# Patient Record
Sex: Female | Born: 1955
Health system: Southern US, Community
[De-identification: ages and names within clinical notes are randomized; demographics above are authoritative.]

## PROBLEM LIST (undated history)

## (undated) DIAGNOSIS — E785 Hyperlipidemia, unspecified: Secondary | ICD-10-CM

## (undated) DIAGNOSIS — M810 Age-related osteoporosis without current pathological fracture: Secondary | ICD-10-CM

## (undated) DIAGNOSIS — M797 Fibromyalgia: Secondary | ICD-10-CM

## (undated) DIAGNOSIS — F319 Bipolar disorder, unspecified: Secondary | ICD-10-CM

## (undated) DIAGNOSIS — E039 Hypothyroidism, unspecified: Secondary | ICD-10-CM

## (undated) DIAGNOSIS — F1111 Opioid abuse, in remission: Secondary | ICD-10-CM

## (undated) DIAGNOSIS — M545 Low back pain, unspecified: Secondary | ICD-10-CM

## (undated) DIAGNOSIS — M069 Rheumatoid arthritis, unspecified: Secondary | ICD-10-CM

## (undated) DIAGNOSIS — F419 Anxiety disorder, unspecified: Secondary | ICD-10-CM

## (undated) DIAGNOSIS — R160 Hepatomegaly, not elsewhere classified: Secondary | ICD-10-CM

## (undated) DIAGNOSIS — G20 Parkinson's disease: Secondary | ICD-10-CM

## (undated) DIAGNOSIS — N200 Calculus of kidney: Secondary | ICD-10-CM

## (undated) DIAGNOSIS — F603 Borderline personality disorder: Secondary | ICD-10-CM

## (undated) DIAGNOSIS — E236 Other disorders of pituitary gland: Secondary | ICD-10-CM

## (undated) DIAGNOSIS — K219 Gastro-esophageal reflux disease without esophagitis: Secondary | ICD-10-CM

## (undated) DIAGNOSIS — M1990 Unspecified osteoarthritis, unspecified site: Secondary | ICD-10-CM

## (undated) DIAGNOSIS — I2510 Atherosclerotic heart disease of native coronary artery without angina pectoris: Secondary | ICD-10-CM

## (undated) DIAGNOSIS — M419 Scoliosis, unspecified: Secondary | ICD-10-CM

## (undated) DIAGNOSIS — F329 Major depressive disorder, single episode, unspecified: Secondary | ICD-10-CM

## (undated) DIAGNOSIS — I10 Essential (primary) hypertension: Secondary | ICD-10-CM

## (undated) HISTORY — PX: BLADDER SURGERY: SHX569

## (undated) HISTORY — DX: Essential (primary) hypertension: I10

## (undated) HISTORY — DX: Atherosclerotic heart disease of native coronary artery without angina pectoris: I25.10

## (undated) HISTORY — DX: Low back pain: M54.5

## (undated) HISTORY — DX: Hyperlipidemia, unspecified: E78.5

## (undated) HISTORY — PX: OTHER SURGICAL HISTORY: SHX169

## (undated) HISTORY — DX: Other disorders of pituitary gland: E23.6

## (undated) HISTORY — PX: TONSILLECTOMY: SUR1361

## (undated) HISTORY — DX: Scoliosis, unspecified: M41.9

## (undated) HISTORY — DX: Age-related osteoporosis without current pathological fracture: M81.0

## (undated) HISTORY — DX: Calculus of kidney: N20.0

## (undated) HISTORY — DX: Opioid abuse, in remission: F11.11

## (undated) HISTORY — PX: ESOPHAGEAL DILATION: SHX303

## (undated) HISTORY — DX: Hepatomegaly, not elsewhere classified: R16.0

## (undated) HISTORY — DX: Fibromyalgia: M79.7

## (undated) HISTORY — PX: CARDIAC CATHETERIZATION: SHX172

## (undated) HISTORY — DX: Hypothyroidism, unspecified: E03.9

## (undated) HISTORY — PX: VAGINAL HYSTERECTOMY: SUR661

## (undated) HISTORY — DX: Gastro-esophageal reflux disease without esophagitis: K21.9

## (undated) HISTORY — PX: COLONOSCOPY: SHX174

## (undated) HISTORY — DX: Anxiety disorder, unspecified: F41.9

## (undated) HISTORY — DX: Rheumatoid arthritis, unspecified: M06.9

## (undated) HISTORY — DX: Low back pain, unspecified: M54.50

## (undated) HISTORY — DX: Major depressive disorder, single episode, unspecified: F32.9

## (undated) HISTORY — DX: Bipolar disorder, unspecified: F31.9

## (undated) HISTORY — PX: CATARACT EXTRACTION, BILATERAL: SHX1313

## (undated) HISTORY — DX: Unspecified osteoarthritis, unspecified site: M19.90

## (undated) HISTORY — DX: Parkinson's disease: G20

## (undated) HISTORY — DX: Borderline personality disorder: F60.3

---

## 2001-05-18 ENCOUNTER — Encounter (INDEPENDENT_AMBULATORY_CARE_PROVIDER_SITE_OTHER): Payer: Self-pay | Admitting: Specialist

## 2001-05-18 ENCOUNTER — Observation Stay (HOSPITAL_COMMUNITY): Admission: RE | Admit: 2001-05-18 | Discharge: 2001-05-19 | Payer: Self-pay | Admitting: *Deleted

## 2003-02-28 ENCOUNTER — Other Ambulatory Visit: Payer: Self-pay

## 2003-04-06 ENCOUNTER — Inpatient Hospital Stay (HOSPITAL_COMMUNITY): Admission: AD | Admit: 2003-04-06 | Discharge: 2003-04-08 | Payer: Self-pay | Admitting: Cardiology

## 2003-09-12 ENCOUNTER — Inpatient Hospital Stay (HOSPITAL_COMMUNITY): Admission: RE | Admit: 2003-09-12 | Discharge: 2003-09-20 | Payer: Self-pay | Admitting: Psychiatry

## 2003-09-21 ENCOUNTER — Other Ambulatory Visit (HOSPITAL_COMMUNITY): Admission: RE | Admit: 2003-09-21 | Discharge: 2003-12-20 | Payer: Self-pay | Admitting: Psychiatry

## 2003-10-09 ENCOUNTER — Inpatient Hospital Stay (HOSPITAL_COMMUNITY): Admission: RE | Admit: 2003-10-09 | Discharge: 2003-10-15 | Payer: Self-pay | Admitting: Psychiatry

## 2004-04-12 ENCOUNTER — Ambulatory Visit (HOSPITAL_COMMUNITY): Admission: RE | Admit: 2004-04-12 | Discharge: 2004-04-12 | Payer: Self-pay

## 2004-05-06 ENCOUNTER — Ambulatory Visit (HOSPITAL_COMMUNITY): Admission: RE | Admit: 2004-05-06 | Discharge: 2004-05-06 | Payer: Self-pay | Admitting: *Deleted

## 2004-06-10 ENCOUNTER — Inpatient Hospital Stay: Payer: Self-pay | Admitting: Anesthesiology

## 2004-06-10 ENCOUNTER — Other Ambulatory Visit: Payer: Self-pay

## 2004-08-23 ENCOUNTER — Emergency Department: Payer: Self-pay | Admitting: General Practice

## 2005-08-12 ENCOUNTER — Other Ambulatory Visit: Payer: Self-pay

## 2005-08-13 ENCOUNTER — Inpatient Hospital Stay: Payer: Self-pay

## 2005-09-01 ENCOUNTER — Ambulatory Visit: Payer: Self-pay | Admitting: Gastroenterology

## 2005-09-24 ENCOUNTER — Encounter: Payer: Self-pay | Admitting: Internal Medicine

## 2006-10-17 ENCOUNTER — Emergency Department: Payer: Self-pay | Admitting: Emergency Medicine

## 2007-10-21 ENCOUNTER — Emergency Department: Payer: Self-pay | Admitting: Emergency Medicine

## 2008-05-01 ENCOUNTER — Encounter: Payer: Self-pay | Admitting: Family Medicine

## 2008-05-24 ENCOUNTER — Ambulatory Visit: Payer: Self-pay | Admitting: Cardiology

## 2008-06-09 ENCOUNTER — Encounter: Payer: Self-pay | Admitting: Family Medicine

## 2008-06-22 ENCOUNTER — Encounter: Payer: Self-pay | Admitting: Family Medicine

## 2008-06-29 ENCOUNTER — Encounter: Payer: Self-pay | Admitting: Family Medicine

## 2008-07-25 ENCOUNTER — Encounter: Payer: Self-pay | Admitting: Family Medicine

## 2008-08-31 ENCOUNTER — Encounter: Payer: Self-pay | Admitting: Family Medicine

## 2008-10-25 ENCOUNTER — Emergency Department (HOSPITAL_COMMUNITY): Admission: EM | Admit: 2008-10-25 | Discharge: 2008-10-25 | Payer: Self-pay | Admitting: Emergency Medicine

## 2008-10-28 ENCOUNTER — Inpatient Hospital Stay: Payer: Self-pay | Admitting: Unknown Physician Specialty

## 2009-02-03 ENCOUNTER — Ambulatory Visit: Payer: Self-pay | Admitting: Gastroenterology

## 2009-03-06 ENCOUNTER — Ambulatory Visit: Payer: Self-pay | Admitting: Gastroenterology

## 2009-06-09 ENCOUNTER — Encounter: Payer: Self-pay | Admitting: Family Medicine

## 2009-06-26 ENCOUNTER — Encounter: Payer: Self-pay | Admitting: Family Medicine

## 2009-08-24 ENCOUNTER — Encounter: Payer: Self-pay | Admitting: Family Medicine

## 2009-11-26 ENCOUNTER — Ambulatory Visit: Payer: Self-pay | Admitting: Family Medicine

## 2009-11-26 DIAGNOSIS — M545 Low back pain: Secondary | ICD-10-CM

## 2009-11-26 DIAGNOSIS — I2510 Atherosclerotic heart disease of native coronary artery without angina pectoris: Secondary | ICD-10-CM

## 2009-11-26 DIAGNOSIS — F319 Bipolar disorder, unspecified: Secondary | ICD-10-CM | POA: Insufficient documentation

## 2009-11-26 DIAGNOSIS — I10 Essential (primary) hypertension: Secondary | ICD-10-CM | POA: Insufficient documentation

## 2009-11-26 DIAGNOSIS — Z87442 Personal history of urinary calculi: Secondary | ICD-10-CM

## 2009-11-26 DIAGNOSIS — E039 Hypothyroidism, unspecified: Secondary | ICD-10-CM | POA: Insufficient documentation

## 2009-11-26 DIAGNOSIS — E1169 Type 2 diabetes mellitus with other specified complication: Secondary | ICD-10-CM

## 2009-11-26 DIAGNOSIS — E1140 Type 2 diabetes mellitus with diabetic neuropathy, unspecified: Secondary | ICD-10-CM

## 2009-11-26 DIAGNOSIS — F411 Generalized anxiety disorder: Secondary | ICD-10-CM

## 2009-11-26 DIAGNOSIS — E785 Hyperlipidemia, unspecified: Secondary | ICD-10-CM

## 2009-11-26 DIAGNOSIS — F410 Panic disorder [episodic paroxysmal anxiety] without agoraphobia: Secondary | ICD-10-CM

## 2009-11-26 HISTORY — DX: Hypothyroidism, unspecified: E03.9

## 2009-11-26 HISTORY — DX: Bipolar disorder, unspecified: F31.9

## 2009-11-26 HISTORY — DX: Panic disorder (episodic paroxysmal anxiety): F41.0

## 2009-11-26 HISTORY — DX: Atherosclerotic heart disease of native coronary artery without angina pectoris: I25.10

## 2009-11-26 HISTORY — DX: Essential (primary) hypertension: I10

## 2009-11-28 LAB — CONVERTED CEMR LAB
ALT: 10 units/L (ref 0–35)
AST: 11 units/L (ref 0–37)
Albumin: 3.7 g/dL (ref 3.5–5.2)
Alkaline Phosphatase: 89 units/L (ref 39–117)
BUN: 11 mg/dL (ref 6–23)
Bilirubin, Direct: 0.1 mg/dL (ref 0.0–0.3)
CO2: 32 meq/L (ref 19–32)
Calcium: 9.2 mg/dL (ref 8.4–10.5)
Chloride: 98 meq/L (ref 96–112)
Cholesterol: 203 mg/dL — ABNORMAL HIGH (ref 0–200)
Creatinine, Ser: 0.9 mg/dL (ref 0.4–1.2)
Direct LDL: 98.1 mg/dL
GFR calc non Af Amer: 69.61 mL/min (ref >60)
Glucose, Bld: 221 mg/dL — ABNORMAL HIGH (ref 70–99)
HDL: 46.6 mg/dL (ref >39.00)
Hgb A1c MFr Bld: 8.5 % — ABNORMAL HIGH (ref 4.6–6.5)
Potassium: 4.6 meq/L (ref 3.5–5.1)
Sodium: 141 meq/L (ref 135–145)
TSH: 0.03 microintl units/mL — ABNORMAL LOW (ref 0.35–5.50)
Total Bilirubin: 0.4 mg/dL (ref 0.3–1.2)
Total CHOL/HDL Ratio: 4
Total Protein: 6.7 g/dL (ref 6.0–8.3)
Triglycerides: 539 mg/dL — ABNORMAL HIGH (ref 0.0–149.0)
VLDL: 107.8 mg/dL — ABNORMAL HIGH (ref 0.0–40.0)

## 2009-12-20 ENCOUNTER — Telehealth: Payer: Self-pay | Admitting: Family Medicine

## 2010-01-01 ENCOUNTER — Encounter (INDEPENDENT_AMBULATORY_CARE_PROVIDER_SITE_OTHER): Payer: Self-pay | Admitting: *Deleted

## 2010-01-01 ENCOUNTER — Ambulatory Visit: Payer: Self-pay | Admitting: Family Medicine

## 2010-01-01 DIAGNOSIS — R300 Dysuria: Secondary | ICD-10-CM

## 2010-01-01 LAB — CONVERTED CEMR LAB
Bilirubin Urine: NEGATIVE
Blood in Urine, dipstick: NEGATIVE
Glucose, Urine, Semiquant: NEGATIVE
Nitrite: NEGATIVE
Specific Gravity, Urine: 1.01
Urobilinogen, UA: 0.2
WBC Urine, dipstick: NEGATIVE
pH: 8.5

## 2010-01-02 ENCOUNTER — Encounter: Payer: Self-pay | Admitting: Family Medicine

## 2010-01-02 LAB — CONVERTED CEMR LAB
ALT: 14 units/L (ref 0–35)
AST: 15 units/L (ref 0–37)
Albumin: 3.7 g/dL (ref 3.5–5.2)
Alkaline Phosphatase: 77 units/L (ref 39–117)
BUN: 12 mg/dL (ref 6–23)
Bilirubin, Direct: 0.1 mg/dL (ref 0.0–0.3)
CO2: 36 meq/L — ABNORMAL HIGH (ref 19–32)
Calcium: 9.3 mg/dL (ref 8.4–10.5)
Chloride: 96 meq/L (ref 96–112)
Cholesterol: 126 mg/dL (ref 0–200)
Creatinine, Ser: 0.8 mg/dL (ref 0.4–1.2)
Direct LDL: 47.6 mg/dL
GFR calc non Af Amer: 76.49 mL/min (ref >60)
Glucose, Bld: 208 mg/dL — ABNORMAL HIGH (ref 70–99)
HDL: 36.7 mg/dL — ABNORMAL LOW (ref >39.00)
Hgb A1c MFr Bld: 9 % — ABNORMAL HIGH (ref 4.6–6.5)
Potassium: 4 meq/L (ref 3.5–5.1)
Sodium: 143 meq/L (ref 135–145)
TSH: 0.26 microintl units/mL — ABNORMAL LOW (ref 0.35–5.50)
Total Bilirubin: 0.5 mg/dL (ref 0.3–1.2)
Total CHOL/HDL Ratio: 3
Total Protein: 6.6 g/dL (ref 6.0–8.3)
Triglycerides: 394 mg/dL — ABNORMAL HIGH (ref 0.0–149.0)
VLDL: 78.8 mg/dL — ABNORMAL HIGH (ref 0.0–40.0)

## 2010-01-09 ENCOUNTER — Encounter (INDEPENDENT_AMBULATORY_CARE_PROVIDER_SITE_OTHER): Payer: Self-pay | Admitting: *Deleted

## 2010-01-10 ENCOUNTER — Encounter (INDEPENDENT_AMBULATORY_CARE_PROVIDER_SITE_OTHER): Payer: Self-pay | Admitting: *Deleted

## 2010-01-10 ENCOUNTER — Telehealth: Payer: Self-pay | Admitting: Family Medicine

## 2010-01-17 ENCOUNTER — Telehealth: Payer: Self-pay | Admitting: Family Medicine

## 2010-01-28 ENCOUNTER — Telehealth: Payer: Self-pay | Admitting: Family Medicine

## 2010-02-04 ENCOUNTER — Ambulatory Visit: Payer: Self-pay | Admitting: Family Medicine

## 2010-02-18 ENCOUNTER — Encounter (INDEPENDENT_AMBULATORY_CARE_PROVIDER_SITE_OTHER): Payer: Self-pay | Admitting: *Deleted

## 2010-02-21 ENCOUNTER — Ambulatory Visit: Payer: Self-pay | Admitting: Family Medicine

## 2010-02-21 ENCOUNTER — Ambulatory Visit: Payer: Self-pay | Admitting: Internal Medicine

## 2010-02-21 ENCOUNTER — Telehealth: Payer: Self-pay | Admitting: Internal Medicine

## 2010-02-21 DIAGNOSIS — G894 Chronic pain syndrome: Secondary | ICD-10-CM | POA: Insufficient documentation

## 2010-02-21 HISTORY — DX: Chronic pain syndrome: G89.4

## 2010-02-22 ENCOUNTER — Encounter (INDEPENDENT_AMBULATORY_CARE_PROVIDER_SITE_OTHER): Payer: Self-pay | Admitting: *Deleted

## 2010-02-22 ENCOUNTER — Telehealth: Payer: Self-pay | Admitting: Family Medicine

## 2010-02-22 LAB — CONVERTED CEMR LAB
ALT: 18 units/L (ref 0–35)
AST: 19 units/L (ref 0–37)
Albumin: 4.2 g/dL (ref 3.5–5.2)
Alkaline Phosphatase: 48 units/L (ref 39–117)
Bilirubin, Direct: 0.1 mg/dL (ref 0.0–0.3)
Cholesterol: 251 mg/dL — ABNORMAL HIGH (ref 0–200)
Direct LDL: 156.2 mg/dL
HDL: 47.5 mg/dL (ref >39.00)
Hgb A1c MFr Bld: 8 % — ABNORMAL HIGH (ref 4.6–6.5)
Total Bilirubin: 0.6 mg/dL (ref 0.3–1.2)
Total CHOL/HDL Ratio: 5
Total Protein: 7.3 g/dL (ref 6.0–8.3)
Triglycerides: 361 mg/dL — ABNORMAL HIGH (ref 0.0–149.0)
VLDL: 72.2 mg/dL — ABNORMAL HIGH (ref 0.0–40.0)

## 2010-02-27 ENCOUNTER — Telehealth: Payer: Self-pay | Admitting: Family Medicine

## 2010-03-13 ENCOUNTER — Encounter: Payer: Self-pay | Admitting: Internal Medicine

## 2010-03-13 ENCOUNTER — Telehealth: Payer: Self-pay | Admitting: Internal Medicine

## 2010-03-20 ENCOUNTER — Telehealth: Payer: Self-pay | Admitting: Family Medicine

## 2010-04-04 ENCOUNTER — Ambulatory Visit: Payer: Self-pay | Admitting: Internal Medicine

## 2010-04-08 ENCOUNTER — Telehealth: Payer: Self-pay | Admitting: Family Medicine

## 2010-04-18 ENCOUNTER — Telehealth: Payer: Self-pay | Admitting: Family Medicine

## 2010-05-06 ENCOUNTER — Telehealth: Payer: Self-pay | Admitting: Family Medicine

## 2010-05-11 ENCOUNTER — Encounter: Payer: Self-pay | Admitting: Neurology

## 2010-05-21 ENCOUNTER — Telehealth: Payer: Self-pay | Admitting: Family Medicine

## 2010-05-23 NOTE — Letter (Signed)
Summary: [facility]  [facility]   Imported By: Maryln Gottron 02/08/2010 15:19:10  _____________________________________________________________________  External Attachment:    Type:   Image     Comment:   External Document

## 2010-05-23 NOTE — Miscellaneous (Signed)
Summary: Colonoscopy 09/24/2005 1+ adenoma  Clinical Lists Changes  Observations: Added new observation of COLONNXTDUE: 09/2010 (03/13/2010 16:18) Added new observation of COLONRECACT: Repeat colonoscopy in 5 years. Would use deep sedation with propofol   (09/24/2005 16:21) Added new observation of COLONOSCOPY: 4 mm transverse polyp 3 mm sigmoid polyp tortuous colon  at least one polyp was an ADENOMA  175 micrograms Fentanyl 8 mg midazolam  Dr,. Herfarth [facility]  Patient's name Lori Phillips then  (09/24/2005 16:21)      Colonoscopy  Procedure date:  09/24/2005  Findings:      4 mm transverse polyp 3 mm sigmoid polyp tortuous colon  at least one polyp was an ADENOMA  175 micrograms Fentanyl 8 mg midazolam  Dr,. Herf[facility]  Patient's name Lori Phillips then   Comments:      Repeat colonoscopy in 5 years. Would use deep sedation with propofol    Procedures Next Due Date:    Colonoscopy: 09/2010

## 2010-05-23 NOTE — Progress Notes (Signed)
Summary: Rx Hydrocodone/APAP  Phone Note Refill Request Call back at [phone] Message from:  [facility] on April 08, 2010 12:49 PM  Refills Requested: Medication #1:  VICODIN 5-500 MG TABS 1 tab by mouth every 6 hours as needed for severe pain.   Last Refilled: 03/20/2010 Received E-script request please advise.   Method Requested: Telephone to Pharmacy Initial call taken by: Linde Gillis CMA [facility]),  April 08, 2010 12:49 PM  Follow-up for Phone Call        Rx called to pharmacy Follow-up by: Linde Gillis CMA [facility]),  April 09, 2010 8:08 AM    Prescriptions: VICODIN 5-500 MG TABS (HYDROCODONE-ACETAMINOPHEN) 1 tab by mouth every 6 hours as needed for severe pain.  #60 x 0   Entered and Authorized by:   [name] MD   Signed by:   [name] MD on 04/09/2010   Method used:   Telephoned to ...         RxID:   [id number]

## 2010-05-23 NOTE — Progress Notes (Signed)
Summary: Rx Tramadol  Phone Note Refill Request Call back at [phone] Message from:  [facility] on January 28, 2010 8:17 AM  Refills Requested: Medication #1:  TRAMADOL HCL 50 MG  TABS 1-2  tab by mouth 4  times a day as needed pain   Last Refilled: 01/01/2010 Received E-script request please advise.   Method Requested: Telephone to Pharmacy Initial call taken by: Linde Gillis CMA [facility] Dull),  January 28, 2010 8:18 AM    Prescriptions: TRAMADOL HCL 50 MG  TABS (TRAMADOL HCL) 1-2  tab by mouth 4  times a day as needed pain  #240 x 0   Entered and Authorized by:   [name] MD   Signed by:   [name] MD on 01/28/2010   Method used:   Electronically to        [facility]. # [phone]* (retail)       [address]       [place], [place]  [address]       Ph: [phone]       Fax: [phone]   RxID:   [id number]

## 2010-05-23 NOTE — Miscellaneous (Signed)
Summary: Controlled Substances Contract  Controlled Substances Contract   Imported By: Maryln Gottron 02/25/2010 14:11:25  _____________________________________________________________________  External Attachment:    Type:   Image     Comment:   External Document

## 2010-05-23 NOTE — Procedures (Signed)
Summary: Colonoscopy/Hans Herfarth MD -[facility]  Colonoscopy/Hans Herfarth MD -[facility]   Imported By: Sherian Rein 03/20/2010 11:24:24  _____________________________________________________________________  External Attachment:    Type:   Image     Comment:   External Document

## 2010-05-23 NOTE — Letter (Signed)
Summary: Pre Visit Letter Revised  [facility]  [address] [place], [place] [address]   Phone: [phone]  Fax: [phone]        01/01/2010 MRN: [id number] [facility] [address] [place], [place]  [address]                Procedure Date:  01-23-10  Welcome to the Gastroenterology Division at [facility].    You are scheduled to see a nurse for your pre-procedure visit on 01-09-10 at 10:00a.m. on the 3rd floor at [facility], [address]  We ask that you try to arrive at our office 15 minutes prior to your appointment time to allow for check-in.  Please take a minute to review the attached form.  If you answer "Yes" to one or more of the questions on the first page, we ask that you call the person listed at your earliest opportunity.  If you answer "No" to all of the questions, please complete the rest of the form and bring it to your appointment.    Your nurse visit will consist of discussing your medical and surgical history, your immediate family medical history, and your medications.   If you are unable to list all of your medications on the form, please bring the medication bottles to your appointment and we will list them.  We will need to be aware of both prescribed and over the counter drugs.  We will need to know exact dosage information as well.    Please be prepared to read and sign documents such as consent forms, a financial agreement, and acknowledgement forms.  If necessary, and with your consent, a friend or relative is welcome to sit-in on the nurse visit with you.  Please bring your insurance card so that we may make a copy of it.  If your insurance requires a referral to see a specialist, please bring your referral form from your primary care physician.  No co-pay is required for this nurse visit.     If you cannot keep your appointment, please call [phone] to cancel or reschedule prior to your appointment date.  This  allows [place] the opportunity to schedule an appointment for another patient in need of care.    Thank you for choosing [facility] for your medical needs.  We appreciate the opportunity to care for you.  Please visit [place] at our website  to learn more about our practice.  Sincerely, The Gastroenterology Division

## 2010-05-23 NOTE — Letter (Signed)
Summary: [facility] Instructions  [facility]  [address] [place], [place] [address]   Phone: [phone]  Fax: [phone]       Lori Phillips    [birth year]    MRN: [id number]        Procedure Day /Date:  Thursday 04-07-10     Arrival Time:  10:30 am      Procedure Time: 11:30 am     Location of Procedure:                    _x _  [facility] (4th Floor)                        PREPARATION FOR COLONOSCOPY WITH MOVIPREP   Starting 5 days prior to your procedure 03-30-10 do not eat nuts, seeds, popcorn, corn, beans, peas,  salads, or any raw vegetables.  Do not take any fiber supplements (e.g. Metamucil, Citrucel, and Benefiber).  THE DAY BEFORE YOUR PROCEDURE         DATE: Wednesday 04-03-10  1.  Drink clear liquids the entire day-NO SOLID FOOD  2.  Do not drink anything colored red or purple.  Avoid juices with pulp.  No orange juice.  3.  Drink at least 64 oz. (8 glasses) of fluid/clear liquids during the day to prevent dehydration and help the prep work efficiently.  CLEAR LIQUIDS INCLUDE: Water Jello Ice Popsicles Tea (sugar ok, no milk/cream) Powdered fruit flavored drinks Coffee (sugar ok, no milk/cream) Gatorade Juice: apple, white grape, white cranberry  Lemonade Clear bullion, consomm, broth Carbonated beverages (any kind) Strained chicken noodle soup Hard Candy                             4.  In the morning, mix first dose of MoviPrep solution:    Empty 1 Pouch A and 1 Pouch B into the disposable container    Add lukewarm drinking water to the top line of the container. Mix to dissolve    Refrigerate (mixed solution should be used within 24 hrs)  5.  Begin drinking the prep at 5:00 p.m. The MoviPrep container is divided by 4 marks.   Every 15 minutes drink the solution down to the next mark (approximately 8 oz) until the full liter is complete.   6.  Follow completed prep with 16 oz of clear liquid of your choice  (Nothing red or purple).  Continue to drink clear liquids until bedtime.  7.  Before going to bed, mix second dose of MoviPrep solution:    Empty 1 Pouch A and 1 Pouch B into the disposable container    Add lukewarm drinking water to the top line of the container. Mix to dissolve    Refrigerate  THE DAY OF YOUR PROCEDURE      DATE: Thursday, 04-04-10  Beginning at 6:30 am (5 hours before procedure):         1. Every 15 minutes, drink the solution down to the next mark (approx 8 oz) until the full liter is complete.  2. Follow completed prep with 16 oz. of clear liquid of your choice.    3. You may drink clear liquids until 9:30 am (2 HOURS BEFORE PROCEDURE).   MEDICATION INSTRUCTIONS  Unless otherwise instructed, you should take regular prescription medications with a small sip of water   as early as possible the morning of  your procedure.  Diabetic patients - see separate instructions.           OTHER INSTRUCTIONS  You will need a responsible adult at least 57 years of age to accompany you and drive you home.   This person must remain in the waiting room during your procedure.  Wear loose fitting clothing that is easily removed.  Leave jewelry and other valuables at home.  However, you may wish to bring a book to read or  an iPod/MP3 player to listen to music as you wait for your procedure to start.  Remove all body piercing jewelry and leave at home.  Total time from sign-in until discharge is approximately 2-3 hours.  You should go home directly after your procedure and rest.  You can resume normal activities the  day after your procedure.  The day of your procedure you should not:   Drive   Make legal decisions   Operate machinery   Drink alcohol   Return to work  You will receive specific instructions about eating, activities and medications before you leave.    The above instructions have been reviewed and explained to me by  Karl Bales  RN  February 21, 2010 11:04 AM     I fully understand and can verbalize these instructions _____________________________ Date _________

## 2010-05-23 NOTE — Miscellaneous (Signed)
Summary: [facility] previsit  Clinical Lists Changes  Medications: Added new medication of MOVIPREP 100 GM  SOLR (PEG-KCL-NACL-NASULF-NA ASC-C) As per prep instructions. - Signed Rx of MOVIPREP 100 GM  SOLR (PEG-KCL-NACL-NASULF-NA ASC-C) As per prep instructions.;  #1 x 0;  Signed;  Entered by: Karl Bales RN;  Authorized by: Iva Boop MD, [facility];  Method used: Electronically to [facility]. # [phone]*, [address] [place], [place], [place]  [address], Ph: [phone], Fax: [phone] Allergies: Added new allergy or adverse reaction of * SEROQUEL    Prescriptions: MOVIPREP 100 GM  SOLR (PEG-KCL-NACL-NASULF-NA ASC-C) As per prep instructions.  #1 x 0   Entered by:   Karl Bales RN   Authorized by:   Iva Boop MD, [facility]   Signed by:   Karl Bales RN on 02/21/2010   Method used:   Electronically to        [facility]. # [phone]* (retail)       [address]       [place], [place]  [address]       Ph: [phone]       Fax: [phone]   RxID:   [id number]

## 2010-05-23 NOTE — Progress Notes (Signed)
Summary: refill request for tramadol  Phone Note Refill Request Message from:  Fax from Pharmacy  Refills Requested: Medication #1:  TRAMADOL HCL 50 MG  TABS 1-2  tab by mouth 4  times a day as needed pain   Last Refilled: 01/28/2010 [facility]axed [facility] from [facility], [phone].  Initial call taken by: Lowella Petties CMA, AAMA,  February 27, 2010 4:47 PM    Prescriptions: TRAMADOL HCL 50 MG  TABS (TRAMADOL HCL) 1-2  tab by mouth 4  times a day as needed pain  #240 x 0   Entered and Authorized by:   [name] MD   Signed by:   [name] MD on 02/28/2010   Method used:   Electronically to        [facility]. # [phone]* (retail)       [address]       [place], [place]  [address]       Ph: [phone]       Fax: [phone]   RxID:   [id number] TRAMADOL HCL 50 MG  TABS (TRAMADOL HCL) 1-2  tab by mouth 4  times a day as needed pain  #240 x 0   Entered and Authorized by:   [name] MD   Signed by:   [name] MD on 02/28/2010   Method used:   Telephoned to ...         RxID:   [id number]

## 2010-05-23 NOTE — Letter (Signed)
Summary: Diabetic Instructions  [facility]  [address] [place], [place] [address]   Phone: [phone]  Fax: [phone]    Lori Phillips Jul 09, 1955 MRN: [id number]   x     ORAL DIABETIC MEDICATION INSTRUCTIONS  The day before your procedure:   Take your diabetic pill as you do normally  The day of your procedure:   Do not take your diabetic pill    We will check your blood sugar levels during the admission process and again in Recovery before discharging you home  ________________________________________________________________________  x     INSULIN (LONG ACTING) MEDICATION INSTRUCTIONS (Lantus, NPH, 70/30, Humulin, Novolin-N)   The day before your procedure:   Take  your regular evening dose    The day of your procedure:   Do not take your morning dose

## 2010-05-23 NOTE — Letter (Signed)
Summary: Pre Visit No Show Letter  [facility]  [address] [place], [place] [address]   Phone: [phone]  Fax: [phone]        January 09, 2010 MRN: [id number]    [facility] [address] [place], [place]  [address]    Dear Lori Phillips,   We have been unable to reach you by phone concerning the pre-procedure visit that you missed on 01/09/10. For this reason,your procedure scheduled on 01/23/10 has been cancelled. Our scheduling staff will gladly assist you with rescheduling your appointments at a more convenient time. Please call our office at [phone] between the hours of 8:00am and 5:00pm, press option #2 to reach an appointment scheduler. Please consider updating your contact numbers at this time so that we can reach you by phone in the future with schedule changes or results.    Thank you,    Wyona Almas RN [facility]

## 2010-05-23 NOTE — Assessment & Plan Note (Signed)
Summary: NEW MEDICARE PT TO ESTABH/DLO   Vital Signs:  Patient profile:   55 year old female Height:      66.5 inches Weight:      191.25 pounds BMI:     30.52 Temp:     97.7 degrees F oral Pulse rate:   72 / minute Pulse rhythm:   regular BP sitting:   120 / 82  (left arm) Cuff size:   regular  Vitals Entered By: Linde Gillis CMA [facility]) (November 26, 2009 9:42 AM) CC: new medicare to establish  Vision Screening:      Vision Comments: wears glasses  Vision Entered By: Linde Gillis CMA (AAMA) (November 26, 2009 10:00 AM)  Hearing Screen  20db HL: Left  500 hz: 20db 1000 hz: 20db 2000 hz: 20db 4000 hz: 20db Right  500 hz: 20db 1000 hz: 20db 2000 hz: 20db 4000 hz: 20db   Hearing Testing Entered By: Linde Gillis CMA [facility]ull) (November 26, 2009 10:00 AM)   History of Present Illness: 55 yo here to establish care.  DM- does not check her CBGs regulary.  Currently on Glipize-Metformin 2.5/500 mg- 2 tabs two times a day.  Denies any episodes of hypoglycemic symptoms.  Unsure when last a1c was but brings in labs from 06/2009 when a1c was 9.0.  Hypothyroidism- on Levothyroxiine 250 micrograms daily.  Denies any symptoms of hypo or hypothyroidism,  TSH on March 2011 was 0.815.  HTN- has been well controlled on Lisinopril 5 mg daily and Diltiazam 180 mg at bedtime.   ?h/o CAD- has had multiple cardiac catheterizations which have been negative.  Has been placed on diltiazem to help with possible spasm, which she feels has helped.  Denies any recent episodes of CP or SOB>  Bipolar disorder- well controlled on Depadoate ER 500 mg two times a day. Has not had a manic episodes in 4-5 years and has not seen a psychiatrist since her last manic episode.  Fibromyalgia- on Cymbalta 60 mg daily.  Still in constant pain- bilateral arms, legs shoulders.    HLD- has been on Lipitor 80 mg for years.  Unsure when she last had her lipids checked.  In 06/2009, TG were 505 and therefore LDL was not  able to be calculated.  Low back pain- was being seen at pain clinic (awaiting records), says they told her they could not help her anymore.  Preventive Screening-Counseling & Management      Drug Use:  no.    Current Medications (verified): 1)  Glipizide-Metformin Hcl 2.5-500 Mg Tabs (Glipizide-Metformin Hcl) .... Take One Tablet By Mouth Two Times A Day With Food 2)  Depakote Er 500 Mg Xr24h-Tab (Divalproex Sodium) .... Take One Tablet By Mouth Two Times A Day 3)  Lisinopril 5 Mg Tabs (Lisinopril) .... Take One Tablet By Mouth Daily 4)  Cymbalta 60 Mg Cpep (Duloxetine Hcl) .... Take One Tablet By Mouth Daily 5)  Pantoprazole Sodium 40 Mg Tbec (Pantoprazole Sodium) .... Take One Tablet By Mouth Daily 6)  Levothyroxine Sodium 175 Mcg Tabs (Levothyroxine Sodium) .... Take One Tablet Daily 30 Minutes Before Breakfast 7)  Levothyroxine Sodium 75 Mcg Tabs (Levothyroxine Sodium) .... Take One Tablet By Mouth Every Morning Before A Meal 8)  Diltiazem Hcl Er Beads 180 Mg Xr24h-Cap (Diltiazem Hcl Er Beads) .... Take One Tablet By Mouth At Bedtime 9)  Crestor 20 Mg Tabs (Rosuvastatin Calcium) .Marland Kitchen.. 1 Tablet By Mouth Daily 10)  Tramadol Hcl 50 Mg  Tabs (Tramadol  Hcl) .... 1-2  Tab By Mouth Two Times A Day As Needed Pain  Allergies (verified): 1)  ! * Byetta  Past History:  Family History: Last updated: 11/26/2009 Dad- bipolar disorder, committed suicide Mom - HTN  Social History: Last updated: 11/26/2009 On disabiltiy for bipolar disorder and chronic pain. Widow/Widower Alcohol use-yes Drug use-no  Past Medical History: Coronary artery disease ? s.p multiple caths, always negative ([place]). Diabetes mellitus, type II Hyperlipidemia Hypertension Hypothyroidism Low back pain (on disability) Nephrolithiasis, hx of Fibromyalgia bipolar disorder Anxiety  Past Surgical History: Hysterectomy Tonsillectomy  Family History: Dad- bipolar disorder, committed suicide Mom -  HTN  Social History: On disabiltiy for bipolar disorder and chronic pain. Widow/Widower Alcohol use-yes Drug use-no Drug Use:  no  Review of Systems      See HPI General:  Denies fever and malaise. Eyes:  Denies blurring. ENT:  Denies difficulty swallowing. CV:  Denies chest pain or discomfort and shortness of breath with exertion. Resp:  Denies shortness of breath. GI:  Denies abdominal pain and change in bowel habits. GU:  Denies discharge and dysuria. MS:  Complains of muscle aches; denies joint pain, joint redness, joint swelling, and muscle weakness. Derm:  Denies rash. Neuro:  Denies headaches and visual disturbances. Psych:  Complains of anxiety and mental problems; denies depression, easily angered, easily tearful, sense of great danger, suicidal thoughts/plans, thoughts of violence, unusual visions or sounds, and thoughts /plans of harming others. Endo:  Denies cold intolerance and heat intolerance. Heme:  Denies abnormal bruising and bleeding.  Physical Exam  General:  alert, well-developed, and well-nourished.   Head:  normocephalic and atraumatic.   Eyes:  vision grossly intact, pupils equal, pupils round, and pupils reactive to light.   Ears:  R ear normal and L ear normal.   Nose:  no external deformity.   Mouth:  Oral mucosa and oropharynx without lesions or exudates.  Teeth in good repair. Neck:  No deformities, masses, or tenderness noted. Lungs:  Normal respiratory effort, chest expands symmetrically. Lungs are clear to auscultation, no crackles or wheezes. Heart:  Normal rate and regular rhythm. S1 and S2 normal without gallop, murmur, click, rub or other extra sounds. Abdomen:  Bowel sounds positive,abdomen soft and non-tender without masses, organomegaly or hernias noted. Msk:  No deformity or scoliosis noted of thoracic or lumbar spine.   Extremities:  no edema. Neurologic:  alert & oriented X3 and gait normal.   Skin:  Intact without suspicious lesions  or rashes Psych:  Cognition and judgment appear intact. Alert and cooperative with normal attention span and concentration. No apparent delusions, illusions, hallucinations   Impression & Recommendations:  Problem # 1:  HYPERTENSION (ICD-401.9) Assessment Unchanged Stable on current meds. Her updated medication list for this problem includes:    Lisinopril 5 Mg Tabs (Lisinopril) .Marland Kitchen... Take one tablet by mouth daily    Diltiazem Hcl Er Beads 180 Mg Xr24h-cap (Diltiazem hcl er beads) .Marland Kitchen... Take one tablet by mouth at bedtime  Orders: Venipuncture (04540) TLB-BMP (Basic Metabolic Panel-BMET) (80048-METABOL)  Problem # 2:  HYPERLIPIDEMIA (ICD-272.4) Assessment: Deteriorated Appears to be very poorly controlled on high dose Lipitor.  Lipitor 80 mg not very affective and likely worsening her muscle aches.  Will change to Crestor and follow up in 3 months.  Check FLP and hepatic panel today. Her updated medication list for this problem includes:    Crestor 20 Mg Tabs (Rosuvastatin calcium) .Marland Kitchen... 1 tablet by mouth daily  Orders: Venipuncture (98119)  TLB-Lipid Panel (80061-LIPID)  Problem # 3:  HYPOTHYROIDISM (ICD-244.9) Assessment: Unchanged Stable on current meds, check TSH today. Her updated medication list for this problem includes:    Levothyroxine Sodium 175 Mcg Tabs (Levothyroxine sodium) .Marland Kitchen... Take one tablet daily 30 minutes before breakfast    Levothyroxine Sodium 75 Mcg Tabs (Levothyroxine sodium) .Marland Kitchen... Take one tablet by mouth every morning before a meal  Orders: Venipuncture ([address]) TLB-TSH (Thyroid Stimulating Hormone) (84443-TSH)  Problem # 4:  DIABETES MELLITUS, TYPE II (ICD-250.00) Assessment: Unchanged appears to be poorly controlled.  check a1c today, will likely need to increase meds, consider insulin.  Has allergy to Byetta. Her updated medication list for this problem includes:    Glipizide-metformin Hcl 2.5-500 Mg Tabs (Glipizide-metformin hcl) .Marland Kitchen... Take one  tablet by mouth two times a day with food    Lisinopril 5 Mg Tabs (Lisinopril) .Marland Kitchen... Take one tablet by mouth daily  Orders: Venipuncture ([address]) TLB-A1C / Hgb A1C (Glycohemoglobin) (83036-A1C)  Problem # 5:  CORONARY ARTERY DISEASE (ICD-414.00) Assessment: Unchanged appears to have been cleared from cardiology standpoint, likely related to anxiety.  Will await records. Her updated medication list for this problem includes:    Lisinopril 5 Mg Tabs (Lisinopril) .Marland Kitchen... Take one tablet by mouth daily    Diltiazem Hcl Er Beads 180 Mg Xr24h-cap (Diltiazem hcl er beads) .Marland Kitchen... Take one tablet by mouth at bedtime  Orders: Venipuncture (09811)  Complete Medication List: 1)  Glipizide-metformin Hcl 2.5-500 Mg Tabs (Glipizide-metformin hcl) .... Take one tablet by mouth two times a day with food 2)  Depakote Er 500 Mg Xr24h-tab (Divalproex sodium) .... Take one tablet by mouth two times a day 3)  Lisinopril 5 Mg Tabs (Lisinopril) .... Take one tablet by mouth daily 4)  Cymbalta 60 Mg Cpep (Duloxetine hcl) .... Take one tablet by mouth daily 5)  Pantoprazole Sodium 40 Mg Tbec (Pantoprazole sodium) .... Take one tablet by mouth daily 6)  Levothyroxine Sodium 175 Mcg Tabs (Levothyroxine sodium) .... Take one tablet daily 30 minutes before breakfast 7)  Levothyroxine Sodium 75 Mcg Tabs (Levothyroxine sodium) .... Take one tablet by mouth every morning before a meal 8)  Diltiazem Hcl Er Beads 180 Mg Xr24h-cap (Diltiazem hcl er beads) .... Take one tablet by mouth at bedtime 9)  Crestor 20 Mg Tabs (Rosuvastatin calcium) .Marland Kitchen.. 1 tablet by mouth daily 10)  Tramadol Hcl 50 Mg Tabs (Tramadol hcl) .Marland Kitchen.. 1-2  tab by mouth two times a day as needed pain  Other Orders: TLB-Hepatic/Liver Function Pnl (80076-HEPATIC)  Patient Instructions: 1)  Nice to meet you, Lori Phillips. 2)  Please stop taking lipitor and start taking Crestor. 3)  Make an appointment to come see me in 3 months for your medicare annual  wellness visit (45 minutes). Prescriptions: LISINOPRIL 5 MG TABS (LISINOPRIL) take one tablet by mouth daily  #60 x 6   Entered and Authorized by:   [name] MD   Signed by:   [name] MD on 11/26/2009   Method used:   Electronically to        [facility]. # [phone]* (retail)       [address]       [place], [place]  [address]       Ph: [phone]       Fax: [phone]   RxID:   [id number] TRAMADOL HCL 50 MG  TABS (TRAMADOL HCL) 1-2  tab by mouth two times  a day as needed pain  #120 x 0   Entered and Authorized by:   [name] MD   Signed by:   [name] MD on 11/26/2009   Method used:   Electronically to        [facility]. # [phone]* (retail)       [address]       [place], [place]  [address]       Ph: [phone]       Fax: [phone]   RxID:   [id number] CYMBALTA 60 MG CPEP (DULOXETINE HCL) take one tablet by mouth daily  #30 x 6   Entered and Authorized by:   [name] MD   Signed by:   [name] MD on 11/26/2009   Method used:   Electronically to        [facility]. # [phone]* (retail)       [address]       [place], [place]  [address]       Ph: [phone]       Fax: [phone]   RxID:   [id number] DEPAKOTE ER 500 MG XR24H-TAB (DIVALPROEX SODIUM) take one tablet by mouth two times a day  #60 x 6   Entered and Authorized by:   [name] MD   Signed by:   [name] MD on 11/26/2009   Method used:   Electronically to        [facility]. # [phone]* (retail)       [address]       [place], [place]  [address]       Ph: [phone]       Fax: [phone]   RxID:   [id number] CRESTOR 20 MG TABS (ROSUVASTATIN CALCIUM) 1 tablet by mouth daily  #90 x 3   Entered and Authorized by:   [name] MD   Signed by:   [name] MD on 11/26/2009   Method used:   Electronically to        [facility]. # [phone]* (retail)       [address]       [place], [place]  [address]       Ph: [phone]       Fax: [phone]   RxID:   [id number]   Current Allergies (reviewed today): ! * BYETTA

## 2010-05-23 NOTE — Letter (Signed)
Summary: Request for [facility]  [address] [place], [place] [address]   Phone: [phone]  Fax: [phone]    02/22/2010  TO:   [facility]   [phone] (F)  FROM:   Francee Piccolo, CMA (AAMA)   [phone] (F)  RE:   Lori Phillips   AKA: Lori Phillips   [address]   [place], [place]  [address]   DOB: 11/06/1955  TO WHOM IT MAY CONCERN:  Please fax any and all records regarding Colonoscopy, EGD, or Pathology reports for this patient.  [name] believes she had a colonoscopy in 2004 or 2005 in your facility.  Please call me if you need any further information.  Please fax information to [phone].  This is a Biomedical scientist station fax number.  Thank you for your help,     Francee Piccolo CMA [facility])   Appended Document: Generic Letter letter printed and faxed.  Appended Document: Generic Letter no records found on the patient at this facility.

## 2010-05-23 NOTE — Progress Notes (Signed)
Summary: increase tramadol dose?  Phone Note Refill Request Call back at [phone] Message from:  Patient  Refills Requested: Medication #1:  TRAMADOL HCL 50 MG  TABS 1-2  tab by mouth two times a day as needed pain. Pt has been taking 2 of these daily, per Dr. Elmer Sow instructions, because one doesnt help her pain.  She is asking if she can take 3 a day.  She will need a refill called to [facility] in [place].  Initial call taken by: Lowella Petties CMA,  December 20, 2009 2:45 PM  Follow-up for Phone Call        ok to take 4 times daily as needed  Follow-up by: Hannah Beat MD,  December 20, 2009 3:04 PM  Additional Follow-up for Phone Call Additional follow up Details #1::        Rx called to pharmacy and patient notified Additional Follow-up by: Benny Lennert CMA [facility]),  December 20, 2009 3:45 PM    New/Updated Medications: TRAMADOL HCL 50 MG  TABS (TRAMADOL HCL) 1-2  tab by mouth 4  times a day as needed pain Prescriptions: TRAMADOL HCL 50 MG  TABS (TRAMADOL HCL) 1-2  tab by mouth 4  times a day as needed pain  #90 x 0   Entered by:   Benny Lennert CMA (AAMA)   Authorized by:   Hannah Beat MD   Signed by:   Benny Lennert CMA (AAMA) on 12/20/2009   Method used:   Electronically to        [facility]. # [phone]* (retail)       [address]       [place], [place]  [address]       Ph: [phone]       Fax: [phone]   RxID:   [id number]

## 2010-05-23 NOTE — Progress Notes (Signed)
Summary: Vicodin refill  Phone Note Refill Request Message from:  [employer] on May 06, 2010 8:39 AM  Refills Requested: Medication #1:  VICODIN 5-500 MG TABS 1 tab by mouth every 6 hours as needed for severe pain. Refill request from pharmacy. Ok to refill?   Method Requested: Telephone to Pharmacy Initial call taken by: Janee Morn CMA [facility]),  May 06, 2010 8:40 AM  Follow-up for Phone Call        Called into [facility] [address] as directed.  Follow-up by: Janee Morn CMA [facility]),  May 06, 2010 10:43 AM    Prescriptions: VICODIN 5-500 MG TABS (HYDROCODONE-ACETAMINOPHEN) 1 tab by mouth every 6 hours as needed for severe pain.  #60 x 0   Entered and Authorized by:   [name] MD   Signed by:   [name] MD on 05/06/2010   Method used:   Telephoned to ...         RxID:   [id number]

## 2010-05-23 NOTE — Progress Notes (Signed)
Summary: Rx Tramadol  Phone Note Refill Request Call back at [phone] Message from:  [facility] on April 18, 2010 1:21 PM  Refills Requested: Medication #1:  TRAMADOL HCL 50 MG  TABS 1-2  tab by mouth 4  times a day as needed pain   Last Refilled: 02/28/2010 Received E-script request please advise.   Method Requested: Electronic Initial call taken by: Linde Gillis CMA [facility]),  April 18, 2010 1:21 PM    Prescriptions: TRAMADOL HCL 50 MG  TABS (TRAMADOL HCL) 1-2  tab by mouth 4  times a day as needed pain  #240 x 0   Entered and Authorized by:   [name] MD   Signed by:   [name] MD on 04/18/2010   Method used:   Electronically to        [facility]. # [phone]* (retail)       [address]       [place], [place]  [address]       Ph: [phone]       Fax: [phone]   RxID:   [id number]

## 2010-05-23 NOTE — Letter (Signed)
Summary: [facility]  [facility]   Imported By: Maryln Gottron 02/08/2010 15:20:23  _____________________________________________________________________  External Attachment:    Type:   Image     Comment:   External Document

## 2010-05-23 NOTE — Letter (Signed)
Summary: [facility]  [facility]   Imported By: Maryln Gottron 02/08/2010 15:07:50  _____________________________________________________________________  External Attachment:    Type:   Image     Comment:   External Document

## 2010-05-23 NOTE — Letter (Signed)
Summary: Records from [facility] 2956 - 2011  Records from [facility] 2130 - 2011   Imported By: Maryln Gottron 12/04/2009 13:15:34  _____________________________________________________________________  External Attachment:    Type:   Image     Comment:   External Document

## 2010-05-23 NOTE — Progress Notes (Signed)
Summary: blood sugar readings   Phone Note Call from Patient Call back at [phone]   Caller: Patient Call For: [name] MD Summary of Call: Blood sugar readings for the 19 through the 21st- 15th- 216 in am and 188 in  pm the 16th 189 in am and 187 in pm 17th 169 in am and 174 in pm, 18th 190 in am and 165 in pm, 19th 156 in am and 187pm 20th  128am and  129 in pm, 21st  156 in am and 135 in pm and this morning it was 159. Initial call taken by: Melody Comas,  January 10, 2010 1:11 PM  Follow-up for Phone Call        Still a little higher than I would like to see.  Please ask her to increase her Lantus by 3 units, should be 13 units at bedtime.  I will change in med list.  Let me know if she needs refills. [name] MD  January 10, 2010 1:42 PM  Left message on cell phone voicemail for patient to return call.  Linde Gillis CMA [facility])  January 10, 2010 3:17 PM   Patient advised as instructed via telephone.  Advised her to call us and give BS reading as before. Follow-up by: Linde Gillis CMA [facility] Dull),  January 10, 2010 4:59 PM    New/Updated Medications: LANTUS SOLOSTAR 100 UNIT/ML  SOLN (INSULIN GLARGINE) 13 units at bedtime.

## 2010-05-23 NOTE — Assessment & Plan Note (Signed)
Summary: F/U PER DR ARON/NT   Vital Signs:  Patient profile:   55 year old female Height:      66.5 inches Weight:      182 pounds BMI:     29.04 Temp:     97.7 degrees F oral Pulse rate:   72 / minute Pulse rhythm:   regular BP sitting:   120 / 70  (left arm) Cuff size:   regular  Vitals Entered By: Linde Gillis CMA Duncan Dull) (January 01, 2010 9:03 AM) CC: follow-up visit, 30 minute appt   History of Present Illness: 55 yo here to follow up several issues.    DM- does not check her CBGs regulary.  Currently on Glipize-Metformin 2.5/500 mg- 2 tabs two times a day.  Denies any episodes of hypoglycemic symptoms. a1c last month was 8.5%, trying to follow eat right diet.    Hypothyroidism- was on  Levothyroxiine 250 micrograms daily.  Last month, TSH was 0.03, we cut her back to 175 mc daily.  Feels less shaky, less diarrhea.  Denies palpitations or hot/cold intolerance.  HTN- has been well controlled on Lisinopril 5 mg daily and Diltiazam 180 mg at bedtime.   HLD- had  been on Lipitor 80 mg for years.  Last month, we switched to Crestor 20 mg daily, TG was 539!!!  LDL 98.  Dysuria- last few days, increased dysuria and suprapubic pressure.  No hematuria, fevers, nausea or vomiting.  Does have a h/o kidney stones.    Current Medications (verified): 1)  Glipizide-Metformin Hcl 2.5-500 Mg Tabs (Glipizide-Metformin Hcl) .... Take Two  Tablet By Mouth Two Times A Day With Food 2)  Depakote Er 500 Mg Xr24h-Tab (Divalproex Sodium) .... Take One Tablet By Mouth Two Times A Day 3)  Lisinopril 5 Mg Tabs (Lisinopril) .... Take One Tablet By Mouth Daily 4)  Cymbalta 60 Mg Cpep (Duloxetine Hcl) .... Take One Tablet By Mouth Daily 5)  Pantoprazole Sodium 40 Mg Tbec (Pantoprazole Sodium) .... Take One Tablet By Mouth Daily 6)  Levothyroxine Sodium 175 Mcg Tabs (Levothyroxine Sodium) .... Take One Tablet Daily 30 Minutes Before Breakfast 7)  Diltiazem Hcl Er Beads 180 Mg Xr24h-Cap (Diltiazem Hcl Er  Beads) .... Take One Tablet By Mouth At Bedtime 8)  Crestor 20 Mg Tabs (Rosuvastatin Calcium) .Marland Kitchen.. 1 Tablet By Mouth Daily 9)  Tramadol Hcl 50 Mg  Tabs (Tramadol Hcl) .Marland Kitchen.. 1-2  Tab By Mouth 4  Times A Day As Needed Pain 10)  Womens Multivitamin Plus  Tabs (Multiple Vitamins-Minerals) .... Take One Tablet By Mouth Daily  Allergies: 1)  ! * Byetta  Past History:  Past Medical History: Last updated: 11/26/2009 Coronary artery disease ? s.p multiple caths, always negative ([facility]). Diabetes mellitus, type II Hyperlipidemia Hypertension Hypothyroidism Low back pain (on disability) Nephrolithiasis, hx of Fibromyalgia bipolar disorder Anxiety  Past Surgical History: Last updated: 11/26/2009 Hysterectomy Tonsillectomy  Family History: Last updated: 11/26/2009 Dad- bipolar disorder, committed suicide Mom - HTN  Social History: Last updated: 11/26/2009 On disabiltiy for bipolar disorder and chronic pain. Widow/Widower Alcohol use-yes Drug use-no  Review of Systems      See HPI General:  Denies chills and fever. GI:  Denies nausea and vomiting. GU:  Complains of dysuria and urinary frequency. Neuro:  Denies headaches.  Physical Exam  General:  alert, well-developed, and well-nourished.   Head:  normocephalic and atraumatic.   Eyes:  vision grossly intact, pupils equal, pupils round, and pupils reactive to light.   Ears:  R ear normal and L ear normal.   Nose:  no external deformity.   Mouth:  Oral mucosa and oropharynx without lesions or exudates.  Teeth in good repair. Lungs:  Normal respiratory effort, chest expands symmetrically. Lungs are clear to auscultation, no crackles or wheezes. Heart:  Normal rate and regular rhythm. S1 and S2 normal without gallop, murmur, click, rub or other extra sounds. Abdomen:  Bowel sounds positive,abdomen soft and non-tender without masses, organomegaly or hernias noted. Msk:  No deformity or scoliosis noted of thoracic or lumbar  spine.   Extremities:  no edema. Psych:  Cognition and judgment appear intact. Alert and cooperative with normal attention span and concentration. No apparent delusions, illusions, hallucinations   Impression & Recommendations:  Problem # 1:  HYPERLIPIDEMIA (ICD-272.4) Assessment Unchanged Recheck FLP, hepatic panel today.  Discussed possibility of having to change medications if TG have still not improved. Her updated medication list for this problem includes:    Crestor 20 Mg Tabs (Rosuvastatin calcium) .Marland Kitchen... 1 tablet by mouth daily  Orders: Venipuncture (04540) TLB-Lipid Panel (80061-LIPID) TLB-Hepatic/Liver Function Pnl (80076-HEPATIC)  Problem # 2:  DIABETES MELLITUS, TYPE II (ICD-250.00) Assessment: Unchanged Time spent with patient 45 minutes, more than 50% of this time was spent counseling patient on importance of checking her CBGs and controlling her diabetes.  Discussed eat right diet again.  Will recheck a1c and BMET today, will likely start Lantus once we have those results but it is very important that she checks her CBGs if she is going to take Insulin!  Her updated medication list for this problem includes:    Glipizide-metformin Hcl 2.5-500 Mg Tabs (Glipizide-metformin hcl) .Marland Kitchen... Take two  tablet by mouth two times a day with food    Lisinopril 5 Mg Tabs (Lisinopril) .Marland Kitchen... Take one tablet by mouth daily  Orders: Venipuncture (98119) TLB-A1C / Hgb A1C (Glycohemoglobin) (83036-A1C)  Problem # 3:  HYPOTHYROIDISM (ICD-244.9) Assessment: Unchanged Now on 175 mg daily, recheck TSH today. Her updated medication list for this problem includes:    Levothyroxine Sodium 175 Mcg Tabs (Levothyroxine sodium) .Marland Kitchen... Take one tablet daily 30 minutes before breakfast  Orders: Venipuncture (14782) TLB-TSH (Thyroid Stimulating Hormone) (84443-TSH)  Problem # 4:  DYSURIA (ICD-788.1) Assessment: New UA neg for infection, will send for culture to verify.  Increase frequency likely  due to poorly controlled DM. Orders: UA Dipstick w/o Micro (manual) (95621) T-Culture, Urine ([id number])  Complete Medication List: 1)  Glipizide-metformin Hcl 2.5-500 Mg Tabs (Glipizide-metformin hcl) .... Take two  tablet by mouth two times a day with food 2)  Depakote Er 500 Mg Xr24h-tab (Divalproex sodium) .... Take one tablet by mouth two times a day 3)  Lisinopril 5 Mg Tabs (Lisinopril) .... Take one tablet by mouth daily 4)  Cymbalta 60 Mg Cpep (Duloxetine hcl) .... Take one tablet by mouth daily 5)  Pantoprazole Sodium 40 Mg Tbec (Pantoprazole sodium) .... Take one tablet by mouth daily 6)  Levothyroxine Sodium 175 Mcg Tabs (Levothyroxine sodium) .... Take one tablet daily 30 minutes before breakfast 7)  Diltiazem Hcl Er Beads 180 Mg Xr24h-cap (Diltiazem hcl er beads) .... Take one tablet by mouth at bedtime 8)  Crestor 20 Mg Tabs (Rosuvastatin calcium) .Marland Kitchen.. 1 tablet by mouth daily 9)  Tramadol Hcl 50 Mg Tabs (Tramadol hcl) .Marland Kitchen.. 1-2  tab by mouth 4  times a day as needed pain 10)  Womens Multivitamin Plus Tabs (Multiple vitamins-minerals) .... Take one tablet by mouth daily  Other Orders: TLB-BMP (  Basic Metabolic Panel-BMET) (80048-METABOL) Gastroenterology Referral (GI)  Patient Instructions: 1)  Please stop by to see Aram Beecham on your way out to set up your colonscopy. Prescriptions: TRAMADOL HCL 50 MG  TABS (TRAMADOL HCL) 1-2  tab by mouth 4  times a day as needed pain  #240 x 0   Entered and Authorized by:   [name] MD   Signed by:   [name] MD on 01/01/2010   Method used:   Electronically to        [facility]. # [phone]* (retail)       [address]       [place], [place]  [address]       Ph: [phone]       Fax: [phone]   RxID:   [id number]   Current Allergies (reviewed today): ! * BYETTA  PAP Next Due:  Not Indicated Mammogram Result Date:  06/27/2009 Mammogram Result:  historical normal  Laboratory Results   Urine  Tests  Date/Time Received: January 01, 2010 9:29 AM   Routine Urinalysis   Color: yellow Appearance: Cloudy Glucose: negative   (Normal Range: Negative) Bilirubin: negative   (Normal Range: Negative) Ketone: small (15)   (Normal Range: Negative) Spec. Gravity: 1.010   (Normal Range: 1.003-1.035) Blood: negative   (Normal Range: Negative) pH: 8.5   (Normal Range: 5.0-8.0) Protein: trace   (Normal Range: Negative) Urobilinogen: 0.2   (Normal Range: 0-1) Nitrite: negative   (Normal Range: Negative) Leukocyte Esterace: negative   (Normal Range: Negative)         Prevention & Chronic Care Immunizations   Influenza vaccine: Not documented    Tetanus booster: Not documented    Pneumococcal vaccine: Not documented  Colorectal Screening   Hemoccult: Not documented    Colonoscopy: Not documented   Colonoscopy action/deferral: GI referral  (01/01/2010)  Other Screening   Pap smear: Not documented   Pap smear due: Not Indicated    Mammogram: historical normal  (06/27/2009)   Mammogram due: 06/28/2010   Smoking status: Not documented  Diabetes Mellitus   HgbA1C: 8.5  (11/26/2009)   Hemoglobin A1C due: 02/26/2010    Eye exam: Not documented    Foot exam: Not documented   High risk foot: Not documented   Foot care education: Not documented    Urine microalbumin/creatinine ratio: Not documented  Lipids   Total Cholesterol: 203  (11/26/2009)   LDL: Not documented   LDL Direct: 98.1  (11/26/2009)   HDL: 46.60  (11/26/2009)   Triglycerides: 539.0  (11/26/2009)    SGOT (AST): 11  (11/26/2009)   SGPT (ALT): 10  (11/26/2009)   Alkaline phosphatase: 89  (11/26/2009)   Total bilirubin: 0.4  (11/26/2009)  Hypertension   Last Blood Pressure: 120 / 70  (01/01/2010)   Serum creatinine: 0.9  (11/26/2009)   Serum potassium 4.6  (11/26/2009)  Self-Management Support :    Diabetes self-management support: Not documented    Hypertension self-management support: Not  documented    Lipid self-management support: Not documented    Nursing Instructions: Give Flu vaccine today GI referral for screening colonoscopy (see order)   Appended Document: F/U PER DR ARON/NT

## 2010-05-23 NOTE — Letter (Signed)
Summary: Generic Letter  [facility]  [address] [place], [place] [address]   Phone: [phone]  Fax: [phone]    02/22/2010  TO:   [facility]   [phone] (P)  FROM:   Francee Piccolo, CMA (AAMA)   [phone] (F)  RE:   Lori Phillips   AKA: Lori Phillips   [address]   [place], [place]  [address]   DOB: May 24, 1955  TO WHOM IT MAY CONCERN:  Please fax any and all Gastroenterology records for this patient.  Mrs. Holst believes these services were rendered in 2004 or 2005 at your facility.  Please call me if you need any further information.  Please fax information to [phone] as soon as possible as the patient has an upcoming appointment.  This is a Biomedical scientist station fax number.  Thank you for your help,    Francee Piccolo CMA [facility])  Appended Document: Generic Letter letter printed and faxed

## 2010-05-23 NOTE — Progress Notes (Signed)
Summary: Rx Hydrocodone  Phone Note Refill Request Call back at [phone] Message from:  [facility] on March 20, 2010 7:51 AM  Refills Requested: Medication #1:  VICODIN 5-500 MG TABS 1 tab by mouth every 6 hours as needed for severe pain.   Last Refilled: 02/21/2010 Received E-script request please advise.   Method Requested: Telephone to Pharmacy Initial call taken by: Linde Gillis CMA Duncan Dull),  March 20, 2010 7:51 AM  Follow-up for Phone Call        Rx called to pharmacy Follow-up by: Linde Gillis CMA Duncan Dull),  March 20, 2010 8:33 AM    Prescriptions: VICODIN 5-500 MG TABS (HYDROCODONE-ACETAMINOPHEN) 1 tab by mouth every 6 hours as needed for severe pain.  #60 x 0   Entered and Authorized by:   Kerby Nora MD   Signed by:   Kerby Nora MD on 03/20/2010   Method used:   Telephoned to ...       [facility] # [phone]* (retail)       [address]       [place], [place]  [address]       Ph: [phone]       Fax: [phone]   RxID:   [id number]

## 2010-05-23 NOTE — Letter (Signed)
Summary: Controlled Substances Contract  [facility]  [address] [place], [place] [address]   Phone: [phone]  Fax: [phone]    [facility] Controlled Substances Contract         Patient Name: Lori Phillips Patient DOB: 09-Dec-1955        Patient MRN:  [id number]        Physician's Name: _________________________________________   Patients must complete this contract before doctors at the [facility] office will be willing to prescribe controlled substances. I understand that: ___1)  I am responsible for my controlled substance medications.  If my prescription is lost, misplaced or stolen, or if I take more than prescribed, my doctor will not write me a new prescription. ___2)  I will not request or accept controlled substances or controlled substance prescriptions from any other doctor or clinic while I am receiving controlled substance treatment at [facility].  The ONLY exception is if controlled substances are prescribed for the treatment of an acute condition that is NOT the diagnosis for which I am receiving treatment at [facility].   I will call my physician at [facility] if I receive controlled substance or controlled substance prescriptions from anywhere else. ___3)  Controlled substance refills will be made ONLY during regular office hours. ___4)  Refills will not be made if I run out early.  Refills will not be made during work-in or urgent care visits.  Refills will NOT be made for "emergencies", such as on a Friday afternoon or by on call service at night or weekends.  I understand that I am required to call at least 2 business days prior to expiration date for controlled substance and/or needing controlled substance refills.   ___5)  I will not use illicit (illegal) drugs.  ___6)  I agree to take urine or blood drug tests when requested for routine screening. ___7)  I agree to use only ONE pharmacy for  filling ALL my controlled substance prescriptions.             Name and Location of Pharmacy:                                                                                                                  ___8)  I understand that my doctor may review my use of controlled substances using the [facility] Controlled Substance Reporting System. ___9)  If I behave in an abusive way towards [facility] staff, my controlled substance prescriptions may be stopped, and I may be dismissed from this practice. ___10)  I understand that if I break any of the above terms of this contract, my pain prescription and/or treatment may be stopped immediately.  If I get controlled substances from someone else or use illegal drugs, I may be reported to all my doctors, medical facilities and appropriate authorities.  I have been fully informed by [facility] physicians and the staff regarding psychological dependence (addiction)  to controlled substances.  I understand that I should stop my medication ONLY under medical supervision or I may have withdrawal symptoms.  ***I have read this contract and it has been explained to me by [facility] physicians and/or their staff, and I fully understand the consequences of violating any of the terms of this contract.    Patient Signature _________________________________________ Date February 21, 2010   [facility] Staff Signature ____________________________________ Date February 21, 2010

## 2010-05-23 NOTE — Progress Notes (Signed)
Summary: pt called with blood sugar readings  Phone Note Call from Patient Call back at [phone]   Caller: Patient Call For: [name] MD Summary of Call: Pt called with blood sugar readings.  9/22- 159 am, 155 pm.  9/23- 161 am, 155 pm.  9/24-133 am, 188 pm.  9/25- 166 am, 112 pm.  9/26- 108 am, 123 pm.  9/27- 169 am, 155 pm.  9/28- 164 am, 148 pm.  9/29- 151 am.  She is taking 13 units of lantus daily.  Initial call taken by: Lowella Petties CMA,  January 17, 2010 2:16 PM  Follow-up for Phone Call        Please increase to 16 units of Lantus daily. [name] MD  January 17, 2010 2:23 PM  Patient advised as instructed via telephone.   Advised patient to call back with BS reading like she have been doing in the past.   Updated medication on med list.  Follow-up by: Linde Gillis CMA Duncan Dull),  January 17, 2010 2:37 PM    New/Updated Medications: LANTUS SOLOSTAR 100 UNIT/ML  SOLN (INSULIN GLARGINE) 16 units daily  Current Allergies (reviewed today): ! * BYETTA

## 2010-05-23 NOTE — Progress Notes (Signed)
Summary: reschedule colonoscopy, not due yet  Phone Note Outgoing Call   Summary of Call: See colonoscopy update she is not sue til 6/12 and should have Propofol if possible unless there was another colonoscopy since then, cancel current colonoscopy and reschedule recall. notify (route to PCP also) Iva Boop MD, [facility]  March 13, 2010 4:23 PM   Follow-up for Phone Call        LM to [facility] at home number, work number is out of service.  Francee Piccolo CMA (AAMA)  March 18, 2010 1:20 PM  RC from pt.  I advised her of Dr. Marvell Fuller recommendation and she is agreeable.  She states that her last colon was in [facility].  I advised her that I called 2 different offices and the hospital there and they did not have any record of her having procedures there, and that I called [facility] at her daughter's recommendation.  Pt says 2007 colon was probably her last.  Appt is cancelled for 04/04/10 and recall entered for 09/2010. Follow-up by: Francee Piccolo CMA Duncan Dull),  March 18, 2010 1:30 PM

## 2010-05-23 NOTE — Progress Notes (Signed)
Summary: pt's history  Phone Note Outgoing Call   Call placed by: Karl Bales RN,  February 21, 2010 12:55 PM Summary of Call: This pt was seen in previsit this a.m. for her colonoscopy, which in scheduled for 04-04-10.  She had her last colonoscopy done at [facility], either in 2004 or 2005.  She has a history of polyps and a family history of colon CA (grandmother and aunt).  Pt has a history of panic attacks, bipolar, anxiety, depression, among other medical problems, such as DM and sleep apnea.  Pt is on Depakote, Cymbalta, Tramadol, and Vicodin.  Writer asked if she was anxious about her colonoscopy and pt stated, "No, not at all."  Judeth Cornfield is aware of pt and is trying to obtain her previous colonoscopy records from [facility] Initial call taken by: Karl Bales RN,  February 21, 2010 12:59 PM  Follow-up for Phone Call        await review of records could need propofol but apparently ok on last one  Follow-up by: Iva Boop MD, Clementeen Graham,  February 25, 2010 8:12 AM

## 2010-05-23 NOTE — Assessment & Plan Note (Signed)
Summary: medicare cpx/dlo   Vital Signs:  Patient profile:   55 year old female Height:      66.5 inches Weight:      188 pounds BMI:     30.00 Temp:     97.8 degrees F oral Pulse rate:   60 / minute Pulse rhythm:   regular BP sitting:   140 / 90  (left arm) Cuff size:   regular  Vitals Entered By: Linde Gillis CMA Duncan Dull) (February 21, 2010 8:18 AM) CC: medicare physicial  Vision Screening:Both eyes w/o correction:  20/ 25  Color vision testing: normal     Vision Comments: patient could not see well with her glasses.  She says that she needs an eye exam.  Could only see good with both eyes  Vision Entered By: Linde Gillis CMA Duncan Dull) (February 21, 2010 8:25 AM)  Hearing Screen  20db HL: Left  500 hz: 20db 1000 hz: 20db 2000 hz: 20db 4000 hz: 20db Right  500 hz: 20db 1000 hz: 20db 2000 hz: 20db 4000 hz: 20db   Hearing Testing Entered By: Linde Gillis CMA Duncan Dull) (February 21, 2010 8:26 AM)   History of Present Illness: Here for Medicare AWV:  1.   Risk factors based on Past M, S, F history:  DM- a1c in September was 9.0, now on 18 units of Lantus  and Glipize-Metformin 2.5/500 mg- 2 tabs two times a day, CBGs still rangin in mid 150s- 200s.   .  Denies any episodes of hypoglycemic symptoms.   HTN- has been well controlled on Lisinopril 5 mg daily and Diltiazam 180 mg at bedtime.   HLD- had  been on Lipitor 80 mg for years, then switched to Crestor when TG were 539.  TG remained elevated at 394 in September, stopped the Crestor and started Tricor as HDL and LDL were very good.    Chronic pain- h/o RA and fibromyalgia.  Used to go pain clinic but felt they overmedicated her.  Now only on Cymbalta 60 mg daily and Tramadol as needed.  At times, pain is unbearable. 2.   Physical Activities: no very physically active 3.   Depression/mood: mood stable on Depakote 4.   Hearing: see nursing assessment. 5.   ADL's: see geriatrics assessment. 6.   Fall Risk: not a fall  risk 7.   Home Safety: safe, lives with daughter. 8.   Height, weight, &visual acuity:see nursing assessment. 9.   Counseling:  Discussed importance of seeing eye doctor, vision screen was poor today. Has appt tomorrow for colonscopy prep. 10.   Labs ordered based on risk factors:  Lipid panel, a1c, hepatic panel. 11.           Referral Coordination- none indicated today. 12.           Care Plan- discussed advance directives, updated in EMR. 13.            Cognitive Assessment- see geriatrics assessment.  Allergies: 1)  ! * Byetta  Review of Systems      See HPI  Physical Exam  General:  alert, well-developed, and well-nourished.   Msk:  No deformity or scoliosis noted of thoracic or lumbar spine.   joint tenderness and joint swelling in DIPs, PIPS.   Multiple pos tender points. Extremities:  no edema. Neurologic:  alert & oriented X3 and gait normal.   Psych:  Cognition and judgment appear intact. Alert and cooperative with normal attention span and concentration. No apparent  delusions, illusions, hallucinations   Impression & Recommendations:  Problem # 1:  Preventive Health Care (ICD-V70.0)  Reviewed preventive care protocols, scheduled due services, and updated immunizations Discussed nutrition, exercise, diet, and healthy lifestyle.  Orders: Medicare -1st Annual Wellness Visit [id number])  Problem # 2:  DIABETES MELLITUS, TYPE II (ICD-250.00) Assessment: Unchanged increase lantus to 20 units, recheck a1c. Her updated medication list for this problem includes:    Glipizide-metformin Hcl 2.5-500 Mg Tabs (Glipizide-metformin hcl) .Marland Kitchen... Take two  tablet by mouth two times a day with food    Lisinopril 5 Mg Tabs (Lisinopril) .Marland Kitchen... Take one tablet by mouth daily    Lantus Solostar 100 Unit/ml Soln (Insulin glargine) .Marland Kitchen... 20 units daily  Orders: TLB-A1C / Hgb A1C (Glycohemoglobin) (83036-A1C)  Problem # 3:  HYPERLIPIDEMIA (ICD-272.4) Assessment: Unchanged recheck FLP  today. Her updated medication list for this problem includes:    Tricor 145 Mg Tabs (Fenofibrate) .Marland Kitchen... 1 tab by mouth daily.  Orders: Venipuncture (62130) TLB-Lipid Panel (80061-LIPID) TLB-Hepatic/Liver Function Pnl (80076-HEPATIC)  Problem # 4:  OTHER CHRONIC PAIN (ICD-338.29) Assessment: Deteriorated compounded by multiple issues- fibromyalgia, RA, OA. Will give short course of Vicodin to use only for EXTREME pain, controlled substance contract signed today.  Complete Medication List: 1)  Glipizide-metformin Hcl 2.5-500 Mg Tabs (Glipizide-metformin hcl) .... Take two  tablet by mouth two times a day with food 2)  Depakote Er 500 Mg Xr24h-tab (Divalproex sodium) .... Take one tablet by mouth two times a day 3)  Lisinopril 5 Mg Tabs (Lisinopril) .... Take one tablet by mouth daily 4)  Cymbalta 60 Mg Cpep (Duloxetine hcl) .... Take one tablet by mouth daily 5)  Pantoprazole Sodium 40 Mg Tbec (Pantoprazole sodium) .... Take one tablet by mouth daily 6)  Diltiazem Hcl Er Beads 180 Mg Xr24h-cap (Diltiazem hcl er beads) .... Take one tablet by mouth at bedtime 7)  Tramadol Hcl 50 Mg Tabs (Tramadol hcl) .Marland Kitchen.. 1-2  tab by mouth 4  times a day as needed pain 8)  Womens Multivitamin Plus Tabs (Multiple vitamins-minerals) .... Take one tablet by mouth daily 9)  Tricor 145 Mg Tabs (Fenofibrate) .Marland Kitchen.. 1 tab by mouth daily. 10)  Levothyroxine Sodium 125 Mcg Tabs (Levothyroxine sodium) .Marland Kitchen.. 1 tab by mouth daily. 11)  Lantus Solostar 100 Unit/ml Soln (Insulin glargine) .... 20 units daily 12)  Vicodin 5-500 Mg Tabs (Hydrocodone-acetaminophen) .Marland Kitchen.. 1 tab by mouth every 6 hours as needed for severe pain. Prescriptions: VICODIN 5-500 MG TABS (HYDROCODONE-ACETAMINOPHEN) 1 tab by mouth every 6 hours as needed for severe pain.  #60 x 0   Entered and Authorized by:   [name] MD   Signed by:   [name] MD on 02/21/2010   Method used:   Print then Give to Patient   RxID:   [id number]    Orders  Added: 1)  Venipuncture [32440] 2)  TLB-Lipid Panel [80061-LIPID] 3)  TLB-Hepatic/Liver Function Pnl [80076-HEPATIC] 4)  TLB-A1C / Hgb A1C (Glycohemoglobin) [83036-A1C] 5)  Medicare -1st Annual Wellness Visit [G0438] 6)  Est. Patient Level III [10272]    Current Allergies (reviewed today): ! * BYETTA     Geriatric Assessment:  Activities of Daily Living:    Bathing-independent    Dressing-independent    Eating-independent    Toileting-independent    Transferring-independent    Continence-independent  Instrumental Activities of Daily Living:    Transportation-assisted    Meal/Food Preparation-assisted    Shopping Errands-assisted    Housekeeping/Chores-assisted    Money Management/Finances-independent  Medication Management-independent    Ability to Use Telephone-independent    Laundry-assisted  Mental Status Exam: (value/max value)    Orientation to Time: 5/5    Orientation to Place: 5/5    Registration: 3/3    Attention/Calculation: 5/5    Recall: 3/3    Language-name 2 objects: 2/2    Language-repeat: 1/1    Language-follow 3-step command: 3/3    Language-read and follow direction: 1/1    Write a sentence: 1/1    Copy design: 1/1 MSE Total score: 30/30

## 2010-05-23 NOTE — Progress Notes (Signed)
Summary: Rx for Bayer Glucose meter, lancets, and strips  Phone Note Call from Patient Call back at Home Phone [phone]   Caller: Patient Call For: [name] MD Summary of Call: One touch glucometer and supplies were sent to pharmacy yesterday for pt but she doesnt want that because she has problem with calibrating it. She is asking for a bayer breeze machine, strips and lancets to be sent to [facility] [place]. Initial call taken by: Lowella Petties CMA, AAMA,  February 22, 2010 2:42 PM  Follow-up for Phone Call        ok to send this in. [name] MD  February 22, 2010 3:09 PM  Rx's sent to pharmacy, patient notified as instructed via telephone. Follow-up by: Linde Gillis CMA Duncan Dull),  February 22, 2010 3:21 PM    New/Updated Medications: BAYER BREEZE 2 SYSTEM W/DEVICE KIT (BLOOD GLUCOSE MONITORING SUPPL) use as directed to test blood sugar BAYER BREEZE 2 TEST  DISK (GLUCOSE BLOOD) use to test blood sugar as directed BAYER MICROLET LANCETS  MISC (LANCETS) use to test blood sugar as directed Prescriptions: BAYER MICROLET LANCETS  MISC (LANCETS) use to test blood sugar as directed  #100 x 11   Entered by:   Linde Gillis CMA (AAMA)   Authorized by:   [name] MD   Signed by:   Linde Gillis CMA (AAMA) on 02/22/2010   Method used:   Electronically to        [facility] # [phone]* (retail)       [address]       [place], [place]  [address]       Ph: [phone]       Fax: [phone]   RxID:   [id number] BAYER BREEZE 2 TEST  DISK (GLUCOSE BLOOD) use to test blood sugar as directed  #100 x 11   Entered by:   Linde Gillis CMA (AAMA)   Authorized by:   [name] MD   Signed by:   Linde Gillis CMA (AAMA) on 02/22/2010   Method used:   Electronically to        [facility]. # [phone]* (retail)       [address]       [place], [place]  [address]       Ph: [phone]       Fax: [phone]   RxID:   [id number] BAYER  BREEZE 2 SYSTEM W/DEVICE KIT (BLOOD GLUCOSE MONITORING SUPPL) use as directed to test blood sugar  #1 kit x 0   Entered by:   Linde Gillis CMA (AAMA)   Authorized by:   [name] MD   Signed by:   Linde Gillis CMA (AAMA) on 02/22/2010   Method used:   Electronically to        [facility] # [phone]* (retail)       [address]       [place], [place]  [address]       Ph: [phone]       Fax: [phone]   RxID:   [id number]   Current Allergies (reviewed today): ! * BYETTA ! * SEROQUEL

## 2010-05-23 NOTE — Letter (Signed)
Summary: Pre Visit Letter Revised  [facility]  [address] [place], [place] [address]   Phone: [phone]  Fax: [phone]        01/10/2010 MRN: [id number] [facility] [address] [place], [place]  [address]             Procedure Date:  03/07/2010   Welcome to the Gastroenterology Division at [facility].    You are scheduled to see a nurse for your pre-procedure visit on 02/04/2010 at 1:00PM on the 3rd floor at [facility], [address]  We ask that you try to arrive at our office 15 minutes prior to your appointment time to allow for check-in.  Please take a minute to review the attached form.  If you answer "Yes" to one or more of the questions on the first page, we ask that you call the person listed at your earliest opportunity.  If you answer "No" to all of the questions, please complete the rest of the form and bring it to your appointment.    Your nurse visit will consist of discussing your medical and surgical history, your immediate family medical history, and your medications.   If you are unable to list all of your medications on the form, please bring the medication bottles to your appointment and we will list them.  We will need to be aware of both prescribed and over the counter drugs.  We will need to know exact dosage information as well.    Please be prepared to read and sign documents such as consent forms, a financial agreement, and acknowledgement forms.  If necessary, and with your consent, a friend or relative is welcome to sit-in on the nurse visit with you.  Please bring your insurance card so that we may make a copy of it.  If your insurance requires a referral to see a specialist, please bring your referral form from your primary care physician.  No co-pay is required for this nurse visit.     If you cannot keep your appointment, please call [phone] to cancel or reschedule prior to your appointment date.  This  allows [place] the opportunity to schedule an appointment for another patient in need of care.    Thank you for choosing [facility] for your medical needs.  We appreciate the opportunity to care for you.  Please visit [place] at our website  to learn more about our practice.  Sincerely, The Gastroenterology Division

## 2010-05-23 NOTE — Letter (Signed)
Summary: [facility]  [facility]   Imported By: Maryln Gottron 02/08/2010 15:13:03  _____________________________________________________________________  External Attachment:    Type:   Image     Comment:   External Document

## 2010-05-27 ENCOUNTER — Telehealth: Payer: Self-pay | Admitting: Family Medicine

## 2010-05-29 NOTE — Progress Notes (Signed)
Summary: Rx Hydrocodone  Phone Note Refill Request Call back at [phone] Message from:  [facility] on May 21, 2010 8:33 AM  Refills Requested: Medication #1:  VICODIN 5-500 MG TABS 1 tab by mouth every 6 hours as needed for severe pain.   Last Refilled: 05/06/2010 Received E-script request please advise.   Method Requested: Telephone to Pharmacy Initial call taken by: Linde Gillis CMA Duncan Dull),  May 21, 2010 8:34 AM  Follow-up for Phone Call        Rx called to pharmacy Follow-up by: Linde Gillis CMA Duncan Dull),  May 21, 2010 8:53 AM    Prescriptions: VICODIN 5-500 MG TABS (HYDROCODONE-ACETAMINOPHEN) 1 tab by mouth every 6 hours as needed for severe pain.  #60 x 0   Entered and Authorized by:   [name] MD   Signed by:   [name] MD on 05/21/2010   Method used:   Telephoned to ...       [facility] # [phone]* (retail)       [address]       [place], [place]  [address]       Ph: [phone]       Fax: [phone]   RxID:   [id number]

## 2010-06-06 ENCOUNTER — Telehealth: Payer: Self-pay | Admitting: Family Medicine

## 2010-06-06 NOTE — Progress Notes (Signed)
Summary: Rx Tramadol  Phone Note Refill Request Call back at [phone] Message from:  [facility] on May 27, 2010 8:46 AM  Refills Requested: Medication #1:  TRAMADOL HCL 50 MG  TABS 1-2  tab by mouth 4  times a day as needed pain   Last Refilled: 04/18/2010 Received E-script request please advise.   Method Requested: Electronic Initial call taken by: Linde Gillis CMA Duncan Dull),  May 27, 2010 8:46 AM    Prescriptions: TRAMADOL HCL 50 MG  TABS (TRAMADOL HCL) 1-2  tab by mouth 4  times a day as needed pain  #240 x 0   Entered and Authorized by:   [name] MD   Signed by:   [name] MD on 05/27/2010   Method used:   Electronically to        [facility] # [phone]* (retail)       [address]       [place], [place]  [address]       Ph: [phone]       Fax: [phone]   RxID:   [id number]

## 2010-06-12 NOTE — Progress Notes (Signed)
Summary: Rx Hydrocodone-APAP  Phone Note Refill Request Call back at [phone] Message from:  [facility] on June 06, 2010 8:03 AM  Refills Requested: Medication #1:  VICODIN 5-500 MG TABS 1 tab by mouth every 6 hours as needed for severe pain.   Last Refilled: 05/21/2010 Received E-script request please advise.   Method Requested: Telephone to Pharmacy Initial call taken by: Linde Gillis CMA Duncan Dull),  June 06, 2010 8:03 AM  Follow-up for Phone Call        Rx called to pharmacy Follow-up by: Linde Gillis CMA Duncan Dull),  June 06, 2010 8:25 AM    Prescriptions: VICODIN 5-500 MG TABS (HYDROCODONE-ACETAMINOPHEN) 1 tab by mouth every 6 hours as needed for severe pain.  #60 x 0   Entered and Authorized by:   [name] MD   Signed by:   [name] MD on 06/06/2010   Method used:   Telephoned to ...       [facility] # [phone]* (retail)       [address]       [place], [place]  [address]       Ph: [phone]       Fax: [phone]   RxID:   [id number]

## 2010-06-21 ENCOUNTER — Telehealth: Payer: Self-pay | Admitting: Family Medicine

## 2010-06-24 ENCOUNTER — Encounter (INDEPENDENT_AMBULATORY_CARE_PROVIDER_SITE_OTHER): Payer: Self-pay | Admitting: *Deleted

## 2010-06-27 NOTE — Progress Notes (Signed)
Summary: Rx Tramadol & Vicodin  Phone Note Refill Request Call back at [phone] Message from:  [facility] on June 21, 2010 2:39 PM  Refills Requested: Medication #1:  TRAMADOL HCL 50 MG  TABS 1-2  tab by mouth 4  times a day as needed pain   Last Refilled: 05/27/2010  Medication #2:  VICODIN 5-500 MG TABS 1 tab by mouth every 6 hours as needed for severe pain.   Last Refilled: 06/06/2010 Received E-script request please advise.   Method Requested: Telephone to Pharmacy Initial call taken by: Linde Gillis CMA Duncan Dull),  June 21, 2010 2:40 PM  Follow-up for Phone Call        Rx's called to pharmacy. Follow-up by: Linde Gillis CMA Duncan Dull),  June 21, 2010 2:52 PM    Prescriptions: VICODIN 5-500 MG TABS (HYDROCODONE-ACETAMINOPHEN) 1 tab by mouth every 6 hours as needed for severe pain.  #60 x 0   Entered and Authorized by:   [name] MD   Signed by:   [name] MD on 06/21/2010   Method used:   Telephoned to ...       [facility] # [phone]* (retail)       [address]       [place], [place]  [address]       Ph: [phone]       Fax: [phone]   RxID:   [id number] TRAMADOL HCL 50 MG  TABS (TRAMADOL HCL) 1-2  tab by mouth 4  times a day as needed pain  #240 x 0   Entered and Authorized by:   [name] MD   Signed by:   [name] MD on 06/21/2010   Method used:   Telephoned to ...       [facility] # [phone]* (retail)       [address]       [place], [place]  [address]       Ph: [phone]       Fax: [phone]   RxID:   [id number]

## 2010-07-02 NOTE — Miscellaneous (Signed)
Summary: med list update- lantus  Clinical Lists Changes  Medications: Changed medication from LANTUS SOLOSTAR 100 UNIT/ML  SOLN (INSULIN GLARGINE) 20 units daily to LANTUS SOLOSTAR 100 UNIT/ML  SOLN (INSULIN GLARGINE) use 34-36 units daily     Prior Medications: GLIPIZIDE-METFORMIN HCL 2.5-500 MG TABS (GLIPIZIDE-METFORMIN HCL) take two  tablet by mouth two times a day with food DEPAKOTE ER 500 MG XR24H-TAB (DIVALPROEX SODIUM) take one tablet by mouth two times a day LISINOPRIL 5 MG TABS (LISINOPRIL) take one tablet by mouth daily CYMBALTA 60 MG CPEP (DULOXETINE HCL) take one tablet by mouth daily PANTOPRAZOLE SODIUM 40 MG TBEC (PANTOPRAZOLE SODIUM) take one tablet by mouth daily DILTIAZEM HCL ER BEADS 180 MG XR24H-CAP (DILTIAZEM HCL ER BEADS) take one tablet by mouth at bedtime TRAMADOL HCL 50 MG  TABS (TRAMADOL HCL) 1-2  tab by mouth 4  times a day as needed pain WOMENS MULTIVITAMIN PLUS  TABS (MULTIPLE VITAMINS-MINERALS) take one tablet by mouth daily LEVOTHYROXINE SODIUM 125 MCG TABS (LEVOTHYROXINE SODIUM) 1 tab by mouth daily. LANTUS SOLOSTAR 100 UNIT/ML  SOLN (INSULIN GLARGINE) use 34-36 units daily VICODIN 5-500 MG TABS (HYDROCODONE-ACETAMINOPHEN) 1 tab by mouth every 6 hours as needed for severe pain. BAYER BREEZE 2 SYSTEM W/DEVICE KIT (BLOOD GLUCOSE MONITORING SUPPL) use as directed to test blood sugar BAYER BREEZE 2 TEST  DISK (GLUCOSE BLOOD) use to test blood sugar as directed CRESTOR 20 MG TABS (ROSUVASTATIN CALCIUM) 1 tablet by mouth daily BAYER MICROLET LANCETS  MISC (LANCETS) use to test blood sugar as directed Current Allergies: ! * BYETTA ! * SEROQUEL

## 2010-07-16 ENCOUNTER — Encounter: Payer: Self-pay | Admitting: Family Medicine

## 2010-07-16 LAB — HM MAMMOGRAPHY

## 2010-07-16 LAB — HM COLONOSCOPY

## 2010-07-23 ENCOUNTER — Ambulatory Visit (INDEPENDENT_AMBULATORY_CARE_PROVIDER_SITE_OTHER): Payer: Medicare Other | Admitting: Family Medicine

## 2010-07-23 ENCOUNTER — Encounter: Payer: Self-pay | Admitting: Family Medicine

## 2010-07-23 DIAGNOSIS — E119 Type 2 diabetes mellitus without complications: Secondary | ICD-10-CM

## 2010-07-23 DIAGNOSIS — I10 Essential (primary) hypertension: Secondary | ICD-10-CM

## 2010-07-23 DIAGNOSIS — E039 Hypothyroidism, unspecified: Secondary | ICD-10-CM

## 2010-07-23 DIAGNOSIS — E785 Hyperlipidemia, unspecified: Secondary | ICD-10-CM

## 2010-07-23 DIAGNOSIS — G8929 Other chronic pain: Secondary | ICD-10-CM

## 2010-07-23 DIAGNOSIS — M7540 Impingement syndrome of unspecified shoulder: Secondary | ICD-10-CM

## 2010-07-23 LAB — HEMOGLOBIN A1C: Hgb A1c MFr Bld: 7.9 % — ABNORMAL HIGH (ref 4.6–6.5)

## 2010-07-23 MED ORDER — LEVOTHYROXINE SODIUM 125 MCG PO TABS: 125.0000 ug | ORAL_TABLET | Freq: Every day | ORAL | Status: DC

## 2010-07-23 MED ORDER — TRAMADOL HCL 50 MG PO TABS: 50.0000 mg | ORAL_TABLET | Freq: Four times a day (QID) | ORAL | Status: DC | PRN

## 2010-07-23 MED ORDER — PANTOPRAZOLE SODIUM 40 MG PO TBEC: 40.0000 mg | DELAYED_RELEASE_TABLET | Freq: Every day | ORAL | Status: DC

## 2010-07-23 MED ORDER — DIVALPROEX SODIUM ER 500 MG PO TB24: 500.0000 mg | ORAL_TABLET | Freq: Two times a day (BID) | ORAL | Status: DC

## 2010-07-23 MED ORDER — IODOQUINOL-HC 1 % EX CREA: TOPICAL_CREAM | CUTANEOUS | Status: DC

## 2010-07-23 MED ORDER — HYDROCODONE-ACETAMINOPHEN 5-500 MG PO TABS: 1.0000 | ORAL_TABLET | Freq: Four times a day (QID) | ORAL | Status: DC | PRN

## 2010-07-23 MED ORDER — LISINOPRIL 5 MG PO TABS: 5.0000 mg | ORAL_TABLET | Freq: Every day | ORAL | Status: DC

## 2010-07-23 MED ORDER — DULOXETINE HCL 60 MG PO CPEP: 60.0000 mg | ORAL_CAPSULE | Freq: Every day | ORAL | Status: DC

## 2010-07-23 MED ORDER — ROSUVASTATIN CALCIUM 20 MG PO TABS: 20.0000 mg | ORAL_TABLET | Freq: Every day | ORAL | Status: DC

## 2010-07-23 NOTE — Assessment & Plan Note (Signed)
Deteriorated.  Would like referral to ortho for impingement syndrome.

## 2010-07-23 NOTE — Patient Instructions (Signed)
Please stop by to see Marion on your way out. 

## 2010-07-23 NOTE — Assessment & Plan Note (Signed)
Deteriorated.  Has not taken her medication today. Advised to check BP at home next few days and call me with an update. Check BMET today.

## 2010-07-23 NOTE — Assessment & Plan Note (Signed)
Continue current meds. Recheck a1c today.

## 2010-07-23 NOTE — Progress Notes (Signed)
55 yo here to follow up several issues.    DM- does not check her CBGs regulary.  Currently on Glipize-Metformin 2.5/500 mg- 2 tabs two times a day, Lantus 36 units daily.  Denies any episodes of hypoglycemic symptoms. a1c in November was was 8.0%, trying to follow eat right diet.   Lab Results  Component Value Date   HGBA1C 8.0* 02/21/2010   HGBA1C 9.0* 01/01/2010   HGBA1C 8.5* 11/26/2009   Lab Results  Component Value Date   CREATININE 0.8 01/01/2010    Hypothyroidism- was on  Levothyroxiine 125 micrograms daily. Denies palpitations or hot/cold intolerance.  HTN- has been well controlled on Lisinopril 5 mg daily and Diltiazam 180 mg at bedtime. BP elevated today but has not taken her medication today.  HLD- Crestor 20 mg daily. Lab Results  Component Value Date   CHOL 251* 02/21/2010   CHOL 126 01/01/2010   CHOL 203* 11/26/2009   Lab Results  Component Value Date   HDL 47.50 02/21/2010   HDL 16.10* 01/01/2010   HDL 46.60 11/26/2009   No results found for this basename: LDLCALC   Lab Results  Component Value Date   TRIG 361.0* 02/21/2010   TRIG 394.0* 01/01/2010   TRIG 539.0* 11/26/2009   Lab Results  Component Value Date   CHOLHDL 5 02/21/2010   CHOLHDL 3 01/01/2010   CHOLHDL 4 11/26/2009    Lab Results  Component Value Date   ALT 18 02/21/2010   AST 19 02/21/2010   ALKPHOS 48 02/21/2010   BILITOT 0.6 02/21/2010   Chronic pain- would like referral to ortho for worsening UE impingement symptoms.  No UE weakness.    Review of Systems       See HPI General:  Denies chills and fever. GI:  Denies nausea and vomiting. GU:  Complains of dysuria and urinary frequency. Neuro:  Denies headaches.  The PMH, PSH, Social History, Family History, Medications, and allergies have been reviewed in [facility], and have been updated if relevant.  Physical Exam BP 150/80  Pulse 68  Temp(Src) 97.8 F (36.6 C) (Oral)  Ht 5\' 6"  (1.676 m)  Wt 194 lb (87.998 kg)  BMI 31.31 kg/m2  General:  alert,  well-developed, and well-nourished.   Head:  normocephalic and atraumatic.   Eyes:  vision grossly intact, pupils equal, pupils round, and pupils reactive to light.   Ears:  R ear normal and L ear normal.   Nose:  no external deformity.   Mouth:  Oral mucosa and oropharynx without lesions or exudates.  Teeth in good repair. Lungs:  Normal respiratory effort, chest expands symmetrically. Lungs are clear to auscultation, no crackles or wheezes. Heart:  Normal rate and regular rhythm. S1 and S2 normal without gallop, murmur, click, rub or other extra sounds. Abdomen:  Bowel sounds positive,abdomen soft and non-tender.  Some tightness of paraspinous muscles. Extremities:  no edema. Diabetic foot exam: Normal inspection No skin breakdown No calluses  Normal DP pulses Normal sensation to light tough and monofilament Nails normal  Psych:  Cognition and judgment appear intact. Alert and cooperative with normal attention span and concentration. No apparent delusions, illusions, hallucinations

## 2010-07-24 ENCOUNTER — Telehealth: Payer: Self-pay | Admitting: *Deleted

## 2010-07-24 ENCOUNTER — Other Ambulatory Visit: Payer: Self-pay | Admitting: Family Medicine

## 2010-07-24 LAB — HEPATIC FUNCTION PANEL
ALT: 10 U/L (ref 0–35)
AST: 15 U/L (ref 0–37)
Albumin: 4 g/dL (ref 3.5–5.2)
Alkaline Phosphatase: 59 U/L (ref 39–117)
Bilirubin, Direct: 0.1 mg/dL (ref 0.0–0.3)
Total Bilirubin: 0.2 mg/dL — ABNORMAL LOW (ref 0.3–1.2)
Total Protein: 6.8 g/dL (ref 6.0–8.3)

## 2010-07-24 LAB — BASIC METABOLIC PANEL
BUN: 12 mg/dL (ref 6–23)
CO2: 29 mEq/L (ref 19–32)
Calcium: 9.3 mg/dL (ref 8.4–10.5)
Chloride: 103 mEq/L (ref 96–112)
Creatinine, Ser: 0.9 mg/dL (ref 0.4–1.2)
GFR: 68.56 mL/min (ref >60.00)
Glucose, Bld: 123 mg/dL — ABNORMAL HIGH (ref 70–99)
Potassium: 4.1 mEq/L (ref 3.5–5.1)
Sodium: 143 mEq/L (ref 135–145)

## 2010-07-24 LAB — LIPID PANEL
Cholesterol: 124 mg/dL (ref 0–200)
HDL: 39.7 mg/dL (ref >39.00)
Total CHOL/HDL Ratio: 3
Triglycerides: 332 mg/dL — ABNORMAL HIGH (ref 0.0–149.0)
VLDL: 66.4 mg/dL — ABNORMAL HIGH (ref 0.0–40.0)

## 2010-07-24 LAB — TSH: TSH: 12.1 u[IU]/mL — ABNORMAL HIGH (ref 0.35–5.50)

## 2010-07-24 MED ORDER — TRAMADOL HCL 50 MG PO TABS: 50.0000 mg | ORAL_TABLET | Freq: Four times a day (QID) | ORAL | Status: DC | PRN

## 2010-07-24 MED ORDER — DILTIAZEM HCL ER BEADS 180 MG PO CP24: 180.0000 mg | ORAL_CAPSULE | Freq: Every day | ORAL | Status: DC

## 2010-07-24 MED ORDER — LEVOTHYROXINE SODIUM 100 MCG PO TABS: 100.0000 ug | ORAL_TABLET | Freq: Every day | ORAL | Status: DC

## 2010-07-24 NOTE — Telephone Encounter (Signed)
Ok to refill for 90 day supply 

## 2010-07-24 NOTE — Telephone Encounter (Signed)
Patient stated that her medications should be for a 90 day supply.  Please advise.

## 2010-07-24 NOTE — Telephone Encounter (Signed)
Patient called regarding her tramadol rx. It was for only 60 and she normally gets 240. She is asking if she can get new rx called in to [facility] [address]

## 2010-07-25 NOTE — Telephone Encounter (Signed)
Rx sent to pharmacy   

## 2010-07-28 LAB — URINE MICROSCOPIC-ADD ON

## 2010-07-28 LAB — URINE CULTURE

## 2010-07-28 LAB — CBC
HCT: 34.5 % — ABNORMAL LOW (ref 36.0–46.0)
Hemoglobin: 11.9 g/dL — ABNORMAL LOW (ref 12.0–15.0)
MCV: 92.4 fL (ref 78.0–100.0)
Platelets: 267 10*3/uL (ref 150–400)
WBC: 6.3 10*3/uL (ref 4.0–10.5)

## 2010-07-28 LAB — URINALYSIS, ROUTINE W REFLEX MICROSCOPIC
Bilirubin Urine: NEGATIVE
Glucose, UA: NEGATIVE mg/dL
Hgb urine dipstick: NEGATIVE
Specific Gravity, Urine: 1.016 (ref 1.005–1.030)
Urobilinogen, UA: 1 mg/dL (ref 0.0–1.0)
pH: 7.5 (ref 5.0–8.0)

## 2010-07-28 LAB — COMPREHENSIVE METABOLIC PANEL
Alkaline Phosphatase: 45 U/L (ref 39–117)
BUN: 23 mg/dL (ref 6–23)
CO2: 23 mEq/L (ref 19–32)
Chloride: 110 mEq/L (ref 96–112)
Creatinine, Ser: 1.06 mg/dL (ref 0.4–1.2)
GFR calc non Af Amer: 54 mL/min — ABNORMAL LOW (ref >60)
Glucose, Bld: 118 mg/dL — ABNORMAL HIGH (ref 70–99)
Total Bilirubin: 0.6 mg/dL (ref 0.3–1.2)

## 2010-07-28 LAB — DIFFERENTIAL
Eosinophils Absolute: 0.1 10*3/uL (ref 0.0–0.7)
Eosinophils Relative: 1 % (ref 0–5)
Lymphs Abs: 2.4 10*3/uL (ref 0.7–4.0)
Monocytes Absolute: 0.4 10*3/uL (ref 0.1–1.0)

## 2010-07-28 LAB — VALPROIC ACID LEVEL: Valproic Acid Lvl: 30.2 ug/mL — ABNORMAL LOW (ref 50.0–100.0)

## 2010-08-12 ENCOUNTER — Other Ambulatory Visit: Payer: Self-pay | Admitting: *Deleted

## 2010-08-12 MED ORDER — HYDROCODONE-ACETAMINOPHEN 5-500 MG PO TABS: 1.0000 | ORAL_TABLET | Freq: Four times a day (QID) | ORAL | Status: DC | PRN

## 2010-08-12 NOTE — Telephone Encounter (Signed)
Rx called to [facility]. 

## 2010-08-22 ENCOUNTER — Other Ambulatory Visit: Payer: Self-pay | Admitting: *Deleted

## 2010-08-22 ENCOUNTER — Other Ambulatory Visit: Payer: Self-pay | Admitting: Family Medicine

## 2010-08-23 MED ORDER — TRAMADOL HCL 50 MG PO TABS: 50.0000 mg | ORAL_TABLET | Freq: Four times a day (QID) | ORAL | Status: DC | PRN

## 2010-09-02 ENCOUNTER — Other Ambulatory Visit: Payer: Self-pay | Admitting: Family Medicine

## 2010-09-03 NOTE — Telephone Encounter (Signed)
Rx called to [facility]. 

## 2010-09-06 NOTE — Discharge Summary (Signed)
[name], [name]                           ACCOUNT NO.:  [id number]   MEDICAL RECORD NO.:  [id number]                   PATIENT TYPE:  INP   LOCATION:  2013                                 FACILITY:  [facility]   PHYSICIAN:  Olga Millers, M.D.                DATE OF BIRTH:  [birth year]   DATE OF ADMISSION:  04/06/2003  DATE OF DISCHARGE:  04/08/2003                                 DISCHARGE SUMMARY   DISCHARGE DIAGNOSES:  1. Non-obstructive coronary artery disease.  2. Hypertension, treated.  3. Hyperlipidemia, treated.  4. Gastroesophageal reflux disease.  5. Sleep apnea.  6. Nephrolithiasis.   HOSPITAL COURSE:  Ms. Lori Phillips is a 55 year old female with a past medical  history of hypertension, hyperlipidemia, hypothyroidism, anxiety, and reflux  who presented for evaluation of chest pain with an abnormal nuclear study.  The stress test was performed at [facility] interpreted as mild  ischemia in the proximal anterior wall with an EF of 55%  by coronary  catheterization.  She was found to have minimal irregularities in the  __________.  Other lab studies included normal cardiac isoenzymes.  A BUN of  11, creatinine 0.8.  Platelets 276, hemoglobin 14.9, hematocrit 43.  EKG  reveals normal sinus rhythm, rate 86, with no acute ST-T wave changes.   DISCHARGE MEDICATIONS:  1. Stopping her Toprol.  2. Cardizem CD 120 mg a day.  3. Aspirin 81 mg a day.  4. To resume all previous home medications.  These include:  Levoxyl 200 mcg     a day, calcium , __________, Lipitor 40 mg __________,   ACTIVITY:  No heavy lifting or driving for two days.  To gradually increase  activity.   DIET:  Low fat, low cholesterol diet.   Call with any questions or concerns.   FOLLOWUP:  Follow up with Dr. Olga Millers on April 28, 2003, at 2 p.m.      Guy Franco, P.A. [facility]                      Olga Millers, M.D.    LB/MEDQ  D:  04/08/2003  T:  04/09/2003  Job:  [id number]   cc:   Dr.  Verner Mould

## 2010-09-06 NOTE — H&P (Signed)
[facility]  Patient:    Lori Phillips, Lori Phillips Visit Number: [id number] MRN: [id number]          Service Type: Attending:  Marina Gravel, M.D. Dictated by:   Marina Gravel, M.D. Adm. Date:  05/18/01                           History and Physical  DATE OF BIRTH:                1956-04-18  SOCIAL SECURITY NUMBER:       [id number]  CHIEF COMPLAINT:              Menorrhagia not responding to medical management.  HISTORY OF PRESENT ILLNESS:   A 55 year old white female gravida 3 para 3 who has a history of menorrhagia and dysmenorrhea.  This has been increasing over the last three months with heavy vaginal bleeding with associated clots and severe pain.  Endometrial biopsy in the office showed proliferative endometrium.  Attempts were made at medical management with cyclic and then continuous progestins, neither of which improved the patients bleeding. Subsequent pelvic ultrasound showed simply a thickened endometrium, no evidence of uterine fibroids or intracavitary masses.  Given the progressive and severe nature of the bleeding and the fact that it is not responding to medical management, the patient presents for medical management.  PAST MEDICAL HISTORY:         Hypothyroidism, kidney stones, sleep apnea.  PAST SURGICAL HISTORY:        Tubal ligation, multiple urological procedures for kidney stones - one of which was an open procedure at [facility], and tonsils.  MEDICATIONS:                  Synthroid 200 mcg, Wellbutrin, and Midrin.  ALLERGIES:                    None.  SOCIAL HISTORY:               No tobacco, occasional alcohol, no other drugs.  FAMILY HISTORY:               Hypertension.  REVIEW OF SYMPTOMS:           Otherwise noncontributory.  PHYSICAL EXAMINATION:  VITAL SIGNS:                  Blood pressure 152/90, weight 183, height 5 feet 6 inches.  NECK:                         Supple, no thyromegaly.  LUNGS:                         Clear.  HEART:                        Regular rate and rhythm.  ABDOMEN:                      Liver and spleen normal, no hernia. Periumbilical incision noted.  LYMPH NODE:                   Survey negative at the neck, axilla, and groin.  SKIN:  Normal, no lesions.  BREAST:                       No dominant masses, nipple discharge, or adenopathy.  PELVIC:                       Normal external genitalia, vagina and cervix normal.  Uterus slightly enlarged, 10-12 weeks size.  Good descent and adequate room for a vaginal hysterectomy noted.  Adnexal masses are not palpable.  Urethral meatus, urethra, bladder base, anus, perineum - all normal.  ASSESSMENT:                   Menorrhagia and dysmenorrhea not responding to medical management, desires definitive surgical treatment.  Planned for a vaginal hysterectomy on May 18, 2001.  Operative risks discussed including infection; bleeding; damage to bowel, bladder, or surrounding organs.  All questions answered, patient wishes to proceed. Dictated by:   Marina Gravel, M.D. Attending:  Marina Gravel, M.D. DD:  05/17/01 TD:  05/17/01 Job: [id number]

## 2010-09-06 NOTE — H&P (Signed)
NAMEMarland Phillips  ASTRYD, PEARCY NO.:  [id number]   MEDICAL RECORD NO.:  [id number]                   PATIENT TYPE:  IPS   LOCATION:  0301                                 [facility]   PHYSICIAN:  Jeanice Lim, M.D.              DATE OF BIRTH:  [birth year]   DATE OF ADMISSION:  10/09/2003  DATE OF DISCHARGE:                         PSYCHIATRIC ADMISSION ASSESSMENT   DATE OF ASSESSMENT:  October 10, 2003   PATIENT IDENTIFICATION:  This is a 55 year old separated white female who is  a voluntary admission.   HISTORY OF PRESENT ILLNESS:  This is the second [facility] admission for this 55 year old white female with a history of bipolar  disorder who was discharged from here at the end of May.  She reports that  she had been doing quite well at home after being discharged until her  husband told her that he was going to leave the marriage in favor of  returning to his previous ex-wife.  The patient feels that he has abandoned  her and deceived her because he had told her that he loved her and did not  want to go back to the ex-wife.  She also feels that her mother-in-law has  been plotting to reunite the husband and his ex-wife.  The patient feels  betrayed and learned that he would leave to return to the ex-wife this past  Friday.  Over this past weekend, the patient has had intense feelings of  anger and jealousy with thoughts of wanting to shoot the ex-wife and the  mother-in-law and has apparent access to guns around the home and through  friends.  She reports that she has been compliant with medications, denies  any suicidal thought but does endorse feeling depressed, worthless, and sad  with frequent episodes of tearfulness and disrupted sleep because of  agitated thoughts because of her anger.  She is also quite frustrated  because she feels that her daughters should be more supportive of her with  her bipolar disorder and the  only one who seems to have been understanding  or supportive is the oldest daughter named Lori Phillips.  The patient denies any  hallucinations or feelings paranoia.   PAST PSYCHIATRIC HISTORY:  The patient is followed by [name],  M.D., in [place], [place], and she has an appointment to establish  with a psychotherapist named Dionne Milo which was originally set for  June 23 for her appointment to establish.  This will be rescheduled.  This  is the patient's second admission to [facility]  with the last admission being approximately one month ago for the same  problems of feeling homicidal jealousy toward the husband's ex-wife.  The  patient has a history of prior unsuccessful attempts at suicide with prior  thoughts of shooting herself and allowing someone to restrain her or talk  her  out of it.  No prior attempts at homicide.   SUBSTANCE ABUSE HISTORY:  The patient reports rare use of alcohol and no  history of substance abuse.   PAST MEDICAL HISTORY:  The patient is followed by [name], M.D., in  [place], [place], which is where the ex-husband resided.  She has  been followed for rheumatoid arthritis by Dr. [name], rheumatologist in  [place], [place].  She plans to establish with physicians  locally.  Medical problems include rheumatoid arthritis and migraine  headaches.   MEDICATIONS:  1. Wellbutrin XL 300 mg p.o. q.a.m.  2. Depakote 250 mg q.a.m. and 500 mg p.o. q.h.s.  3. Valium 5 mg t.i.d. and one half tablet p.r.n. for anxiety which she is     actually taking the half tablet p.r.n. one or two times a day.  4. Seroquel 25 mg b.i.d., 25 mg p.r.n. for agitation, and 125 mg q.h.s.  5. Lipitor 40 mg daily.  6. Cardia XT 180 mg daily.  7. Levoxyl 200 mcg daily.  8. Plaquenil 400 mg daily.  9. Prednisone 2.5 mg daily.  This has been increased two weeks ago from 1 mg     daily by her rheumatologist.  10.       Premarin 0.3 mg once daily.  11.      Folic acid 1 mg q.a.m.  12.      Calcium 600 mg daily.  13.      Multivitamin one tablet daily.  14.      ASA 345 mg daily.  15.      Imitrex 100 mg p.o. at onset of headache, may be repeated x 1 in     two hours, maximum of 200 mg in 24 hours.   It should be noted that the patient was started on Depakote ER originally  over a year ago to control her migraine headaches; however, her dosage was  increased to the current dosage on her last admission to stabilize her mood.   DRUG ALLERGIES:  None.   PHYSICAL EXAMINATION:  GENERAL:  This is a well nourished, well developed,  obese female with a rather rounded moon face and a slight buffalo hump,  stooped posture.  She is 5 feet 5 inches tall, weighs 209 pounds.  VITAL SIGNS:  On admission to the unit, temperature 97.8, pulse 72,  respirations 18, blood pressure 157/89.  HEENT:  Sclerae are nonicteric.  Slight exophthalmic appearance to her eyes.  EOM are within normal limits.  No rhinorrhea.  Oropharynx: Within normal  limits.  NECK:  Supple, no thyromegaly.  CHEST:  Lungs clear to auscultation.  BREAST:  Exam deferred.  CARDIOVASCULAR:  S1 and S2 heard, no clicks, murmurs, or gallops.  ABDOMEN:  Rounded, soft, nontender, no masses appreciated.  GENITALIA:  Deferred.  EXTREMITIES:  Without edema or rash.  SKIN:  Intact.  No tattoos or signs of self-mutilation.  NEUROLOGIC:  Cranial nerves II-XII are intact.  EOM are intact, no  nystagmus.  Motor is smooth.  Sensory is grossly intact.  Grip strength  equal bilaterally.  Gait: Normal.  Normal arm swing.  Deep tendon reflexes:  Within normal limits.  Neurologic is nonfocal.   LABORATORY DATA:  Current TSH and valproic acid levels are pending.  CBC was  within normal limits.  Electrolytes, liver enzymes, and renal function were  all normal.  SOCIAL HISTORY:  This patient was previously widowed from a previous married  and had been  married to the  current husband for a little bit less than a  year.  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  She has three daughters  in the area and Amber, her youngest, will move in with her with a  grandchild.  This is the daughter that the patient feels is not supportive  of her.  The patient has no legal charges.   FAMILY HISTORY:  Family history is remarkable for a father with undiagnosed  mood problems.   MENTAL STATUS EXAM:  This is a fully alert female but her speech is slightly  slurred today.  She is cooperative.  She attributes her slurred speech and  slightly slowed responses to coming on the unit in the middle of the night  and receiving of Ambien in the early morning hours, feels she has a little  bit of a hangover today.  Speech is slurred but otherwise normal in pace and  amount.  She is fairly articulate.  Mood is depressed and irritable, feels  guilty and worthless but angry toward her husband, feeling that she had been  unjustifiably wronged, feels that her mother-in-law has been plotting  against her, feels that her daughter should be much more sympathetic toward  her and understand her and wishes that someone would just listen to her  concerns about herself and her situation.  Thought process is logical and  coherent, vague positive suicidal ideation, clear, homicidal ideation with a  plan to shoot the mother-in-law or the ex-wife if she were to see them  again.  She is not plotting to pursue them at this point.  She feels  embarrassed by her situation and angry.  Cognitive: Intact and oriented x 3.  No flight of ideas, no evidence of hallucinations or psychosis.  Intelligence is average.  Insight: Satisfactory.  Impulse control and  judgment: Within normal limits.   ADMISSION DIAGNOSES:   AXIS I:  Bipolar disorder, not  otherwise specified, depressed.   AXIS II:  Deferred.   AXIS III:  1. Rheumatoid arthritis.  2. Migraine headache.   AXIS IV:  Severe marital and family conflict with lack of adequate support.   AXIS V:  Current 25, past year 17.   INITIAL PLAN OF CARE:  Plan is to voluntarily admit the patient with q.71m.  checks in place to alleviate her homicidal ideation toward her husband and  her mother-in-law and to strengthen her coping mechanisms.  She has been  interacting appropriately in intensive group and individual psychotherapy.  We are going to ask the case manager to make sure that weapons are secured  and talk to her daughters about the possibility of maybe repeating a family  session if the patient feels it would be beneficial.  She is going to think  about it.  We have asked her to talk about her situation and gain some more support in group therapy.  Meanwhile, we are going to check a valproic acid  level and will decrease her Seroquel to 100 mg p.o. q.h.s. and leave her on  25 mg q.6.h. p.r.n.   ESTIMATED LENGTH OF STAY:  Five days.     Margaret A. Lorin Picket, N.P.                   Jeanice Lim, M.D.    MAS/MEDQ  D:  10/10/2003  T:  10/11/2003  Job:  [id number]

## 2010-09-06 NOTE — Cardiovascular Report (Signed)
Lori Phillips, Lori Phillips                           ACCOUNT NO.:  [id number]   MEDICAL RECORD NO.:  [id number]                   PATIENT TYPE:  INP   LOCATION:  2013                                 FACILITY:  [facility]   PHYSICIAN:  Arturo Morton. Riley Kill, M.D.             DATE OF BIRTH:  21-Dec-1955   DATE OF PROCEDURE:  04/07/2003  DATE OF DISCHARGE:                              CARDIAC CATHETERIZATION   INDICATIONS:  The patient is a 55 year old who has had previous cardiac  catheterization study at [facility] in the year 2000.  According to  the patient's history, this was unremarkable.  The current study was done  after a Cardiolite scan revealed an abnormal study with anterior ischemia.  The current study was done to assess coronary anatomy after evaluation by  Olga Millers, M.D.   PROCEDURE:  1. Left heart catheterization.  2. Selective coronary arteriography.  3. Selective left ventriculography.  4. Aortic root aortography.  5. Intracoronary nitroglycerin administration.   DESCRIPTION OF PROCEDURE:  The patient was brought to the catheterization  laboratory and prepped and draped in the usual fashion.  Through an anterior  puncture the right femoral artery was easily entered.  A 6-French sheath was  placed.  Left ventriculography and proximal root aortography were then  performed without complication.  With engagement of the right coronary  artery there was initially coronary ostial spasm and therefore intraarterial  nitroglycerin was administered x2.  Views of the left coronary artery were  then obtained in multiple angiographic projections.  We then came back and  using a _________ catheter entered the right coronary artery without  difficulty.  Angiographic views were obtained.  There were no complications.  She was taken to the holding area in satisfactory clinical condition.   HEMODYNAMIC DATA:  1. Central aortic pressure 154/94, mean 119.  2. Left ventricle 149/13.  3.  No gradient on pullback across the aortic valve.   ANGIOGRAPHIC DATA:  1. Ventriculography was performed in the RAO projection.  There was     ventricular ectopy so a reliable ejection fraction could not be     calculated.  However, the EF appeared to be in excess of 55% and no     definite wall motion abnormalities were identified.  2. Aortic root aortography was done to exclude aortic dissection as a     diagnosis.  There was no significant aortic regurgitation and no evidence     of significant aortic dissection.  3. The left main coronary artery was fairly short and had an upward takeoff     requiring a JL3.5 guide to enter.  The left main appeared to be free of     critical disease.  4. The LAD coursed to the apex.  It was a fairly large caliber vessel with     several smaller diagonal branches.  The LAD and its branches were free of  critical disease.  5. The circumflex provided a smaller first marginal branch that perhaps had     minimal luminal irregularity near its ostium but this was not definite.     There was a larger second marginal branch that bifurcated distally.  It     supplied the anterolateral wall and was free of critical disease.  6. The right coronary artery is a dominant vessel.  There is an upward     takeoff initially followed by some tortuosity.  There is a small PDA     branch followed by a major posterior descending branch and then a smaller     posterolateral system with a fair amount of tortuosity.  None of these     vessels had significant focal narrowing.   CONCLUSIONS:  1. Well preserved left ventricular function.  2. No evidence of aortic regurgitation or significant dissection.  3. No critical coronary artery lesions.  4. Ostial spasm of the right coronary artery initially relieved with     nitroglycerin administration.   DISPOSITION:  The patient had recurrent chest pain last night.  Some of this  may be related to anxiety and probably is not  coronary mediated.  Coronary  spasm could not be excluded, although she has not had any obvious definite  EKG changes.  For her hypertension, switching her from beta blockade to  calcium channel blockers may be helpful given the patient's response to  sublingual nitroglycerin.  D-dimer will be checked.  Further evaluation as  needed.                                               Arturo Morton. Riley Kill, M.D.    TDS/MEDQ  D:  04/07/2003  T:  04/07/2003  Job:  [id number]   cc:   [name], M.D.  [place], [place]  [facility]  [address] [place] [place] [address]   Olga Millers, M.D.   [facility]   Lucky Cowboy, M.D., [facility]  [facility]
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
## 2010-09-06 NOTE — Discharge Summary (Signed)
[facility]  Patient:    Lori Phillips, Lori Phillips Visit Number: [id number] MRN: [id number]          Service Type: OBV Location: 9300 [id number] Attending Physician:  Ermalene Searing Dictated by:   Marina Gravel, M.D. Admit Date:  05/18/2001 Discharge Date: 05/19/2001                             Discharge Summary  ADMISSION DIAGNOSIS:  Menorrhagia not responding to medical management.  DISCHARGE DIAGNOSIS:  Menorrhagia not responding to medical management.  PROCEDURE:  Total vaginal hysterectomy.  HISTORY OF PRESENT ILLNESS:  For complete details, please see the history and physical in chart.  Briefly, the patient is a 55 year old white female, gravida 3, para 3, with a history of severe menorrhagia, not responsive to medical management. Preoperative evaluation, including endometrial biopsy and pelvic ultrasound, both of which were unremarkable.  The patient presents for definitive surgical therapy.  HOSPITAL COURSE:  On the day of admission, the patient underwent total vaginal hysterectomy without complications.  She tolerated the procedure well. Postoperatively, the patient rapidly regained her ability to ambulate and void, and tolerated a regular diet.  She was discharged home on the first postoperative day in satisfactory condition.  Was noted to have mild anemia, however, was asymptomatic, and will be discharged home on iron supplementation.  DISCHARGE MEDICATIONS: 1. Vicodin one or two p.o. q.4-6h. p.r.n. pain. 2. Ibuprofen 600 mg q.6h. p.r.n. pain. 3. Ferrous sulfate 325 mg p.o. b.i.d. for anemia.  ACTIVITY:  No heavy lifting, and nothing in the vagina for six weeks.  No driving for two weeks.  Notify with fever, pain, nausea, vomiting, or other concerns.  FOLLOWUP:  [facility] in four weeks.  CONDITION ON DISCHARGE:  Satisfactory. Dictated by:   Marina Gravel, M.D. Attending Physician:  Marina Gravel B DD:  05/19/01 TD:   05/19/01 Job: [id number]

## 2010-09-06 NOTE — Op Note (Signed)
[facility]  Patient:    Lori Phillips, Lori Phillips Visit Number: [id number] MRN: [id number]          Service Type: OBV Location: 9300 [id number] Attending Physician:  Ermalene Searing Dictated by:   Marina Gravel, M.D. Proc. Date: 05/18/01 Admit Date:  05/18/2001                             Operative Report  PREOPERATIVE DIAGNOSIS:       Menorrhagia unresponsive to medical management.  POSTOPERATIVE DIAGNOSIS:      Menorrhagia unresponsive to medical management.  OPERATION:                    Total vaginal hysterectomy and McCalls culdoplasty.  SURGEON:                      Marina Gravel, M.D.  ASSISTANT:                    Lenoard Aden, M.D.  ANESTHESIA:                   General.  FINDINGS:                     A 10 week sized uterus, normal appearing tubes and ovaries, large posterior cul-de-sac.  ESTIMATED BLOOD LOSS:         50 cc.  URINE OUTPUT:                 50 cc after the case was over and 150 cc preoperative.  FLUIDS:                       1500.  SPECIMEN:                     Uterus and cervix.  DRAINS:                       Foley.  COMPLICATIONS:                None.  INDICATIONS:                  Patient with a history of menorrhagia. Endometrial biopsy showed proliferative endometrium.  Patient was managed initially with cyclic and then continuous progestins neither of which stopped her bleeding.  In addition, pelvic ultrasound showed no suggestion for endometrial polyps.  She therefore presents for definitive surgical therapy. Patient had also mentioned history of left lower quadrant pain and had previously been noted to have a simple left ovarian cyst on ultrasound.  PROCEDURE:                    Patient was taken to the operating room and general anesthesia obtained.  She was prepped and draped in the standard fashion and the bladder emptied with a Red rubber catheter.  A weighted speculum was placed in the vagina and the  cervix grasped anteriorly and posteriorly on the lip with Christella Hartigan tenaculum.  Dilute Pitressin solution 10 cc was infiltrated into the vaginal tissue.  The posterior cul-de-sac was entered sharply with curved Mayo scissors.  A long weighted speculum was then inserted and the uterosacral ligaments clamped on each side with curved Heaney clamps, divided sharply, suture ligated with 0 Vicryl with hemostasis and tagged for  later identification.  The remainder of the vaginal mucosa was then circumscribed with Bovie cautery.  The cervicovesical space was then developed sharply and the anterior cul-de-sac entered sharply.  Curved Deaver retractor was then inserted into the anterior cul-de-sac.  The cardinal ligaments were then serially clamped on each side with the LigaSure and divided sharply with hemostasis obtained.  This was continued superiorly to the level of each uterine cornu.  The uterus was then flipped so that the fundus was directed posteriorly into the introitus. Intrauterine cornu was then clamped with curved Heaney clamp, divided sharply, and suture ligated x2 with 0 Vicryl with hemostasis obtained.  Uterus was submitted with cervix attached to this intact specimen for pathologic examination.  Each ovary and tube were inspected.  Both appeared normal. Patients previously noted left simple ovarian cyst has resolved.  Therefore, each ovary was left in situ.  The vaginal incision was then closed vertically with interrupted figure-of-eight sutures of 0 Vicryl.  Prior to closure of the posterior aspect a McCalls suture was placed.  This was initiated from the outside aspect to the posterior portion of the vagina.  Subsequently, the uterosacral ligament was incorporated and a running suture placed along the posterior peritoneum and subsequently attached to the contralateral uterosacral ligament and back out through the posterior vagina and tagged for later tying.  The remainder of the  vaginal cuff was then closed with interrupted figure-of-eight sutures of 0 Vicryl.   Care was taken to avoid the underlying internal organs.  The culdoplasty suture was then tied down with good obliteration of the posterior cul-de-sac.  The vagina was then inspected and was hemostatic.  No further repairs were necessary as the patient did not have any significant cystocele or rectocele.  A Foley catheter was inserted in the bladder and clear urine returned.  Rectal examination showed no abnormalities.  Prior to the rectal examination the vagina was packed with an Estrace coated vaginal pack.  Patient tolerated procedure well and there were no complications.  She was taken to the recovery room awake, alert, in stable condition. Dictated by:   Marina Gravel, M.D. Attending Physician:  Marina Gravel B DD:  05/18/01 TD:  05/18/01 Job: [id number]

## 2010-09-06 NOTE — Discharge Summary (Signed)
NAME:  Lori Phillips, Lori Phillips NO.:  [id number]   MEDICAL RECORD NO.:  [id number]                   PATIENT TYPE:  IPS   LOCATION:  0301                                 FACILITY:  [facility]   PHYSICIAN:  Jeanice Lim, M.D.              DATE OF BIRTH:  1955-04-24   DATE OF ADMISSION:  10/09/2003  DATE OF DISCHARGE:  10/15/2003                                 DISCHARGE SUMMARY   IDENTIFYING DATA:  This is a 55 year old separated Caucasian female  voluntarily admitted, presenting to [facility] with a history  of bipolar disorder, had been discharged here at the end of May, reporting  doing quite well until husband told her that he was going to leave the  marriage in favor of returning to his previous ex-wife.  The patient felt  abandoned because he had told her that he loved her and did not want to go  back to the ex-wife, felt the mother-in-law had been plotting to reunite  husband and ex-wife.  The patient felt betrayed and homicidal thoughts  towards ex-mother-in-law and ex-wife, wanting to shoot the ex-wife and  mother-in-law and had apparent access to guns.  The patient had fleeting  suicidal thoughts and more strong homicidal ideation than suicidal ideation.  Denied any psychotic symptoms, followed by Milford Cage in [place] and  Dionne Milo for therapy.  Positive history of unsuccessful suicide  attempts.  The patient reported rare use of alcohol and no substance abuse.   ADMISSION MEDICATIONS:  Wellbutrin XL 300 q.a.m., Depakote, Valium,  Seroquel, and Lipitor, Cardia XT, Levoxyl, Plaquenil, prednisone, Premarin,  folic acid, calcium, multivitamin, aspirin and Imitrex.  The patient had  been started on Depakote ER originally to control migraine headaches and  doses adjusted to stabilize mood.   ALLERGIES:  No known drug allergies.   PHYSICAL EXAMINATION:  Within normal limits, neurologically nonfocal.   ROUTINE ADMISSION LABS:   Essentially within normal limits.   MENTAL STATUS EXAM:  Fully alert female, speech slightly slurred,  cooperative, attributing slurred speech and slightly slowed responses to  coming on unit at night and receiving Ambien in the early morning hours,  feeling a little bit hung over.  Otherwise speech was within normal limits,  mood depressed, irritable, feeling guilty, worthless, angry towards husband,  feeling victimized and somewhat suspicious regarding the mother-in-law,  feeling also that daughter should be more sympathetic.  Vague positive  suicidal ideation and clear homicidal ideation, although no acute intent in  pursuing them at this time, feeling embarrassed by situation and angry,  cognitively intact.  No flight of ideas, no psychosis.  Judgment and insight  somewhat impaired with questionable impulse control.   ADMISSION DIAGNOSES:   AXIS I:  Bipolar disorder not otherwise specified, depressed phase.   AXIS II:  Deferred.   AXIS III:  Rheumatoid arthritis and migraine headaches.   AXIS IV:  Severe,  marital and family conflict with limited support system.   AXIS V:  25/65.   HOSPITAL COURSE:  The patient was admitted and ordered routine p.r.n.  medications, underwent further monitoring, and was encouraged to participate  in individual, group and milieu therapy.  Securing weapons was assured and  family was also contacted regarding family session.  Depakote level was  monitored and the patient was optimized on medications, encouraged to  participate in individual, group and milieu therapy to learn healthier  coping skills to deal with anger in addition to pharmacologic targeting of  mood instability and agitation.  The patient was resumed on all medications  including medical medications and then optimized on Seroquel and Valium as  well as Depakote after level obtained.  Family meeting with younger  daughters to provide healthier support, education and for aftercare  planning  was completed and went well.  The patient reported positive response to  medication changes and clinical intervention and have increased insight,  more appropriate coping skills and was appropriate on the unit, showing no  dangerous ideation.  She was discharged with euthymic mood, stable affect,  no agitation, no anger or hostility, no suicidal or homicidal ideation,  having regretted making these statements, feeling that there were better  ways for her to move on then to act on previous anger.  The patient was  given medication education and discharged again with no dangerous ideation  and no risk issues on:  1. Wellbutrin XL 300 mg q.a.m.  2. Lipitor 40 mg daily.  3. Diltiazem 180 mg q.a.m.  4. Synthroid 200 mcg q.a.m.  5. Plaquenil 200 mcg 2 pills in the morning.  6. Prednisone 2.5 daily.  7. Depakote 250 mg q.a.m. and 750 q.h.s.  8. Premarin 0.3 daily.  9. Folic acid 1 mg in the morning.  10.      Seroquel 100 mg 1/2 pill twice a day and 2 pills at bedtime.  11.      Valium 5 mg 4 times a day and 1-2 q.day p.r.n. anxiety.   DISPOSITION:  The patient was to follow up with Dr. Katrinka Blazing, Thursday June 30,  3 p.m.  The patient also has a therapy appointment with Dionne Milo.   DISCHARGE DIAGNOSES:   AXIS I:  Bipolar disorder not otherwise specified, depressed phase.   AXIS II:  Deferred.   AXIS III:  Rheumatoid arthritis and migraine headaches.   AXIS IV:  Severe, marital and family conflict with limited support system.   AXIS V:  Global assessment of function on discharge was 55.                                               Jeanice Lim, M.D.    JEM/MEDQ  D:  11/09/2003  T:  11/09/2003  Job:  [id number]

## 2010-09-17 ENCOUNTER — Other Ambulatory Visit: Payer: Self-pay | Admitting: Family Medicine

## 2010-09-17 ENCOUNTER — Encounter: Payer: Self-pay | Admitting: Internal Medicine

## 2010-09-20 ENCOUNTER — Other Ambulatory Visit: Payer: Self-pay | Admitting: *Deleted

## 2010-09-20 ENCOUNTER — Other Ambulatory Visit: Payer: Self-pay | Admitting: Family Medicine

## 2010-09-20 MED ORDER — GLIPIZIDE-METFORMIN HCL 2.5-500 MG PO TABS: 2.0000 | ORAL_TABLET | Freq: Two times a day (BID) | ORAL | Status: DC

## 2010-10-02 ENCOUNTER — Other Ambulatory Visit: Payer: Self-pay | Admitting: Family Medicine

## 2010-10-03 ENCOUNTER — Other Ambulatory Visit: Payer: Self-pay | Admitting: *Deleted

## 2010-10-03 NOTE — Telephone Encounter (Signed)
Opened in error

## 2010-10-15 ENCOUNTER — Other Ambulatory Visit: Payer: Self-pay | Admitting: Family Medicine

## 2010-10-15 NOTE — Telephone Encounter (Signed)
Rx called to [facility]. 

## 2010-10-22 ENCOUNTER — Other Ambulatory Visit: Payer: Self-pay | Admitting: Family Medicine

## 2010-10-28 ENCOUNTER — Other Ambulatory Visit: Payer: Self-pay | Admitting: Family Medicine

## 2010-10-29 NOTE — Telephone Encounter (Signed)
Rx called to [facility]. 

## 2010-11-12 ENCOUNTER — Other Ambulatory Visit: Payer: Self-pay | Admitting: Family Medicine

## 2010-11-22 ENCOUNTER — Encounter: Payer: Self-pay | Admitting: Family Medicine

## 2010-11-22 ENCOUNTER — Ambulatory Visit (INDEPENDENT_AMBULATORY_CARE_PROVIDER_SITE_OTHER): Payer: Medicare Other | Admitting: Family Medicine

## 2010-11-22 DIAGNOSIS — R300 Dysuria: Secondary | ICD-10-CM

## 2010-11-22 DIAGNOSIS — E785 Hyperlipidemia, unspecified: Secondary | ICD-10-CM

## 2010-11-22 DIAGNOSIS — I10 Essential (primary) hypertension: Secondary | ICD-10-CM

## 2010-11-22 DIAGNOSIS — E039 Hypothyroidism, unspecified: Secondary | ICD-10-CM

## 2010-11-22 DIAGNOSIS — M129 Arthropathy, unspecified: Secondary | ICD-10-CM

## 2010-11-22 DIAGNOSIS — M545 Low back pain: Secondary | ICD-10-CM

## 2010-11-22 DIAGNOSIS — E119 Type 2 diabetes mellitus without complications: Secondary | ICD-10-CM

## 2010-11-22 MED ORDER — DILTIAZEM HCL ER BEADS 180 MG PO CP24: 180.0000 mg | ORAL_CAPSULE | Freq: Every day | ORAL | Status: DC

## 2010-11-22 MED ORDER — LISINOPRIL 5 MG PO TABS: 5.0000 mg | ORAL_TABLET | Freq: Every day | ORAL | Status: DC

## 2010-11-22 MED ORDER — OXYCODONE HCL 5 MG PO TABS: 5.0000 mg | ORAL_TABLET | ORAL | Status: AC | PRN

## 2010-11-22 MED ORDER — ROSUVASTATIN CALCIUM 20 MG PO TABS: 20.0000 mg | ORAL_TABLET | Freq: Every day | ORAL | Status: DC

## 2010-11-22 MED ORDER — TRAMADOL HCL 50 MG PO TABS: 50.0000 mg | ORAL_TABLET | Freq: Three times a day (TID) | ORAL | Status: DC | PRN

## 2010-11-22 NOTE — Progress Notes (Signed)
55 yo here to follow up several issues.    DM- does not check her CBGs regulary.  Currently on Glipize-Metformin 2.5/500 mg- 2 tabs two times a day, Lantus 36 units daily.   Lab Results  Component Value Date   HGBA1C 7.9* 07/23/2010    Lab Results  Component Value Date   CREATININE 0.9 07/23/2010    Hypothyroidism- was on  Levothyroxiine 100 micrograms daily. Denies palpitations or hot/cold intolerance.   HTN- has been well controlled on Lisinopril 5 mg daily and Diltiazam 180 mg at bedtime. BP elevated today but has not taken her medication today. BP Readings from Last 3 Encounters:  11/22/10 152/80  07/23/10 150/80  02/21/10 140/90    Chronic pain- feels like it's worse.  Taking tylenol in between her doses of Hydrocodone.     Review of Systems       See HPI  The PMH, PSH, Social History, Family History, Medications, and allergies have been reviewed in [facility], and have been updated if relevant.  Physical Exam BP 152/80  Pulse 80  Temp(Src) 98 F (36.7 C) (Oral)  Wt 197 lb (89.359 kg)  General:  alert, well-developed, and well-nourished.   Head:  normocephalic and atraumatic.   Eyes:  vision grossly intact, pupils equal, pupils round, and pupils reactive to light.   Ears:  R ear normal and L ear normal.   Nose:  no external deformity.   Mouth:  Oral mucosa and oropharynx without lesions or exudates.  Teeth in good repair. Lungs:  Normal respiratory effort, chest expands symmetrically. Lungs are clear to auscultation, no crackles or wheezes. Heart:  Normal rate and regular rhythm. S1 and S2 normal without gallop, murmur, click, rub or other extra sounds. Abdomen:  Bowel sounds positive,abdomen soft and non-tender.  Some tightness of paraspinous muscles. Extremities:  no edema. Diabetic foot exam: Normal inspection No skin breakdown No calluses  Normal DP pulses Normal sensation to light tough and monofilament Nails normal Psych:  Cognition and judgment appear  intact. Alert and cooperative with normal attention span and concentration. No apparent delusions, illusions, hallucinations  Assessment and Plan:  1. HYPOTHYROIDISM  Unchanged.  Recheck TSH today.  2. DIABETES MELLITUS, TYPE II  Recheck a1c today.  3. LOW BACK PAIN  Will change to oxycodone as I am concerned she is using too much Acetaminophen. The patient indicates understanding of these issues and agrees with the plan.   4 HYPERTENSION  Deteriorated.  Advised to take her medication as soon as she gets home today.

## 2010-11-23 LAB — TSH: TSH: 20.644 u[IU]/mL — ABNORMAL HIGH (ref 0.350–4.500)

## 2010-11-23 LAB — HEMOGLOBIN A1C
Hgb A1c MFr Bld: 8 % — ABNORMAL HIGH (ref <5.7)
Mean Plasma Glucose: 183 mg/dL — ABNORMAL HIGH (ref <117)

## 2010-11-25 ENCOUNTER — Encounter: Payer: Self-pay | Admitting: Family Medicine

## 2010-11-26 ENCOUNTER — Other Ambulatory Visit: Payer: Self-pay | Admitting: *Deleted

## 2010-11-26 MED ORDER — LEVOTHYROXINE SODIUM 100 MCG PO TABS: 100.0000 ug | ORAL_TABLET | Freq: Every day | ORAL | Status: DC

## 2010-11-26 MED ORDER — COLCHICINE 0.6 MG PO TABS: 0.6000 mg | ORAL_TABLET | Freq: Every day | ORAL | Status: DC

## 2010-11-26 NOTE — Telephone Encounter (Signed)
Rx sent 

## 2010-11-26 NOTE — Telephone Encounter (Signed)
Patient called asking about the uric acid. I told her that it was 7.4, she is asking what she needs to do. Please advise.

## 2010-11-26 NOTE — Telephone Encounter (Signed)
Sorry I meant to mention that in result note. This is a elevated. If she is still in pain, we can start colcrys daily. If her symptoms are better, I would advise just watch and wait approach.

## 2010-11-26 NOTE — Telephone Encounter (Signed)
Addended by: Dianne Dun on: 11/26/2010 05:52 PM   Modules accepted: Orders

## 2010-11-26 NOTE — Telephone Encounter (Signed)
Patient advised as instructed via telephone.  She stated that she has flare ups at night, not bothered much during the day.  She would like Rx for Colcrys sent to [facility].  She has never been on Colcrys before but she would like it on hand in case she needs it.

## 2010-11-28 ENCOUNTER — Other Ambulatory Visit: Payer: Self-pay | Admitting: *Deleted

## 2010-11-28 MED ORDER — LEVOTHYROXINE SODIUM 112 MCG PO TABS: 112.0000 ug | ORAL_TABLET | Freq: Every day | ORAL | Status: DC

## 2010-11-28 NOTE — Telephone Encounter (Signed)
Pt states her synthroid dose was to have been increased but what was sent to pharmacy was 100 mcg's.  She says she has been on this dose for years.  Correct strength of 112 mcg's, per last lab result, sent to pharmacy

## 2010-12-19 ENCOUNTER — Other Ambulatory Visit: Payer: Self-pay | Admitting: Family Medicine

## 2010-12-19 DIAGNOSIS — E039 Hypothyroidism, unspecified: Secondary | ICD-10-CM

## 2010-12-24 ENCOUNTER — Other Ambulatory Visit (INDEPENDENT_AMBULATORY_CARE_PROVIDER_SITE_OTHER): Payer: Medicare Other

## 2010-12-24 ENCOUNTER — Other Ambulatory Visit: Payer: Self-pay | Admitting: Family Medicine

## 2010-12-24 DIAGNOSIS — R7989 Other specified abnormal findings of blood chemistry: Secondary | ICD-10-CM

## 2010-12-24 DIAGNOSIS — E039 Hypothyroidism, unspecified: Secondary | ICD-10-CM

## 2010-12-25 ENCOUNTER — Other Ambulatory Visit: Payer: Self-pay | Admitting: *Deleted

## 2010-12-25 ENCOUNTER — Encounter: Payer: Self-pay | Admitting: Family Medicine

## 2010-12-25 MED ORDER — OXYCODONE HCL 5 MG PO TABS: 5.0000 mg | ORAL_TABLET | ORAL | Status: AC | PRN

## 2010-12-25 MED ORDER — LEVOTHYROXINE SODIUM 125 MCG PO TABS: 125.0000 ug | ORAL_TABLET | Freq: Every day | ORAL | Status: DC

## 2010-12-25 NOTE — Telephone Encounter (Signed)
Pt is asking for refill on oxycodone 5 mg's.  This is no longer on med list, this was prescribed on 11/22/10.  Please call pt when ready.

## 2010-12-25 NOTE — Telephone Encounter (Signed)
Rx left at front desk for patient to pick up, patient advised via telephone.

## 2010-12-25 NOTE — Telephone Encounter (Signed)
Will refill... Seen in 11/22/2010.. placed on oxycodone to avoid tylenol overdosing.

## 2011-01-09 ENCOUNTER — Ambulatory Visit (INDEPENDENT_AMBULATORY_CARE_PROVIDER_SITE_OTHER): Payer: Medicare Other | Admitting: Family Medicine

## 2011-01-09 ENCOUNTER — Encounter: Payer: Self-pay | Admitting: Family Medicine

## 2011-01-09 ENCOUNTER — Telehealth: Payer: Self-pay

## 2011-01-09 VITALS — BP 110/72 | HR 74 | Temp 98.0°F | Wt 199.8 lb

## 2011-01-09 DIAGNOSIS — Z23 Encounter for immunization: Secondary | ICD-10-CM

## 2011-01-09 DIAGNOSIS — R233 Spontaneous ecchymoses: Secondary | ICD-10-CM | POA: Insufficient documentation

## 2011-01-09 LAB — CBC WITH DIFFERENTIAL/PLATELET
Basophils Absolute: 0 10*3/uL (ref 0.0–0.1)
HCT: 38.4 % (ref 36.0–46.0)
Hemoglobin: 12.6 g/dL (ref 12.0–15.0)
Lymphs Abs: 3.7 10*3/uL (ref 0.7–4.0)
MCV: 94.6 fl (ref 78.0–100.0)
Monocytes Absolute: 0.7 10*3/uL (ref 0.1–1.0)
Neutro Abs: 4.4 10*3/uL (ref 1.4–7.7)
Platelets: 248 10*3/uL (ref 150.0–400.0)
RDW: 13.3 % (ref 11.5–14.6)

## 2011-01-09 MED ORDER — LEVOTHYROXINE SODIUM 125 MCG PO TABS: 125.0000 ug | ORAL_TABLET | Freq: Every day | ORAL | Status: DC

## 2011-01-09 MED ORDER — OXYCODONE HCL 5 MG PO CAPS: 5.0000 mg | ORAL_CAPSULE | ORAL | Status: DC | PRN

## 2011-01-09 MED ORDER — BAYER MICROLET LANCETS MISC: Status: DC

## 2011-01-09 MED ORDER — COLCHICINE 0.6 MG PO TABS: 0.6000 mg | ORAL_TABLET | Freq: Every day | ORAL | Status: DC

## 2011-01-09 MED ORDER — PANTOPRAZOLE SODIUM 40 MG PO TBEC: 40.0000 mg | DELAYED_RELEASE_TABLET | Freq: Every day | ORAL | Status: DC

## 2011-01-09 MED ORDER — DIVALPROEX SODIUM ER 500 MG PO TB24: 500.0000 mg | ORAL_TABLET | Freq: Two times a day (BID) | ORAL | Status: DC

## 2011-01-09 MED ORDER — DULOXETINE HCL 60 MG PO CPEP: 60.0000 mg | ORAL_CAPSULE | Freq: Every day | ORAL | Status: DC

## 2011-01-09 MED ORDER — IODOQUINOL-HC 1 % EX CREA: TOPICAL_CREAM | CUTANEOUS | Status: DC

## 2011-01-09 MED ORDER — ROSUVASTATIN CALCIUM 20 MG PO TABS: 20.0000 mg | ORAL_TABLET | Freq: Every day | ORAL | Status: DC

## 2011-01-09 MED ORDER — INSULIN GLARGINE 100 UNIT/ML ~~LOC~~ SOLN: SUBCUTANEOUS | Status: DC

## 2011-01-09 MED ORDER — INSULIN PEN NEEDLE 31G X 5 MM MISC: Status: DC

## 2011-01-09 MED ORDER — GLIPIZIDE-METFORMIN HCL 2.5-500 MG PO TABS: 2.0000 | ORAL_TABLET | Freq: Two times a day (BID) | ORAL | Status: DC

## 2011-01-09 MED ORDER — TRAMADOL HCL 50 MG PO TABS: 50.0000 mg | ORAL_TABLET | Freq: Three times a day (TID) | ORAL | Status: DC | PRN

## 2011-01-09 NOTE — Telephone Encounter (Signed)
It is an antifungal-steroid cream used for redness and itching due to excema.

## 2011-01-09 NOTE — Telephone Encounter (Signed)
Spoke with Morrie Sheldon at [facility] and she said it was taken care of.

## 2011-01-09 NOTE — Progress Notes (Signed)
55 yo here for bruise on right midfoot.  Does not remember hitting her foot or anything other trauma. She was walking more the usual the day before this started (3 days ago).  She is on multiple medications and recently starting taking ASA 81 mg.   Never had anything like this before.  Pt is a diabetic but denies any neuropathy currently.    BP much better controlled today.  No CP or SOB.  Review of Systems       See HPI  The PMH, PSH, Social History, Family History, Medications, and allergies have been reviewed in [facility], and have been updated if relevant.  Physical Exam BP 110/72  Pulse 74  Temp(Src) 98 F (36.7 C) (Oral)  Wt 199 lb 12 oz (90.606 kg)  General:  alert, well-developed, and well-nourished.   Head:  normocephalic and atraumatic.   Eyes:  vision grossly intact, pupils equal, pupils round, and pupils reactive to light.   Ears:  R ear normal and L ear normal.   Nose:  no external deformity.   Mouth:  Oral mucosa and oropharynx without lesions or exudates.  Teeth in good repair. Lungs:  Normal respiratory effort, chest expands symmetrically. Lungs are clear to auscultation, no crackles or wheezes. Heart:  Normal rate and regular rhythm. S1 and S2 normal without gallop, murmur, click, rub or other extra sounds. Abdomen:  Bowel sounds positive,abdomen soft and non-tender.  Some tightness of paraspinous muscles. Extremities:   No edema, small petechia on medial portion of right midfoot, non tender, no erythema, no masses, FROM Psych:  Cognition and judgment appear intact. Alert and cooperative with normal attention span and concentration. No apparent delusions, illusions, hallucinations  Assessment and Plan:  1. Petechiae  CBC w/Diff   New.  Likely secondary to trauma or increased walking that she had done the day before. Will check CBC today. Does not seem consistent with DVT. Red flags given requiring immediate follow up. Hold ASA.

## 2011-01-09 NOTE — Telephone Encounter (Signed)
[name] from [facility] phone [phone] or fax [phone] left v/m in triage that pt was seen earlier today and the med, Vytone 1% cream is not [facility] and wonders if can give more detail of what Vytone cream is to see if they have a possible substitution. Please advise.

## 2011-01-09 NOTE — Progress Notes (Signed)
Addended by: Gilmer Mor on: 01/09/2011 11:03 AM   Modules accepted: Orders

## 2011-02-20 ENCOUNTER — Telehealth: Payer: Self-pay | Admitting: *Deleted

## 2011-02-20 ENCOUNTER — Telehealth: Payer: Self-pay

## 2011-02-20 MED ORDER — OXYCODONE HCL 5 MG PO CAPS: 5.0000 mg | ORAL_CAPSULE | ORAL | Status: DC | PRN

## 2011-02-20 MED ORDER — TRAMADOL HCL 50 MG PO TABS: 50.0000 mg | ORAL_TABLET | Freq: Three times a day (TID) | ORAL | Status: DC | PRN

## 2011-02-20 NOTE — Telephone Encounter (Signed)
Advised patient via telephone, Rx for Oxycodone ready for pick up will be left at front desk.

## 2011-02-20 NOTE — Telephone Encounter (Signed)
Pt needs written rx for Oxycodone 5 mg (pt said almost out of med). Also when Tramadol 50 mg was sent in on 01/09/11 it was for #120. Pt said she takes 2 Tramadol q4h and usually gets #240 for rx. Pt wants rx sent to [facility] for Tramadol #240. Pt also wants detailed info on diet for gout. Told pt to avoid red meat but pt said would like more info. Pt can be reached at [phone] when rx ready for pick up.

## 2011-02-20 NOTE — Telephone Encounter (Signed)
Rx for Tramadol sent electronically to [facility].  Rx for Oxycodone printed and put in Dr. Elmer Sow IN box for signature.

## 2011-02-20 NOTE — Telephone Encounter (Signed)
Ok to print rx for Oxycodone and I will sign. Ok to refill tramadol for 240. Please send info to her house about gout friendly diet.

## 2011-02-20 NOTE — Telephone Encounter (Signed)
Addended by: Gilmer Mor on: 02/20/2011 11:57 AM   Modules accepted: Orders

## 2011-02-21 ENCOUNTER — Other Ambulatory Visit: Payer: Self-pay | Admitting: Family Medicine

## 2011-02-21 DIAGNOSIS — E119 Type 2 diabetes mellitus without complications: Secondary | ICD-10-CM

## 2011-02-25 ENCOUNTER — Other Ambulatory Visit (INDEPENDENT_AMBULATORY_CARE_PROVIDER_SITE_OTHER): Payer: Medicare Other

## 2011-02-25 DIAGNOSIS — E119 Type 2 diabetes mellitus without complications: Secondary | ICD-10-CM

## 2011-02-25 LAB — HEMOGLOBIN A1C: Hgb A1c MFr Bld: 9 % — ABNORMAL HIGH (ref 4.6–6.5)

## 2011-02-26 ENCOUNTER — Telehealth: Payer: Self-pay | Admitting: *Deleted

## 2011-02-26 ENCOUNTER — Encounter: Payer: Self-pay | Admitting: Family Medicine

## 2011-02-26 ENCOUNTER — Ambulatory Visit (INDEPENDENT_AMBULATORY_CARE_PROVIDER_SITE_OTHER): Payer: Medicare Other | Admitting: Family Medicine

## 2011-02-26 VITALS — BP 122/70 | HR 86 | Temp 97.8°F | Ht 66.0 in | Wt 201.8 lb

## 2011-02-26 DIAGNOSIS — E119 Type 2 diabetes mellitus without complications: Secondary | ICD-10-CM

## 2011-02-26 MED ORDER — INSULIN GLARGINE 100 UNIT/ML ~~LOC~~ SOLN: 26.0000 [IU] | Freq: Every day | SUBCUTANEOUS | Status: DC

## 2011-02-26 MED ORDER — LIRAGLUTIDE 18 MG/3ML ~~LOC~~ SOLN: SUBCUTANEOUS | Status: DC

## 2011-02-26 NOTE — Telephone Encounter (Signed)
Pt is asking if she can change from colcrys to placquinil.  She says she was on that several years ago, at 300 mg's.  She is also asking for a print out of foods to avoid with gout.  [facility] [place].

## 2011-02-26 NOTE — Patient Instructions (Signed)
Let's cut back your lantus to 26 units nightly, Add Victoza. Please come back to see me in two weeks.   Please check your blood sugars three times daily for next several weeks and keep a log.

## 2011-02-26 NOTE — Telephone Encounter (Signed)
She would need to see a rheumatologist to take plaquenil. Would she like for me to refer?

## 2011-02-26 NOTE — Telephone Encounter (Signed)
Dr. Dayton Martes will discuss with patient at her appt today.

## 2011-02-26 NOTE — Progress Notes (Signed)
55 yo here to follow up several issues.    DM- does not check her CBGs regulary.  Currently on Glipize-Metformin 2.5/500 mg- 2 tabs two times a day, Lantus 48 units daily.  Denies any episodes of hypoglycemic symptoms. Says she is compliant with her medications but admits to being non compliant with diet. Lab Results  Component Value Date   HGBA1C 9.0* 02/25/2011   HGBA1C 8.0* 11/22/2010   HGBA1C 7.9* 07/23/2010   Could not tolerate Byetta due to GI side effects. Previously on Victoza and had good control of a1c.   Review of Systems       See HPI  The PMH, PSH, Social History, Family History, Medications, and allergies have been reviewed in [facility], and have been updated if relevant.  Physical Exam BP 122/70  Pulse 86  Temp(Src) 97.8 F (36.6 C) (Oral)  Ht 5\' 6"  (1.676 m)  Wt 201 lb 12 oz (91.513 kg)  BMI 32.56 kg/m2  General:  alert, well-developed, and well-nourished.   Head:  normocephalic and atraumatic.   Eyes:  vision grossly intact, pupils equal, pupils round, and pupils reactive to light.   Ears:  R ear normal and L ear normal.   Nose:  no external deformity.   Mouth:  Oral mucosa and oropharynx without lesions or exudates.  Teeth in good repair. Lungs:  Normal respiratory effort, chest expands symmetrically. Lungs are clear to auscultation, no crackles or wheezes. Heart:  Normal rate and regular rhythm. S1 and S2 normal without gallop, murmur, click, rub or other extra sounds. Abdomen:  Bowel sounds positive,abdomen soft and non-tender.  Some tightness of paraspinous muscles. Extremities:  no edema. Psych:  Cognition and judgment appear intact. Alert and cooperative with normal attention span and concentration. No apparent delusions, illusions, hallucinations  Assessment and Plan: 1. DIABETES MELLITUS, TYPE II   Deteriorated. >25 min spent with face to face with patient, >50% counseling and/or coordinating care. Will add Victoza and decrease current lantus dose to avoid  hypoglycemia. Advised that pt must check her CBGS regularly if we going to add another agent. The patient indicates understanding of these issues and agrees with the plan. Close follow up- if no improvement, needs referral to endocrinology.

## 2011-03-24 ENCOUNTER — Other Ambulatory Visit: Payer: Self-pay | Admitting: Internal Medicine

## 2011-03-24 MED ORDER — OXYCODONE HCL 5 MG PO CAPS: 5.0000 mg | ORAL_CAPSULE | ORAL | Status: DC | PRN

## 2011-03-25 NOTE — Telephone Encounter (Signed)
Patient advised via telephone Rx is ready for pick up will be left at front desk.

## 2011-03-27 ENCOUNTER — Other Ambulatory Visit: Payer: Self-pay | Admitting: *Deleted

## 2011-03-27 MED ORDER — LIRAGLUTIDE 18 MG/3ML ~~LOC~~ SOLN: SUBCUTANEOUS | Status: DC

## 2011-03-31 ENCOUNTER — Ambulatory Visit (INDEPENDENT_AMBULATORY_CARE_PROVIDER_SITE_OTHER): Payer: Medicare Other | Admitting: Family Medicine

## 2011-03-31 ENCOUNTER — Encounter: Payer: Self-pay | Admitting: Family Medicine

## 2011-03-31 VITALS — BP 130/84 | HR 85 | Temp 98.3°F | Ht 66.0 in | Wt 186.5 lb

## 2011-03-31 DIAGNOSIS — J069 Acute upper respiratory infection, unspecified: Secondary | ICD-10-CM | POA: Insufficient documentation

## 2011-03-31 DIAGNOSIS — E119 Type 2 diabetes mellitus without complications: Secondary | ICD-10-CM

## 2011-03-31 MED ORDER — AZITHROMYCIN 250 MG PO TABS: ORAL_TABLET | ORAL | Status: AC

## 2011-03-31 NOTE — Patient Instructions (Addendum)
Please change your lantus to 36 units nightly, continue your Victoza. Take antibiotic as directed.  Drink lots of fluids. Call if not improving as expected in 5-7 days.

## 2011-03-31 NOTE — Progress Notes (Signed)
55 yo here to follow up several issues.    DM-  Currently on Glipize-Metformin 2.5/500 mg- 2 tabs two times a day, Lantus 26, units daily and we added Victoza last month.  Denies any episodes of hypoglycemic symptoms. Says she has improved her diet. Lab Results  Component Value Date   HGBA1C 9.0* 02/25/2011   HGBA1C 8.0* 11/22/2010   HGBA1C 7.9* 07/23/2010   Bring CBGs in with her- improved but still not at goal 105-200 fasting.  Also has had cough, congestion, myalgias and sore throat for 2 weeks.  Not improving with conservative management- mucinex and other decongestants. No CP or SOB. No fevers.  Review of Systems       See HPI  The PMH, PSH, Social History, Family History, Medications, and allergies have been reviewed in [facility], and have been updated if relevant.  Physical Exam Ht 5\' 6"  (1.676 m) BP 130/84  Pulse 85  Temp(Src) 98.3 F (36.8 C) (Oral)  Ht 5\' 6"  (1.676 m)  Wt 186 lb 8 oz (84.596 kg)  BMI 30.10 kg/m2  SpO2 97%  General:  alert, well-developed, and well-nourished.   Head:  normocephalic and atraumatic.   Eyes:  vision grossly intact, pupils equal, pupils round, and pupils reactive to light.   Ears:  R ear normal and L ear normal.   Nose:  no external deformity.   Mouth:  Oral mucosa and oropharynx without lesions or exudates.  Teeth in good repair. Lungs:  Normal respiratory effort, chest expands symmetrically. Scattered bilateral exp wheezes Heart:  Normal rate and regular rhythm. S1 and S2 normal without gallop, murmur, click, rub or other extra sounds. Abdomen:  Bowel sounds positive,abdomen soft and non-tender.  Some tightness of paraspinous muscles. Extremities:  no edema. Psych:  Cognition and judgment appear intact. Alert and cooperative with normal attention span and concentration. No apparent delusions, illusions, hallucinations  Assessment and Plan: 1. DIABETES MELLITUS, TYPE II  Improved but still not at goal. Continue current meds but will increase  Lantus to 36 units qhs.  2. URI (upper respiratory infection)  Given duration and progression of symptoms, will treat for bacterial bronchitis with Zpack. The patient indicates understanding of these issues and agrees with the plan.

## 2011-04-21 ENCOUNTER — Other Ambulatory Visit: Payer: Self-pay | Admitting: Family Medicine

## 2011-04-24 ENCOUNTER — Other Ambulatory Visit: Payer: Self-pay | Admitting: Internal Medicine

## 2011-04-24 MED ORDER — OXYCODONE HCL 5 MG PO CAPS: 5.0000 mg | ORAL_CAPSULE | ORAL | Status: DC | PRN

## 2011-04-24 NOTE — Telephone Encounter (Signed)
Request for refill on Oxycodone 

## 2011-04-24 NOTE — Telephone Encounter (Signed)
Patient advised via telephone Rx ready for pick up will be left at front desk. 

## 2011-04-28 ENCOUNTER — Ambulatory Visit (INDEPENDENT_AMBULATORY_CARE_PROVIDER_SITE_OTHER): Payer: Medicare Other | Admitting: Family Medicine

## 2011-04-28 ENCOUNTER — Ambulatory Visit (INDEPENDENT_AMBULATORY_CARE_PROVIDER_SITE_OTHER)
Admission: RE | Admit: 2011-04-28 | Discharge: 2011-04-28 | Disposition: A | Discharge status: Home / Self Care | Payer: Medicare Other | Source: Ambulatory Visit | Attending: Family Medicine | Admitting: Family Medicine

## 2011-04-28 ENCOUNTER — Encounter: Payer: Self-pay | Admitting: Family Medicine

## 2011-04-28 VITALS — BP 152/80 | HR 71 | Temp 97.9°F | Wt 186.0 lb

## 2011-04-28 DIAGNOSIS — M25569 Pain in unspecified knee: Secondary | ICD-10-CM

## 2011-04-28 DIAGNOSIS — M25561 Pain in right knee: Secondary | ICD-10-CM

## 2011-04-28 MED ORDER — TRAMADOL HCL 50 MG PO TABS: ORAL_TABLET | ORAL | Status: DC

## 2011-04-28 MED ORDER — OXYCODONE HCL 5 MG PO TABS: 5.0000 mg | ORAL_TABLET | ORAL | Status: AC | PRN

## 2011-04-28 NOTE — Progress Notes (Signed)
SUBJECTIVE: Lori Phillips is a 56 y.o. female who sustained a right knee injury 3 day(s) ago. Mechanism of injury: unknown- [place]. Immediate symptoms: delayed swelling. Symptoms have been recurrent since that time. Prior history of related problems: no prior problems with this area in the past.  Patient Active Problem List  Diagnoses  . HYPOTHYROIDISM  . DIABETES MELLITUS, TYPE II  . HYPERLIPIDEMIA  . BIPOLAR DISORDER UNSPECIFIED  . ANXIETY  . Other chronic pain  . HYPERTENSION  . CORONARY ARTERY DISEASE  . LOW BACK PAIN  . DYSURIA  . NEPHROLITHIASIS, HX OF  . Petechiae  . URI (upper respiratory infection)  . Right knee pain   Past Medical History  Diagnosis Date  . CAD (coronary artery disease)     multiple caths  . Diabetes mellitus   . Hyperlipidemia   . Hypertension   . Thyroid disease     hypothyroid  . Low back pain   . Nephrolithiasis   . Fibromyalgia   . Bipolar disorder   . Anxiety    Past Surgical History  Procedure Date  . Vaginal hysterectomy   . Tonsillectomy    History  Substance Use Topics  . Smoking status: Never Smoker   . Smokeless tobacco: Not on file  . Alcohol Use: No   Family History  Problem Relation Age of Onset  . Hypertension Mother   . Bipolar disorder Father    Allergies  Allergen Reactions  . Quetiapine    Current Outpatient Prescriptions on File Prior to Visit  Medication Sig Dispense Refill  . BAYER MICROLET LANCETS lancets Use to check blood sugar as directed.  100 each  3  . BAYER MICROLET LANCETS lancets Use to check blood sugar as directed.  100 each  12  . Blood Glucose Monitoring Suppl (BAYER BREEZE 2 SYSTEM) W/DEVICE KIT Use as directed to check blood sugar       . colchicine (COLCRYS) 0.6 MG tablet Take 1 tablet (0.6 mg total) by mouth daily.  90 tablet  3  . diltiazem (TAZTIA XT) 180 MG 24 hr capsule Take 1 capsule (180 mg total) by mouth daily.  90 capsule  3  . DILTIAZEM HCL ER BEADS  PO Take 1 tablet by mouth at bedtime.        . divalproex (DEPAKOTE) 500 MG 24 hr tablet Take 1 tablet (500 mg total) by mouth 2 (two) times daily.  90 tablet  3  . DULoxetine (CYMBALTA) 60 MG capsule Take 1 capsule (60 mg total) by mouth daily.  90 capsule  3  . glipiZIDE-metformin (METAGLIP) 2.5-500 MG per tablet Take 2 tablets by mouth 2 (two) times daily before a meal.  120 tablet  3  . Glucose Blood (BAYER BREEZE 2 TEST) DISK Use to check blood sugar as directed       . insulin glargine (LANTUS SOLOSTAR) 100 UNIT/ML injection Inject 26 Units into the skin at bedtime. 46 units daily  4140 mL  12  . Insulin Pen Needle 31G X 5 MM MISC Use to check blood sugar as directed  100 each  12  . Iodoquinol-HC (VYTONE) 1 % CREA Apply thin layer to area bid   1 Tube  3  . levothyroxine (SYNTHROID, LEVOTHROID) 175 MCG tablet Take 175 mcg by mouth daily.        . Liraglutide 18 MG/3ML SOLN 0.6 mg once daily for 1 week; then increase to 1.2 mg once daily  6 mL  12  . lisinopril (PRINIVIL,ZESTRIL) 5 MG tablet Take 1 tablet (5 mg total) by mouth daily.  90 tablet  3  . Melatonin 5 MG TABS Take 1 tablet by mouth daily.        . Multiple Vitamin (MULTIVITAMIN) capsule Take 1 capsule by mouth daily.        . pantoprazole (PROTONIX) 40 MG tablet Take 1 tablet (40 mg total) by mouth daily.  90 tablet  3  . rosuvastatin (CRESTOR) 20 MG tablet Take 1 tablet (20 mg total) by mouth daily.  90 tablet  3   The PMH, PSH, Social History, Family History, Medications, and allergies have been reviewed in [facility], and have been updated if relevant.  OBJECTIVE: BP 152/80  Pulse 71  Temp(Src) 97.9 F (36.6 C) (Oral)  Wt 186 lb (84.369 kg)  Vital signs as noted above. Appearance: alert, well appearing, and in no distress. Knee exam: effusion, negative drawer sign, negative McMurray sign. X-ray: ordered, but results not yet available.  ASSESSMENT: Knee underlying chronic DJD likely  PLAN: rest the injured area as much  as practical See orders for this visit as documented in the electronic medical record.

## 2011-04-28 NOTE — Patient Instructions (Signed)
Good to see you. We will call you with the result of your xray.

## 2011-05-14 ENCOUNTER — Ambulatory Visit: Payer: Medicare Other | Admitting: Family Medicine

## 2011-05-22 ENCOUNTER — Other Ambulatory Visit: Payer: Self-pay | Admitting: Family Medicine

## 2011-05-22 NOTE — Telephone Encounter (Signed)
Medication refill  °Oxycodone  °

## 2011-05-23 ENCOUNTER — Telehealth: Payer: Self-pay | Admitting: Family Medicine

## 2011-05-23 MED ORDER — OXYCODONE HCL 5 MG PO TABS: 5.0000 mg | ORAL_TABLET | ORAL | Status: DC | PRN

## 2011-05-23 NOTE — Telephone Encounter (Signed)
Please enter official refill request.

## 2011-05-23 NOTE — Telephone Encounter (Signed)
Patient is calling to get a written rx for her oxycodone.  She said she's in a lot of pain and would like to be able to pick the rx up today.

## 2011-05-23 NOTE — Telephone Encounter (Signed)
I think we already approved this request right?

## 2011-05-23 NOTE — Telephone Encounter (Signed)
Addended by: Dianne Dun on: 05/23/2011 08:47 AM   Modules accepted: Orders

## 2011-05-23 NOTE — Telephone Encounter (Signed)
Rx refill request was entered on 05/22/2011 and it says print as the refill method but there is no Rx at my desk or in your IN box.  Looked through the stred bin and Rx is not there.  Please advise.

## 2011-05-23 NOTE — Telephone Encounter (Signed)
Addended by: Gilmer Mor on: 05/23/2011 08:45 AM   Modules accepted: Orders

## 2011-05-24 NOTE — Telephone Encounter (Signed)
Ok to print out another rx and place on my desk to sign.

## 2011-05-26 MED ORDER — OXYCODONE HCL 5 MG PO TABS: 5.0000 mg | ORAL_TABLET | ORAL | Status: DC | PRN

## 2011-05-26 NOTE — Telephone Encounter (Signed)
Rx printed and put in your IN box. 

## 2011-05-26 NOTE — Telephone Encounter (Signed)
Rx is ready for pick up and left at front desk.  Called patient on cell number and its disconnected, called home number it recording says that this mailbox is not recognized.  Will wait for patient to call back.

## 2011-06-19 ENCOUNTER — Other Ambulatory Visit: Payer: Self-pay | Admitting: Family Medicine

## 2011-06-19 MED ORDER — OXYCODONE HCL 5 MG PO TABS: 5.0000 mg | ORAL_TABLET | ORAL | Status: DC | PRN

## 2011-06-19 NOTE — Telephone Encounter (Signed)
Rx printed

## 2011-06-19 NOTE — Telephone Encounter (Signed)
Script placed upfront for pick up, pt advised

## 2011-06-19 NOTE — Telephone Encounter (Signed)
Pt needs refill on her Oxycodon and was wondering if she could get the RX tomorrow, she will be out in [place]

## 2011-06-29 ENCOUNTER — Other Ambulatory Visit: Payer: Self-pay | Admitting: Family Medicine

## 2011-07-23 ENCOUNTER — Other Ambulatory Visit: Payer: Self-pay

## 2011-07-23 MED ORDER — OXYCODONE HCL 5 MG PO TABS: 5.0000 mg | ORAL_TABLET | ORAL | Status: DC | PRN

## 2011-07-23 NOTE — Telephone Encounter (Signed)
Pt request refill Oxycodone 5 mg. Pt last seen 04/28/11. Please call pt at [phone] when rx ready for pick up.

## 2011-07-24 MED ORDER — OXYCODONE HCL 5 MG PO TABS: 5.0000 mg | ORAL_TABLET | ORAL | Status: DC | PRN

## 2011-07-24 NOTE — Telephone Encounter (Signed)
Left message on voice mail advising pt that script is ready for pick up.

## 2011-08-04 ENCOUNTER — Other Ambulatory Visit: Payer: Self-pay

## 2011-08-04 NOTE — Telephone Encounter (Signed)
Pt request written rx Oxycodone 5 mg. Pt last seen 04/28/11 and pt can be reached at [phone] when rx ready for pick up.

## 2011-08-05 MED ORDER — OXYCODONE HCL 5 MG PO TABS: 5.0000 mg | ORAL_TABLET | ORAL | Status: DC | PRN

## 2011-08-05 NOTE — Telephone Encounter (Signed)
Script placed up front for pick up, advised patient. 

## 2011-08-19 ENCOUNTER — Other Ambulatory Visit: Payer: Self-pay

## 2011-08-19 MED ORDER — OXYCODONE HCL 5 MG PO TABS: 5.0000 mg | ORAL_TABLET | ORAL | Status: DC | PRN

## 2011-08-19 NOTE — Telephone Encounter (Signed)
Pt request written rx for oxycodone 5 mg. Pt last seen 04/28/11. Last filled 08/04/11. When rx ready for pick up please call [phone].

## 2011-08-19 NOTE — Telephone Encounter (Signed)
Denied.  Too soon to refill.

## 2011-08-19 NOTE — Telephone Encounter (Signed)
Noted.  Ok to refill 90 tablets, no refills. In future, we will refill higher quantity.

## 2011-08-19 NOTE — Telephone Encounter (Signed)
Pt says she takes 5 to 6 tablets a day, so 90 tablets last her 15 days- she says she checked on her insurance EOB statement and that said it's a 15 day supply.

## 2011-08-19 NOTE — Telephone Encounter (Signed)
Script printed, on Dr. Elmer Sow desk for signature.

## 2011-08-20 NOTE — Telephone Encounter (Signed)
Script placed up front for pick up, advised patient. 

## 2011-08-21 ENCOUNTER — Other Ambulatory Visit: Payer: Self-pay | Admitting: Family Medicine

## 2011-08-22 ENCOUNTER — Telehealth: Payer: Self-pay | Admitting: Family Medicine

## 2011-08-22 MED ORDER — TRAMADOL HCL 50 MG PO TABS: ORAL_TABLET | ORAL | Status: DC

## 2011-08-22 NOTE — Telephone Encounter (Signed)
Medicine called to pharmacy.  They will cancel script sent in earlier today.

## 2011-08-22 NOTE — Telephone Encounter (Signed)
Caller: Princessa/Patient is calling with a question about Tramadol 50 MGS- SHE NORMALLY TAKES 2 TABS QID BUT WHEN STARTED TAKING THIS MED WAS ONLY TAKING 1 TAB. INCREASED TO 2 TABS 2 YEARS AGO. LAST RX CALLED IN WAS FOR ONE TAB AND SHE HAS RUN OUT AND CANNOT REFILL UNLESS NEW SCRIPT CALLED TO [facility] IN [place] [phone] .The medication was written by [name] Nestor Ramp). PLEASE CALL IN NEW SCRIPT ASAP. SHE IS NEEDING MED.

## 2011-08-22 NOTE — Telephone Encounter (Signed)
Ok to call in new rx as requested.

## 2011-08-25 ENCOUNTER — Encounter: Payer: Self-pay | Admitting: Internal Medicine

## 2011-08-25 ENCOUNTER — Encounter: Payer: Self-pay | Admitting: Family Medicine

## 2011-08-25 ENCOUNTER — Ambulatory Visit (INDEPENDENT_AMBULATORY_CARE_PROVIDER_SITE_OTHER): Payer: Medicare Other | Admitting: Family Medicine

## 2011-08-25 VITALS — BP 160/90 | HR 80 | Temp 97.9°F | Wt 188.0 lb

## 2011-08-25 DIAGNOSIS — R1110 Vomiting, unspecified: Secondary | ICD-10-CM

## 2011-08-25 DIAGNOSIS — N281 Cyst of kidney, acquired: Secondary | ICD-10-CM

## 2011-08-25 DIAGNOSIS — Q619 Cystic kidney disease, unspecified: Secondary | ICD-10-CM

## 2011-08-25 MED ORDER — PROMETHAZINE HCL 12.5 MG PO TABS: 12.5000 mg | ORAL_TABLET | Freq: Three times a day (TID) | ORAL | Status: DC | PRN

## 2011-08-25 NOTE — Progress Notes (Signed)
Subjective:    Patient ID: Lori Phillips, female    DOB: 1956-04-20, 56 y.o.   MRN: [id number]  HPI  Vomiting spells- Past four to five months- having episodes that last a few days of nausea and vomiting. No diarrhea.  No blood in stool but stools have been darker, "not black."  Now stools back to normal.  No fever. No abdominal pain.  Does not seem to be affected by food. Vomitus is biliary. Does not seem to be worse in morning or evening.  On protonix for reflux.  Has never had vomiting from reflux before.  Last colonoscopy was over 5 years ago- Dr. Leone Payor, 09/2005. HTN is up- feels it is related to pain.  Fibro and OA have been worse.  Patient Active Problem List  Diagnoses  . HYPOTHYROIDISM  . DIABETES MELLITUS, TYPE II  . HYPERLIPIDEMIA  . BIPOLAR DISORDER UNSPECIFIED  . ANXIETY  . Other chronic pain  . HYPERTENSION  . CORONARY ARTERY DISEASE  . LOW BACK PAIN  . DYSURIA  . NEPHROLITHIASIS, HX OF  . Petechiae  . URI (upper respiratory infection)  . Right knee pain  . Vomiting   Past Medical History  Diagnosis Date  . CAD (coronary artery disease)     multiple caths  . Diabetes mellitus   . Hyperlipidemia   . Hypertension   . Thyroid disease     hypothyroid  . Low back pain   . Nephrolithiasis   . Fibromyalgia   . Bipolar disorder   . Anxiety    Past Surgical History  Procedure Date  . Vaginal hysterectomy   . Tonsillectomy    History  Substance Use Topics  . Smoking status: Never Smoker   . Smokeless tobacco: Not on file  . Alcohol Use: No   Family History  Problem Relation Age of Onset  . Hypertension Mother   . Bipolar disorder Father    Allergies  Allergen Reactions  . Quetiapine    Current Outpatient Prescriptions on File Prior to Visit  Medication Sig Dispense Refill  . BAYER MICROLET LANCETS lancets Use to check blood sugar as directed.  100 each  3  . BAYER MICROLET LANCETS lancets Use to check blood sugar as directed.  100 each  12    . Blood Glucose Monitoring Suppl (BAYER BREEZE 2 SYSTEM) W/DEVICE KIT Use as directed to check blood sugar       . colchicine (COLCRYS) 0.6 MG tablet Take 1 tablet (0.6 mg total) by mouth daily.  90 tablet  3  . diltiazem (TAZTIA XT) 180 MG 24 hr capsule Take 1 capsule (180 mg total) by mouth daily.  90 capsule  3  . DILTIAZEM HCL ER BEADS PO Take 1 tablet by mouth at bedtime.        . divalproex (DEPAKOTE) 500 MG 24 hr tablet Take 1 tablet (500 mg total) by mouth 2 (two) times daily.  90 tablet  3  . DULoxetine (CYMBALTA) 60 MG capsule Take 1 capsule (60 mg total) by mouth daily.  90 capsule  3  . glipiZIDE-metformin (METAGLIP) 2.5-500 MG per tablet TAKE 2 TABLETS BY MOUTH TWICE DAILY BEFORE A MEAL  120 tablet  3  . Glucose Blood (BAYER BREEZE 2 TEST) DISK Use to check blood sugar as directed       . insulin glargine (LANTUS SOLOSTAR) 100 UNIT/ML injection Inject 26 Units into the skin at bedtime. 46 units daily  4140 mL  12  . Insulin Pen  Needle 31G X 5 MM MISC Use to check blood sugar as directed  100 each  12  . Iodoquinol-HC (VYTONE) 1 % CREA Apply thin layer to area bid   1 Tube  3  . levothyroxine (SYNTHROID, LEVOTHROID) 175 MCG tablet Take 175 mcg by mouth daily.        . Liraglutide 18 MG/3ML SOLN 0.6 mg once daily for 1 week; then increase to 1.2 mg once daily  6 mL  12  . lisinopril (PRINIVIL,ZESTRIL) 5 MG tablet Take 1 tablet (5 mg total) by mouth daily.  90 tablet  3  . Melatonin 5 MG TABS Take 1 tablet by mouth daily.        . Multiple Vitamin (MULTIVITAMIN) capsule Take 1 capsule by mouth daily.        Marland Kitchen oxyCODONE (OXY IR/ROXICODONE) 5 MG immediate release tablet Take 1 tablet (5 mg total) by mouth every 4 (four) hours as needed.  90 tablet  0  . pantoprazole (PROTONIX) 40 MG tablet Take 1 tablet (40 mg total) by mouth daily.  90 tablet  3  . rosuvastatin (CRESTOR) 20 MG tablet Take 1 tablet (20 mg total) by mouth daily.  90 tablet  3  . traMADol (ULTRAM) 50 MG tablet Take 2  tablets four times a day for pain.  240 tablet  0  . promethazine (PHENERGAN) 12.5 MG tablet Take 1 tablet (12.5 mg total) by mouth every 8 (eight) hours as needed for nausea.  20 tablet  0   The PMH, PSH, Social History, Family History, Medications, and allergies have been reviewed in [facility], and have been updated if relevant.    Review of Systems See HPI    Objective:   Physical Exam BP 160/90  Pulse 80  Temp(Src) 97.9 F (36.6 C) (Oral)  Wt 188 lb (85.276 kg)  General:  Well-developed,well-nourished,in no acute distress; alert,appropriate and cooperative throughout examination Head:  normocephalic and atraumatic.   Lungs:  Normal respiratory effort, chest expands symmetrically. Lungs are clear to auscultation, no crackles or wheezes. Heart:  Normal rate and regular rhythm. S1 and S2 normal without gallop, murmur, click, rub or other extra sounds. Abdomen:  Bowel sounds positive,mildly TTP over RUQ and epigastrium Msk:  No deformity or scoliosis noted of thoracic or lumbar spine.   Extremities:  No clubbing, cyanosis, edema, or deformity noted with normal full range of motion of all joints.   Neurologic:  alert & oriented X3 and gait normal.   Psych:  Cognition and judgment appear intact. Alert and cooperative with normal attention span and concentration. No apparent delusions, illusions, hallucinations        Assessment & Plan:   1. Vomiting  US Abdomen Complete, Ambulatory referral to Gastroenterology   New with wide differential dx including biliary colic, cyclical vomiting syndrome, hiatal hernia. Given that she is tender on exam, will order ultrasound of abdomen to evaluate biliary system. Will refer back to GI for further work up. Phenergan as needed. The patient indicates understanding of these issues and agrees with the plan.

## 2011-08-25 NOTE — Patient Instructions (Signed)
Please stop by to see Lori Phillips on your way out. 

## 2011-08-26 ENCOUNTER — Other Ambulatory Visit: Payer: Self-pay | Admitting: Family Medicine

## 2011-08-26 ENCOUNTER — Ambulatory Visit
Admission: RE | Admit: 2011-08-26 | Discharge: 2011-08-26 | Disposition: A | Discharge status: Home / Self Care | Payer: Medicare Other | Source: Ambulatory Visit | Attending: Family Medicine | Admitting: Family Medicine

## 2011-08-26 DIAGNOSIS — N281 Cyst of kidney, acquired: Secondary | ICD-10-CM

## 2011-08-26 DIAGNOSIS — R1110 Vomiting, unspecified: Secondary | ICD-10-CM

## 2011-08-27 ENCOUNTER — Other Ambulatory Visit (INDEPENDENT_AMBULATORY_CARE_PROVIDER_SITE_OTHER): Payer: Medicare Other

## 2011-08-27 ENCOUNTER — Other Ambulatory Visit: Payer: Self-pay | Admitting: Family Medicine

## 2011-08-27 DIAGNOSIS — R1110 Vomiting, unspecified: Secondary | ICD-10-CM

## 2011-08-27 NOTE — Progress Notes (Signed)
Addended by: Dianne Dun on: 08/27/2011 11:43 AM   Modules accepted: Orders

## 2011-08-29 ENCOUNTER — Ambulatory Visit (INDEPENDENT_AMBULATORY_CARE_PROVIDER_SITE_OTHER)
Admission: RE | Admit: 2011-08-29 | Discharge: 2011-08-29 | Disposition: A | Discharge status: Home / Self Care | Payer: Medicare Other | Source: Ambulatory Visit | Attending: Family Medicine | Admitting: Family Medicine

## 2011-08-29 DIAGNOSIS — N281 Cyst of kidney, acquired: Secondary | ICD-10-CM

## 2011-08-29 DIAGNOSIS — Q619 Cystic kidney disease, unspecified: Secondary | ICD-10-CM

## 2011-08-29 MED ORDER — IOHEXOL 350 MG/ML SOLN
100.0000 mL | Freq: Once | INTRAVENOUS | Status: AC | PRN
  Administered 2011-08-29: 100 mL via INTRAVENOUS

## 2011-09-01 ENCOUNTER — Other Ambulatory Visit: Payer: Self-pay | Admitting: Family Medicine

## 2011-09-01 DIAGNOSIS — N200 Calculus of kidney: Secondary | ICD-10-CM

## 2011-09-02 ENCOUNTER — Other Ambulatory Visit: Payer: Self-pay

## 2011-09-02 MED ORDER — OXYCODONE HCL 5 MG PO TABS: 5.0000 mg | ORAL_TABLET | ORAL | Status: DC | PRN

## 2011-09-02 NOTE — Telephone Encounter (Signed)
Pt request written rx Oxycodone 5 mg. Pt last seen 08/25/11. Pt will be by our office 09/03/11 to pick up CT disc and would like to pick up rx at that time. Pt can be reached [phone].

## 2011-09-02 NOTE — Telephone Encounter (Signed)
Advised pt script is ready for pick up, script placed up front. 

## 2011-09-10 ENCOUNTER — Ambulatory Visit (AMBULATORY_SURGERY_CENTER): Payer: Medicare Other | Admitting: *Deleted

## 2011-09-10 ENCOUNTER — Encounter: Payer: Self-pay | Admitting: Internal Medicine

## 2011-09-10 VITALS — Ht 66.0 in | Wt 187.1 lb

## 2011-09-10 DIAGNOSIS — Z1211 Encounter for screening for malignant neoplasm of colon: Secondary | ICD-10-CM

## 2011-09-10 MED ORDER — MOVIPREP 100 G PO SOLR: ORAL | Status: DC

## 2011-09-22 ENCOUNTER — Other Ambulatory Visit: Payer: Self-pay | Admitting: Family Medicine

## 2011-09-22 ENCOUNTER — Other Ambulatory Visit: Payer: Self-pay | Admitting: *Deleted

## 2011-09-22 MED ORDER — TRAMADOL HCL 50 MG PO TABS: ORAL_TABLET | ORAL | Status: DC

## 2011-09-22 NOTE — Telephone Encounter (Signed)
Medicine called to pharmacy. 

## 2011-09-22 NOTE — Telephone Encounter (Signed)
Faxed refill request from [facility] [place], last filled 240 on 08/22/11.

## 2011-09-24 ENCOUNTER — Ambulatory Visit: Payer: Self-pay | Admitting: Urology

## 2011-09-25 ENCOUNTER — Telehealth: Payer: Self-pay | Admitting: *Deleted

## 2011-09-25 NOTE — Telephone Encounter (Signed)
Per Dr. Dayton Martes, advised pharmacy ok to fill percocet prescribed by urologist.  Advised patient not to take oxycodone.  Pt states she was prescribed the percocet for the pain that she's having from her kidney stone.

## 2011-09-25 NOTE — Telephone Encounter (Signed)
Message copied by Eliezer Bottom on Thu Sep 25, 2011 10:10 AM ------      Message from: Alvina Chou      Created: Wed Sep 24, 2011  3:38 PM                   ----- Message -----         From: Thomasena Edis         Sent: 09/24/2011   3:23 PM           To: [facility] Can Pool            Caller: Naylee/Patient; Phone Number: [phone]; Message from caller: Patient states that [facility] in [place] needs callback to say you know that the Urologist Dr Orson Slick prescribed percocet 10/325 and to stop the 5 mg oxycodone.  Pharmacy is waiting for your office to acknowledge it.

## 2011-09-30 ENCOUNTER — Telehealth: Payer: Self-pay | Admitting: Family Medicine

## 2011-09-30 DIAGNOSIS — K3184 Gastroparesis: Secondary | ICD-10-CM

## 2011-09-30 NOTE — Telephone Encounter (Signed)
Caller: Nakeshia/Patient; PCP: [name] (Nestor Ramp); CB#: [phone]; Call regarding Vomiting; Onset "for a while now".  States she is out of Phenergan and would like Rx for Zofran;"My neighbor had Zofran and gave  me one which helped.  Flank pain rated 8-10 after med wears off.  See in 72 hours per Diabetes:  Gastrointestinal Problems protocol.  Caller declined appointment; states preference to get medication only.  Information noted and sent to provider for follow up .  [facility] in [place] is requested pharmacy; indicated in contact info.

## 2011-10-01 DIAGNOSIS — E1142 Type 2 diabetes mellitus with diabetic polyneuropathy: Secondary | ICD-10-CM

## 2011-10-01 DIAGNOSIS — E1143 Type 2 diabetes mellitus with diabetic autonomic (poly)neuropathy: Secondary | ICD-10-CM | POA: Insufficient documentation

## 2011-10-01 HISTORY — DX: Type 2 diabetes mellitus with diabetic polyneuropathy: E11.42

## 2011-10-01 MED ORDER — ONDANSETRON HCL 4 MG PO TABS: 4.0000 mg | ORAL_TABLET | Freq: Three times a day (TID) | ORAL | Status: DC | PRN

## 2011-10-01 NOTE — Telephone Encounter (Signed)
Advised patient

## 2011-10-01 NOTE — Telephone Encounter (Signed)
zofran sent to her pharmacy. Has known gastroparesis. Please keep [place] posted with symptoms.

## 2011-10-03 ENCOUNTER — Ambulatory Visit (AMBULATORY_SURGERY_CENTER): Payer: Medicare Other | Admitting: Internal Medicine

## 2011-10-03 ENCOUNTER — Encounter: Payer: Self-pay | Admitting: Internal Medicine

## 2011-10-03 VITALS — BP 124/68 | HR 65 | Temp 97.1°F | Resp 15 | Ht 66.0 in | Wt 187.0 lb

## 2011-10-03 DIAGNOSIS — Z860100 Personal history of colon polyps, unspecified: Secondary | ICD-10-CM

## 2011-10-03 DIAGNOSIS — D126 Benign neoplasm of colon, unspecified: Secondary | ICD-10-CM

## 2011-10-03 DIAGNOSIS — Z1211 Encounter for screening for malignant neoplasm of colon: Secondary | ICD-10-CM

## 2011-10-03 HISTORY — DX: Personal history of colon polyps, unspecified: Z86.0100

## 2011-10-03 HISTORY — DX: Personal history of colonic polyps: Z86.010

## 2011-10-03 LAB — GLUCOSE, CAPILLARY
Glucose-Capillary: 118 mg/dL — ABNORMAL HIGH (ref 70–99)
Glucose-Capillary: 102 mg/dL — ABNORMAL HIGH (ref 70–99)

## 2011-10-03 MED ORDER — SODIUM CHLORIDE 0.9 % IV SOLN: 500.0000 mL | INTRAVENOUS | Status: DC

## 2011-10-03 NOTE — Progress Notes (Signed)
Patient did not have preoperative order for IV antibiotic SSI prophylaxis. (G8918)Patient did not have preoperative order for IV antibiotic SSI prophylaxis. (G8918)Patient did not experience any of the following events: a burn prior to discharge; a fall within the facility; wrong site/side/patient/procedure/implant event; or a hospital transfer or hospital admission upon discharge from the facility. (G8907) 

## 2011-10-03 NOTE — Progress Notes (Signed)
10:10 Andrey Farmer, CRNA  [place] 2nd bag of normal saline 0.9% 500 ml. Maw

## 2011-10-03 NOTE — Patient Instructions (Addendum)

## 2011-10-03 NOTE — Op Note (Signed)
[facility] [address] [place], [place]  [address]  COLONOSCOPY PROCEDURE REPORT  PATIENT:  Lori Phillips, Lori Phillips  MR#:  [id number] BIRTHDATE:  15-Aug-1955, 56 yrs. old  GENDER:  female ENDOSCOPIST:  Iva Boop, MD, [facility]  PROCEDURE DATE:  10/03/2011 PROCEDURE:  Colonoscopy with snare polypectomy ASA CLASS:  Class III INDICATIONS:  surveillance and high-risk screening, history of polyps 2005 3 polyps 2007 no polyps MEDICATIONS:   These medications were titrated to patient response per physician's verbal order, MAC sedation, administered by CRNA, propofol (Diprivan) 400 mg IV  DESCRIPTION OF PROCEDURE:   After the risks benefits and alternatives of the procedure were thoroughly explained, informed consent was obtained.  Digital rectal exam was performed and revealed no abnormalities.   The LB [id number] endoscope was introduced through the anus and advanced to the cecum, which was identified by both the appendix and ileocecal valve, without limitations.  The quality of the prep was adequate, using MoviPrep.  The instrument was then slowly withdrawn as the colon was fully examined. <<PROCEDUREIMAGES>>  FINDINGS:  Five polyps were found. All diminutive, max 5 mm. transverse (1) and descending (4). all removed cold snare. 4/5 recovered for pathology.  This was otherwise a normal examination of the colon. The hepatic flexure was angulated and difficult to pass, however.   Retroflexed views in the rectum revealed no abnormalities.    The time to cecum = 16:21 minutes. The scope was then withdrawn in 18:21 minutes from the cecum and the procedure completed. COMPLICATIONS:  None ENDOSCOPIC IMPRESSION: 1) Five diminutive polyps removed 2) Otherwise normal examination 3) Personal history of polyps 2005  REPEAT EXAM:  In for Colonoscopy, pending biopsy results.  Iva Boop, MD, [facility]  CC:  [name] MD and The Patient  n. eSIGNED:   Iva Boop at  10/03/2011 10:25 AM  Chaya Jan, [phone]

## 2011-10-03 NOTE — Progress Notes (Signed)
The pt tolerated the colonoscopy very well. Maw   

## 2011-10-06 ENCOUNTER — Telehealth: Payer: Self-pay

## 2011-10-06 ENCOUNTER — Other Ambulatory Visit: Payer: Self-pay | Admitting: *Deleted

## 2011-10-06 ENCOUNTER — Other Ambulatory Visit (INDEPENDENT_AMBULATORY_CARE_PROVIDER_SITE_OTHER): Payer: Medicare Other

## 2011-10-06 ENCOUNTER — Ambulatory Visit (INDEPENDENT_AMBULATORY_CARE_PROVIDER_SITE_OTHER): Payer: Medicare Other | Admitting: Physician Assistant

## 2011-10-06 ENCOUNTER — Ambulatory Visit (INDEPENDENT_AMBULATORY_CARE_PROVIDER_SITE_OTHER)
Admission: RE | Admit: 2011-10-06 | Discharge: 2011-10-06 | Disposition: A | Discharge status: Home / Self Care | Payer: Medicare Other | Source: Ambulatory Visit | Attending: Physician Assistant | Admitting: Physician Assistant

## 2011-10-06 ENCOUNTER — Encounter: Payer: Self-pay | Admitting: Physician Assistant

## 2011-10-06 VITALS — BP 152/76 | HR 76 | Ht 66.0 in | Wt 185.6 lb

## 2011-10-06 DIAGNOSIS — Z86010 Personal history of colonic polyps: Secondary | ICD-10-CM

## 2011-10-06 DIAGNOSIS — R109 Unspecified abdominal pain: Secondary | ICD-10-CM

## 2011-10-06 LAB — CBC WITH DIFFERENTIAL/PLATELET
Basophils Absolute: 0 10*3/uL (ref 0.0–0.1)
Eosinophils Absolute: 0.2 10*3/uL (ref 0.0–0.7)
HCT: 39 % (ref 36.0–46.0)
Lymphs Abs: 4.1 10*3/uL — ABNORMAL HIGH (ref 0.7–4.0)
MCHC: 33.7 g/dL (ref 30.0–36.0)
MCV: 90.8 fl (ref 78.0–100.0)
Monocytes Absolute: 0.6 10*3/uL (ref 0.1–1.0)
Platelets: 275 10*3/uL (ref 150.0–400.0)
RDW: 13 % (ref 11.5–14.6)

## 2011-10-06 LAB — BASIC METABOLIC PANEL
Calcium: 9.4 mg/dL (ref 8.4–10.5)
GFR: 54.73 mL/min — ABNORMAL LOW (ref >60.00)
Sodium: 140 mEq/L (ref 135–145)

## 2011-10-06 MED ORDER — IOHEXOL 300 MG/ML  SOLN
100.0000 mL | Freq: Once | INTRAMUSCULAR | Status: AC | PRN
Start: 2011-10-06 — End: 2011-10-06
  Administered 2011-10-06: 100 mL via INTRAVENOUS

## 2011-10-06 NOTE — Telephone Encounter (Signed)
Patient will come in this am at 11:00 and see Mike Gip PA

## 2011-10-06 NOTE — Telephone Encounter (Signed)
Dr. Leone Payor advice noted.

## 2011-10-06 NOTE — Progress Notes (Signed)
Reviewed and agree with management plans. I discussed the plans with Mike Gip, PA Judie Petit T. Russella Dar MD [facility]

## 2011-10-06 NOTE — Patient Instructions (Addendum)
Please go to the basement level to have your labs drawn.   You have been scheduled for a CT scan of the abdomen and pelvis at [facility] ([address]---this is in the same building as Architectural technologist).   You are scheduled today at 1;30 PM. You should arrive at 1:15 PM prior to your appointment time for registration. Please follow the written instructions below on the day of your exam:  WARNING: IF YOU ARE ALLERGIC TO IODINE/X-RAY DYE, PLEASE NOTIFY RADIOLOGY IMMEDIATELY AT [phone]! YOU WILL BE GIVEN A 13 HOUR PREMEDICATION PREP.  1) Do not eat or drink anything after 11;30 AM  (4 hours prior to your test) 2) You have been given 2 bottles of oral contrast to drink. The solution may taste  better if refrigerated, but do NOT add ice or any other liquid to this solution. Shake  well before drinking.    Drink 1 bottle of contrast @11 :30 Am  (2 hours prior to your exam)  Drink 1 bottle of contrast @12 :30 PM  (1 hour prior to your exam)  You may take any medications as prescribed with a small amount of water except for the following: Metformin, Glucophage, Glucovance, Avandamet, Riomet, Fortamet, Actoplus Met, Janumet, Glumetza or Metaglip. The above medications must be held the day of the exam AND 48 hours after the exam.  The purpose of you drinking the oral contrast is to aid in the visualization of your intestinal tract. The contrast solution may cause some diarrhea. Before your exam is started, you will be given a small amount of fluid to drink. Depending on your individual set of symptoms, you may also receive an intravenous injection of x-ray contrast/dye. Plan on being at [facility] for 30 minutes or long, depending on the type of exam you are having performed.  If you have any questions regarding your exam or if you need to reschedule, you may call the CT department at [phone] between the hours of 8:00 am and 5:00 pm,  Monday-Friday.  ________________________________________________________________________

## 2011-10-06 NOTE — Telephone Encounter (Signed)
I am out of the office until weds. Please print out and place in my box for signature and she can pick it up on Weds.

## 2011-10-06 NOTE — Telephone Encounter (Signed)
  Follow up Call-  Call back number 10/03/2011  Post procedure Call Back phone  # [phone]  Permission to leave phone message Yes     Patient questions:  Do you have a fever, pain , or abdominal swelling? yes Pain Score  10 *  Have you tolerated food without any problems? yes  Have you been able to return to your normal activities? yes  Do you have any questions about your discharge instructions: Diet   no Medications  no Follow up visit  no  Do you have questions or concerns about your Care? no  Actions: * If pain score is 4 or above: No action needed, pain <4.  Patient states having pain off and on sense Friday. Pain scale of 10 when occurs. Pain hit all of a sudden like a knife sticking in her lower abdomin. Too many times to count when it happens. No fewer,able to eat okay. Pain happens also when not eating. Pain lasts 15-20 minutes. Patient takes  Percocet any way and does not ease the pain. Not hurting now. Last episode early this morning. No blood noted.

## 2011-10-06 NOTE — Telephone Encounter (Signed)
I called - LLQ intermittent pain for 10-20 mins at a time. Not like her previous cramping. Has defecated multiple times.  We will call her back about getting evaluated in the office.

## 2011-10-06 NOTE — Telephone Encounter (Signed)
OK to refill? Patient requests today if possible. She doesn't have much gas ad is in town today and would like to come by the office while she is in town to pick it up. Please call when ready.

## 2011-10-06 NOTE — Progress Notes (Signed)
Subjective:    Patient ID: Lori Phillips, female    DOB: [birth year], 56 y.o.   MRN: [id number]  HPI Lori Phillips is a 57 year old female known to Dr. Leone Payor him and 2 has positive family history of colon cancer and has undergone several screening colonoscopies. She underwent colonoscopy on 10/03/2011 with Dr. Leone Payor and was found to have 5 diminutive polyps all of which were removed by snare without cautery. For these polyps were in the descending colon. She will is not known to have diverticulosis. Patient says she felt fine when she went home after the procedure on Friday however when she woke up the following morning 10/04/2011 she felt bloated distended uncomfortable and then started having what she describes as stabbing left lower quadrant pains which have been intermittent ever since. She says she'll have sharp pains for about 15 minutes release off and then come back again. She has been having some loose bowel movements which is normal for her- she has not noted any melena or hematochezia. She has not had any fever or chills. She has had some vague nausea. She is known to have a kidney stone on the right but recent CT scan done about a month ago did not show any evidence of ureterolithiasis or nephrolithiasis on the left .She has no current dysuria urgency frequency etc. He is status post bilateral tubal ligation ,and has had a vaginal hysterectomy.    Review of Systems  Constitutional: Negative.   HENT: Negative.   Eyes: Negative.   Respiratory: Negative.   Cardiovascular: Negative.   Gastrointestinal: Positive for abdominal pain.  Genitourinary: Negative.   Musculoskeletal: Positive for back pain.  Skin: Negative.   Neurological: Negative.   Hematological: Negative.   Psychiatric/Behavioral: Negative.    Outpatient Prescriptions Prior to Visit  Medication Sig Dispense Refill  . BAYER MICROLET LANCETS lancets Use to check blood sugar as directed.  100 each  12  . Blood Glucose Monitoring  Suppl (BAYER BREEZE 2 SYSTEM) W/DEVICE KIT Use as directed to check blood sugar       . diltiazem (TAZTIA XT) 180 MG 24 hr capsule Take 1 capsule (180 mg total) by mouth daily.  90 capsule  3  . divalproex (DEPAKOTE) 500 MG 24 hr tablet Take 1 tablet (500 mg total) by mouth 2 (two) times daily.  90 tablet  3  . DULoxetine (CYMBALTA) 60 MG capsule Take 1 capsule (60 mg total) by mouth daily.  90 capsule  3  . glipiZIDE-metformin (METAGLIP) 2.5-500 MG per tablet TAKE 2 TABLETS BY MOUTH TWICE DAILY BEFORE A MEAL  120 tablet  6  . Glucose Blood (BAYER BREEZE 2 TEST) DISK Use to check blood sugar as directed       . insulin glargine (LANTUS SOLOSTAR) 100 UNIT/ML injection Inject 26 Units into the skin at bedtime. 46 units daily  4140 mL  12  . Insulin Pen Needle 31G X 5 MM MISC Use to check blood sugar as directed  100 each  12  . Iodoquinol-HC (VYTONE) 1 % CREA Apply thin layer to area bid   1 Tube  3  . levothyroxine (SYNTHROID, LEVOTHROID) 175 MCG tablet Take 175 mcg by mouth daily.        . Liraglutide 18 MG/3ML SOLN 0.6 mg once daily for 1 week; then increase to 1.2 mg once daily  6 mL  12  . lisinopril (PRINIVIL,ZESTRIL) 5 MG tablet Take 1 tablet (5 mg total) by mouth daily.  90 tablet  3  .  Melatonin 5 MG TABS Take 1 tablet by mouth daily.        . Multiple Vitamin (MULTIVITAMIN) capsule Take 1 capsule by mouth daily.        . Omega-3 Fatty Acids (FISH OIL) 1000 MG CAPS Take one by mouth daily      . ondansetron (ZOFRAN) 4 MG tablet Take 1 tablet (4 mg total) by mouth every 8 (eight) hours as needed for nausea.  20 tablet  0  . oxyCODONE (OXY IR/ROXICODONE) 5 MG immediate release tablet Take 1 tablet (5 mg total) by mouth every 4 (four) hours as needed.  180 tablet  0  . pantoprazole (PROTONIX) 40 MG tablet Take 1 tablet (40 mg total) by mouth daily.  90 tablet  3  . rosuvastatin (CRESTOR) 20 MG tablet Take 1 tablet (20 mg total) by mouth daily.  90 tablet  3  . traMADol (ULTRAM) 50 MG tablet  Take 2 tablets four times a day for pain.  240 tablet  0  . aspirin 81 MG tablet Take 81 mg by mouth daily.      . colchicine (COLCRYS) 0.6 MG tablet Take 1 tablet (0.6 mg total) by mouth daily.  90 tablet  3  . promethazine (PHENERGAN) 12.5 MG tablet Take 1 tablet (12.5 mg total) by mouth every 8 (eight) hours as needed for nausea.  20 tablet  0  . BAYER MICROLET LANCETS lancets Use to check blood sugar as directed.  100 each  3  . DILTIAZEM HCL ER BEADS PO Take 1 tablet by mouth at bedtime.         Facility-Administered Medications Prior to Visit  Medication Dose Route Frequency Provider Last Rate Last Dose  . 0.9 %  sodium chloride infusion  500 mL Intravenous Continuous Iva Boop, MD       Allergies  Allergen Reactions  . Byetta 10 Mcg Pen (Exenatide) Nausea And Vomiting  . Quetiapine        Patient Active Problem List  Diagnosis  . HYPOTHYROIDISM  . DIABETES MELLITUS, TYPE II  . HYPERLIPIDEMIA  . BIPOLAR DISORDER UNSPECIFIED  . ANXIETY  . Other chronic pain  . HYPERTENSION  . CORONARY ARTERY DISEASE  . LOW BACK PAIN  . NEPHROLITHIASIS, HX OF  . Petechiae  . Vomiting  . Gastroparesis due to DM  . Personal history of colonic polyps   History   Social History  . Marital Status: Divorced    Spouse Name: N/A    Number of Children: 3  . Years of Education: N/A   Occupational History  . disability    .     Social History Main Topics  . Smoking status: Never Smoker   . Smokeless tobacco: Never Used  . Alcohol Use: No  . Drug Use: No  . Sexually Active: Not on file   Other Topics Concern  . Not on file   Social History Narrative  . No narrative on file    Objective:   Physical Exam well-developed white female in no acute distress, blood pressure 152/76, pulse 76 height 5 foot 6 weight 185. HEENT; nontraumatic normocephalic EOMI PERRLA sclera anicteric, Supple no JVD, Cardiovascular; regular rate and rhythm with S1-S2 no murmur or gallop, Pulmonary; clear  bilaterally, Abdomen; soft bowel sounds are present she is rather diffusely tender but more markedly tender in the left mid quadrant and left lower quadrant with some guarding no true rebound, Rectal; exam not done, Extremities; no clubbing cyanosis or  edema skin warm dry, Psych; mood and affect normal and appropriate        Assessment & Plan:  #37 56 year old female status post colonoscopy and polypectomy on 10/03/2011 now presenting with 3 day history of persistent stabbing left-sided abdominal pain which was preceded by bloating and distention which has since resolved. Patient did have polypectomy x4 in the descending colon but no cautery was used. Will need to rule out perforation, microperforation or other inflammatory process. Plan; scheduled for CT scan of the abdomen and pelvis with contrast today, this is to be a call report. CBC with differential and Bmet.  Further plans pending results of CT scan, patient advised to stay on clear liquids in the interim.

## 2011-10-07 MED ORDER — OXYCODONE HCL 5 MG PO TABS: 5.0000 mg | ORAL_TABLET | ORAL | Status: DC | PRN

## 2011-10-07 NOTE — Telephone Encounter (Signed)
On your desk for signature

## 2011-10-08 ENCOUNTER — Encounter: Payer: Self-pay | Admitting: Internal Medicine

## 2011-10-08 ENCOUNTER — Telehealth: Payer: Self-pay

## 2011-10-08 NOTE — Telephone Encounter (Signed)
Advised pharmacist chronic pain and back pain.

## 2011-10-08 NOTE — Telephone Encounter (Signed)
Signed in my box

## 2011-10-08 NOTE — Progress Notes (Signed)
Quick Note:  3-4 diminutive adenomas Routine repeat colonoscopy about 09/2014 ______

## 2011-10-08 NOTE — Telephone Encounter (Signed)
Advised pt script is ready for pick up, script placed at front desk.

## 2011-10-08 NOTE — Telephone Encounter (Signed)
That's correct. Thank you.

## 2011-10-08 NOTE — Telephone Encounter (Signed)
Lori Phillips with [facility] needs diagnosis code to go with Oxycodone rx. Please advise.

## 2011-10-22 ENCOUNTER — Other Ambulatory Visit: Payer: Self-pay | Admitting: Family Medicine

## 2011-11-03 ENCOUNTER — Other Ambulatory Visit: Payer: Self-pay

## 2011-11-03 MED ORDER — OXYCODONE HCL 5 MG PO TABS: 5.0000 mg | ORAL_TABLET | ORAL | Status: DC | PRN

## 2011-11-03 NOTE — Telephone Encounter (Signed)
Pt request rx oxycodone.call when ready for pick up.(pt would like to pick up 11/04/11.)

## 2011-11-04 ENCOUNTER — Ambulatory Visit: Payer: Self-pay | Admitting: Urology

## 2011-11-04 NOTE — Telephone Encounter (Signed)
Advised patient script is ready for pick up. 

## 2011-11-21 ENCOUNTER — Other Ambulatory Visit: Payer: Self-pay | Admitting: *Deleted

## 2011-11-21 MED ORDER — DILTIAZEM HCL ER BEADS 180 MG PO CP24: 180.0000 mg | ORAL_CAPSULE | Freq: Every day | ORAL | Status: DC

## 2011-11-21 MED ORDER — TRAMADOL HCL 50 MG PO TABS: ORAL_TABLET | ORAL | Status: DC

## 2011-11-21 NOTE — Telephone Encounter (Signed)
Faxed refill request from [facility] [place].  Last filled 09/22/11.

## 2011-11-21 NOTE — Telephone Encounter (Signed)
Medicine called to pharmacy. 

## 2011-11-24 ENCOUNTER — Ambulatory Visit: Payer: Self-pay | Admitting: Urology

## 2011-11-24 ENCOUNTER — Ambulatory Visit (INDEPENDENT_AMBULATORY_CARE_PROVIDER_SITE_OTHER): Payer: Medicare Other | Admitting: Family Medicine

## 2011-11-24 ENCOUNTER — Telehealth: Payer: Self-pay | Admitting: *Deleted

## 2011-11-24 VITALS — BP 130/80 | HR 84 | Temp 98.1°F | Wt 182.0 lb

## 2011-11-24 DIAGNOSIS — G8929 Other chronic pain: Secondary | ICD-10-CM

## 2011-11-24 DIAGNOSIS — F319 Bipolar disorder, unspecified: Secondary | ICD-10-CM

## 2011-11-24 DIAGNOSIS — M5136 Other intervertebral disc degeneration, lumbar region: Secondary | ICD-10-CM

## 2011-11-24 DIAGNOSIS — E1143 Type 2 diabetes mellitus with diabetic autonomic (poly)neuropathy: Secondary | ICD-10-CM

## 2011-11-24 DIAGNOSIS — E119 Type 2 diabetes mellitus without complications: Secondary | ICD-10-CM

## 2011-11-24 DIAGNOSIS — M5137 Other intervertebral disc degeneration, lumbosacral region: Secondary | ICD-10-CM

## 2011-11-24 MED ORDER — OXYCODONE HCL 10 MG PO TABS: ORAL_TABLET | ORAL | Status: DC

## 2011-11-24 NOTE — Patient Instructions (Addendum)
Good to see you. Please stop by to see Lori Phillips on your way out to set up you psychiatry appointment. Please come see me in a couple of months, we can check your blood work at that time.

## 2011-11-24 NOTE — Telephone Encounter (Signed)
Message copied by Eliezer Bottom on Mon Nov 24, 2011  2:52 PM ------      Message from: Dianne Dun      Created: Mon Nov 24, 2011  2:15 PM       Thank you.      ----- Message -----         From: Eliezer Bottom, CMA         Sent: 11/24/2011   2:00 PM           To: Dianne Dun, MD            I spoke to pharmacist at [facility] in [place], they are filling her meds, getting her oxycodone filled there today.  He says that he has heard of other pt's not being found in [facility] database also, says he's not sure why that happens.      ----- Message -----         From: Dianne Dun, MD         Sent: 11/24/2011   1:07 PM           To: Eliezer Bottom, CMA            Please call pt's pharmacy to find out if they are filling her narcotics- I cannot find her in controlled substances data base.

## 2011-11-24 NOTE — Progress Notes (Signed)
56 yo here to discuss several issues.    DM- does not check her CBGs regulary.  Currently on Glipize-Metformin 2.5/500 mg- 2 tabs two times a day, Lantus 36 units daily.  Denies any episodes of hypoglycemic symptoms.  Recent a1cs show poor control. Lab Results  Component Value Date   HGBA1C 9.0* 02/25/2011   She does not want to recheck a1c today- reports being out of lantus for several weeks but now has restarted all of her medication.  Chronic low back pain- long h/o chronic low back pain. Xray in 08/2010 showed DDD at L5-S1. Was being followed by pain clinic but stopped going in 2010 due to cost. Has not been abusing pain contract so I have been managing her oxycodone.  Recently, feels like she needs more than 5 mg three times daily. Denies any radiculopathy. Saw Dr. Shon Baton at [facility] ortho in 08/2010- recommended PT but patient cannot afford this.  Bipolar disorder-  Previously followed by psychiatry, Dr. Janeece Riggers. Could not tolerate Seroquel. Had been stable on Depakote.  Last few months, feels like she is getting worse- staying up late, feels manic. More anxiety. No SI or HI.    Review of Systems       See HPI  Patient Active Problem List  Diagnosis  . HYPOTHYROIDISM  . DIABETES MELLITUS, TYPE II  . HYPERLIPIDEMIA  . BIPOLAR DISORDER UNSPECIFIED  . ANXIETY  . Other chronic pain  . HYPERTENSION  . CORONARY ARTERY DISEASE  . LOW BACK PAIN  . NEPHROLITHIASIS, HX OF  . Petechiae  . Vomiting  . Gastroparesis due to DM  . Personal history of colonic polyps  . DDD (degenerative disc disease), lumbar   Past Medical History  Diagnosis Date  . CAD (coronary artery disease)     multiple caths  . Diabetes mellitus   . Hyperlipidemia   . Hypertension   . Thyroid disease     hypothyroid  . Low back pain   . Nephrolithiasis   . Fibromyalgia   . Bipolar disorder   . Anxiety    Past Surgical History  Procedure Date  . Vaginal hysterectomy   . Tonsillectomy   . Colonoscopy  multiple    2005 [place] records not available-polyps x 3   History  Substance Use Topics  . Smoking status: Never Smoker   . Smokeless tobacco: Never Used  . Alcohol Use: No   Family History  Problem Relation Age of Onset  . Hypertension Mother   . Diabetes Mother   . Bipolar disorder Father   . Colon cancer Paternal Aunt   . Colon cancer Paternal Grandmother    Allergies  Allergen Reactions  . Byetta 10 Mcg Pen (Exenatide) Nausea And Vomiting  . Quetiapine    Current Outpatient Prescriptions on File Prior to Visit  Medication Sig Dispense Refill  . aspirin 81 MG tablet Take 81 mg by mouth daily.      Marland Kitchen BAYER MICROLET LANCETS lancets Use to check blood sugar as directed.  100 each  12  . Blood Glucose Monitoring Suppl (BAYER BREEZE 2 SYSTEM) W/DEVICE KIT Use as directed to check blood sugar       . colchicine (COLCRYS) 0.6 MG tablet Take 1 tablet (0.6 mg total) by mouth daily.  90 tablet  3  . diltiazem (TAZTIA XT) 180 MG 24 hr capsule Take 1 capsule (180 mg total) by mouth daily.  90 capsule  1  . divalproex (DEPAKOTE) 500 MG 24 hr tablet Take  1 tablet (500 mg total) by mouth 2 (two) times daily.  90 tablet  3  . DULoxetine (CYMBALTA) 60 MG capsule Take 1 capsule (60 mg total) by mouth daily.  90 capsule  3  . glipiZIDE-metformin (METAGLIP) 2.5-500 MG per tablet TAKE 2 TABLETS BY MOUTH TWICE DAILY BEFORE A MEAL  120 tablet  6  . Glucose Blood (BAYER BREEZE 2 TEST) DISK Use to check blood sugar as directed       . insulin glargine (LANTUS SOLOSTAR) 100 UNIT/ML injection Inject 26 Units into the skin at bedtime. 46 units daily  4140 mL  12  . Insulin Pen Needle 31G X 5 MM MISC Use to check blood sugar as directed  100 each  12  . Iodoquinol-HC (VYTONE) 1 % CREA Apply thin layer to area bid   1 Tube  3  . levothyroxine (SYNTHROID, LEVOTHROID) 175 MCG tablet Take 175 mcg by mouth daily.        . Liraglutide 18 MG/3ML SOLN 0.6 mg once daily for 1 week; then increase to 1.2 mg  once daily  6 mL  12  . lisinopril (PRINIVIL,ZESTRIL) 5 MG tablet Take 1 tablet (5 mg total) by mouth daily.  90 tablet  3  . Multiple Vitamin (MULTIVITAMIN) capsule Take 1 capsule by mouth daily.        . Omega-3 Fatty Acids (FISH OIL) 1000 MG CAPS Take one by mouth daily      . ondansetron (ZOFRAN) 4 MG tablet TAKE ONE TABLET BY MOUTH EVERY 8 HOURS AS NEEDED FOR NAUSEA  20 tablet  0  . pantoprazole (PROTONIX) 40 MG tablet Take 1 tablet (40 mg total) by mouth daily.  90 tablet  3  . rosuvastatin (CRESTOR) 20 MG tablet Take 1 tablet (20 mg total) by mouth daily.  90 tablet  3  . traMADol (ULTRAM) 50 MG tablet Take 2 tablets four times a day for pain.  240 tablet  0  . DISCONTD: promethazine (PHENERGAN) 12.5 MG tablet Take 1 tablet (12.5 mg total) by mouth every 8 (eight) hours as needed for nausea.  20 tablet  0    The PMH, PSH, Social History, Family History, Medications, and allergies have been reviewed in [facility], and have been updated if relevant.  Physical Exam BP 130/80  Pulse 84  Temp 98.1 F (36.7 C)  Wt 182 lb (82.555 kg)  General:  alert, well-developed, and well-nourished.   Head:  normocephalic and atraumatic.   Nose:  no external deformity.   Mouth:  Oral mucosa and oropharynx without lesions or exudates.  Teeth in good repair. Lungs:  Normal respiratory effort, chest expands symmetrically. Lungs are clear to auscultation, no crackles or wheezes. Heart:  Normal rate and regular rhythm. S1 and S2 normal without gallop, murmur, click, rub or other extra sounds. Abdomen:  Bowel sounds positive,abdomen soft and non-tender.  Some tightness of paraspinous muscles. Psych:  Cognition and judgment appear intact. Alert and cooperative with normal attention span and concentration. No apparent delusions, illusions, hallucinations  Assessment and Plan: 1. BIPOLAR DISORDER UNSPECIFIED  Deteriorated. Denies being a threat to herself or anyone else.  Overall states she feels ok, but can  recognize early signs that bipolar is not well controlled.  Refer to psychiatry. The patient indicates understanding of these issues and agrees with the plan.  Ambulatory referral to Psychiatry  2. DDD (degenerative disc disease), lumbar  Deteriorated.  Will increase oxycodone today but told her I cannot provide further  refills if she is not willing to go to PT or consider other therapies such as epidural injections. The patient indicates understanding of these issues and agrees with the plan.    3. DIABETES MELLITUS, TYPE II  Make follow up appt in 2 months to recheck labs as she refuses them today.

## 2011-12-04 ENCOUNTER — Ambulatory Visit: Payer: Self-pay | Admitting: Urology

## 2011-12-23 ENCOUNTER — Other Ambulatory Visit: Payer: Self-pay

## 2011-12-23 NOTE — Telephone Encounter (Signed)
Also, faxed refill request from [facility] in [place] for depakote, last filled 05/22/11.

## 2011-12-23 NOTE — Telephone Encounter (Signed)
Pt request rx Oxycodone; pt going out of town 12/24/11 at 12 noon; pt said OK to wait for Dr Elmer Sow return on 12/24/11; call when ready for pick up.

## 2011-12-24 MED ORDER — OXYCODONE HCL 10 MG PO TABS: ORAL_TABLET | ORAL | Status: DC

## 2011-12-24 MED ORDER — DIVALPROEX SODIUM ER 500 MG PO TB24: 500.0000 mg | ORAL_TABLET | Freq: Two times a day (BID) | ORAL | Status: DC

## 2011-12-24 NOTE — Telephone Encounter (Signed)
Advised patient oxycodone script is ready, placed at front desk for pick up.  Depakote called to [facility] in [place].

## 2011-12-25 ENCOUNTER — Other Ambulatory Visit: Payer: Self-pay | Admitting: Family Medicine

## 2011-12-25 ENCOUNTER — Ambulatory Visit: Payer: Self-pay | Admitting: Urology

## 2012-01-21 ENCOUNTER — Other Ambulatory Visit: Payer: Self-pay | Admitting: Family Medicine

## 2012-01-21 NOTE — Telephone Encounter (Signed)
The pt called and is hoping to get a refill of oxycodone for pick up tomorrow. Thanks!

## 2012-01-22 MED ORDER — OXYCODONE HCL 10 MG PO TABS: ORAL_TABLET | ORAL | Status: DC

## 2012-01-22 NOTE — Telephone Encounter (Signed)
Advised patient script is ready for pick up. 

## 2012-01-26 ENCOUNTER — Ambulatory Visit: Payer: Medicare Other | Admitting: Family Medicine

## 2012-01-26 ENCOUNTER — Ambulatory Visit (INDEPENDENT_AMBULATORY_CARE_PROVIDER_SITE_OTHER): Payer: Medicare Other | Admitting: Family Medicine

## 2012-01-26 ENCOUNTER — Encounter: Payer: Self-pay | Admitting: Family Medicine

## 2012-01-26 VITALS — BP 110/72 | HR 68 | Temp 97.6°F | Wt 177.0 lb

## 2012-01-26 DIAGNOSIS — E119 Type 2 diabetes mellitus without complications: Secondary | ICD-10-CM

## 2012-01-26 DIAGNOSIS — F319 Bipolar disorder, unspecified: Secondary | ICD-10-CM

## 2012-01-26 DIAGNOSIS — N200 Calculus of kidney: Secondary | ICD-10-CM

## 2012-01-26 DIAGNOSIS — E039 Hypothyroidism, unspecified: Secondary | ICD-10-CM

## 2012-01-26 LAB — HEMOGLOBIN A1C: Hgb A1c MFr Bld: 8 % — ABNORMAL HIGH (ref 4.6–6.5)

## 2012-01-26 LAB — TSH: TSH: 2.06 u[IU]/mL (ref 0.35–5.50)

## 2012-01-26 NOTE — Progress Notes (Signed)
56 yo here to discuss several issues.    S/p lithotripsy.  Saw Dr. Orson Slick and would like to see another urologist. Bluford Main like she is still passing kidney stones regularly.  DM- does not check her CBGs regulary.  Currently on Glipize-Metformin 2.5/500 mg- 2 tabs two times a day, Lantus 36 units daily.  Denies any episodes of hypoglycemic symptoms.  Recent a1cs show poor control, refused rechecking it at her last appointment.  She feels her CBGs are now better. Lab Results  Component Value Date   HGBA1C 9.0* 02/25/2011   Not eating as many eating.  Lost 10 pounds with diet.  Gastroparesis is a little better- still vomits occasionally and belches quite a bit.  Bipolar disorder- symptoms improved. Previously followed by psychiatry, Dr. Janeece Riggers. Could not tolerate Seroquel. Had been stable on Depakote. . No longer feeling manic.  Likes her new psychiatry. No SI or HI.   Review of Systems       See HPI  Patient Active Problem List  Diagnosis  . HYPOTHYROIDISM  . DIABETES MELLITUS, TYPE II  . HYPERLIPIDEMIA  . BIPOLAR DISORDER UNSPECIFIED  . ANXIETY  . Other chronic pain  . HYPERTENSION  . CORONARY ARTERY DISEASE  . LOW BACK PAIN  . NEPHROLITHIASIS, HX OF  . Petechiae  . Vomiting  . Gastroparesis due to DM  . Personal history of colonic polyps  . DDD (degenerative disc disease), lumbar   Past Medical History  Diagnosis Date  . CAD (coronary artery disease)     multiple caths  . Diabetes mellitus   . Hyperlipidemia   . Hypertension   . Thyroid disease     hypothyroid  . Low back pain   . Nephrolithiasis   . Fibromyalgia   . Bipolar disorder   . Anxiety    Past Surgical History  Procedure Date  . Vaginal hysterectomy   . Tonsillectomy   . Colonoscopy multiple    2005 [place] records not available-polyps x 3   History  Substance Use Topics  . Smoking status: Never Smoker   . Smokeless tobacco: Never Used  . Alcohol Use: No   Family History  Problem  Relation Age of Onset  . Hypertension Mother   . Diabetes Mother   . Bipolar disorder Father   . Colon cancer Paternal Aunt   . Colon cancer Paternal Grandmother    Allergies  Allergen Reactions  . Byetta 10 Mcg Pen (Exenatide) Nausea And Vomiting  . Quetiapine    Current Outpatient Prescriptions on File Prior to Visit  Medication Sig Dispense Refill  . aspirin 81 MG tablet Take 81 mg by mouth daily.      Marland Kitchen BAYER MICROLET LANCETS lancets Use to check blood sugar as directed.  100 each  12  . Blood Glucose Monitoring Suppl (BAYER BREEZE 2 SYSTEM) W/DEVICE KIT Use as directed to check blood sugar       . colchicine (COLCRYS) 0.6 MG tablet Take 1 tablet (0.6 mg total) by mouth daily.  90 tablet  3  . diltiazem (TAZTIA XT) 180 MG 24 hr capsule Take 1 capsule (180 mg total) by mouth daily.  90 capsule  1  . divalproex (DEPAKOTE ER) 500 MG 24 hr tablet Take 1 tablet (500 mg total) by mouth 2 (two) times daily.  90 tablet  0  . DULoxetine (CYMBALTA) 60 MG capsule Take 1 capsule (60 mg total) by mouth daily.  90 capsule  3  . glipiZIDE-metformin (METAGLIP) 2.5-500 MG  per tablet TAKE 2 TABLETS BY MOUTH TWICE DAILY BEFORE A MEAL  120 tablet  6  . Glucose Blood (BAYER BREEZE 2 TEST) DISK Use to check blood sugar as directed       . insulin glargine (LANTUS SOLOSTAR) 100 UNIT/ML injection Inject 26 Units into the skin at bedtime. 46 units daily  4140 mL  12  . Insulin Pen Needle 31G X 5 MM MISC Use to check blood sugar as directed  100 each  12  . Iodoquinol-HC (VYTONE) 1 % CREA Apply thin layer to area bid   1 Tube  3  . levothyroxine (SYNTHROID, LEVOTHROID) 175 MCG tablet Take 175 mcg by mouth daily.        . Liraglutide 18 MG/3ML SOLN 0.6 mg once daily for 1 week; then increase to 1.2 mg once daily  6 mL  12  . lisinopril (PRINIVIL,ZESTRIL) 5 MG tablet Take 1 tablet (5 mg total) by mouth daily.  90 tablet  3  . Multiple Vitamin (MULTIVITAMIN) capsule Take 1 capsule by mouth daily.        .  Omega-3 Fatty Acids (FISH OIL) 1000 MG CAPS Take one by mouth daily      . ondansetron (ZOFRAN) 4 MG tablet TAKE ONE TABLET BY MOUTH EVERY 8 HOURS AS NEEDED FOR NAUSEA  20 tablet  0  . Oxycodone HCl 10 MG TABS 1 tab by mouth every eight hours as needed for pain.  90 tablet  0  . pantoprazole (PROTONIX) 40 MG tablet Take 1 tablet (40 mg total) by mouth daily.  90 tablet  3  . promethazine (PHENERGAN) 12.5 MG tablet Take 12.5 mg by mouth every 8 (eight) hours as needed.      . rosuvastatin (CRESTOR) 20 MG tablet Take 1 tablet (20 mg total) by mouth daily.  90 tablet  3  . traMADol (ULTRAM) 50 MG tablet Take 2 tablets four times a day for pain.  240 tablet  0    The PMH, PSH, Social History, Family History, Medications, and allergies have been reviewed in [facility], and have been updated if relevant.  Physical Exam BP 110/72  Pulse 68  Temp 97.6 F (36.4 C) (Oral)  Wt 177 lb (80.287 kg) Wt Readings from Last 3 Encounters:  01/26/12 177 lb (80.287 kg)  11/24/11 182 lb (82.555 kg)  10/06/11 185 lb 9.6 oz (84.188 kg)    General:  alert, well-developed, and well-nourished.   Head:  normocephalic and atraumatic.   Nose:  no external deformity.   Mouth:  Oral mucosa and oropharynx without lesions or exudates.  Teeth in good repair. Lungs:  Normal respiratory effort, chest expands symmetrically. Lungs are clear to auscultation, no crackles or wheezes. Heart:  Normal rate and regular rhythm. S1 and S2 normal without gallop, murmur, click, rub or other extra sounds. Abdomen:  Bowel sounds positive,abdomen soft and non-tender.  Some tightness of paraspinous muscles. Psych:  Cognition and judgment appear intact. Alert and cooperative with normal attention span and concentration. No apparent delusions, illusions, hallucinations  Assessment and Plan: 1. DIABETES MELLITUS, TYPE II  Improved per pt. Recheck a1c today. Hemoglobin A1c  2. HYPOTHYROIDISM  Recheck TSH today. TSH  3. BIPOLAR DISORDER  UNSPECIFIED  Improved on current meds.   4. Kidney stone  Deteriorated.  Per pt, will refer to another urologist for a second opinion. Ambulatory referral to Urology

## 2012-01-26 NOTE — Patient Instructions (Signed)
Please stop by to see Lori Phillips on your way out to set up your urology appointment.  We will call with your lab work.

## 2012-02-03 ENCOUNTER — Ambulatory Visit: Payer: Self-pay | Admitting: Urology

## 2012-02-19 ENCOUNTER — Other Ambulatory Visit: Payer: Self-pay

## 2012-02-19 NOTE — Telephone Encounter (Signed)
Pt left v/m requesting rx for oxycodone. Pt needs to pick up 02/20/12.call when ready for pick up.

## 2012-02-20 MED ORDER — OXYCODONE HCL 10 MG PO TABS: ORAL_TABLET | ORAL | Status: DC

## 2012-02-20 NOTE — Telephone Encounter (Signed)
Left voicemail letting pt know Rx was ready for pickup

## 2012-02-22 ENCOUNTER — Other Ambulatory Visit: Payer: Self-pay | Admitting: Family Medicine

## 2012-02-25 ENCOUNTER — Other Ambulatory Visit: Payer: Self-pay | Admitting: *Deleted

## 2012-02-25 MED ORDER — TRAMADOL HCL 50 MG PO TABS: ORAL_TABLET | ORAL | Status: DC

## 2012-02-25 NOTE — Telephone Encounter (Signed)
Bridget Hartshorn faxed refill request Tramadol; last filled 11/21/11.Please advise.

## 2012-02-25 NOTE — Addendum Note (Signed)
Addended by: Eliezer Bottom on: 02/25/2012 11:47 AM   Modules accepted: Orders

## 2012-02-25 NOTE — Telephone Encounter (Signed)
[facility], [facility]

## 2012-02-27 ENCOUNTER — Other Ambulatory Visit: Payer: Self-pay

## 2012-02-27 MED ORDER — LEVOTHYROXINE SODIUM 175 MCG PO TABS: 175.0000 ug | ORAL_TABLET | Freq: Every day | ORAL | Status: DC

## 2012-02-27 MED ORDER — DIVALPROEX SODIUM ER 500 MG PO TB24: 500.0000 mg | ORAL_TABLET | Freq: Two times a day (BID) | ORAL | Status: DC

## 2012-02-27 NOTE — Telephone Encounter (Signed)
Pt request refill Depakote and synthroid to [facility] in [place];pt said Dr Dayton Martes had been filling Depakote for pt; pt has been seeing counselor but has not seen psychiatrist yet; pt also wanted to know if she should continue taking name brand synthroid or get generic. Pt presently taking name brand and that was what endocrinologist recommended to pt. Pt also wants to know if next refill of Tramadol if she can get more than the one time refill.Please advise.pt request call back when med sent in.

## 2012-02-27 NOTE — Telephone Encounter (Signed)
Depakote requires laboratory monitoring so she needs to have this prescribed by her psychiatrist.  Rip Harbour to refill once but psychiatrist needs to refill further once she establishes with psychiatrist.   Yes continue trade synthroid if that is what endocrinologist suggested.  Yes, ok to have additional refill of Tramadol.

## 2012-02-27 NOTE — Telephone Encounter (Signed)
Advised patient.  She says she doesn't see psychiatrist until 12/28, so will likely need another refill on depakote before then.  She just got tramadol refilled so is ok with that for awhile.

## 2012-03-22 ENCOUNTER — Other Ambulatory Visit: Payer: Self-pay

## 2012-03-22 ENCOUNTER — Other Ambulatory Visit: Payer: Self-pay | Admitting: Family Medicine

## 2012-03-22 ENCOUNTER — Other Ambulatory Visit: Payer: Self-pay | Admitting: *Deleted

## 2012-03-22 MED ORDER — OXYCODONE HCL 10 MG PO TABS: ORAL_TABLET | ORAL | Status: DC

## 2012-03-22 MED ORDER — TRAMADOL HCL 50 MG PO TABS: ORAL_TABLET | ORAL | Status: DC

## 2012-03-22 NOTE — Telephone Encounter (Signed)
Advised patient script is ready for pick up. 

## 2012-03-22 NOTE — Telephone Encounter (Signed)
[facility] 

## 2012-03-22 NOTE — Telephone Encounter (Signed)
Faxed refill request from [facility] [place], last filled 02/25/12.

## 2012-03-22 NOTE — Telephone Encounter (Signed)
Pt left v/m requesting rx oxycodone. Call when ready for pick up. Pt request to pick up rx on 03/23/12.Please advise.

## 2012-03-23 ENCOUNTER — Ambulatory Visit (INDEPENDENT_AMBULATORY_CARE_PROVIDER_SITE_OTHER): Payer: Medicare Other | Admitting: Family Medicine

## 2012-03-23 ENCOUNTER — Encounter: Payer: Self-pay | Admitting: Family Medicine

## 2012-03-23 VITALS — BP 142/80 | HR 72 | Temp 97.7°F | Wt 184.0 lb

## 2012-03-23 DIAGNOSIS — R229 Localized swelling, mass and lump, unspecified: Secondary | ICD-10-CM

## 2012-03-23 DIAGNOSIS — R2230 Localized swelling, mass and lump, unspecified upper limb: Secondary | ICD-10-CM

## 2012-03-23 MED ORDER — GABAPENTIN 100 MG PO CAPS: 100.0000 mg | ORAL_CAPSULE | Freq: Three times a day (TID) | ORAL | Status: DC

## 2012-03-23 MED ORDER — TRAMADOL HCL 50 MG PO TABS: ORAL_TABLET | ORAL | Status: DC

## 2012-03-23 NOTE — Patient Instructions (Addendum)
Good to see you. Please stop by to see Lori Phillips on your way out to set up your ultrasound.   

## 2012-03-23 NOTE — Progress Notes (Signed)
Subjective:    Patient ID: Lori Phillips, female    DOB: [birth year], 56 y.o.   MRN: [id number]  HPI  Here for painful knot in right arm.  She noticed it several months ago but now more painful.  She is not sure if it has grown in size. No surrounding erythema or warmth. No fevers or chills.  No n/v/d.  No drainage from the mass.  Patient Active Problem List  Diagnosis  . HYPOTHYROIDISM  . DIABETES MELLITUS, TYPE II  . HYPERLIPIDEMIA  . BIPOLAR DISORDER UNSPECIFIED  . ANXIETY  . Other chronic pain  . HYPERTENSION  . CORONARY ARTERY DISEASE  . LOW BACK PAIN  . NEPHROLITHIASIS, HX OF  . Petechiae  . Vomiting  . Gastroparesis due to DM  . Personal history of colonic polyps  . DDD (degenerative disc disease), lumbar  . Mass of arm   Past Medical History  Diagnosis Date  . CAD (coronary artery disease)     multiple caths  . Diabetes mellitus   . Hyperlipidemia   . Hypertension   . Thyroid disease     hypothyroid  . Low back pain   . Nephrolithiasis   . Fibromyalgia   . Bipolar disorder   . Anxiety    Past Surgical History  Procedure Date  . Vaginal hysterectomy   . Tonsillectomy   . Colonoscopy multiple    2005 [place] records not available-polyps x 3   History  Substance Use Topics  . Smoking status: Never Smoker   . Smokeless tobacco: Never Used  . Alcohol Use: No   Family History  Problem Relation Age of Onset  . Hypertension Mother   . Diabetes Mother   . Bipolar disorder Father   . Colon cancer Paternal Aunt   . Colon cancer Paternal Grandmother    Allergies  Allergen Reactions  . Byetta 10 Mcg Pen (Exenatide) Nausea And Vomiting  . Quetiapine    Current Outpatient Prescriptions on File Prior to Visit  Medication Sig Dispense Refill  . aspirin 81 MG tablet Take 81 mg by mouth daily.      . B-D ULTRAFINE III SHORT PEN 31G X 8 MM MISC USE AS DIRECTED  100 each  5  . BAYER MICROLET LANCETS lancets Use to check blood sugar as directed.  100  each  12  . Blood Glucose Monitoring Suppl (BAYER BREEZE 2 SYSTEM) W/DEVICE KIT Use as directed to check blood sugar       . COLCRYS 0.6 MG tablet TAKE 1 TABLET BY MOUTH EVERY DAY  90 tablet  1  . CRESTOR 20 MG tablet TAKE ONE TABLET BY MOUTH DAILY  90 tablet  1  . CYMBALTA 60 MG capsule TAKE ONE CAPSULE BY MOUTH DAILY  90 capsule  0  . diltiazem (TAZTIA XT) 180 MG 24 hr capsule Take 1 capsule (180 mg total) by mouth daily.  90 capsule  1  . divalproex (DEPAKOTE ER) 500 MG 24 hr tablet TAKE 1 TABLET BY MOUTH TWICE DAILY  60 tablet  0  . glipiZIDE-metformin (METAGLIP) 2.5-500 MG per tablet TAKE 2 TABLETS BY MOUTH TWICE DAILY BEFORE A MEAL  120 tablet  6  . Glucose Blood (BAYER BREEZE 2 TEST) DISK Use to check blood sugar as directed       . Insulin Pen Needle 31G X 5 MM MISC Use to check blood sugar as directed  100 each  12  . Iodoquinol-HC (VYTONE) 1 % CREA Apply thin  layer to area bid   1 Tube  3  . LANTUS SOLOSTAR 100 UNIT/ML injection INJECT 46 UNITS UNDER THE SKIN DAILY  45 mL  5  . levothyroxine (SYNTHROID, LEVOTHROID) 175 MCG tablet Take 1 tablet (175 mcg total) by mouth daily.  90 tablet  0  . Liraglutide 18 MG/3ML SOLN 0.6 mg once daily for 1 week; then increase to 1.2 mg once daily  6 mL  12  . lisinopril (PRINIVIL,ZESTRIL) 5 MG tablet TAKE 1 TABLET BY MOUTH DAILY  90 tablet  1  . Multiple Vitamin (MULTIVITAMIN) capsule Take 1 capsule by mouth daily.        . Omega-3 Fatty Acids (FISH OIL) 1000 MG CAPS Take one by mouth daily      . Oxycodone HCl 10 MG TABS 1 tab by mouth every eight hours as needed for pain.  90 tablet  0  . pantoprazole (PROTONIX) 40 MG tablet TAKE ONE TABLET BY MOUTH DAILY  90 tablet  1  . gabapentin (NEURONTIN) 100 MG capsule Take 1 capsule (100 mg total) by mouth 3 (three) times daily.  90 capsule  3   The PMH, PSH, Social History, Family History, Medications, and allergies have been reviewed in [facility], and have been updated if relevant.   Review of Systems See  HPI    Objective:   Physical Exam  Constitutional: She appears well-developed. No distress.  HENT:  Head: Normocephalic.  Skin: Skin is warm, dry and intact.     Psychiatric: Her mood appears not anxious. She does not exhibit a depressed mood.    BP 142/80  Pulse 72  Temp 97.7 F (36.5 C)  Wt 184 lb (83.462 kg)       Assessment & Plan:   1. Mass of arm  [place] Misc Soft Tissue, [place] Extrem Up Right Ltd  New- unclear etiology- ? subcutaneous cyst vs lymph node (less likely due to location).  Will get ultrasound for further evaluation.  No indication of infection.

## 2012-03-24 ENCOUNTER — Ambulatory Visit: Payer: Medicare Other | Admitting: Family Medicine

## 2012-03-30 ENCOUNTER — Other Ambulatory Visit: Payer: Self-pay | Admitting: *Deleted

## 2012-03-30 MED ORDER — LEVOTHYROXINE SODIUM 175 MCG PO TABS: 175.0000 ug | ORAL_TABLET | Freq: Every day | ORAL | Status: DC

## 2012-04-05 ENCOUNTER — Other Ambulatory Visit: Payer: Self-pay | Admitting: Family Medicine

## 2012-04-22 ENCOUNTER — Other Ambulatory Visit: Payer: Self-pay

## 2012-04-22 MED ORDER — OXYCODONE HCL 10 MG PO TABS: ORAL_TABLET | ORAL | Status: DC

## 2012-04-22 NOTE — Telephone Encounter (Signed)
Pt left v/m requesting rx oxycodone. Pt request call back when ready for pick up.(pt would like to pick up on 04/23/12).

## 2012-04-22 NOTE — Telephone Encounter (Signed)
Script placed on doctor's desk for signature.

## 2012-04-23 ENCOUNTER — Ambulatory Visit
Admission: RE | Admit: 2012-04-23 | Discharge: 2012-04-23 | Disposition: A | Discharge status: Home / Self Care | Payer: Medicare Other | Source: Ambulatory Visit | Attending: Family Medicine | Admitting: Family Medicine

## 2012-04-23 DIAGNOSIS — R2230 Localized swelling, mass and lump, unspecified upper limb: Secondary | ICD-10-CM

## 2012-04-23 NOTE — Telephone Encounter (Signed)
Script given to patient

## 2012-04-25 ENCOUNTER — Other Ambulatory Visit: Payer: Self-pay | Admitting: Family Medicine

## 2012-04-26 ENCOUNTER — Encounter: Payer: Self-pay | Admitting: Family Medicine

## 2012-04-26 ENCOUNTER — Ambulatory Visit (INDEPENDENT_AMBULATORY_CARE_PROVIDER_SITE_OTHER): Payer: Medicare Other | Admitting: Family Medicine

## 2012-04-26 VITALS — BP 158/82 | HR 60 | Temp 98.1°F | Wt 182.0 lb

## 2012-04-26 DIAGNOSIS — E119 Type 2 diabetes mellitus without complications: Secondary | ICD-10-CM

## 2012-04-26 DIAGNOSIS — E785 Hyperlipidemia, unspecified: Secondary | ICD-10-CM

## 2012-04-26 DIAGNOSIS — F319 Bipolar disorder, unspecified: Secondary | ICD-10-CM

## 2012-04-26 DIAGNOSIS — E039 Hypothyroidism, unspecified: Secondary | ICD-10-CM

## 2012-04-26 LAB — COMPREHENSIVE METABOLIC PANEL
ALT: 10 U/L (ref 0–35)
AST: 15 U/L (ref 0–37)
Creatinine, Ser: 1.1 mg/dL (ref 0.4–1.2)
Total Bilirubin: 0.6 mg/dL (ref 0.3–1.2)

## 2012-04-26 LAB — HEMOGLOBIN A1C: Hgb A1c MFr Bld: 8.2 % — ABNORMAL HIGH (ref 4.6–6.5)

## 2012-04-26 MED ORDER — GABAPENTIN 100 MG PO CAPS: 200.0000 mg | ORAL_CAPSULE | Freq: Three times a day (TID) | ORAL | Status: DC

## 2012-04-26 NOTE — Progress Notes (Signed)
57 yo here for follow up.    DM- does not check her CBGs regulary.   Denies any episodes of hypoglycemic symptoms.  Recent a1cs show poor control, refused rechecking it at her last appointment.   Lab Results  Component Value Date   HGBA1C 8.0* 01/26/2012   Neuropathy- better with Gabapentin, takes 200 mg three times daily .   Bipolar disorder- symptoms have not been well controlled lately. Could not tolerate Seroquel. On Depakote and Abilfy. Abilify just added Dec 27th- has follow up later this month. Followed by Dr. Sherlyn Lick.  No SI or HI.  Hypothyroidism- on synthroid 175 mcg daily. Lab Results  Component Value Date   TSH 2.06 01/26/2012   Patient Active Problem List  Diagnosis  . HYPOTHYROIDISM  . DIABETES MELLITUS, TYPE II  . HYPERLIPIDEMIA  . BIPOLAR DISORDER UNSPECIFIED  . ANXIETY  . Other chronic pain  . HYPERTENSION  . CORONARY ARTERY DISEASE  . LOW BACK PAIN  . NEPHROLITHIASIS, HX OF  . Petechiae  . Vomiting  . Gastroparesis due to DM  . Personal history of colonic polyps  . DDD (degenerative disc disease), lumbar  . Mass of arm   Past Medical History  Diagnosis Date  . CAD (coronary artery disease)     multiple caths  . Diabetes mellitus   . Hyperlipidemia   . Hypertension   . Thyroid disease     hypothyroid  . Low back pain   . Nephrolithiasis   . Fibromyalgia   . Bipolar disorder   . Anxiety    Past Surgical History  Procedure Date  . Vaginal hysterectomy   . Tonsillectomy   . Colonoscopy multiple    2005 [place] records not available-polyps x 3   History  Substance Use Topics  . Smoking status: Never Smoker   . Smokeless tobacco: Never Used  . Alcohol Use: No   Family History  Problem Relation Age of Onset  . Hypertension Mother   . Diabetes Mother   . Bipolar disorder Father   . Colon cancer Paternal Aunt   . Colon cancer Paternal Grandmother    Allergies  Allergen Reactions  . Byetta 10 Mcg Pen (Exenatide) Nausea And  Vomiting  . Quetiapine    Current Outpatient Prescriptions on File Prior to Visit  Medication Sig Dispense Refill  . ARIPiprazole (ABILIFY PO)       . aspirin 81 MG tablet Take 81 mg by mouth daily.      . B-D ULTRAFINE III SHORT PEN 31G X 8 MM MISC USE AS DIRECTED  100 each  5  . BAYER MICROLET LANCETS lancets Use to check blood sugar as directed.  100 each  12  . Blood Glucose Monitoring Suppl (BAYER BREEZE 2 SYSTEM) W/DEVICE KIT Use as directed to check blood sugar       . COLCRYS 0.6 MG tablet TAKE 1 TABLET BY MOUTH EVERY DAY  90 tablet  1  . CRESTOR 20 MG tablet TAKE ONE TABLET BY MOUTH DAILY  90 tablet  1  . CYMBALTA 60 MG capsule TAKE ONE CAPSULE BY MOUTH DAILY  90 capsule  0  . diltiazem (TAZTIA XT) 180 MG 24 hr capsule Take 1 capsule (180 mg total) by mouth daily.  90 capsule  1  . divalproex (DEPAKOTE ER) 500 MG 24 hr tablet TAKE 1 TABLET BY MOUTH TWICE DAILY  60 tablet  0  . gabapentin (NEURONTIN) 100 MG capsule Take 2 capsules (200 mg total)  by mouth 3 (three) times daily.  180 capsule  3  . glipiZIDE-metformin (METAGLIP) 2.5-500 MG per tablet TAKE 2 TABLETS BY MOUTH TWICE DAILY BEFORE A MEAL  120 tablet  6  . Glucose Blood (BAYER BREEZE 2 TEST) DISK Use to check blood sugar as directed       . Insulin Pen Needle 31G X 5 MM MISC Use to check blood sugar as directed  100 each  12  . Iodoquinol-HC (VYTONE) 1 % CREA Apply thin layer to area bid   1 Tube  3  . LANTUS SOLOSTAR 100 UNIT/ML injection INJECT 46 UNITS UNDER THE SKIN DAILY  45 mL  5  . levothyroxine (SYNTHROID, LEVOTHROID) 175 MCG tablet Take 1 tablet (175 mcg total) by mouth daily.  30 tablet  5  . lisinopril (PRINIVIL,ZESTRIL) 5 MG tablet TAKE 1 TABLET BY MOUTH DAILY  90 tablet  1  . Multiple Vitamin (MULTIVITAMIN) capsule Take 1 capsule by mouth daily.        . Omega-3 Fatty Acids (FISH OIL) 1000 MG CAPS Take one by mouth daily      . Oxycodone HCl 10 MG TABS 1 tab by mouth every eight hours as needed for pain.  90  tablet  0  . pantoprazole (PROTONIX) 40 MG tablet TAKE ONE TABLET BY MOUTH DAILY  90 tablet  1  . traMADol (ULTRAM) 50 MG tablet TAKE 2 TABLETS BY MOUTH 4 TIMES A DAY  240 tablet  0  . VICTOZA 18 MG/3ML SOLN INJECT 0.6MG  UNDER THE SKIN ONCE DAILY FOR 1 WEEK THEN INCREASE TO 1 .2MG  ONCE DAILY THEREAFTER  6 mL  0   The PMH, PSH, Social History, Family History, Medications, and allergies have been reviewed in [facility], and have been updated if relevant.    Review of Systems       See HPI  Patient Active Problem List  Diagnosis  . HYPOTHYROIDISM  . DIABETES MELLITUS, TYPE II  . HYPERLIPIDEMIA  . BIPOLAR DISORDER UNSPECIFIED  . ANXIETY  . Other chronic pain  . HYPERTENSION  . CORONARY ARTERY DISEASE  . LOW BACK PAIN  . NEPHROLITHIASIS, HX OF  . Petechiae  . Vomiting  . Gastroparesis due to DM  . Personal history of colonic polyps  . DDD (degenerative disc disease), lumbar  . Mass of arm   Past Medical History  Diagnosis Date  . CAD (coronary artery disease)     multiple caths  . Diabetes mellitus   . Hyperlipidemia   . Hypertension   . Thyroid disease     hypothyroid  . Low back pain   . Nephrolithiasis   . Fibromyalgia   . Bipolar disorder   . Anxiety    Past Surgical History  Procedure Date  . Vaginal hysterectomy   . Tonsillectomy   . Colonoscopy multiple    2005 [place] records not available-polyps x 3   History  Substance Use Topics  . Smoking status: Never Smoker   . Smokeless tobacco: Never Used  . Alcohol Use: No   Family History  Problem Relation Age of Onset  . Hypertension Mother   . Diabetes Mother   . Bipolar disorder Father   . Colon cancer Paternal Aunt   . Colon cancer Paternal Grandmother    Allergies  Allergen Reactions  . Byetta 10 Mcg Pen (Exenatide) Nausea And Vomiting  . Quetiapine    Current Outpatient Prescriptions on File Prior to Visit  Medication Sig Dispense Refill  .  ARIPiprazole (ABILIFY PO)       . aspirin 81 MG  tablet Take 81 mg by mouth daily.      . B-D ULTRAFINE III SHORT PEN 31G X 8 MM MISC USE AS DIRECTED  100 each  5  . BAYER MICROLET LANCETS lancets Use to check blood sugar as directed.  100 each  12  . Blood Glucose Monitoring Suppl (BAYER BREEZE 2 SYSTEM) W/DEVICE KIT Use as directed to check blood sugar       . COLCRYS 0.6 MG tablet TAKE 1 TABLET BY MOUTH EVERY DAY  90 tablet  1  . CRESTOR 20 MG tablet TAKE ONE TABLET BY MOUTH DAILY  90 tablet  1  . CYMBALTA 60 MG capsule TAKE ONE CAPSULE BY MOUTH DAILY  90 capsule  0  . diltiazem (TAZTIA XT) 180 MG 24 hr capsule Take 1 capsule (180 mg total) by mouth daily.  90 capsule  1  . divalproex (DEPAKOTE ER) 500 MG 24 hr tablet TAKE 1 TABLET BY MOUTH TWICE DAILY  60 tablet  0  . gabapentin (NEURONTIN) 100 MG capsule Take 1 capsule (100 mg total) by mouth 3 (three) times daily.  90 capsule  3  . glipiZIDE-metformin (METAGLIP) 2.5-500 MG per tablet TAKE 2 TABLETS BY MOUTH TWICE DAILY BEFORE A MEAL  120 tablet  6  . Glucose Blood (BAYER BREEZE 2 TEST) DISK Use to check blood sugar as directed       . Insulin Pen Needle 31G X 5 MM MISC Use to check blood sugar as directed  100 each  12  . Iodoquinol-HC (VYTONE) 1 % CREA Apply thin layer to area bid   1 Tube  3  . LANTUS SOLOSTAR 100 UNIT/ML injection INJECT 46 UNITS UNDER THE SKIN DAILY  45 mL  5  . levothyroxine (SYNTHROID, LEVOTHROID) 175 MCG tablet Take 1 tablet (175 mcg total) by mouth daily.  30 tablet  5  . lisinopril (PRINIVIL,ZESTRIL) 5 MG tablet TAKE 1 TABLET BY MOUTH DAILY  90 tablet  1  . Multiple Vitamin (MULTIVITAMIN) capsule Take 1 capsule by mouth daily.        . Omega-3 Fatty Acids (FISH OIL) 1000 MG CAPS Take one by mouth daily      . Oxycodone HCl 10 MG TABS 1 tab by mouth every eight hours as needed for pain.  90 tablet  0  . pantoprazole (PROTONIX) 40 MG tablet TAKE ONE TABLET BY MOUTH DAILY  90 tablet  1  . traMADol (ULTRAM) 50 MG tablet TAKE 2 TABLETS BY MOUTH 4 TIMES A DAY  240  tablet  0  . VICTOZA 18 MG/3ML SOLN INJECT 0.6MG  UNDER THE SKIN ONCE DAILY FOR 1 WEEK THEN INCREASE TO 1 .2MG  ONCE DAILY THEREAFTER  6 mL  0    The PMH, PSH, Social History, Family History, Medications, and allergies have been reviewed in [facility], and have been updated if relevant.  Physical Exam BP 158/82  Pulse 60  Temp 98.1 F (36.7 C)  Wt 182 lb (82.555 kg)   General:  alert, well-developed, and well-nourished.   Head:  normocephalic and atraumatic.   Nose:  no external deformity.   Mouth:  Oral mucosa and oropharynx without lesions or exudates.  Teeth in good repair. Lungs:  Normal respiratory effort, chest expands symmetrically. Lungs are clear to auscultation, no crackles or wheezes. Heart:  Normal rate and regular rhythm. S1 and S2 normal without gallop, murmur, click, rub or other  extra sounds. Abdomen:  Bowel sounds positive,abdomen soft and non-tender.  Some tightness of paraspinous muscles. Psych:  Cognition and judgment appear intact. Alert and cooperative with normal attention span and concentration. No apparent delusions, illusions, hallucinations  Assessment and Plan: 1. DIABETES MELLITUS, TYPE II  Improved per pt. Recheck a1c today. Hemoglobin A1c  2. HYPOTHYROIDISM  Recheck TSH today. TSH  3. BIPOLAR DISORDER UNSPECIFIED  Meds being adjusted by psychiatry.  Will check depakote level per psychiatry request.

## 2012-04-26 NOTE — Patient Instructions (Signed)
Happy New Year. We will call you with your lab results.

## 2012-04-27 ENCOUNTER — Other Ambulatory Visit: Payer: Self-pay | Admitting: Family Medicine

## 2012-04-27 DIAGNOSIS — E039 Hypothyroidism, unspecified: Secondary | ICD-10-CM

## 2012-04-27 DIAGNOSIS — E119 Type 2 diabetes mellitus without complications: Secondary | ICD-10-CM

## 2012-04-27 LAB — VALPROIC ACID LEVEL: Valproic Acid Lvl: 90.5 ug/mL (ref 50.0–100.0)

## 2012-05-19 ENCOUNTER — Other Ambulatory Visit: Payer: Self-pay

## 2012-05-19 MED ORDER — OXYCODONE HCL 10 MG PO TABS: ORAL_TABLET | ORAL | Status: DC

## 2012-05-19 NOTE — Telephone Encounter (Signed)
Pt left v/m requesting rx oxycodone. Call when ready for pick up. 

## 2012-05-19 NOTE — Telephone Encounter (Signed)
Script printed and on your desk for signature. 

## 2012-05-20 NOTE — Telephone Encounter (Signed)
Advised patient script is ready for pick up, placed at front desk. 

## 2012-05-24 ENCOUNTER — Other Ambulatory Visit: Payer: Self-pay | Admitting: Family Medicine

## 2012-05-26 ENCOUNTER — Other Ambulatory Visit: Payer: Medicare Other

## 2012-06-09 ENCOUNTER — Other Ambulatory Visit: Payer: Self-pay | Admitting: *Deleted

## 2012-06-09 MED ORDER — TRAMADOL HCL 50 MG PO TABS: ORAL_TABLET | ORAL | Status: DC

## 2012-06-09 NOTE — Telephone Encounter (Signed)
Last filled 04/26/12

## 2012-06-14 ENCOUNTER — Other Ambulatory Visit: Payer: Self-pay | Admitting: Family Medicine

## 2012-06-14 MED ORDER — OXYCODONE HCL 10 MG PO TABS: ORAL_TABLET | ORAL | Status: DC

## 2012-06-14 NOTE — Telephone Encounter (Signed)
Advised patient script is ready for pick up. 

## 2012-06-14 NOTE — Telephone Encounter (Signed)
Patient is requesting a refill of her Oxycodone 10mg  pain medication.  She would like a call when the prescription is ready to be picked up.

## 2012-06-24 ENCOUNTER — Other Ambulatory Visit: Payer: Self-pay | Admitting: *Deleted

## 2012-06-24 MED ORDER — LIRAGLUTIDE 18 MG/3ML ~~LOC~~ SOLN: SUBCUTANEOUS | Status: DC

## 2012-07-12 ENCOUNTER — Other Ambulatory Visit: Payer: Self-pay | Admitting: Family Medicine

## 2012-07-12 MED ORDER — OXYCODONE HCL 10 MG PO TABS: ORAL_TABLET | ORAL | Status: DC

## 2012-07-12 NOTE — Telephone Encounter (Signed)
Advised patient script is ready for pick up, script placed at front desk. 

## 2012-07-12 NOTE — Telephone Encounter (Signed)
Pt states she is calling to request a refill of her Percocet.  Pt states she has to call each month. Last office visit was Jan 2014.  OFFICE PLEASE FOLLOW UP WITH PATIENT WHEN RX IS AVAILABLE FOR PICK UP

## 2012-07-20 ENCOUNTER — Other Ambulatory Visit: Payer: Self-pay

## 2012-07-20 MED ORDER — TRAMADOL HCL 50 MG PO TABS: ORAL_TABLET | ORAL | Status: DC

## 2012-07-20 NOTE — Telephone Encounter (Signed)
Pt request refill tramadol 50 mg to [facility]. Please advise.

## 2012-07-24 ENCOUNTER — Other Ambulatory Visit: Payer: Self-pay | Admitting: Family Medicine

## 2012-08-09 ENCOUNTER — Other Ambulatory Visit: Payer: Self-pay

## 2012-08-09 MED ORDER — OXYCODONE HCL 10 MG PO TABS: ORAL_TABLET | ORAL | Status: DC

## 2012-08-09 NOTE — Telephone Encounter (Signed)
Pt left v/m requesting rx oxycodone. Call when ready for pick up. 

## 2012-08-09 NOTE — Telephone Encounter (Signed)
Advised patient script is ready for pick up. 

## 2012-08-10 ENCOUNTER — Other Ambulatory Visit: Payer: Self-pay | Admitting: *Deleted

## 2012-08-10 MED ORDER — GLUCOSE BLOOD VI DISK: DISK | Status: DC

## 2012-08-12 ENCOUNTER — Ambulatory Visit (INDEPENDENT_AMBULATORY_CARE_PROVIDER_SITE_OTHER): Payer: Medicare Other | Admitting: Family Medicine

## 2012-08-12 ENCOUNTER — Encounter: Payer: Self-pay | Admitting: Family Medicine

## 2012-08-12 ENCOUNTER — Ambulatory Visit: Payer: Self-pay | Admitting: Family Medicine

## 2012-08-12 ENCOUNTER — Telehealth: Payer: Self-pay

## 2012-08-12 VITALS — BP 150/85 | HR 72 | Temp 97.7°F | Wt 193.0 lb

## 2012-08-12 DIAGNOSIS — R229 Localized swelling, mass and lump, unspecified: Secondary | ICD-10-CM

## 2012-08-12 DIAGNOSIS — R2231 Localized swelling, mass and lump, right upper limb: Secondary | ICD-10-CM

## 2012-08-12 MED ORDER — IBUPROFEN 800 MG PO TABS: ORAL_TABLET | ORAL | Status: DC

## 2012-08-12 NOTE — Progress Notes (Signed)
Subjective:    Patient ID: Lori Phillips, female    DOB: 1955/07/04, 57 y.o.   MRN: [id number]   57 yo female here acute onset of right forearm swelling with redness and warmth since yesterday.  No recent travel.  No known trauma to her arm.  No fevers.  No n/v/d.  No CP or SOB.  Did take an ASA last night.  She has not history of malignancy or blood disorders. Never had anything like this before.   Patient Active Problem List  Diagnosis  . HYPOTHYROIDISM  . DIABETES MELLITUS, TYPE II  . HYPERLIPIDEMIA  . BIPOLAR DISORDER UNSPECIFIED  . ANXIETY  . Other chronic pain  . HYPERTENSION  . CORONARY ARTERY DISEASE  . LOW BACK PAIN  . NEPHROLITHIASIS, HX OF  . Gastroparesis due to DM  . Personal history of colonic polyps  . DDD (degenerative disc disease), lumbar  . Mass of arm   Past Medical History  Diagnosis Date  . CAD (coronary artery disease)     multiple caths  . Diabetes mellitus   . Hyperlipidemia   . Hypertension   . Thyroid disease     hypothyroid  . Low back pain   . Nephrolithiasis   . Fibromyalgia   . Bipolar disorder   . Anxiety    Past Surgical History  Procedure Laterality Date  . Vaginal hysterectomy    . Tonsillectomy    . Colonoscopy  multiple    2005 [place] records not available-polyps x 3   History  Substance Use Topics  . Smoking status: Never Smoker   . Smokeless tobacco: Never Used  . Alcohol Use: No   Family History  Problem Relation Age of Onset  . Hypertension Mother   . Diabetes Mother   . Bipolar disorder Father   . Colon cancer Paternal Aunt   . Colon cancer Paternal Grandmother    Allergies  Allergen Reactions  . Byetta 10 Mcg Pen (Exenatide) Nausea And Vomiting  . Quetiapine    Current Outpatient Prescriptions on File Prior to Visit  Medication Sig Dispense Refill  . ARIPiprazole (ABILIFY PO)       . aspirin 81 MG tablet Take 81 mg by mouth daily.      . B-D ULTRAFINE III SHORT PEN 31G X 8 MM MISC USE AS  DIRECTED  100 each  5  . BAYER MICROLET LANCETS lancets Use to check blood sugar as directed.  100 each  12  . Blood Glucose Monitoring Suppl (BAYER BREEZE 2 SYSTEM) W/DEVICE KIT Use as directed to check blood sugar       . COLCRYS 0.6 MG tablet TAKE 1 TABLET BY MOUTH EVERY DAY  90 tablet  1  . CRESTOR 20 MG tablet TAKE ONE TABLET BY MOUTH DAILY  90 tablet  1  . divalproex (DEPAKOTE ER) 500 MG 24 hr tablet TAKE 1 TABLET BY MOUTH TWICE DAILY  60 tablet  0  . DULoxetine (CYMBALTA) 60 MG capsule TAKE ONE CAPSULE BY MOUTH ONCE DAILY  90 capsule  0  . gabapentin (NEURONTIN) 100 MG capsule Take 2 capsules (200 mg total) by mouth 3 (three) times daily.  180 capsule  3  . glipiZIDE-metformin (METAGLIP) 2.5-500 MG per tablet TAKE 2 TABLETS BY MOUTH TWICE DAILY BEFORE A MEAL  360 tablet  1  . Glucose Blood (BAYER BREEZE 2 TEST) DISK Use to check blood sugar up to 3 times a day  100 each  5  . Insulin  Pen Needle 31G X 5 MM MISC Use to check blood sugar as directed  100 each  12  . Iodoquinol-HC (VYTONE) 1 % CREA Apply thin layer to area bid   1 Tube  3  . LANTUS SOLOSTAR 100 UNIT/ML injection INJECT 46 UNITS UNDER THE SKIN DAILY  45 mL  5  . levothyroxine (SYNTHROID, LEVOTHROID) 175 MCG tablet Take 1 tablet (175 mcg total) by mouth daily.  30 tablet  5  . Liraglutide (VICTOZA) 18 MG/3ML SOLN injection Inject 1.2 mg every day  6 mL  5  . lisinopril (PRINIVIL,ZESTRIL) 5 MG tablet TAKE 1 TABLET BY MOUTH DAILY  90 tablet  1  . Multiple Vitamin (MULTIVITAMIN) capsule Take 1 capsule by mouth daily.        . Omega-3 Fatty Acids (FISH OIL) 1000 MG CAPS Take one by mouth daily      . Oxycodone HCl 10 MG TABS 1 tab by mouth every eight hours as needed for pain.  90 tablet  0  . pantoprazole (PROTONIX) 40 MG tablet TAKE ONE TABLET BY MOUTH DAILY  90 tablet  1  . TAZTIA XT 180 MG 24 hr capsule TAKE ONE CAPSULE BY MOUTH EVERY DAY  90 capsule  0  . traMADol (ULTRAM) 50 MG tablet TAKE 2 TABLETS BY MOUTH 4 TIMES A DAY   240 tablet  0   No current facility-administered medications on file prior to visit.   The PMH, PSH, Social History, Family History, Medications, and allergies have been reviewed in [facility], and have been updated if relevant.   Review of Systems See HPI    Objective:   Physical Exam  BP 150/85  Pulse 72  Temp(Src) 97.7 F (36.5 C)  Wt 193 lb (87.544 kg)  BMI 31.17 kg/m2  Constitutional: She appears well-developed. No distress.  HENT:  Head: Normocephalic.  Right arm: Large subcutaneaous mass on right forearm, very TTP, warm Psychiatric: Her mood appears not anxious. She does not exhibit a depressed mood.     Assessment & Plan:   1. Mass of arm, right Concerning for DVT.  Will order stat duplex of right arm.  Continue ASA.  Red flag symptoms given. The patient indicates understanding of these issues and agrees with the plan.  - Upper extremity Venous Duplex Right; Future

## 2012-08-12 NOTE — Telephone Encounter (Signed)
Dr Dayton Martes said to tell pt she can leave, negative for dvt, superficial clot seen but not worried about that type clot(will not break off and go to lung), pt to continue taking ASA, Dr Dayton Martes will wait for final report from radiologist and then contact pt.  Judeth Cornfield voiced understanding and will tell pt.

## 2012-08-12 NOTE — Telephone Encounter (Signed)
Lori Phillips [place] [facility] called report [place] rt arm; negative for deep vein thrombosis; superficial clots basilic vein below elbow.Please advise. Pt is waiting.

## 2012-08-12 NOTE — Patient Instructions (Addendum)
See Shirlee Limerick about your referral before you leave today. I hope to get your ultrasound done today.  We will call you with the results. Please continue to take your aspirin. Go straight to ER if symptoms worsen.

## 2012-08-12 NOTE — Addendum Note (Signed)
Addended by: Dianne Dun on: 08/12/2012 04:03 PM   Modules accepted: Orders

## 2012-08-16 ENCOUNTER — Ambulatory Visit (INDEPENDENT_AMBULATORY_CARE_PROVIDER_SITE_OTHER): Payer: Medicare Other | Admitting: Family Medicine

## 2012-08-16 ENCOUNTER — Encounter: Payer: Self-pay | Admitting: Family Medicine

## 2012-08-16 VITALS — BP 130/80 | HR 68 | Temp 97.7°F

## 2012-08-16 DIAGNOSIS — R2231 Localized swelling, mass and lump, right upper limb: Secondary | ICD-10-CM

## 2012-08-16 DIAGNOSIS — I808 Phlebitis and thrombophlebitis of other sites: Secondary | ICD-10-CM | POA: Insufficient documentation

## 2012-08-16 NOTE — Progress Notes (Signed)
Subjective:    Patient ID: Lori Phillips, female    DOB: [birth year], 57 y.o.   MRN: [id number]   57 yo female here for follow up from last week- I saw on 08/12/2012 her for acute onset of mass in right arm that was painful and warm to touch.  No recent travel.  No known trauma to her arm.  No fevers.  No n/v/d.  No CP or SOB.  I was concerned for DVT.  Ultrasound was neg for DVT but did show superficial thrombus of vein in forearm.  I advised Ibuprofen 800 mg three times daily and follow up here today.  Her symptoms have since improved dramatically.  It is not longer red or as large.  Still painful to touch.  She denies any trauma to arm or recent needle sticks/IVs.  She does sleep with her right arm bent and folded behind her head.  Patient Active Problem List  Diagnosis  . HYPOTHYROIDISM  . DIABETES MELLITUS, TYPE II  . HYPERLIPIDEMIA  . BIPOLAR DISORDER UNSPECIFIED  . ANXIETY  . Other chronic pain  . HYPERTENSION  . CORONARY ARTERY DISEASE  . LOW BACK PAIN  . NEPHROLITHIASIS, HX OF  . Gastroparesis due to DM  . Personal history of colonic polyps  . DDD (degenerative disc disease), lumbar  . Mass of arm  . Superficial phlebitis of arm   Past Medical History  Diagnosis Date  . CAD (coronary artery disease)     multiple caths  . Diabetes mellitus   . Hyperlipidemia   . Hypertension   . Thyroid disease     hypothyroid  . Low back pain   . Nephrolithiasis   . Fibromyalgia   . Bipolar disorder   . Anxiety    Past Surgical History  Procedure Laterality Date  . Vaginal hysterectomy    . Tonsillectomy    . Colonoscopy  multiple    2005 [place] records not available-polyps x 3   History  Substance Use Topics  . Smoking status: Never Smoker   . Smokeless tobacco: Never Used  . Alcohol Use: No   Family History  Problem Relation Age of Onset  . Hypertension Mother   . Diabetes Mother   . Bipolar disorder Father   . Colon cancer Paternal Aunt   . Colon  cancer Paternal Grandmother    Allergies  Allergen Reactions  . Byetta 10 Mcg Pen (Exenatide) Nausea And Vomiting  . Quetiapine    Current Outpatient Prescriptions on File Prior to Visit  Medication Sig Dispense Refill  . ARIPiprazole (ABILIFY PO)       . aspirin 81 MG tablet Take 81 mg by mouth daily.      . B-D ULTRAFINE III SHORT PEN 31G X 8 MM MISC USE AS DIRECTED  100 each  5  . BAYER MICROLET LANCETS lancets Use to check blood sugar as directed.  100 each  12  . Blood Glucose Monitoring Suppl (BAYER BREEZE 2 SYSTEM) W/DEVICE KIT Use as directed to check blood sugar       . COLCRYS 0.6 MG tablet TAKE 1 TABLET BY MOUTH EVERY DAY  90 tablet  1  . CRESTOR 20 MG tablet TAKE ONE TABLET BY MOUTH DAILY  90 tablet  1  . divalproex (DEPAKOTE ER) 500 MG 24 hr tablet TAKE 1 TABLET BY MOUTH TWICE DAILY  60 tablet  0  . DULoxetine (CYMBALTA) 60 MG capsule TAKE ONE CAPSULE BY MOUTH ONCE DAILY  90 capsule  0  . gabapentin (NEURONTIN) 100 MG capsule Take 2 capsules (200 mg total) by mouth 3 (three) times daily.  180 capsule  3  . glipiZIDE-metformin (METAGLIP) 2.5-500 MG per tablet TAKE 2 TABLETS BY MOUTH TWICE DAILY BEFORE A MEAL  360 tablet  1  . Glucose Blood (BAYER BREEZE 2 TEST) DISK Use to check blood sugar up to 3 times a day  100 each  5  . ibuprofen (ADVIL,MOTRIN) 800 MG tablet 1 tab by mouth three times daily until resolution (at least until her appointment on 4/28).  30 tablet  0  . Insulin Pen Needle 31G X 5 MM MISC Use to check blood sugar as directed  100 each  12  . Iodoquinol-HC (VYTONE) 1 % CREA Apply thin layer to area bid   1 Tube  3  . LANTUS SOLOSTAR 100 UNIT/ML injection INJECT 46 UNITS UNDER THE SKIN DAILY  45 mL  5  . levothyroxine (SYNTHROID, LEVOTHROID) 175 MCG tablet Take 1 tablet (175 mcg total) by mouth daily.  30 tablet  5  . Liraglutide (VICTOZA) 18 MG/3ML SOLN injection Inject 1.2 mg every day  6 mL  5  . lisinopril (PRINIVIL,ZESTRIL) 5 MG tablet TAKE 1 TABLET BY  MOUTH DAILY  90 tablet  1  . Multiple Vitamin (MULTIVITAMIN) capsule Take 1 capsule by mouth daily.        . Omega-3 Fatty Acids (FISH OIL) 1000 MG CAPS Take one by mouth daily      . Oxycodone HCl 10 MG TABS 1 tab by mouth every eight hours as needed for pain.  90 tablet  0  . pantoprazole (PROTONIX) 40 MG tablet TAKE ONE TABLET BY MOUTH DAILY  90 tablet  1  . TAZTIA XT 180 MG 24 hr capsule TAKE ONE CAPSULE BY MOUTH EVERY DAY  90 capsule  0  . traMADol (ULTRAM) 50 MG tablet TAKE 2 TABLETS BY MOUTH 4 TIMES A DAY  240 tablet  0   No current facility-administered medications on file prior to visit.   The PMH, PSH, Social History, Family History, Medications, and allergies have been reviewed in [facility], and have been updated if relevant.   Review of Systems See HPI No CP No SOB    Objective:   Physical Exam  BP 130/80  Pulse 68  Temp(Src) 97.7 F (36.5 C)  Constitutional: She appears well-developed. No distress.  HENT:  Head: Normocephalic.  Right arm: Subcutaneous mass in inner forearm much smaller, still TTP, no warmth with very minimal erythema. Psychiatric: Her mood appears not anxious. She does not exhibit a depressed mood.     Assessment & Plan:   1. Superficial phlebitis of arm Improving with NSAIDs, continue for next 4-5 days.  Advised sleeping with right arm in a less bent position.   Call or return to clinic prn if these symptoms worsen or fail to improve as anticipated. The patient indicates understanding of these issues and agrees with the plan.

## 2012-08-16 NOTE — Patient Instructions (Addendum)
Good to see you. Please keep taking your Ibuprofen. Call me by the end of the week if it's not any better.

## 2012-08-23 ENCOUNTER — Other Ambulatory Visit: Payer: Self-pay | Admitting: *Deleted

## 2012-08-23 MED ORDER — TRAMADOL HCL 50 MG PO TABS: ORAL_TABLET | ORAL | Status: DC

## 2012-08-23 MED ORDER — DULOXETINE HCL 60 MG PO CPEP: ORAL_CAPSULE | ORAL | Status: DC

## 2012-08-23 NOTE — Telephone Encounter (Signed)
Last filled 07/20/12 

## 2012-09-01 ENCOUNTER — Other Ambulatory Visit: Payer: Self-pay | Admitting: Family Medicine

## 2012-09-01 NOTE — Telephone Encounter (Signed)
Patient Lori, Phillips,  calling in for refill of Oxycodone 10mg  . Take one tablet by mouth every 8 hours.  Patient states that she comes by office to pick up script.  Please advise when ready. [phone]

## 2012-09-01 NOTE — Telephone Encounter (Signed)
Advised patient that refill has been denied, she is requesting the refill a week early.  She said she will call back on 4/19, which will be 2 days early.

## 2012-09-06 ENCOUNTER — Encounter: Payer: Self-pay | Admitting: Family Medicine

## 2012-09-06 ENCOUNTER — Telehealth: Payer: Self-pay | Admitting: Family Medicine

## 2012-09-06 MED ORDER — OXYCODONE HCL 10 MG PO TABS: ORAL_TABLET | ORAL | Status: DC

## 2012-09-06 NOTE — Telephone Encounter (Signed)
Pt is again requesting refill on oxycodone, she had called last week and we told her request is too early.  She states she's going out of town today and needs to pick this up.

## 2012-09-06 NOTE — Telephone Encounter (Signed)
Advised patient script is ready to pick up, placed at front desk. 

## 2012-09-06 NOTE — Telephone Encounter (Signed)
Pt called back; still has med for 1-2 days but pt is going out of town later today and pt wants to pick up rx today.Please advise.

## 2012-09-06 NOTE — Telephone Encounter (Signed)
Caller: Roquel/Patient; Phone: [phone]; Reason for Call: Pt requesting new Rx for Oxycodone 10 mg tablet.  Pt states she will come by to pick up written Rx.  PLEASE F/U WITH PT WHEN RX IS READY, THANK YOU.

## 2012-09-20 ENCOUNTER — Other Ambulatory Visit: Payer: Self-pay | Admitting: Family Medicine

## 2012-09-28 ENCOUNTER — Other Ambulatory Visit: Payer: Self-pay | Admitting: *Deleted

## 2012-09-28 ENCOUNTER — Other Ambulatory Visit: Payer: Self-pay | Admitting: Family Medicine

## 2012-09-28 MED ORDER — TRAMADOL HCL 50 MG PO TABS: ORAL_TABLET | ORAL | Status: DC

## 2012-09-28 NOTE — Telephone Encounter (Signed)
Opened in error

## 2012-09-28 NOTE — Telephone Encounter (Signed)
Pt left v/m requesting refill on Tramadol; pt is sitting in parking lot at [employer]; pt does not have gas to go back and forth. Pt left no contact #.

## 2012-09-28 NOTE — Telephone Encounter (Signed)
Spoke with patient, advised her that we just got the message below, requesting a refill, a few minutes ago and advised her that you are out of the office this afternoon.  She states that she called her drug store 2 hours ago for refill.  I then advised her that we do ask for 24 hour notice on refills, preferably 48, but at least 24, since the doctors are not always available to approve the refills.  Pt states understanding and I suggested that the check with her drug store later on this evening, and if refill isn't there it will be sent in tomorrow morning.

## 2012-09-28 NOTE — Telephone Encounter (Signed)
Patient calling in requesting a refill of Tramadol.  She states that the pharmacy faxed a request over an hour ago and she is in town and would like to pick up the medication. She does not have the gas to drive back and forth. Advised caller that I would forward her request and concerns; However, the office and staff are currently seeing patients.  Understanding expressed. Please contact patient when medication is filled.  [phone]

## 2012-10-01 ENCOUNTER — Ambulatory Visit (INDEPENDENT_AMBULATORY_CARE_PROVIDER_SITE_OTHER)
Admission: RE | Admit: 2012-10-01 | Discharge: 2012-10-01 | Disposition: A | Discharge status: Home / Self Care | Payer: Medicare Other | Source: Ambulatory Visit | Attending: Family Medicine | Admitting: Family Medicine

## 2012-10-01 ENCOUNTER — Ambulatory Visit (INDEPENDENT_AMBULATORY_CARE_PROVIDER_SITE_OTHER): Payer: Medicare Other | Admitting: Family Medicine

## 2012-10-01 ENCOUNTER — Encounter: Payer: Self-pay | Admitting: Family Medicine

## 2012-10-01 VITALS — BP 120/74 | HR 93 | Temp 98.2°F | Ht 66.0 in | Wt 200.0 lb

## 2012-10-01 DIAGNOSIS — M25569 Pain in unspecified knee: Secondary | ICD-10-CM

## 2012-10-01 DIAGNOSIS — M25562 Pain in left knee: Secondary | ICD-10-CM

## 2012-10-01 MED ORDER — MELOXICAM 15 MG PO TABS: 15.0000 mg | ORAL_TABLET | Freq: Every day | ORAL | Status: DC

## 2012-10-01 NOTE — Progress Notes (Signed)
Nature conservation officer at [facility] [address] [place] [address] Phone: [phone] Fax: [phone]  Date:  10/01/2012   Name:  Lori Phillips   DOB:  April 22, 1955   MRN:  [id number] Gender: female Age: 57 y.o.  Primary Physician:  [name], MD  Evaluating MD: Hannah Beat, MD   Chief Complaint: Knee Pain   History of Present Illness:  Lori Phillips is a 57 y.o. pleasant patient who presents with the following:  Hurt her knee about thirteen years ago. Never went to the doctor. Knee has been giving her problems, and rides in a buggy. Knee went pop and heard and felt something. Using cane now. Now she is having L knee pain, using a cane, but is not having an effusion. No significant locking up, but she is having some occaisional giving way over her knee.  Here with daughter. +/- compliant with psych meds, but she has been more forgetful and out of since starting Klonopin.   Patient Active Problem List   Diagnosis Date Noted  . Superficial phlebitis of arm 08/16/2012  . Mass of arm 03/23/2012  . DDD (degenerative disc disease), lumbar 11/24/2011  . Personal history of colonic polyps 10/03/2011  . Gastroparesis due to DM 10/01/2011  . Other chronic pain 02/21/2010  . HYPOTHYROIDISM 11/26/2009  . DIABETES MELLITUS, TYPE II 11/26/2009  . HYPERLIPIDEMIA 11/26/2009  . BIPOLAR DISORDER UNSPECIFIED 11/26/2009  . ANXIETY 11/26/2009  . HYPERTENSION 11/26/2009  . CORONARY ARTERY DISEASE 11/26/2009  . LOW BACK PAIN 11/26/2009  . NEPHROLITHIASIS, HX OF 11/26/2009    Past Medical History  Diagnosis Date  . CAD (coronary artery disease)     multiple caths  . Diabetes mellitus   . Hyperlipidemia   . Hypertension   . Thyroid disease     hypothyroid  . Low back pain   . Nephrolithiasis   . Fibromyalgia   . Bipolar disorder   . Anxiety     Past Surgical History  Procedure Laterality Date  . Vaginal hysterectomy    . Tonsillectomy    . Colonoscopy  multiple   2005 [place] records not available-polyps x 3    History   Social History  . Marital Status: Divorced    Spouse Name: N/A    Number of Children: 3  . Years of Education: N/A   Occupational History  . disability    .     Social History Main Topics  . Smoking status: Never Smoker   . Smokeless tobacco: Never Used  . Alcohol Use: No  . Drug Use: No  . Sexually Active: Not on file   Other Topics Concern  . Not on file   Social History Narrative  . No narrative on file    Family History  Problem Relation Age of Onset  . Hypertension Mother   . Diabetes Mother   . Bipolar disorder Father   . Colon cancer Paternal Aunt   . Colon cancer Paternal Grandmother     Allergies  Allergen Reactions  . Byetta 10 Mcg Pen (Exenatide) Nausea And Vomiting  . Quetiapine     Medication list has been reviewed and updated.  Outpatient Prescriptions Prior to Visit  Medication Sig Dispense Refill  . aspirin 81 MG tablet Take 81 mg by mouth daily.      . B-D ULTRAFINE III SHORT PEN 31G X 8 MM MISC USE AS DIRECTED  100 each  5  . BAYER MICROLET LANCETS lancets Use to  check blood sugar as directed.  100 each  12  . Blood Glucose Monitoring Suppl (BAYER BREEZE 2 SYSTEM) W/DEVICE KIT Use as directed to check blood sugar       . CRESTOR 20 MG tablet TAKE ONE TABLET BY MOUTH DAILY  90 tablet  1  . divalproex (DEPAKOTE ER) 500 MG 24 hr tablet TAKE 1 TABLET BY MOUTH TWICE DAILY  60 tablet  0  . DULoxetine (CYMBALTA) 60 MG capsule TAKE ONE CAPSULE BY MOUTH ONCE DAILY  90 capsule  0  . gabapentin (NEURONTIN) 100 MG capsule Take 2 capsules (200 mg total) by mouth 3 (three) times daily.  180 capsule  3  . glipiZIDE-metformin (METAGLIP) 2.5-500 MG per tablet TAKE 2 TABLETS BY MOUTH TWICE DAILY BEFORE A MEAL  360 tablet  1  . Glucose Blood (BAYER BREEZE 2 TEST) DISK Use to check blood sugar up to 3 times a day  100 each  5  . ibuprofen (ADVIL,MOTRIN) 800 MG tablet 1 tab by mouth three times daily  until resolution (at least until her appointment on 4/28).  30 tablet  0  . Insulin Pen Needle 31G X 5 MM MISC Use to check blood sugar as directed  100 each  12  . LANTUS SOLOSTAR 100 UNIT/ML injection INJECT 46 UNITS UNDER THE SKIN DAILY  45 mL  5  . levothyroxine (SYNTHROID, LEVOTHROID) 175 MCG tablet Take 1 tablet (175 mcg total) by mouth daily.  30 tablet  5  . Liraglutide (VICTOZA) 18 MG/3ML SOLN injection Inject 1.2 mg every day  6 mL  5  . lisinopril (PRINIVIL,ZESTRIL) 5 MG tablet TAKE 1 TABLET BY MOUTH EVERY DAY  90 tablet  1  . Omega-3 Fatty Acids (FISH OIL) 1000 MG CAPS Take one by mouth daily      . Oxycodone HCl 10 MG TABS 1 tab by mouth every eight hours as needed for pain.  90 tablet  0  . pantoprazole (PROTONIX) 40 MG tablet TAKE 1 TABLET BY MOUTH EVERY DAY  90 tablet  1  . TAZTIA XT 180 MG 24 hr capsule TAKE ONE CAPSULE BY MOUTH EVERY DAY  90 capsule  0  . traMADol (ULTRAM) 50 MG tablet TAKE 2 TABLETS BY MOUTH 4 TIMES A DAY  240 tablet  0  . ARIPiprazole (ABILIFY PO)       . COLCRYS 0.6 MG tablet TAKE 1 TABLET BY MOUTH EVERY DAY  90 tablet  1  . Iodoquinol-HC (VYTONE) 1 % CREA Apply thin layer to area bid   1 Tube  3  . Multiple Vitamin (MULTIVITAMIN) capsule Take 1 capsule by mouth daily.         No facility-administered medications prior to visit.    Review of Systems:   GEN: No fevers, chills. Nontoxic. Primarily MSK c/o today. MSK: Detailed in the HPI GI: tolerating PO intake without difficulty Neuro: No numbness, parasthesias, or tingling associated. Otherwise the pertinent positives of the ROS are noted above.    Physical Examination: BP 120/74  Pulse 93  Temp(Src) 98.2 F (36.8 C) (Oral)  Ht 5\' 6"  (1.676 m)  Wt 200 lb (90.719 kg)  BMI 32.3 kg/m2  SpO2 95%  Ideal Body Weight: Weight in (lb) to have BMI = 25: 154.6   GEN: WDWN, NAD, Non-toxic, Alert & Oriented x 3 HEENT: Atraumatic, Normocephalic.  Ears and Nose: No external deformity. EXTR: No  clubbing/cyanosis/edema NEURO: moderately antalgic gait.  PSYCH: Normally interactive. Conversant. Not depressed or  anxious appearing.  Calm demeanor.   L knee. Full ext. Flexion to 120. NT tibia and fibula. NT along patella. Facets nt. ACL, PCL, MCL, and LCL are NT and stable. Some pain with mcmurrays. Bounce home neg.  Dg Knee Ap/lat W/sunrise Left  10/01/2012   *RADIOLOGY REPORT*  Clinical Data: History of acute knee pain.  Popping sound 2 days previously.  DG KNEE - 3 VIEWS  Comparison: None.  Findings: No definite joint effusion is seen.  There is minimal beginning spurring of the posterior aspect of the patella.  There is narrowing of the medial knee joint space with marginal osteophyte formation.  No fracture or bony destruction is seen.  No chondrocalcinosis or opaque loose body is seen. No erosive changes are seen.  IMPRESSION: Minimal beginning spurring of posterior aspect of patella. Narrowing of medial joint space with marginal osteophyte formation consistent with degenerative joint changes.   Original Report Authenticated By: Onalee Hua Call    Assessment and Plan:  Left knee pain - Plan: DG Knee AP/LAT W/Sunrise Left  Likely oa exacerbation, cannot exclude medial degenerative meniscal pathology. Conservative in this case. Placed in a hinged patellar J brace. Cont to use cane.   Mobic.  With manic-depressive illness, ? Compliance with meds, I favor more conservative approach here.   Orders Today:  Orders Placed This Encounter  Procedures  . DG Knee AP/LAT W/Sunrise Left    Standing Status: Future     Number of Occurrences: 1     Standing Expiration Date: 12/01/2013    Order Specific Question:  Reason for exam:    Answer:  acute knee pain    Order Specific Question:  Is the patient pregnant?    Answer:  No    Order Specific Question:  Preferred imaging location?    Answer:  [facility]    Updated Medication List: (Includes new medications, updates to list, dose  adjustments) Meds ordered this encounter  Medications  . ABILIFY 10 MG tablet    Sig: Take 1 tablet by mouth once.  . clonazePAM (KLONOPIN) 0.5 MG tablet    Sig: Take 1 tablet by mouth once.  . meloxicam (MOBIC) 15 MG tablet    Sig: Take 1 tablet (15 mg total) by mouth daily.    Dispense:  30 tablet    Refill:  2    Medications Discontinued: Medications Discontinued During This Encounter  Medication Reason  . ARIPiprazole (ABILIFY PO) Error  . COLCRYS 0.6 MG tablet Error  . Iodoquinol-HC (VYTONE) 1 % CREA Error  . Multiple Vitamin (MULTIVITAMIN) capsule Error      Signed, Chantell Kunkler T. Shakila Mak, MD 10/01/2012 10:37 AM

## 2012-10-06 ENCOUNTER — Other Ambulatory Visit: Payer: Self-pay

## 2012-10-06 ENCOUNTER — Encounter: Payer: Self-pay | Admitting: Family Medicine

## 2012-10-06 MED ORDER — OXYCODONE HCL 10 MG PO TABS: ORAL_TABLET | ORAL | Status: DC

## 2012-10-06 NOTE — Telephone Encounter (Signed)
Pt left v/m requesting rx oxycodone. Call when ready for pick up. 

## 2012-10-06 NOTE — Telephone Encounter (Signed)
Advised patient script is ready for pick up at the front desk.

## 2012-10-18 ENCOUNTER — Encounter: Payer: Self-pay | Admitting: Family Medicine

## 2012-10-25 ENCOUNTER — Other Ambulatory Visit: Payer: Self-pay | Admitting: *Deleted

## 2012-10-25 MED ORDER — GABAPENTIN 100 MG PO CAPS: 200.0000 mg | ORAL_CAPSULE | Freq: Three times a day (TID) | ORAL | Status: DC

## 2012-10-25 MED ORDER — TRAMADOL HCL 50 MG PO TABS: ORAL_TABLET | ORAL | Status: DC

## 2012-11-04 ENCOUNTER — Telehealth: Payer: Self-pay | Admitting: Family Medicine

## 2012-11-04 ENCOUNTER — Other Ambulatory Visit: Payer: Self-pay

## 2012-11-04 MED ORDER — OXYCODONE HCL 10 MG PO TABS: ORAL_TABLET | ORAL | Status: DC

## 2012-11-04 NOTE — Telephone Encounter (Signed)
Pt left v/m requesting rx oxycodone. Call when ready for pick up. 

## 2012-11-04 NOTE — Telephone Encounter (Signed)
Carman Ching dropped off [employer] Forms for Dr. Dayton Martes to complete. The best number to reach Shamir is [phone].  Thanks, Revonda Standard

## 2012-11-04 NOTE — Telephone Encounter (Signed)
Letter is on your desk, for your review. There is not a form for you to complete.

## 2012-11-04 NOTE — Telephone Encounter (Signed)
See note below

## 2012-11-04 NOTE — Telephone Encounter (Signed)
Advised patient script is ready for pick up, script placed at front desk. 

## 2012-11-22 ENCOUNTER — Other Ambulatory Visit: Payer: Self-pay | Admitting: Family Medicine

## 2012-11-22 NOTE — Telephone Encounter (Signed)
Refill called to [facility]. 

## 2012-11-23 ENCOUNTER — Other Ambulatory Visit: Payer: Self-pay | Admitting: *Deleted

## 2012-11-23 MED ORDER — DULOXETINE HCL 60 MG PO CPEP: ORAL_CAPSULE | ORAL | Status: DC

## 2012-11-23 MED ORDER — DILTIAZEM HCL ER BEADS 180 MG PO CP24: ORAL_CAPSULE | ORAL | Status: DC

## 2012-12-03 ENCOUNTER — Other Ambulatory Visit: Payer: Self-pay

## 2012-12-03 MED ORDER — OXYCODONE HCL 10 MG PO TABS: ORAL_TABLET | ORAL | Status: DC

## 2012-12-03 NOTE — Telephone Encounter (Signed)
Pt left v/m requesting rx oxycodone. Call when ready for pick up. Pt going out of town and returning 12/11/12 and pt wants refilled before goes out of town. Pt did not leave when she will be going out of town and no answer at contact #.

## 2012-12-03 NOTE — Telephone Encounter (Signed)
Pt called back and she is going out of town this evening. Pt request to pick up prescription today.

## 2012-12-03 NOTE — Telephone Encounter (Signed)
Pt informed RX ready for pick up at front desk.

## 2012-12-20 ENCOUNTER — Other Ambulatory Visit: Payer: Self-pay | Admitting: Family Medicine

## 2012-12-21 NOTE — Telephone Encounter (Signed)
Refill called to [facility]. 

## 2012-12-31 ENCOUNTER — Other Ambulatory Visit: Payer: Self-pay | Admitting: *Deleted

## 2012-12-31 MED ORDER — GLIPIZIDE-METFORMIN HCL 2.5-500 MG PO TABS: ORAL_TABLET | ORAL | Status: DC

## 2013-01-03 ENCOUNTER — Other Ambulatory Visit: Payer: Self-pay

## 2013-01-03 MED ORDER — OXYCODONE HCL 10 MG PO TABS: ORAL_TABLET | ORAL | Status: DC

## 2013-01-03 NOTE — Telephone Encounter (Signed)
Advised patient script is ready for pick up at front desk.

## 2013-01-03 NOTE — Telephone Encounter (Signed)
Pt left v/m requesting rx oxycodone. Call when ready for pick up. 

## 2013-01-11 ENCOUNTER — Other Ambulatory Visit: Payer: Self-pay | Admitting: *Deleted

## 2013-01-11 MED ORDER — DULOXETINE HCL 60 MG PO CPEP: ORAL_CAPSULE | ORAL | Status: DC

## 2013-01-18 ENCOUNTER — Other Ambulatory Visit: Payer: Self-pay | Admitting: Family Medicine

## 2013-01-20 ENCOUNTER — Other Ambulatory Visit: Payer: Self-pay | Admitting: Family Medicine

## 2013-01-20 NOTE — Telephone Encounter (Signed)
Called to pharmacy 

## 2013-01-20 NOTE — Telephone Encounter (Signed)
Last OV 10/01/2012 with Dr. Patsy Lager.  Ok to refill?

## 2013-01-21 NOTE — Telephone Encounter (Signed)
Pt wants to know why tramadol was denied; spoke with Romeo Apple pharmacist at [facility]; he did not get call in on 01/20/13. Medication phoned to [facility] as instructed.apologized to pt and pt notified done.

## 2013-01-27 ENCOUNTER — Other Ambulatory Visit: Payer: Self-pay | Admitting: Family Medicine

## 2013-02-02 ENCOUNTER — Other Ambulatory Visit: Payer: Self-pay

## 2013-02-02 MED ORDER — OXYCODONE HCL 10 MG PO TABS: ORAL_TABLET | ORAL | Status: DC

## 2013-02-02 NOTE — Telephone Encounter (Signed)
RX placed up front, pt advised.

## 2013-02-02 NOTE — Telephone Encounter (Signed)
Pt left v/m requesting rx oxycodone. Call when ready for pick up. 

## 2013-02-14 ENCOUNTER — Other Ambulatory Visit: Payer: Self-pay | Admitting: Family Medicine

## 2013-02-18 ENCOUNTER — Other Ambulatory Visit: Payer: Self-pay | Admitting: Family Medicine

## 2013-02-22 ENCOUNTER — Other Ambulatory Visit: Payer: Self-pay | Admitting: Family Medicine

## 2013-02-22 NOTE — Telephone Encounter (Signed)
Last OV for DM & A1c 04/26/2012.  Ok to refill?

## 2013-03-03 ENCOUNTER — Other Ambulatory Visit: Payer: Self-pay

## 2013-03-03 MED ORDER — OXYCODONE HCL 10 MG PO TABS: ORAL_TABLET | ORAL | Status: DC

## 2013-03-03 NOTE — Telephone Encounter (Signed)
Signed and pt ready to be called 

## 2013-03-03 NOTE — Telephone Encounter (Signed)
Pt left v/m requesting rx oxycodone.call when ready for pick up. 

## 2013-03-03 NOTE — Telephone Encounter (Signed)
Pt notified that RX ready to be picked up. 

## 2013-03-06 ENCOUNTER — Other Ambulatory Visit: Payer: Self-pay | Admitting: Family Medicine

## 2013-03-07 NOTE — Telephone Encounter (Signed)
Received refill request electronically. Last office visit 10/01/12/acute. Is it okay to refill?

## 2013-03-14 ENCOUNTER — Other Ambulatory Visit: Payer: Self-pay | Admitting: Family Medicine

## 2013-03-21 ENCOUNTER — Other Ambulatory Visit: Payer: Self-pay | Admitting: Internal Medicine

## 2013-03-21 ENCOUNTER — Other Ambulatory Visit: Payer: Self-pay | Admitting: Family Medicine

## 2013-03-21 NOTE — Telephone Encounter (Signed)
Last office visit 10/01/2012 with Dr. Patsy Lager.  Ok to refill?

## 2013-03-21 NOTE — Telephone Encounter (Signed)
Please call in. Needs to set f/u with PCP.

## 2013-03-22 NOTE — Telephone Encounter (Signed)
Medication phoned to pharmacy.  

## 2013-03-22 NOTE — Telephone Encounter (Signed)
Patient advised to schedule FU with PCP.

## 2013-03-27 ENCOUNTER — Other Ambulatory Visit: Payer: Self-pay | Admitting: Internal Medicine

## 2013-03-27 ENCOUNTER — Other Ambulatory Visit: Payer: Self-pay | Admitting: Family Medicine

## 2013-03-27 DIAGNOSIS — E785 Hyperlipidemia, unspecified: Secondary | ICD-10-CM

## 2013-03-27 NOTE — Telephone Encounter (Signed)
Last office visit 10/01/2012 with Dr. Patsy Lager for knee pain.  Last A1c 04/26/2012. Ok to refill?

## 2013-04-04 ENCOUNTER — Telehealth: Payer: Self-pay

## 2013-04-04 ENCOUNTER — Other Ambulatory Visit: Payer: Self-pay

## 2013-04-04 MED ORDER — OXYCODONE HCL 10 MG PO TABS: ORAL_TABLET | ORAL | Status: DC

## 2013-04-04 NOTE — Telephone Encounter (Signed)
Rx left in front office for pick up and pt is aware  

## 2013-04-04 NOTE — Telephone Encounter (Signed)
Pt request rx oxycodone. Call when ready for pick up. 

## 2013-04-12 ENCOUNTER — Other Ambulatory Visit: Payer: Self-pay | Admitting: Family Medicine

## 2013-04-13 ENCOUNTER — Ambulatory Visit (INDEPENDENT_AMBULATORY_CARE_PROVIDER_SITE_OTHER): Payer: Medicare Other

## 2013-04-13 ENCOUNTER — Other Ambulatory Visit (INDEPENDENT_AMBULATORY_CARE_PROVIDER_SITE_OTHER): Payer: Medicare Other

## 2013-04-13 DIAGNOSIS — E785 Hyperlipidemia, unspecified: Secondary | ICD-10-CM

## 2013-04-13 DIAGNOSIS — Z23 Encounter for immunization: Secondary | ICD-10-CM

## 2013-04-13 LAB — LIPID PANEL
Cholesterol: 119 mg/dL (ref 0–200)
Total CHOL/HDL Ratio: 3
VLDL: 72.8 mg/dL — ABNORMAL HIGH (ref 0.0–40.0)

## 2013-04-13 LAB — LDL CHOLESTEROL, DIRECT: Direct LDL: 47.8 mg/dL

## 2013-04-13 NOTE — Telephone Encounter (Signed)
Pt requesting medication refill. Last f/u ov in 07/2012 with no future appts sched. pls advise

## 2013-04-13 NOTE — Telephone Encounter (Signed)
Rx sent through e-scribe via Nicki Reaper 12/24

## 2013-04-17 ENCOUNTER — Other Ambulatory Visit: Payer: Self-pay | Admitting: Internal Medicine

## 2013-04-18 ENCOUNTER — Encounter: Payer: Self-pay | Admitting: *Deleted

## 2013-04-18 NOTE — Telephone Encounter (Signed)
I saw that this Rx was sent in on 03/21/2013. This pt has not had a recent visit or CPE and last A1C i saw was from 04/2012--please advise

## 2013-04-24 ENCOUNTER — Other Ambulatory Visit: Payer: Self-pay | Admitting: Family Medicine

## 2013-04-25 NOTE — Telephone Encounter (Signed)
Pt left v/m requesting status of tramadol refill Lori Phillips; pt is out of medication.Please advise.

## 2013-04-25 NOTE — Telephone Encounter (Signed)
Spoke to pt and informed her Rx sent to requested pharmacy 

## 2013-04-27 ENCOUNTER — Other Ambulatory Visit: Payer: Self-pay | Admitting: Internal Medicine

## 2013-05-04 ENCOUNTER — Other Ambulatory Visit: Payer: Self-pay

## 2013-05-04 MED ORDER — OXYCODONE HCL 10 MG PO TABS: ORAL_TABLET | ORAL | Status: DC

## 2013-05-04 NOTE — Telephone Encounter (Signed)
Pt left v/m requesting rx oxycodone. Call when ready for pick up. 

## 2013-05-04 NOTE — Telephone Encounter (Signed)
Spoke to pt and informed her that Rx is available for pickup at the front desk; pt informed gov't issued photo id is required for pickup

## 2013-05-13 ENCOUNTER — Other Ambulatory Visit: Payer: Self-pay | Admitting: Family Medicine

## 2013-05-16 NOTE — Telephone Encounter (Signed)
Pt requesting medication refill. Last ov 07/2012 with no future appts scheduled. pls advise 

## 2013-05-17 ENCOUNTER — Other Ambulatory Visit: Payer: Self-pay | Admitting: Family Medicine

## 2013-05-17 ENCOUNTER — Other Ambulatory Visit: Payer: Self-pay | Admitting: Internal Medicine

## 2013-05-18 NOTE — Telephone Encounter (Signed)
Victoza last filled 04/11/13 and Gabapentin last filled 11/14--- Last OV 07/2012--pt last labs and CPE was 04/2012--please advise

## 2013-05-18 NOTE — Telephone Encounter (Signed)
Pt requesting medication refill. Last f/u ov 07/2012 with no future appts scheduled. pls advise

## 2013-05-20 ENCOUNTER — Telehealth: Payer: Self-pay | Admitting: Family Medicine

## 2013-05-20 NOTE — Telephone Encounter (Signed)
Yes ok to change

## 2013-05-20 NOTE — Telephone Encounter (Signed)
Caryl Pina from [facility] called to say that [place] XT 180mg  is out of stock and can they substitute for Cartia XT 180mg ?  Cb [phone]

## 2013-05-20 NOTE — Telephone Encounter (Signed)
Spoke to [place] and informed her ok to change med per Dr Deborra Medina

## 2013-05-23 ENCOUNTER — Other Ambulatory Visit: Payer: Self-pay | Admitting: Family Medicine

## 2013-05-23 NOTE — Telephone Encounter (Signed)
Last office visit 10/01/2012 with Dr. Lorelei Pont.  Last A1c 04/26/2012.  Tramadol last filled 04/24/2013.  Ok to refill?

## 2013-05-24 NOTE — Telephone Encounter (Signed)
Spoke to pt and informed her Rx has been sent to requested pharmacy 

## 2013-06-02 ENCOUNTER — Other Ambulatory Visit: Payer: Self-pay

## 2013-06-02 ENCOUNTER — Encounter: Payer: Self-pay | Admitting: Family Medicine

## 2013-06-02 MED ORDER — OXYCODONE HCL 10 MG PO TABS: ORAL_TABLET | ORAL | Status: DC

## 2013-06-02 NOTE — Telephone Encounter (Signed)
Pt left v/m requesting rx oxycodone. Call when ready for pick up. 

## 2013-06-02 NOTE — Telephone Encounter (Signed)
Spoke to pt and informed her Rx is at the front desk and available for pickup; informed a gov't issued photo id required for pickup 

## 2013-06-03 ENCOUNTER — Other Ambulatory Visit: Payer: Self-pay | Admitting: Family Medicine

## 2013-06-03 DIAGNOSIS — Z1231 Encounter for screening mammogram for malignant neoplasm of breast: Secondary | ICD-10-CM

## 2013-06-03 MED ORDER — OXYCODONE HCL 10 MG PO TABS: ORAL_TABLET | ORAL | Status: DC

## 2013-06-03 NOTE — Addendum Note (Signed)
Addended by: Modena Nunnery on: 06/03/2013 10:04 AM   Modules accepted: Orders

## 2013-06-06 ENCOUNTER — Other Ambulatory Visit: Payer: Self-pay | Admitting: Family Medicine

## 2013-06-08 ENCOUNTER — Ambulatory Visit (HOSPITAL_COMMUNITY): Payer: Medicare Other

## 2013-06-15 ENCOUNTER — Ambulatory Visit (HOSPITAL_COMMUNITY): Payer: Medicare Other

## 2013-06-16 ENCOUNTER — Other Ambulatory Visit: Payer: Self-pay | Admitting: Family Medicine

## 2013-06-17 NOTE — Telephone Encounter (Signed)
Pt requesting medication refill. Last ov 07/2012 with no future appts scheduled. pls advise 

## 2013-06-22 ENCOUNTER — Ambulatory Visit (HOSPITAL_COMMUNITY): Payer: Medicare Other

## 2013-06-22 ENCOUNTER — Other Ambulatory Visit: Payer: Self-pay | Admitting: Family Medicine

## 2013-06-23 ENCOUNTER — Telehealth: Payer: Self-pay

## 2013-06-23 ENCOUNTER — Other Ambulatory Visit: Payer: Self-pay | Admitting: Internal Medicine

## 2013-06-23 MED ORDER — GLUCOSE BLOOD VI STRP: ORAL_STRIP | Status: DC

## 2013-06-23 MED ORDER — ONETOUCH LANCETS MISC: Status: DC

## 2013-06-23 MED ORDER — ONETOUCH BASIC SYSTEM W/DEVICE KIT: PACK | Status: DC

## 2013-06-23 NOTE — Telephone Encounter (Signed)
Pt requesting medication refill. Last ov 07/2012 with no future appts scheduled. pls advise 

## 2013-06-23 NOTE — Telephone Encounter (Signed)
Pt left v/m and insurance will cover accu chek or one touch meter and supplies, pt said to choose either device. Done.

## 2013-06-23 NOTE — Telephone Encounter (Signed)
Pt states [employer] will be calling us.  Her meter broke and her insurance is requiring she change meters anyways.  She is out of test strips as well.  She needs an Accucheck meter and strips.  Due to Insurance she can only go through [employer] order.  Pt's best # [phone]

## 2013-06-23 NOTE — Telephone Encounter (Signed)
Spoke with pt and she will contact ins co to see if accu chek; aviva, advantage or smartview is preferred meter and diabetic supplies. Pt will cb.

## 2013-06-24 NOTE — Telephone Encounter (Signed)
Lm on pts vm informing her Rx has been called in to requested pharmacy 

## 2013-06-29 ENCOUNTER — Ambulatory Visit (HOSPITAL_COMMUNITY)
Admission: RE | Admit: 2013-06-29 | Discharge: 2013-06-29 | Disposition: A | Discharge status: Home / Self Care | Payer: Medicare Other | Source: Ambulatory Visit | Attending: Family Medicine | Admitting: Family Medicine

## 2013-06-29 DIAGNOSIS — Z1231 Encounter for screening mammogram for malignant neoplasm of breast: Secondary | ICD-10-CM | POA: Insufficient documentation

## 2013-06-30 ENCOUNTER — Other Ambulatory Visit: Payer: Self-pay

## 2013-06-30 MED ORDER — OXYCODONE HCL 10 MG PO TABS: ORAL_TABLET | ORAL | Status: DC

## 2013-06-30 NOTE — Telephone Encounter (Signed)
Pt left v/m requesting rx oxycodone. Call when ready for pick up. 

## 2013-06-30 NOTE — Telephone Encounter (Signed)
Spoke to pt and informed her Rx is available at the front desk for pickup; informed OV needed for additional refills and gov't issued photo id required for pickup

## 2013-07-01 ENCOUNTER — Encounter: Payer: Self-pay | Admitting: Family Medicine

## 2013-07-04 ENCOUNTER — Telehealth: Payer: Self-pay | Admitting: *Deleted

## 2013-07-04 NOTE — Telephone Encounter (Signed)
Received a fax request from medical supply company regarding DM testing supplies for pt. I need to verify if pt request these supplies and how often they test bs. I called pt, and left message on voicemail for pt to return call.

## 2013-07-05 ENCOUNTER — Ambulatory Visit (INDEPENDENT_AMBULATORY_CARE_PROVIDER_SITE_OTHER): Payer: Medicare Other | Admitting: Family Medicine

## 2013-07-05 ENCOUNTER — Encounter: Payer: Self-pay | Admitting: Family Medicine

## 2013-07-05 VITALS — BP 118/70 | HR 66 | Temp 97.9°F | Ht 64.5 in | Wt 191.0 lb

## 2013-07-05 DIAGNOSIS — E119 Type 2 diabetes mellitus without complications: Secondary | ICD-10-CM

## 2013-07-05 DIAGNOSIS — E785 Hyperlipidemia, unspecified: Secondary | ICD-10-CM

## 2013-07-05 DIAGNOSIS — I10 Essential (primary) hypertension: Secondary | ICD-10-CM

## 2013-07-05 DIAGNOSIS — E039 Hypothyroidism, unspecified: Secondary | ICD-10-CM

## 2013-07-05 LAB — COMPREHENSIVE METABOLIC PANEL
ALBUMIN: 3.9 g/dL (ref 3.5–5.2)
ALK PHOS: 79 U/L (ref 39–117)
ALT: 12 U/L (ref 0–35)
AST: 15 U/L (ref 0–37)
BUN: 10 mg/dL (ref 6–23)
CO2: 29 meq/L (ref 19–32)
Calcium: 9.6 mg/dL (ref 8.4–10.5)
Chloride: 99 mEq/L (ref 96–112)
Creatinine, Ser: 1 mg/dL (ref 0.4–1.2)
GFR: 58.08 mL/min — ABNORMAL LOW (ref >60.00)
GLUCOSE: 163 mg/dL — AB (ref 70–99)
POTASSIUM: 4.6 meq/L (ref 3.5–5.1)
SODIUM: 138 meq/L (ref 135–145)
TOTAL PROTEIN: 7.3 g/dL (ref 6.0–8.3)
Total Bilirubin: 0.3 mg/dL (ref 0.3–1.2)

## 2013-07-05 LAB — T4, FREE: Free T4: 0.76 ng/dL (ref 0.60–1.60)

## 2013-07-05 LAB — TSH: TSH: 0.34 u[IU]/mL — AB (ref 0.35–5.50)

## 2013-07-05 LAB — LIPID PANEL
CHOL/HDL RATIO: 5
Cholesterol: 250 mg/dL — ABNORMAL HIGH (ref 0–200)
HDL: 46.2 mg/dL (ref >39.00)
LDL Cholesterol: 80 mg/dL (ref 0–99)
Triglycerides: 618 mg/dL — ABNORMAL HIGH (ref 0.0–149.0)
VLDL: 123.6 mg/dL — AB (ref 0.0–40.0)

## 2013-07-05 LAB — HEMOGLOBIN A1C: Hgb A1c MFr Bld: 8.7 % — ABNORMAL HIGH (ref 4.6–6.5)

## 2013-07-05 MED ORDER — INSULIN PEN NEEDLE 31G X 5 MM MISC: Status: DC

## 2013-07-05 NOTE — Patient Instructions (Addendum)
Good to see you. We will call you with your lab results and you can view them online. 

## 2013-07-05 NOTE — Assessment & Plan Note (Signed)
Well controlled. No changes. 

## 2013-07-05 NOTE — Assessment & Plan Note (Signed)
Poorly controlled when last checked. Recheck labs today.

## 2013-07-05 NOTE — Telephone Encounter (Signed)
Patient called back and stated that since she has had so much problems with getting the supplies from the medical supply company she has decided to get them from [facility]. Patient requested that a script for accu chek meter and test strips be sent to the pharmacy.

## 2013-07-05 NOTE — Progress Notes (Signed)
Pre visit review using our clinic review tool, if applicable. No additional management support is needed unless otherwise documented below in the visit note. 

## 2013-07-05 NOTE — Addendum Note (Signed)
Addended by: Modena Nunnery on: 07/05/2013 11:12 AM   Modules accepted: Orders

## 2013-07-05 NOTE — Progress Notes (Signed)
58 yo here for med refills and is overdue for routine preventative care.   DM- does not check her CBGs regulary.   Denies any recent episodes of hypoglycemic symptoms.  Recent a1cs show poor control, refused follow up.  Still taking Lantus 48 units at night, victoza and metaglip.  Lab Results  Component Value Date   HGBA1C 8.2* 04/26/2012   Neuropathy- better with Gabapentin, takes 200 mg three times daily . Hypothyroidism- on synthroid 175 mcg daily.  Denies any symptoms of hypo or hyperthyroidism.  Lab Results  Component Value Date   TSH 0.28* 04/26/2012   Patient Active Problem List   Diagnosis Date Noted  . DDD (degenerative disc disease), lumbar 11/24/2011  . Personal history of colonic polyps 10/03/2011  . Gastroparesis due to DM 10/01/2011  . Other chronic pain 02/21/2010  . HYPOTHYROIDISM 11/26/2009  . DIABETES MELLITUS, TYPE II 11/26/2009  . HYPERLIPIDEMIA 11/26/2009  . BIPOLAR DISORDER UNSPECIFIED 11/26/2009  . ANXIETY 11/26/2009  . HYPERTENSION 11/26/2009  . CORONARY ARTERY DISEASE 11/26/2009  . LOW BACK PAIN 11/26/2009  . NEPHROLITHIASIS, HX OF 11/26/2009   Past Medical History  Diagnosis Date  . CAD (coronary artery disease)     multiple caths  . Diabetes mellitus   . Hyperlipidemia   . Hypertension   . Thyroid disease     hypothyroid  . Low back pain   . Nephrolithiasis   . Fibromyalgia   . Bipolar disorder   . Anxiety    Past Surgical History  Procedure Laterality Date  . Vaginal hysterectomy    . Tonsillectomy    . Colonoscopy  multiple    2005 [place] records not available-polyps x 3   History  Substance Use Topics  . Smoking status: Never Smoker   . Smokeless tobacco: Never Used  . Alcohol Use: No   Family History  Problem Relation Age of Onset  . Hypertension Mother   . Diabetes Mother   . Bipolar disorder Father   . Colon cancer Paternal Aunt   . Colon cancer Paternal Grandmother    Allergies  Allergen Reactions  . Byetta 10  Mcg Pen [Exenatide] Nausea And Vomiting  . Quetiapine    Current Outpatient Prescriptions on File Prior to Visit  Medication Sig Dispense Refill  . ABILIFY 10 MG tablet Take 1 tablet by mouth once.      Marland Kitchen aspirin 81 MG tablet Take 81 mg by mouth daily.      . B-D ULTRAFINE III SHORT PEN 31G X 8 MM MISC USE AS DIRECTED  100 each  5  . Blood Glucose Monitoring Suppl (ONE TOUCH BASIC SYSTEM) W/DEVICE KIT Check blood sugar three times a day and as directed. Dx 250.00  1 each  0  . clonazePAM (KLONOPIN) 0.5 MG tablet Take 1 tablet by mouth once.      . CRESTOR 20 MG tablet TAKE 1 TABLET BY MOUTH EVERY DAY  90 tablet  0  . divalproex (DEPAKOTE ER) 500 MG 24 hr tablet TAKE 1 TABLET BY MOUTH TWICE DAILY  60 tablet  0  . DULoxetine (CYMBALTA) 60 MG capsule TAKE 1 CAPSULE BY MOUTH ONCE DAILY  90 capsule  3  . gabapentin (NEURONTIN) 100 MG capsule TAKE 2 CAPSULES BY MOUTH THREE TIMES DAILY.  180 capsule  0  . glipiZIDE-metformin (METAGLIP) 2.5-500 MG per tablet TAKE 2 TABLETS BY MOUTH TWICE DAILY BEFORE MEALS  360 tablet  0  . glucose blood (ONE TOUCH TEST STRIPS) test strip  Check blood sugar three times daily and as directed. Dx 250.00  270 each  3  . ibuprofen (ADVIL,MOTRIN) 800 MG tablet 1 tab by mouth three times daily until resolution (at least until her appointment on 4/28).  30 tablet  0  . LANTUS SOLOSTAR 100 UNIT/ML SOPN INJECT 46 UNITS UNDER THE SKIN DAILY  45 mL  0  . levothyroxine (SYNTHROID, LEVOTHROID) 175 MCG tablet TAKE 1 TABLET BY MOUTH EVERY DAY  90 tablet  0  . lisinopril (PRINIVIL,ZESTRIL) 5 MG tablet TAKE 1 TABLET BY MOUTH EVERY DAY  30 tablet  1  . Omega-3 Fatty Acids (FISH OIL) 1000 MG CAPS Take one by mouth daily      . ONE TOUCH LANCETS MISC Check blood sugar three times daily and as directed.Dx 250.00  270 each  3  . Oxycodone HCl 10 MG TABS 1 tab by mouth every eight hours as needed for pain.  90 tablet  0  . pantoprazole (PROTONIX) 40 MG tablet TAKE 1 TABLET BY MOUTH EVERY DAY   90 tablet  0  . TAZTIA XT 180 MG 24 hr capsule TAKE ONE CAPSULE BY MOUTH EVERY DAY  90 capsule  0  . traMADol (ULTRAM) 50 MG tablet TAKE 2 TABLETS BY MOUTH FOUR TIMES DAILY  240 tablet  0  . VICTOZA 18 MG/3ML SOPN INJECT 1.2MG EVERY DAY AS DIRECTED.  6 mL  0   No current facility-administered medications on file prior to visit.   The PMH, PSH, Social History, Family History, Medications, and allergies have been reviewed in [facility], and have been updated if relevant.    Review of Systems       See HPI  No abnormal loose stools No vomiting  Patient Active Problem List   Diagnosis Date Noted  . DDD (degenerative disc disease), lumbar 11/24/2011  . Personal history of colonic polyps 10/03/2011  . Gastroparesis due to DM 10/01/2011  . Other chronic pain 02/21/2010  . HYPOTHYROIDISM 11/26/2009  . DIABETES MELLITUS, TYPE II 11/26/2009  . HYPERLIPIDEMIA 11/26/2009  . BIPOLAR DISORDER UNSPECIFIED 11/26/2009  . ANXIETY 11/26/2009  . HYPERTENSION 11/26/2009  . CORONARY ARTERY DISEASE 11/26/2009  . LOW BACK PAIN 11/26/2009  . NEPHROLITHIASIS, HX OF 11/26/2009   Past Medical History  Diagnosis Date  . CAD (coronary artery disease)     multiple caths  . Diabetes mellitus   . Hyperlipidemia   . Hypertension   . Thyroid disease     hypothyroid  . Low back pain   . Nephrolithiasis   . Fibromyalgia   . Bipolar disorder   . Anxiety    Past Surgical History  Procedure Laterality Date  . Vaginal hysterectomy    . Tonsillectomy    . Colonoscopy  multiple    2005 [place] records not available-polyps x 3   History  Substance Use Topics  . Smoking status: Never Smoker   . Smokeless tobacco: Never Used  . Alcohol Use: No   Family History  Problem Relation Age of Onset  . Hypertension Mother   . Diabetes Mother   . Bipolar disorder Father   . Colon cancer Paternal Aunt   . Colon cancer Paternal Grandmother    Allergies  Allergen Reactions  . Byetta 10 Mcg Pen [Exenatide]  Nausea And Vomiting  . Quetiapine    Current Outpatient Prescriptions on File Prior to Visit  Medication Sig Dispense Refill  . ABILIFY 10 MG tablet Take 1 tablet by mouth once.      Marland Kitchen  aspirin 81 MG tablet Take 81 mg by mouth daily.      . B-D ULTRAFINE III SHORT PEN 31G X 8 MM MISC USE AS DIRECTED  100 each  5  . Blood Glucose Monitoring Suppl (ONE TOUCH BASIC SYSTEM) W/DEVICE KIT Check blood sugar three times a day and as directed. Dx 250.00  1 each  0  . clonazePAM (KLONOPIN) 0.5 MG tablet Take 1 tablet by mouth once.      . CRESTOR 20 MG tablet TAKE 1 TABLET BY MOUTH EVERY DAY  90 tablet  0  . divalproex (DEPAKOTE ER) 500 MG 24 hr tablet TAKE 1 TABLET BY MOUTH TWICE DAILY  60 tablet  0  . DULoxetine (CYMBALTA) 60 MG capsule TAKE 1 CAPSULE BY MOUTH ONCE DAILY  90 capsule  3  . gabapentin (NEURONTIN) 100 MG capsule TAKE 2 CAPSULES BY MOUTH THREE TIMES DAILY.  180 capsule  0  . glipiZIDE-metformin (METAGLIP) 2.5-500 MG per tablet TAKE 2 TABLETS BY MOUTH TWICE DAILY BEFORE MEALS  360 tablet  0  . glucose blood (ONE TOUCH TEST STRIPS) test strip Check blood sugar three times daily and as directed. Dx 250.00  270 each  3  . ibuprofen (ADVIL,MOTRIN) 800 MG tablet 1 tab by mouth three times daily until resolution (at least until her appointment on 4/28).  30 tablet  0  . LANTUS SOLOSTAR 100 UNIT/ML SOPN INJECT 46 UNITS UNDER THE SKIN DAILY  45 mL  0  . levothyroxine (SYNTHROID, LEVOTHROID) 175 MCG tablet TAKE 1 TABLET BY MOUTH EVERY DAY  90 tablet  0  . lisinopril (PRINIVIL,ZESTRIL) 5 MG tablet TAKE 1 TABLET BY MOUTH EVERY DAY  30 tablet  1  . Omega-3 Fatty Acids (FISH OIL) 1000 MG CAPS Take one by mouth daily      . ONE TOUCH LANCETS MISC Check blood sugar three times daily and as directed.Dx 250.00  270 each  3  . Oxycodone HCl 10 MG TABS 1 tab by mouth every eight hours as needed for pain.  90 tablet  0  . pantoprazole (PROTONIX) 40 MG tablet TAKE 1 TABLET BY MOUTH EVERY DAY  90 tablet  0  .  TAZTIA XT 180 MG 24 hr capsule TAKE ONE CAPSULE BY MOUTH EVERY DAY  90 capsule  0  . traMADol (ULTRAM) 50 MG tablet TAKE 2 TABLETS BY MOUTH FOUR TIMES DAILY  240 tablet  0  . VICTOZA 18 MG/3ML SOPN INJECT 1.2MG EVERY DAY AS DIRECTED.  6 mL  0   No current facility-administered medications on file prior to visit.    The PMH, PSH, Social History, Family History, Medications, and allergies have been reviewed in [facility], and have been updated if relevant.  Physical Exam BP 118/70  Pulse 66  Temp(Src) 97.9 F (36.6 C) (Oral)  Ht 5' 4.5" (1.638 m)  Wt 191 lb (86.637 kg)  BMI 32.29 kg/m2  SpO2 99%  General:  alert, well-developed, and well-nourished.   Head:  normocephalic and atraumatic.   Nose:  no external deformity.   Mouth:  Oral mucosa and oropharynx without lesions or exudates.  Teeth in good repair. Lungs:  Normal respiratory effort, chest expands symmetrically. Lungs are clear to auscultation, no crackles or wheezes. Heart:  Normal rate and regular rhythm. S1 and S2 normal without gallop, murmur, click, rub or other extra sounds. Abdomen:  Bowel sounds positive,abdomen soft and non-tender.  Some tightness of paraspinous muscles. Psych:  Cognition and judgment appear intact. Alert  and cooperative with normal attention span and concentration. No apparent delusions, illusions, hallucinations  Diabetic foot exam: Normal inspection No skin breakdown No calluses  Normal DP pulses Normal sensation to light touch and monofilament Nails normal  Assessment and Plan:

## 2013-07-05 NOTE — Assessment & Plan Note (Signed)
Continue current rx. Check labs today. Orders Placed This Encounter  Procedures  . Hemoglobin A1c  . TSH  . T4, Free  . Lipid panel  . Comprehensive metabolic panel  . HM Diabetes Foot Exam

## 2013-07-06 ENCOUNTER — Telehealth: Payer: Self-pay | Admitting: Family Medicine

## 2013-07-06 ENCOUNTER — Other Ambulatory Visit: Payer: Self-pay | Admitting: Family Medicine

## 2013-07-06 DIAGNOSIS — IMO0001 Reserved for inherently not codable concepts without codable children: Secondary | ICD-10-CM

## 2013-07-06 DIAGNOSIS — E1165 Type 2 diabetes mellitus with hyperglycemia: Principal | ICD-10-CM

## 2013-07-06 MED ORDER — ONETOUCH BASIC SYSTEM W/DEVICE KIT: PACK | Status: DC

## 2013-07-06 MED ORDER — ONETOUCH LANCETS MISC: Status: DC

## 2013-07-06 MED ORDER — GLUCOSE BLOOD VI STRP: ORAL_STRIP | Status: DC

## 2013-07-06 NOTE — Telephone Encounter (Signed)
Relevant patient education assigned to patient using Emmi. ° °

## 2013-07-06 NOTE — Telephone Encounter (Signed)
Pt called back and after talking with Dr Diona Browner at 07/05/13 appt pt has decided to get one touch meter, test strips and lancets from local [facility]. Pt has not gotten [facility] thing [facility] and it has been several weeks. Advised pt done.

## 2013-07-06 NOTE — Telephone Encounter (Signed)
Pt was calling concerning her appt with an Endocrinologist. She needed to speak with you.

## 2013-07-07 ENCOUNTER — Other Ambulatory Visit: Payer: Self-pay | Admitting: Internal Medicine

## 2013-07-12 ENCOUNTER — Other Ambulatory Visit: Payer: Self-pay | Admitting: Family Medicine

## 2013-07-13 ENCOUNTER — Telehealth: Payer: Self-pay

## 2013-07-13 ENCOUNTER — Ambulatory Visit (INDEPENDENT_AMBULATORY_CARE_PROVIDER_SITE_OTHER): Payer: Medicare Other | Admitting: Internal Medicine

## 2013-07-13 ENCOUNTER — Encounter: Payer: Self-pay | Admitting: Internal Medicine

## 2013-07-13 VITALS — BP 118/74 | HR 81 | Temp 97.7°F | Resp 12 | Ht 64.5 in | Wt 192.8 lb

## 2013-07-13 DIAGNOSIS — E119 Type 2 diabetes mellitus without complications: Secondary | ICD-10-CM

## 2013-07-13 NOTE — Telephone Encounter (Signed)
Relevant patient education assigned to patient using Emmi. ° °

## 2013-07-13 NOTE — Patient Instructions (Signed)
Please continue Glipizide-Metformin 2.5-500 x 2 twice a day. Continue Victoza 1.8 mg daily. Decrease Lantus to 40 units at bedtime.  Please check sugars 2-3x a day and write them down. Come back in a month with your sugar log.  PATIENT INSTRUCTIONS FOR TYPE 2 DIABETES:  **Please join MyChart!** - see attached instructions about how to join   DIET AND EXERCISE Diet and exercise is an important part of diabetic treatment.  We recommended aerobic exercise in the form of brisk walking (working between 40-60% of maximal aerobic capacity, similar to brisk walking) for 150 minutes per week (such as 30 minutes five days per week) along with 3 times per week performing 'resistance' training (using various gauge rubber tubes with handles) 5-10 exercises involving the major muscle groups (upper body, lower body and core) performing 10-15 repetitions (or near fatigue) each exercise. Start at half the above goal but build slowly to reach the above goals. If limited by weight, joint pain, or disability, we recommend daily walking in a swimming pool with water up to waist to reduce pressure from joints while allow for adequate exercise.    BLOOD GLUCOSES Monitoring your blood glucoses is important for continued management of your diabetes. Please check your blood glucoses 2-4 times a day: fasting, before meals and at bedtime (you can rotate these measurements - e.g. one day check before the 3 meals, the next day check before 2 of the meals and before bedtime, etc.   HYPOGLYCEMIA (low blood sugar) Hypoglycemia is usually a reaction to not eating, exercising, or taking too much insulin/ other diabetes drugs.  Symptoms include tremors, sweating, hunger, confusion, headache, etc. Treat IMMEDIATELY with 15 grams of Carbs:   4 glucose tablets    cup regular juice/soda   2 tablespoons raisins   4 teaspoons sugar   1 tablespoon honey Recheck blood glucose in 15 mins and repeat above if still symptomatic/blood  glucose <100. Please contact our office at [phone] if you have questions about how to next handle your insulin.  RECOMMENDATIONS TO REDUCE YOUR RISK OF DIABETIC COMPLICATIONS: * Take your prescribed MEDICATION(S). * Follow a DIABETIC diet: Complex carbs, fiber rich foods, heart healthy fish twice weekly, (monounsaturated and polyunsaturated) fats * AVOID saturated/trans fats, high fat foods, >2,300 mg salt per day. * EXERCISE at least 5 times a week for 30 minutes or preferably daily.  * DO NOT SMOKE OR DRINK more than 1 drink a day. * Check your FEET every day. Do not wear tightfitting shoes. Contact us if you develop an ulcer * See your EYE doctor once a year or more if needed * Get a FLU shot once a year * Get a PNEUMONIA vaccine once before and once after age 84 years  GOALS:  * Your Hemoglobin A1c of <7%  * fasting sugars need to be <130 * after meals sugars need to be <180 (2h after you start eating) * Your Systolic BP should be 427 or lower  * Your Diastolic BP should be 80 or lower  * Your HDL (Good Cholesterol) should be 40 or higher  * Your LDL (Bad Cholesterol) should be 100 or lower  * Your Triglycerides should be 150 or lower  * Your Urine microalbumin (kidney function) should be <30 * Your Body Mass Index should be 25 or lower   We will be glad to help you achieve these goals. Our telephone number is: [phone].

## 2013-07-13 NOTE — Progress Notes (Signed)
Patient ID: Lori Phillips, female   DOB: 08/30/1955, 58 y.o.   MRN: [id number]  HPI: Lori Phillips is a 58 y.o.-year-old female, referred by her PCP, Dr. Deborra Medina, for management of DM2, insulin-dependent, uncontrolled, with complications (?CAD, gastroparesis, PN).  Patient has been diagnosed with diabetes in ~2009; she has been on insulin since dx. Last hemoglobin A1c was: Lab Results  Component Value Date   HGBA1C 8.7* 07/05/2013   HGBA1C 8.2* 04/26/2012   HGBA1C 8.0* 01/26/2012   Pt is on a regimen of: - Glipizide-Metformin 2.5-500 x 2 bid - Lantus 48 units qhs >> increases the dose if eats more sweets: 50 units - Victoza 1.8 mg  Pt used to check sugars 3x a day in the past >> meter broke 2 mo ago >> not checking now. She now got a OneTouch meter >> started to check but only checked once. Sugars from 2 mo ago: - am: 100-125  - later in the day: higher, up to 200. Has lows. Lowest sugar was 78 x 1 (at waking up); she has hypoglycemia awareness - ? Level. She felt low recently >> did not check level, but got glucose tablets as she felt she might be fainting. Highest sugar was 250s.  Pt's meals are: - Breakfast: oatmeal - instant; with water or milk - Lunch: sandwich or frozen meal for lunch - Dinner: meat + 3 veggies - Snacks: 1: potato chips, cookies She likes sweets - usually watches this, but not lately. Her daughter does the cooking for her.  She saw nutrition in the past.   - has mild CKD, last BUN/creatinine:  Lab Results  Component Value Date   BUN 10 07/05/2013   CREATININE 1.0 07/05/2013  On Lisinopril. - last set of lipids: Lab Results  Component Value Date   CHOL 250* 07/05/2013   HDL 46.20 07/05/2013   LDLCALC 80 07/05/2013   LDLDIRECT 47.8 04/13/2013   TRIG 618.0* 07/05/2013   CHOLHDL 5 07/05/2013  On Crestor 20 - did not get it from pharmacy at last visit - did not have it in ~2 mo. - last eye exam was in 2013. No DR. Had cataract sx bilat.  - + numbness and tingling in  her feet. On neuropathy. Last foot exam: by PCP: 07/06/2013.  Pt has FH of DM in mother, father, GM.  ROS: Constitutional: + weight gain, + fatigue, no subjective hyperthermia/hypothermia, + poor sleep; + nocturia Eyes: + blurry vision, no xerophthalmia ENT: no sore throat, no nodules palpated in throat, + dysphagia/no odynophagia,+ hoarseness Cardiovascular: + all: CP/SOB/palpitations/leg swelling Respiratory: + both: cough/SOB Gastrointestinal: no N/V/D/C/ + Heartburn Musculoskeletal: + all: muscle/joint aches Skin: no rashes, + hair loss Neurological: + tremors/no numbness/tingling/dizziness, + HA Psychiatric: + both: depression/anxiety Low libido  Past Medical History  Diagnosis Date  . CAD (coronary artery disease)     multiple caths  . Diabetes mellitus   . Hyperlipidemia   . Hypertension   . Thyroid disease     hypothyroid  . Low back pain   . Nephrolithiasis   . Fibromyalgia   . Bipolar disorder   . Anxiety    Past Surgical History  Procedure Laterality Date  . Vaginal hysterectomy    . Tonsillectomy    . Colonoscopy  multiple    2005 [place] records not available-polyps x 3   History   Social History  . Marital Status: Widow - husband killed at work when 39 y/o - in [place] Name:  N/A    Number of Children: 3   Occupational History  . disability     Social History Main Topics  . Smoking status: Never Smoker   . Smokeless tobacco: Never Used  . Alcohol Use: No  . Drug Use: No   Current Outpatient Prescriptions on File Prior to Visit  Medication Sig Dispense Refill  . ABILIFY 10 MG tablet Take 1 tablet by mouth once.      Marland Kitchen aspirin 81 MG tablet Take 81 mg by mouth daily.      . B-D ULTRAFINE III SHORT PEN 31G X 8 MM MISC USE AS DIRECTED  100 each  5  . Blood Glucose Monitoring Suppl (ONE TOUCH BASIC SYSTEM) W/DEVICE KIT Check blood sugar three times a day and as directed. Dx 250.00  1 each  0  . clonazePAM (KLONOPIN) 0.5 MG tablet Take 1  tablet by mouth once.      . CRESTOR 20 MG tablet TAKE 1 TABLET BY MOUTH EVERY DAY  90 tablet  0  . divalproex (DEPAKOTE ER) 500 MG 24 hr tablet TAKE 1 TABLET BY MOUTH TWICE DAILY  60 tablet  0  . DULoxetine (CYMBALTA) 60 MG capsule TAKE 1 CAPSULE BY MOUTH ONCE DAILY  90 capsule  3  . gabapentin (NEURONTIN) 100 MG capsule TAKE 2 CAPSULES BY MOUTH THREE TIMES DAILY.  180 capsule  0  . glipiZIDE-metformin (METAGLIP) 2.5-500 MG per tablet TAKE 2 TABLETS BY MOUTH TWICE DAILY BEFORE MEALS  360 tablet  0  . glucose blood (ONE TOUCH TEST STRIPS) test strip Check blood sugar three times daily and as directed. Dx 250.00  270 each  3  . ibuprofen (ADVIL,MOTRIN) 800 MG tablet 1 tab by mouth three times daily until resolution (at least until her appointment on 4/28).  30 tablet  0  . Insulin Pen Needle 31G X 5 MM MISC Use to check blood sugar as directed  100 each  12  . Insulin Pen Needle 31G X 5 MM MISC Use to check blood sugar as directed  100 each  12  . LANTUS SOLOSTAR 100 UNIT/ML SOPN INJECT 46 UNITS UNDER THE SKIN DAILY  45 mL  0  . levothyroxine (SYNTHROID, LEVOTHROID) 175 MCG tablet Take 1 tablet (175 mcg total) by mouth daily before breakfast.  90 tablet  1  . lisinopril (PRINIVIL,ZESTRIL) 5 MG tablet TAKE 1 TABLET BY MOUTH EVERY DAY  30 tablet  1  . Omega-3 Fatty Acids (FISH OIL) 1000 MG CAPS Take one by mouth daily      . ONE TOUCH LANCETS MISC Check blood sugar three times daily and as directed.Dx 250.00  270 each  3  . Oxycodone HCl 10 MG TABS 1 tab by mouth every eight hours as needed for pain.  90 tablet  0  . pantoprazole (PROTONIX) 40 MG tablet TAKE 1 TABLET BY MOUTH EVERY DAY  90 tablet  0  . TAZTIA XT 180 MG 24 hr capsule TAKE ONE CAPSULE BY MOUTH EVERY DAY  90 capsule  0  . traMADol (ULTRAM) 50 MG tablet TAKE 2 TABLETS BY MOUTH FOUR TIMES DAILY  240 tablet  0  . VICTOZA 18 MG/3ML SOPN INJECT 1.2MG EVERY DAY AS DIRECTED.  6 mL  0   No current facility-administered medications on file  prior to visit.   Allergies  Allergen Reactions  . Byetta 10 Mcg Pen [Exenatide] Nausea And Vomiting  . Quetiapine    Family History  Problem Relation  Age of Onset  . Hypertension Mother   . Diabetes Mother   . Bipolar disorder Father   . Colon cancer Paternal Aunt   . Colon cancer Paternal Grandmother    PE: BP 118/74  Pulse 81  Temp(Src) 97.7 F (36.5 C) (Oral)  Resp 12  Ht 5' 4.5" (1.638 m)  Wt 192 lb 12.8 oz (87.454 kg)  BMI 32.60 kg/m2  SpO2 98% Wt Readings from Last 3 Encounters:  07/13/13 192 lb 12.8 oz (87.454 kg)  07/05/13 191 lb (86.637 kg)  10/01/12 200 lb (90.719 kg)   Constitutional: overweight, in NAD. Appears depressed. + [place] hump and kyphosis.  Eyes: PERRLA, EOMI, no exophthalmos ENT: moist mucous membranes, no thyromegaly, no cervical lymphadenopathy Cardiovascular: RRR, No MRG Respiratory: CTA B Gastrointestinal: abdomen soft, NT, ND, BS+ Musculoskeletal: no deformities, strength intact in all 4 Skin: moist, warm, no rashes Neurological: no tremor with outstretched hands, DTR normal in all 4  ASSESSMENT: 1. DM2, insulin-dependent, uncontrolled, with complications - ?CAD - had CP in the past >> has 2 catetherizations: 2000 and 2001 - attributed CP to anxiety - PN - gastroparesis  PLAN:  1. Patient with long-standing, recently more uncontrolled diabetes, on oral antidiabetic regimen + basal insulin. I have no CBG data to review at this time, but she started to check sugars with her new meter. She has some lows, but unclear how severe.  - We discussed about options for treatment, and I suggested to:  Patient Instructions  Please continue Glipizide-Metformin 2.5-500 x 2 twice a day. Continue Victoza 1.8 mg daily. Decrease Lantus to 40 units at bedtime. Please check sugars 2-3x a day and write them down. Come back in a month with your sugar log. - Strongly advised her to start checking sugars at different times of the day - check 2-3 times a day,  rotating checks - we discussed about healthy substitutions in her diet.  - given sugar log and advised how to fill it and to bring it at next appt  - given foot care handout and explained the principles  - given instructions for hypoglycemia management "15-15 rule"  - advised for yearly eye exams - Return to clinic in 1 mo with sugar log

## 2013-07-15 ENCOUNTER — Other Ambulatory Visit: Payer: Self-pay | Admitting: Family Medicine

## 2013-07-15 NOTE — Telephone Encounter (Signed)
Pt requesting medication refill. Last ov 06/2013 with no future appts scheduled. pls advise

## 2013-07-25 ENCOUNTER — Encounter: Payer: Self-pay | Admitting: Family Medicine

## 2013-07-25 ENCOUNTER — Other Ambulatory Visit: Payer: Self-pay | Admitting: Family Medicine

## 2013-07-26 NOTE — Telephone Encounter (Signed)
Spoke to pt and informed her Rx has been faxed to requested pharmacy 

## 2013-07-26 NOTE — Telephone Encounter (Signed)
Pt requesting medication refill. Last ov 06/2013 with no future appts scheduled. pls advise 

## 2013-07-27 ENCOUNTER — Telehealth: Payer: Self-pay

## 2013-07-27 NOTE — Telephone Encounter (Signed)
Pt left v/m she is out of glipizide metformin; spoke with Lattie Haw at [facility] and pt has 05/23/13 #360 on hold. Lattie Haw will get rx ready for pick up. Pt advised.

## 2013-08-01 ENCOUNTER — Other Ambulatory Visit: Payer: Self-pay | Admitting: Family Medicine

## 2013-08-01 MED ORDER — OXYCODONE HCL 10 MG PO TABS: ORAL_TABLET | ORAL | Status: DC

## 2013-08-01 NOTE — Telephone Encounter (Signed)
Aliesha notified prescription is ready to be picked up at front desk. 

## 2013-08-01 NOTE — Telephone Encounter (Signed)
Pti s needing refill on Oxycodone. Please advise

## 2013-08-04 ENCOUNTER — Other Ambulatory Visit: Payer: Self-pay | Admitting: Family Medicine

## 2013-08-08 ENCOUNTER — Other Ambulatory Visit: Payer: Self-pay | Admitting: Family Medicine

## 2013-08-10 ENCOUNTER — Encounter: Payer: Self-pay | Admitting: Internal Medicine

## 2013-08-10 ENCOUNTER — Ambulatory Visit (INDEPENDENT_AMBULATORY_CARE_PROVIDER_SITE_OTHER): Payer: Medicare Other | Admitting: Internal Medicine

## 2013-08-10 VITALS — BP 128/82 | HR 64 | Temp 97.6°F | Resp 10 | Wt 188.0 lb

## 2013-08-10 DIAGNOSIS — E119 Type 2 diabetes mellitus without complications: Secondary | ICD-10-CM

## 2013-08-10 NOTE — Patient Instructions (Addendum)
Please decrease the Lantus to 35 units at bedtime. Continue Victoza 1.8 mg daily. Continue Metformin-Glipizide 2 tablets 2x a day.  Please return in 1.5 month with your sugar log.

## 2013-08-10 NOTE — Progress Notes (Signed)
Patient ID: Lori Phillips, female   DOB: 1955/07/14, 58 y.o.   MRN: [id number]  HPI: Lori Phillips is a 58 y.o.-year-old female, returning for f/u for DM2, dx 2009, insulin-dependent since dx, uncontrolled, with complications (?CAD, gastroparesis, PN). Last visit 1 mo ago.  Last hemoglobin A1c was: Lab Results  Component Value Date   HGBA1C 8.7* 07/05/2013   HGBA1C 8.2* 04/26/2012   HGBA1C 8.0* 01/26/2012   Pt is on a regimen of: - Glipizide-Metformin 2.5-500 x 2 bid - Lantus 40 << 48 units qhs - Victoza 1.8 mg  Pt used to check sugars 3x a day in the past. She now got a OneTouch meter >> started to check 2-3x a day >> lows in am: - am: 100-125 >> 57-146 (75-104) -  2h after b'fast: 73, 96, 160 - lunch: 91-135 - 2h after lunch: 113, 138 - dinner: 83-198 (100-130) - 2h after dinner: 117-192 - bedtime: 108-157 Lowest sugar was 57 x 1 (at waking up); she has hypoglycemia awareness 60.Highest sugar was 192.  Pt's meals are: - Breakfast: oatmeal - instant; with water or milk - Lunch: sandwich or frozen meal for lunch - Dinner: meat + 3 veggies She likes sweets >> cut down significantly. Her daughter does the cooking for her.  She saw nutrition in the past.   - has mild CKD, last BUN/creatinine:  Lab Results  Component Value Date   BUN 10 07/05/2013   CREATININE 1.0 07/05/2013  On Lisinopril. - last set of lipids: Lab Results  Component Value Date   CHOL 250* 07/05/2013   HDL 46.20 07/05/2013   LDLCALC 80 07/05/2013   LDLDIRECT 47.8 04/13/2013   TRIG 618.0* 07/05/2013   CHOLHDL 5 07/05/2013  Now back on Crestor 20. - last eye exam was in 2013. No DR. Had cataract sx bilat.  - + numbness and tingling in her feet. Last foot exam: by PCP: 07/06/2013.  I reviewed pt's medications, allergies, PMH, social hx, family hx and no changes required, except as mentioned above.  ROS: Constitutional: no weight gain/loss, no fatigue, no subjective hyperthermia/hypothermia, + nocturia Eyes: +  blurry vision, no xerophthalmia ENT: no sore throat, no nodules palpated in throat, no dysphagia/odynophagia, no hoarseness Cardiovascular: no CP/SOB/palpitations/leg swelling Respiratory: no cough/SOB Gastrointestinal: no N/V/D/C Musculoskeletal: no muscle/+ joint aches Skin: no rashes Neurological: no tremors/numbness/tingling/dizziness  PE: BP 128/82  Pulse 64  Temp(Src) 97.6 F (36.4 C) (Oral)  Resp 10  Wt 188 lb (85.276 kg)  SpO2 97% Wt Readings from Last 3 Encounters:  08/10/13 188 lb (85.276 kg)  07/13/13 192 lb 12.8 oz (87.454 kg)  07/05/13 191 lb (86.637 kg)   Constitutional: overweight, in NAD.  Eyes: PERRLA, EOMI, no exophthalmos ENT: moist mucous membranes, no thyromegaly, no cervical lymphadenopathy Cardiovascular: RRR, No MRG Respiratory: CTA B Gastrointestinal: abdomen soft, NT, ND, BS+ Musculoskeletal: no deformities, strength intact in all 4 Skin: moist, warm, no rashes Neurological: no tremor with outstretched hands, DTR normal in all 4  ASSESSMENT: 1. DM2, insulin-dependent, uncontrolled, with complications - ?CAD - had CP in the past >> has 2 catetherizations: 2000 and 2001 - attributed CP to anxiety - PN - gastroparesis  PLAN:  1. Patient with long-standing, uncontrolled diabetes, on oral antidiabetic regimen + basal insulin, now with improved control and even low CBGs in am. She improved her diet (cut down portions and sweets) >> lost 4 lbs. - We discussed about options for treatment, and I suggested to:  Patient Instructions  Please decrease the  Lantus to 35 units at bedtime. Continue Victoza 1.8 mg daily. Continue Metformin-Glipizide 2 tablets 2x a day. Please return in 1.5 month with your sugar log.  - continue checking sugars at different times of the day - check 2-3 times a day, rotating checks. She is doing a great job with this per review of her log. - advised for yearly eye exams >> Dr Lysle Morales >> needs to schedule new appt - Return to clinic  in 1.5 mo with sugar log

## 2013-08-12 ENCOUNTER — Other Ambulatory Visit: Payer: Self-pay | Admitting: Internal Medicine

## 2013-08-16 ENCOUNTER — Other Ambulatory Visit: Payer: Self-pay | Admitting: Family Medicine

## 2013-08-16 ENCOUNTER — Other Ambulatory Visit: Payer: Self-pay | Admitting: Internal Medicine

## 2013-08-16 ENCOUNTER — Other Ambulatory Visit: Payer: Self-pay | Admitting: *Deleted

## 2013-08-16 MED ORDER — LIRAGLUTIDE 18 MG/3ML ~~LOC~~ SOPN: 1.8000 mg | PEN_INJECTOR | Freq: Every day | SUBCUTANEOUS | Status: DC

## 2013-08-16 MED ORDER — GABAPENTIN 100 MG PO CAPS: 200.0000 mg | ORAL_CAPSULE | Freq: Three times a day (TID) | ORAL | Status: DC

## 2013-08-16 NOTE — Telephone Encounter (Signed)
Pt requesting medication refill; Med on Hx only listed as tiazac. Last ov 06/2013 with f/u appt scheduled 08/18/2013. pls advise

## 2013-08-18 ENCOUNTER — Encounter: Payer: Medicare Other | Admitting: Family Medicine

## 2013-08-21 ENCOUNTER — Other Ambulatory Visit: Payer: Self-pay | Admitting: Family Medicine

## 2013-08-22 NOTE — Telephone Encounter (Signed)
Last office visit 07/05/2013.  Ok to refill? 

## 2013-08-22 NOTE — Telephone Encounter (Signed)
Prescription faxed to [facility].

## 2013-08-26 ENCOUNTER — Ambulatory Visit (INDEPENDENT_AMBULATORY_CARE_PROVIDER_SITE_OTHER)
Admission: RE | Admit: 2013-08-26 | Discharge: 2013-08-26 | Disposition: A | Discharge status: Home / Self Care | Payer: Medicare Other | Source: Ambulatory Visit | Attending: Family Medicine | Admitting: Family Medicine

## 2013-08-26 ENCOUNTER — Ambulatory Visit (INDEPENDENT_AMBULATORY_CARE_PROVIDER_SITE_OTHER): Payer: Medicare Other | Admitting: Family Medicine

## 2013-08-26 ENCOUNTER — Encounter: Payer: Self-pay | Admitting: Family Medicine

## 2013-08-26 VITALS — BP 124/74 | HR 73 | Temp 97.6°F | Ht 64.75 in | Wt 186.5 lb

## 2013-08-26 DIAGNOSIS — E1149 Type 2 diabetes mellitus with other diabetic neurological complication: Secondary | ICD-10-CM

## 2013-08-26 DIAGNOSIS — I2510 Atherosclerotic heart disease of native coronary artery without angina pectoris: Secondary | ICD-10-CM

## 2013-08-26 DIAGNOSIS — M25562 Pain in left knee: Secondary | ICD-10-CM

## 2013-08-26 DIAGNOSIS — E039 Hypothyroidism, unspecified: Secondary | ICD-10-CM

## 2013-08-26 DIAGNOSIS — M25569 Pain in unspecified knee: Secondary | ICD-10-CM

## 2013-08-26 DIAGNOSIS — E1143 Type 2 diabetes mellitus with diabetic autonomic (poly)neuropathy: Secondary | ICD-10-CM

## 2013-08-26 DIAGNOSIS — M51379 Other intervertebral disc degeneration, lumbosacral region without mention of lumbar back pain or lower extremity pain: Secondary | ICD-10-CM

## 2013-08-26 DIAGNOSIS — F319 Bipolar disorder, unspecified: Secondary | ICD-10-CM

## 2013-08-26 DIAGNOSIS — M5137 Other intervertebral disc degeneration, lumbosacral region: Secondary | ICD-10-CM

## 2013-08-26 DIAGNOSIS — M810 Age-related osteoporosis without current pathological fracture: Secondary | ICD-10-CM | POA: Insufficient documentation

## 2013-08-26 DIAGNOSIS — E785 Hyperlipidemia, unspecified: Secondary | ICD-10-CM

## 2013-08-26 DIAGNOSIS — G8929 Other chronic pain: Secondary | ICD-10-CM

## 2013-08-26 DIAGNOSIS — Z0000 Encounter for general adult medical examination without abnormal findings: Secondary | ICD-10-CM

## 2013-08-26 DIAGNOSIS — F411 Generalized anxiety disorder: Secondary | ICD-10-CM

## 2013-08-26 DIAGNOSIS — E1165 Type 2 diabetes mellitus with hyperglycemia: Secondary | ICD-10-CM

## 2013-08-26 DIAGNOSIS — M5136 Other intervertebral disc degeneration, lumbar region: Secondary | ICD-10-CM

## 2013-08-26 DIAGNOSIS — K3184 Gastroparesis: Secondary | ICD-10-CM

## 2013-08-26 DIAGNOSIS — G609 Hereditary and idiopathic neuropathy, unspecified: Secondary | ICD-10-CM

## 2013-08-26 DIAGNOSIS — I10 Essential (primary) hypertension: Secondary | ICD-10-CM

## 2013-08-26 DIAGNOSIS — IMO0002 Reserved for concepts with insufficient information to code with codable children: Secondary | ICD-10-CM

## 2013-08-26 DIAGNOSIS — Z23 Encounter for immunization: Secondary | ICD-10-CM

## 2013-08-26 LAB — T4, FREE: FREE T4: 1.44 ng/dL (ref 0.60–1.60)

## 2013-08-26 LAB — TSH: TSH: 0.16 u[IU]/mL — AB (ref 0.35–4.50)

## 2013-08-26 LAB — MICROALBUMIN / CREATININE URINE RATIO
CREATININE, U: 318.5 mg/dL
Microalb Creat Ratio: 1.9 mg/g (ref 0.0–30.0)
Microalb, Ur: 5.9 mg/dL — ABNORMAL HIGH (ref 0.0–1.9)

## 2013-08-26 LAB — VITAMIN B12: VITAMIN B 12: 292 pg/mL (ref 211–911)

## 2013-08-26 NOTE — Addendum Note (Signed)
Addended by: Ellamae Sia on: 08/26/2013 02:37 PM   Modules accepted: Orders

## 2013-08-26 NOTE — Progress Notes (Signed)
Pre visit review using our clinic review tool, if applicable. No additional management support is needed unless otherwise documented below in the visit note. 

## 2013-08-26 NOTE — Assessment & Plan Note (Addendum)
The patients weight, height, BMI and visual acuity have been recorded in the chart I have made referrals, counseling and provided education to the patient based review of the above and I have provided the pt with a written personalized care plan for preventive services.  Zostavax today. Pneumovax today due to h/o poorly controlled DM.

## 2013-08-26 NOTE — Assessment & Plan Note (Signed)
Stable on current rx.  

## 2013-08-26 NOTE — Patient Instructions (Addendum)
Please bring [place] a copy of your living will to put in the chart.  Please make an appointment with your eye doctor.  I will call you with your xray and labs results.

## 2013-08-26 NOTE — Assessment & Plan Note (Signed)
Probable OA. Will get xray given duration of symptoms.

## 2013-08-26 NOTE — Assessment & Plan Note (Signed)
Recheck labs today. Asymptomatic.

## 2013-08-26 NOTE — Assessment & Plan Note (Signed)
UDS up to date.

## 2013-08-26 NOTE — Addendum Note (Signed)
Addended by: Modena Nunnery on: 08/26/2013 01:59 PM   Modules accepted: Orders

## 2013-08-26 NOTE — Assessment & Plan Note (Signed)
Followed by Dr. Gherghe °

## 2013-08-26 NOTE — Assessment & Plan Note (Signed)
Well controlled 

## 2013-08-26 NOTE — Assessment & Plan Note (Signed)
Quiet On ASA, beta blocker and statin. 

## 2013-08-26 NOTE — Assessment & Plan Note (Signed)
DEXA 

## 2013-08-26 NOTE — Progress Notes (Addendum)
58 yo here for med refills and is overdue for medicare wellness visit.  I have personally reviewed the Medicare Annual Wellness questionnaire and have noted 1. The patient's medical and social history 2. Their use of alcohol, tobacco or illicit drugs 3. Their current medications and supplements 4. The patient's functional ability including ADL's, fall risks, home safety risks and hearing or visual             impairment. 5. Diet and physical activities 6. Evidence for depression or mood disorders  End of life wishes discussed and updated in Social History/QM.  S/p hysterectomy Mammo 07/12/13 Colonoscopy 10/03/11 [facility])- recall in 3 years due to adenomas  DM- Sees Dr. Elvera Lennox. Last saw her on 07/13/13- note reviewed. Lantus decreased to 40 units. Has started checking FSBS three times daily- this morning fasting was 62.  Denies any recent episodes of hypoglycemic symptoms.  Due for an eye exam.   Lab Results  Component Value Date   HGBA1C 8.7* 07/05/2013   Neuropathy- better with Gabapentin, takes 200 mg three times daily  Lab Results  Component Value Date   CHOL 250* 07/05/2013   HDL 46.20 07/05/2013   LDLCALC 80 07/05/2013   LDLDIRECT 47.8 04/13/2013   TRIG 618.0* 07/05/2013   CHOLHDL 5 07/05/2013    Hypothyroidism- on synthroid 175 mcg daily.  Denies any symptoms of hypo or hyperthyroidism.  TSH was a little low in 06/2013.  Lab Results  Component Value Date   TSH 0.34* 07/05/2013   Manic depression- followed by psychiatry.  Left knee pain- past 3 -5 months, progressive left knee pain and "clicking."  Sometimes feels like her knee will give out.  No known injury or trauma but has been helping her neighbor who had a hip replacement.  No redness or warmth.  Patient Active Problem List   Diagnosis Date Noted  . Routine general medical examination at a health care facility 08/26/2013  . Knee pain, left 08/26/2013  . DDD (degenerative disc disease), lumbar 11/24/2011  .  Personal history of colonic polyps 10/03/2011  . Gastroparesis due to DM 10/01/2011  . Other chronic pain 02/21/2010  . HYPOTHYROIDISM 11/26/2009  . Type 2 diabetes, uncontrolled, with gastroparesis 11/26/2009  . HYPERLIPIDEMIA 11/26/2009  . BIPOLAR DISORDER UNSPECIFIED 11/26/2009  . ANXIETY 11/26/2009  . HYPERTENSION 11/26/2009  . CORONARY ARTERY DISEASE 11/26/2009  . LOW BACK PAIN 11/26/2009  . NEPHROLITHIASIS, HX OF 11/26/2009   Past Medical History  Diagnosis Date  . CAD (coronary artery disease)     multiple caths  . Diabetes mellitus   . Hyperlipidemia   . Hypertension   . Thyroid disease     hypothyroid  . Low back pain   . Nephrolithiasis   . Fibromyalgia   . Bipolar disorder   . Anxiety    Past Surgical History  Procedure Laterality Date  . Vaginal hysterectomy    . Tonsillectomy    . Colonoscopy  multiple    2005 [place] records not available-polyps x 3   History  Substance Use Topics  . Smoking status: Never Smoker   . Smokeless tobacco: Never Used  . Alcohol Use: No   Family History  Problem Relation Age of Onset  . Hypertension Mother   . Diabetes Mother   . Bipolar disorder Father   . Colon cancer Paternal Aunt   . Colon cancer Paternal Grandmother    Allergies  Allergen Reactions  . Byetta 10 Mcg Pen [Exenatide] Nausea And Vomiting  .  Quetiapine    Current Outpatient Prescriptions on File Prior to Visit  Medication Sig Dispense Refill  . ABILIFY 10 MG tablet Take 1 tablet by mouth once.      Marland Kitchen aspirin 81 MG tablet Take 81 mg by mouth daily.      . B-D ULTRAFINE III SHORT PEN 31G X 8 MM MISC USE AS DIRECTED  100 each  5  . Blood Glucose Monitoring Suppl (ONE TOUCH BASIC SYSTEM) W/DEVICE KIT Check blood sugar three times a day and as directed. Dx 250.00  1 each  0  . CARTIA XT 180 MG 24 hr capsule TAKE 1 CAPSULE BY MOUTH EVERY DAY  90 capsule  0  . clonazePAM (KLONOPIN) 0.5 MG tablet Take 1 tablet by mouth once.      . CRESTOR 20 MG  tablet TAKE 1 TABLET BY MOUTH EVERY DAY  90 tablet  0  . divalproex (DEPAKOTE ER) 500 MG 24 hr tablet TAKE 1 TABLET BY MOUTH TWICE DAILY  60 tablet  0  . DULoxetine (CYMBALTA) 60 MG capsule TAKE 1 CAPSULE BY MOUTH ONCE DAILY  90 capsule  3  . gabapentin (NEURONTIN) 100 MG capsule Take 2 capsules (200 mg total) by mouth 3 (three) times daily.  180 capsule  2  . glipiZIDE-metformin (METAGLIP) 2.5-500 MG per tablet TAKE 2 TABLETS BY MOUTH TWICE DAILY BEFORE MEALS  360 tablet  0  . glucose blood (ONE TOUCH TEST STRIPS) test strip Check blood sugar three times daily and as directed. Dx 250.00  270 each  3  . ibuprofen (ADVIL,MOTRIN) 800 MG tablet 1 tab by mouth three times daily until resolution (at least until her appointment on 4/28).  30 tablet  0  . Insulin Pen Needle 31G X 5 MM MISC Use to check blood sugar as directed  100 each  12  . Insulin Pen Needle 31G X 5 MM MISC Use to check blood sugar as directed  100 each  12  . LANTUS SOLOSTAR 100 UNIT/ML SOPN INJECT 46 UNITS UNDER THE SKIN DAILY  45 mL  0  . levothyroxine (SYNTHROID, LEVOTHROID) 175 MCG tablet Take 1 tablet (175 mcg total) by mouth daily before breakfast.  90 tablet  1  . Liraglutide (VICTOZA) 18 MG/3ML SOPN Inject 1.8 mg as directed daily.  3 pen  2  . lisinopril (PRINIVIL,ZESTRIL) 5 MG tablet TAKE ONE TABLET BY MOUTH EVERY DAY  30 tablet  1  . Omega-3 Fatty Acids (FISH OIL) 1000 MG CAPS Take one by mouth daily      . ONE TOUCH LANCETS MISC Check blood sugar three times daily and as directed.Dx 250.00  270 each  3  . Oxycodone HCl 10 MG TABS 1 tab by mouth every eight hours as needed for pain.  90 tablet  0  . pantoprazole (PROTONIX) 40 MG tablet TAKE 1 TABLET BY MOUTH EVERY DAY  90 tablet  0  . traMADol (ULTRAM) 50 MG tablet TAKE 2 TABLETS BY MOUTH FOUR TIMES DAILY  240 tablet  0  . [DISCONTINUED] TAZTIA XT 180 MG 24 hr capsule TAKE ONE CAPSULE BY MOUTH EVERY DAY  90 capsule  0   No current facility-administered medications on file  prior to visit.   The PMH, PSH, Social History, Family History, Medications, and allergies have been reviewed in [facility], and have been updated if relevant.    Review of Systems       See HPI  No abnormal loose stools No vomiting  No blood in stool Denies any anxiety, depression or difficulty concentrating. Appetite good- losing weight because she is trying to eat a diabetic diet now Wt Readings from Last 3 Encounters:  08/26/13 186 lb 8 oz (84.596 kg)  08/10/13 188 lb (85.276 kg)  07/13/13 192 lb 12.8 oz (87.454 kg)     Patient Active Problem List   Diagnosis Date Noted  . Routine general medical examination at a health care facility 08/26/2013  . Knee pain, left 08/26/2013  . DDD (degenerative disc disease), lumbar 11/24/2011  . Personal history of colonic polyps 10/03/2011  . Gastroparesis due to DM 10/01/2011  . Other chronic pain 02/21/2010  . HYPOTHYROIDISM 11/26/2009  . Type 2 diabetes, uncontrolled, with gastroparesis 11/26/2009  . HYPERLIPIDEMIA 11/26/2009  . BIPOLAR DISORDER UNSPECIFIED 11/26/2009  . ANXIETY 11/26/2009  . HYPERTENSION 11/26/2009  . CORONARY ARTERY DISEASE 11/26/2009  . LOW BACK PAIN 11/26/2009  . NEPHROLITHIASIS, HX OF 11/26/2009   Past Medical History  Diagnosis Date  . CAD (coronary artery disease)     multiple caths  . Diabetes mellitus   . Hyperlipidemia   . Hypertension   . Thyroid disease     hypothyroid  . Low back pain   . Nephrolithiasis   . Fibromyalgia   . Bipolar disorder   . Anxiety    Past Surgical History  Procedure Laterality Date  . Vaginal hysterectomy    . Tonsillectomy    . Colonoscopy  multiple    2005 [place] records not available-polyps x 3   History  Substance Use Topics  . Smoking status: Never Smoker   . Smokeless tobacco: Never Used  . Alcohol Use: No   Family History  Problem Relation Age of Onset  . Hypertension Mother   . Diabetes Mother   . Bipolar disorder Father   . Colon cancer Paternal  Aunt   . Colon cancer Paternal Grandmother    Allergies  Allergen Reactions  . Byetta 10 Mcg Pen [Exenatide] Nausea And Vomiting  . Quetiapine    Current Outpatient Prescriptions on File Prior to Visit  Medication Sig Dispense Refill  . ABILIFY 10 MG tablet Take 1 tablet by mouth once.      Marland Kitchen aspirin 81 MG tablet Take 81 mg by mouth daily.      . B-D ULTRAFINE III SHORT PEN 31G X 8 MM MISC USE AS DIRECTED  100 each  5  . Blood Glucose Monitoring Suppl (ONE TOUCH BASIC SYSTEM) W/DEVICE KIT Check blood sugar three times a day and as directed. Dx 250.00  1 each  0  . CARTIA XT 180 MG 24 hr capsule TAKE 1 CAPSULE BY MOUTH EVERY DAY  90 capsule  0  . clonazePAM (KLONOPIN) 0.5 MG tablet Take 1 tablet by mouth once.      . CRESTOR 20 MG tablet TAKE 1 TABLET BY MOUTH EVERY DAY  90 tablet  0  . divalproex (DEPAKOTE ER) 500 MG 24 hr tablet TAKE 1 TABLET BY MOUTH TWICE DAILY  60 tablet  0  . DULoxetine (CYMBALTA) 60 MG capsule TAKE 1 CAPSULE BY MOUTH ONCE DAILY  90 capsule  3  . gabapentin (NEURONTIN) 100 MG capsule Take 2 capsules (200 mg total) by mouth 3 (three) times daily.  180 capsule  2  . glipiZIDE-metformin (METAGLIP) 2.5-500 MG per tablet TAKE 2 TABLETS BY MOUTH TWICE DAILY BEFORE MEALS  360 tablet  0  . glucose blood (ONE TOUCH TEST STRIPS) test strip Check blood  sugar three times daily and as directed. Dx 250.00  270 each  3  . ibuprofen (ADVIL,MOTRIN) 800 MG tablet 1 tab by mouth three times daily until resolution (at least until her appointment on 4/28).  30 tablet  0  . Insulin Pen Needle 31G X 5 MM MISC Use to check blood sugar as directed  100 each  12  . Insulin Pen Needle 31G X 5 MM MISC Use to check blood sugar as directed  100 each  12  . LANTUS SOLOSTAR 100 UNIT/ML SOPN INJECT 46 UNITS UNDER THE SKIN DAILY  45 mL  0  . levothyroxine (SYNTHROID, LEVOTHROID) 175 MCG tablet Take 1 tablet (175 mcg total) by mouth daily before breakfast.  90 tablet  1  . Liraglutide (VICTOZA) 18  MG/3ML SOPN Inject 1.8 mg as directed daily.  3 pen  2  . lisinopril (PRINIVIL,ZESTRIL) 5 MG tablet TAKE ONE TABLET BY MOUTH EVERY DAY  30 tablet  1  . Omega-3 Fatty Acids (FISH OIL) 1000 MG CAPS Take one by mouth daily      . ONE TOUCH LANCETS MISC Check blood sugar three times daily and as directed.Dx 250.00  270 each  3  . Oxycodone HCl 10 MG TABS 1 tab by mouth every eight hours as needed for pain.  90 tablet  0  . pantoprazole (PROTONIX) 40 MG tablet TAKE 1 TABLET BY MOUTH EVERY DAY  90 tablet  0  . traMADol (ULTRAM) 50 MG tablet TAKE 2 TABLETS BY MOUTH FOUR TIMES DAILY  240 tablet  0  . [DISCONTINUED] TAZTIA XT 180 MG 24 hr capsule TAKE ONE CAPSULE BY MOUTH EVERY DAY  90 capsule  0   No current facility-administered medications on file prior to visit.    The PMH, PSH, Social History, Family History, Medications, and allergies have been reviewed in [facility], and have been updated if relevant.  Physical Exam BP 124/74  Pulse 73  Temp(Src) 97.6 F (36.4 C) (Oral)  Ht 5' 4.75" (1.645 m)  Wt 186 lb 8 oz (84.596 kg)  BMI 31.26 kg/m2  SpO2 97%  General:  alert, well-developed, and well-nourished.   Head:  normocephalic and atraumatic.   Nose:  no external deformity.   Mouth:  Oral mucosa and oropharynx without lesions or exudates.  Teeth in good repair. Lungs:  Normal respiratory effort, chest expands symmetrically. Lungs are clear to auscultation, no crackles or wheezes. Heart:  Normal rate and regular rhythm. S1 and S2 normal without gallop, murmur, click, rub or other extra sounds. Abdomen:  Bowel sounds positive,abdomen soft and non-tender.  Some tightness of paraspinous muscles. Psych:  Cognition and judgment appear intact. Alert and cooperative with normal attention span and concentration. No apparent delusions, illusions, hallucinations Left Knee- no effusion, no redness or warmth, pos crepitus, no laxity Back:  kyphosis  Diabetic foot exam: Normal inspection No skin  breakdown No calluses  Normal DP pulses Normal sensation to light touch and monofilament Nails normal

## 2013-08-26 NOTE — Addendum Note (Signed)
Addended by: Lucille Passy on: 08/26/2013 01:29 PM   Modules accepted: Orders

## 2013-08-29 ENCOUNTER — Other Ambulatory Visit: Payer: Self-pay | Admitting: Family Medicine

## 2013-08-29 DIAGNOSIS — M25569 Pain in unspecified knee: Secondary | ICD-10-CM

## 2013-09-02 ENCOUNTER — Telehealth: Payer: Self-pay | Admitting: Family Medicine

## 2013-09-02 ENCOUNTER — Encounter: Payer: Self-pay | Admitting: Family Medicine

## 2013-09-02 MED ORDER — OXYCODONE HCL 10 MG PO TABS: ORAL_TABLET | ORAL | Status: DC

## 2013-09-02 NOTE — Telephone Encounter (Signed)
Spoke to pt and informed her Rx will be available for pickup after 1400. Placed in Dr Hulen Shouts inbox for signature upon arrival.

## 2013-09-02 NOTE — Telephone Encounter (Signed)
Pt called and would like to get Oxycodone HCl 10 MG TABS refilled.  Please call pt at [phone] when ready for pickup / lt

## 2013-09-02 NOTE — Telephone Encounter (Signed)
Please enter as refill request. 

## 2013-09-22 ENCOUNTER — Other Ambulatory Visit: Payer: Self-pay | Admitting: Family Medicine

## 2013-09-23 NOTE — Telephone Encounter (Signed)
Spoke to pt and informed her Rx has been called in to requested pharmacy 

## 2013-09-23 NOTE — Telephone Encounter (Signed)
Pt requesting medication refill. Last ov-CPE 08/2013 with no future appts scheduled. pls advise

## 2013-09-25 ENCOUNTER — Other Ambulatory Visit: Payer: Self-pay | Admitting: Family Medicine

## 2013-09-28 ENCOUNTER — Encounter: Payer: Self-pay | Admitting: Family Medicine

## 2013-09-28 ENCOUNTER — Other Ambulatory Visit: Payer: Self-pay

## 2013-09-28 MED ORDER — OXYCODONE HCL 10 MG PO TABS: ORAL_TABLET | ORAL | Status: DC

## 2013-09-28 NOTE — Telephone Encounter (Signed)
Pt is leaving for vacation on 09/30/13 and pt request to pick up oxycodone rx on 09/29/13. Call when ready for pick up. Pt is going to take rx with her to [place] and have filled on 10/03/13 at the [facility] in [place].Please advise.

## 2013-09-29 MED ORDER — OXYCODONE HCL 10 MG PO TABS: ORAL_TABLET | ORAL | Status: DC

## 2013-09-29 NOTE — Telephone Encounter (Signed)
Lm on pts vm informing her Rx is available for pickup at the front desk 

## 2013-09-29 NOTE — Addendum Note (Signed)
Addended by: Modena Nunnery on: 09/29/2013 09:57 AM   Modules accepted: Orders

## 2013-09-29 NOTE — Telephone Encounter (Signed)
Pt returned call; advised pt rx ready for pick up. Pt voiced understanding.

## 2013-09-30 ENCOUNTER — Encounter: Payer: Self-pay | Admitting: Family Medicine

## 2013-10-12 ENCOUNTER — Telehealth: Payer: Self-pay | Admitting: *Deleted

## 2013-10-12 NOTE — Telephone Encounter (Signed)
UDS results received and per Dr Deborra Medina, pt needing OV to discuss. OV sched for 10/13/13

## 2013-10-13 ENCOUNTER — Encounter: Payer: Self-pay | Admitting: Family Medicine

## 2013-10-13 ENCOUNTER — Ambulatory Visit (INDEPENDENT_AMBULATORY_CARE_PROVIDER_SITE_OTHER): Payer: Medicare Other | Admitting: Family Medicine

## 2013-10-13 VITALS — BP 138/78 | HR 85 | Temp 97.9°F | Wt 181.0 lb

## 2013-10-13 DIAGNOSIS — R892 Abnormal level of other drugs, medicaments and biological substances in specimens from other organs, systems and tissues: Secondary | ICD-10-CM

## 2013-10-13 DIAGNOSIS — G8929 Other chronic pain: Secondary | ICD-10-CM

## 2013-10-13 NOTE — Progress Notes (Signed)
 Subjective:   Patient ID: Lori Phillips, female    DOB: 04/21/1955, 58 y.o.   MRN: [id number]  Lori Phillips is a pleasant 58 y.o. year old female who presents to clinic today with Follow-up  on 10/13/2013  HPI: Here to discuss abnormal UDS. Receiving Oxycodone 10 mg three times daily prn pain from me as well as Tramadol and gabapentin- chronic back, knee pain and neuropathy.  Unfortunately, UDS was neg for oxycodone or it's metabolites.  She has been picking up rx for 90 tabs every month since 12/2010.  She states that 5 or 6 days per month, she will take an extra one for severe back pain.  Current Outpatient Prescriptions on File Prior to Visit  Medication Sig Dispense Refill  . ABILIFY 10 MG tablet Take 1 tablet by mouth once.      . aspirin 81 MG tablet Take 81 mg by mouth daily.      . B-D ULTRAFINE III SHORT PEN 31G X 8 MM MISC USE AS DIRECTED  100 each  5  . Blood Glucose Monitoring Suppl (ONE TOUCH BASIC SYSTEM) W/DEVICE KIT Check blood sugar three times a day and as directed. Dx 250.00  1 each  0  . CARTIA XT 180 MG 24 hr capsule TAKE 1 CAPSULE BY MOUTH EVERY DAY  90 capsule  0  . clonazePAM (KLONOPIN) 0.5 MG tablet Take 1 tablet by mouth once.      . CRESTOR 20 MG tablet TAKE 1 TABLET BY MOUTH EVERY DAY  90 tablet  0  . divalproex (DEPAKOTE ER) 500 MG 24 hr tablet TAKE 1 TABLET BY MOUTH TWICE DAILY  60 tablet  0  . DULoxetine (CYMBALTA) 60 MG capsule TAKE 1 CAPSULE BY MOUTH ONCE DAILY  90 capsule  3  . gabapentin (NEURONTIN) 100 MG capsule Take 2 capsules (200 mg total) by mouth 3 (three) times daily.  180 capsule  2  . glipiZIDE-metformin (METAGLIP) 2.5-500 MG per tablet TAKE 2 TABLETS BY MOUTH TWICE DAILY BEFORE MEALS  360 tablet  0  . glucose blood (ONE TOUCH TEST STRIPS) test strip Check blood sugar three times daily and as directed. Dx 250.00  270 each  3  . LANTUS SOLOSTAR 100 UNIT/ML SOPN INJECT 46 UNITS UNDER THE SKIN DAILY  45 mL  0  . levothyroxine (SYNTHROID,  LEVOTHROID) 175 MCG tablet Take 1 tablet (175 mcg total) by mouth daily before breakfast.  90 tablet  1  . Liraglutide (VICTOZA) 18 MG/3ML SOPN Inject 1.8 mg as directed daily.  3 pen  2  . lisinopril (PRINIVIL,ZESTRIL) 5 MG tablet TAKE 1 TABLET BY MOUTH EVERY DAY  30 tablet  2  . ONE TOUCH LANCETS MISC Check blood sugar three times daily and as directed.Dx 250.00  270 each  3  . Oxycodone HCl 10 MG TABS 1 tab by mouth every eight hours as needed for pain.  90 tablet  0  . pantoprazole (PROTONIX) 40 MG tablet TAKE 1 TABLET BY MOUTH EVERY DAY  90 tablet  0  . traMADol (ULTRAM) 50 MG tablet TAKE 2 TABLETS BY MOUTH FOUR TIMES DAILY  240 tablet  0  . [DISCONTINUED] TAZTIA XT 180 MG 24 hr capsule TAKE ONE CAPSULE BY MOUTH EVERY DAY  90 capsule  0   No current facility-administered medications on file prior to visit.    Allergies  Allergen Reactions  . Byetta 10 Mcg Pen [Exenatide] Nausea And Vomiting  . Quetiapine       Past Medical History  Diagnosis Date  . CAD (coronary artery disease)     multiple caths  . Diabetes mellitus   . Hyperlipidemia   . Hypertension   . Thyroid disease     hypothyroid  . Low back pain   . Nephrolithiasis   . Fibromyalgia   . Bipolar disorder   . Anxiety     Past Surgical History  Procedure Laterality Date  . Vaginal hysterectomy    . Tonsillectomy    . Colonoscopy  multiple    2005 [place] records not available-polyps x 3    Family History  Problem Relation Age of Onset  . Hypertension Mother   . Diabetes Mother   . Bipolar disorder Father   . Colon cancer Paternal Aunt   . Colon cancer Paternal Grandmother     History   Social History  . Marital Status: Divorced    Spouse Name: N/A    Number of Children: 3  . Years of Education: N/A   Occupational History  . disability    .     Social History Main Topics  . Smoking status: Never Smoker   . Smokeless tobacco: Never Used  . Alcohol Use: No  . Drug Use: No  . Sexual  Activity: Not on file   Other Topics Concern  . Not on file   Social History Narrative   Has a living will- desires CPR but no prolonged life support if futile.   The PMH, PSH, Social History, Family History, Medications, and allergies have been reviewed in [facility], and have been updated if relevant.   Review of Systems    See HPI Objective:    BP 138/78  Pulse 85  Temp(Src) 97.9 F (36.6 C) (Oral)  Wt 181 lb (82.101 kg)  SpO2 95%   Physical Exam  Gen:  Alert, pleasant, NAD Psych:  Flat affect (baseline), good eye contact, tearful      Assessment & Plan:   Other chronic pain  Abnormal drug screen No Follow-up on file.    

## 2013-10-13 NOTE — Assessment & Plan Note (Signed)
Deteriorated and taking more rx than prescribed. >25 minutes spent in face to face time with patient, >50% spent in counselling on narcotics and neg UDs or coordination of care. Explained that I cannot increase her pain medication and we are certainly not controlling her pain well. Refer to pain clinic. I will refill rx as prescribed until she establishes care with pain clinic.

## 2013-10-13 NOTE — Progress Notes (Signed)
Pre visit review using our clinic review tool, if applicable. No additional management support is needed unless otherwise documented below in the visit note. 

## 2013-10-13 NOTE — Patient Instructions (Addendum)
Good to see you. We will call you with your referral to a pain clinic. Hang in there.

## 2013-10-14 ENCOUNTER — Other Ambulatory Visit: Payer: Self-pay | Admitting: Internal Medicine

## 2013-10-24 ENCOUNTER — Encounter: Payer: Self-pay | Admitting: Family Medicine

## 2013-10-24 ENCOUNTER — Ambulatory Visit: Payer: Self-pay | Admitting: Family Medicine

## 2013-10-25 ENCOUNTER — Other Ambulatory Visit: Payer: Self-pay | Admitting: Family Medicine

## 2013-10-25 NOTE — Telephone Encounter (Signed)
She has recently been referred to pain clinic.  Ok to refill until she gets established with them.

## 2013-10-25 NOTE — Telephone Encounter (Signed)
Pt requesting medication refill. Pt recently failed UDS. Last f/u appt 08/2013-CPE. Pls advise

## 2013-10-26 ENCOUNTER — Other Ambulatory Visit: Payer: Self-pay | Admitting: Family Medicine

## 2013-10-27 NOTE — Telephone Encounter (Signed)
Spoke to pt and informed her Rx has been called in to requested pharmacy 

## 2013-11-01 ENCOUNTER — Other Ambulatory Visit: Payer: Self-pay

## 2013-11-01 MED ORDER — OXYCODONE HCL 10 MG PO TABS: ORAL_TABLET | ORAL | Status: DC

## 2013-11-01 NOTE — Telephone Encounter (Signed)
Pt left v/m requesting rx for oxycodone. Call when ready for pick up. Pt has appt with pain mgt on 12/01/13.

## 2013-11-01 NOTE — Telephone Encounter (Signed)
Spoke to pt and informed her Rx is available for pickup. Pt states appt at pain clinic has been sched for 12/01/2013

## 2013-11-02 ENCOUNTER — Encounter: Payer: Self-pay | Admitting: Family Medicine

## 2013-11-03 ENCOUNTER — Ambulatory Visit (INDEPENDENT_AMBULATORY_CARE_PROVIDER_SITE_OTHER): Payer: Medicare Other | Admitting: Internal Medicine

## 2013-11-03 ENCOUNTER — Encounter: Payer: Self-pay | Admitting: Internal Medicine

## 2013-11-03 VITALS — BP 128/86 | HR 77 | Temp 98.1°F | Wt 184.0 lb

## 2013-11-03 DIAGNOSIS — B354 Tinea corporis: Secondary | ICD-10-CM

## 2013-11-03 MED ORDER — FLUCONAZOLE 100 MG PO TABS: 100.0000 mg | ORAL_TABLET | Freq: Every day | ORAL | Status: DC

## 2013-11-03 NOTE — Progress Notes (Signed)
Pre visit review using our clinic review tool, if applicable. No additional management support is needed unless otherwise documented below in the visit note. 

## 2013-11-03 NOTE — Patient Instructions (Signed)

## 2013-11-03 NOTE — Progress Notes (Signed)
Subjective:    Patient ID: Lori Phillips, female    DOB: 25-Aug-1955, 58 y.o.   MRN: [id number]  HPI  Pt presents to the clinic today with c/o a rash on her left hand. She noticed this 4 days ago. She reports it started out as a little bump which grew in size. She reports that it is very itchy. She has tried A and D ointment without relief.  Review of Systems      Past Medical History  Diagnosis Date  . CAD (coronary artery disease)     multiple caths  . Diabetes mellitus   . Hyperlipidemia   . Hypertension   . Thyroid disease     hypothyroid  . Low back pain   . Nephrolithiasis   . Fibromyalgia   . Bipolar disorder   . Anxiety     Current Outpatient Prescriptions  Medication Sig Dispense Refill  . ABILIFY 10 MG tablet Take 1 tablet by mouth once.      Marland Kitchen aspirin 81 MG tablet Take 81 mg by mouth daily.      . B-D ULTRAFINE III SHORT PEN 31G X 8 MM MISC USE AS DIRECTED  100 each  5  . Blood Glucose Monitoring Suppl (ONE TOUCH BASIC SYSTEM) W/DEVICE KIT Check blood sugar three times a day and as directed. Dx 250.00  1 each  0  . CARTIA XT 180 MG 24 hr capsule TAKE 1 CAPSULE BY MOUTH EVERY DAY  90 capsule  0  . clonazePAM (KLONOPIN) 0.5 MG tablet Take 1 tablet by mouth once.      . CRESTOR 20 MG tablet TAKE 1 TABLET BY MOUTH EVERY DAY  90 tablet  0  . divalproex (DEPAKOTE ER) 500 MG 24 hr tablet TAKE 1 TABLET BY MOUTH TWICE DAILY  60 tablet  0  . DULoxetine (CYMBALTA) 60 MG capsule TAKE 1 CAPSULE BY MOUTH ONCE DAILY  90 capsule  3  . gabapentin (NEURONTIN) 100 MG capsule Take 2 capsules (200 mg total) by mouth 3 (three) times daily.  180 capsule  2  . glipiZIDE-metformin (METAGLIP) 2.5-500 MG per tablet TAKE 2 TABLETS BY MOUTH TWICE DAILY BEFORE MEALS  360 tablet  0  . glucose blood (ONE TOUCH TEST STRIPS) test strip Check blood sugar three times daily and as directed. Dx 250.00  270 each  3  . LANTUS SOLOSTAR 100 UNIT/ML SOPN INJECT 46 UNITS UNDER THE SKIN DAILY  45 mL  0  .  levothyroxine (SYNTHROID, LEVOTHROID) 175 MCG tablet Take 1 tablet (175 mcg total) by mouth daily before breakfast.  90 tablet  1  . lisinopril (PRINIVIL,ZESTRIL) 5 MG tablet TAKE 1 TABLET BY MOUTH EVERY DAY  30 tablet  2  . ONE TOUCH LANCETS MISC Check blood sugar three times daily and as directed.Dx 250.00  270 each  3  . Oxycodone HCl 10 MG TABS 1 tab by mouth every eight hours as needed for pain.  90 tablet  0  . pantoprazole (PROTONIX) 40 MG tablet TAKE 1 TABLET BY MOUTH EVERY DAY  90 tablet  0  . traMADol (ULTRAM) 50 MG tablet TAKE 2 TABLETS BY MOUTH FOUR TIMES DAILY  240 tablet  0  . VICTOZA 18 MG/3ML SOPN INJECT 1.8 MG EVERY DAY AS DIRECTED  9 mL  0  . [DISCONTINUED] TAZTIA XT 180 MG 24 hr capsule TAKE ONE CAPSULE BY MOUTH EVERY DAY  90 capsule  0   No current facility-administered medications for  this visit.    Allergies  Allergen Reactions  . Byetta 10 Mcg Pen [Exenatide] Nausea And Vomiting  . Quetiapine     Family History  Problem Relation Age of Onset  . Hypertension Mother   . Diabetes Mother   . Bipolar disorder Father   . Colon cancer Paternal Aunt   . Colon cancer Paternal Grandmother     History   Social History  . Marital Status: Single    Spouse Name: N/A    Number of Children: 3  . Years of Education: N/A   Occupational History  . disability    .     Social History Main Topics  . Smoking status: Never Smoker   . Smokeless tobacco: Never Used  . Alcohol Use: No  . Drug Use: No  . Sexual Activity: Not on file   Other Topics Concern  . Not on file   Social History Narrative   Has a living will- desires CPR but no prolonged life support if futile.     Constitutional: Denies fever, malaise, fatigue, headache or abrupt weight changes.   Skin: Pt reports rash on left hand. Denies lesions or ulcercations.    No other specific complaints in a complete review of systems (except as listed in HPI above).  Objective:   Physical Exam  BP 128/86   Pulse 77  Temp(Src) 98.1 F (36.7 C) (Oral)  Wt 184 lb (83.462 kg)  SpO2 98% Wt Readings from Last 3 Encounters:  11/03/13 184 lb (83.462 kg)  10/13/13 181 lb (82.101 kg)  08/26/13 186 lb 8 oz (84.596 kg)    General: Appears her stated age, well developed, well nourished in NAD. Skin: Large (half dollar) circular lesion on left wrist with central clearing. Pustules noted around the edge of the lesion, appear to be filled with white fluid.   BMET    Component Value Date/Time   NA 138 07/05/2013 1109   K 4.6 07/05/2013 1109   CL 99 07/05/2013 1109   CO2 29 07/05/2013 1109   GLUCOSE 163* 07/05/2013 1109   BUN 10 07/05/2013 1109   CREATININE 1.0 07/05/2013 1109   CALCIUM 9.6 07/05/2013 1109   GFRNONAA 76.49 01/01/2010 0926   GFRAA  Value: >60        The eGFR has been calculated using the MDRD equation. This calculation has not been validated in all clinical situations. eGFR's persistently <60 mL/min signify possible Chronic Kidney Disease. 10/25/2008 0351    Lipid Panel     Component Value Date/Time   CHOL 250* 07/05/2013 1109   TRIG 618.0* 07/05/2013 1109   HDL 46.20 07/05/2013 1109   CHOLHDL 5 07/05/2013 1109   VLDL 123.6* 07/05/2013 1109   LDLCALC 80 07/05/2013 1109    CBC    Component Value Date/Time   WBC 10.3 10/06/2011 1144   RBC 4.30 10/06/2011 1144   HGB 13.1 10/06/2011 1144   HCT 39.0 10/06/2011 1144   PLT 275.0 10/06/2011 1144   MCV 90.8 10/06/2011 1144   MCHC 33.7 10/06/2011 1144   RDW 13.0 10/06/2011 1144   LYMPHSABS 4.1* 10/06/2011 1144   MONOABS 0.6 10/06/2011 1144   EOSABS 0.2 10/06/2011 1144   BASOSABS 0.0 10/06/2011 1144    Hgb A1C Lab Results  Component Value Date   HGBA1C 8.7* 07/05/2013         Assessment & Plan:   Ringworm:  Consulted with Dr. Alphonsus Sias He said to try oral Diflucan vs ketoconazole Diflucan 100 mg daily  x 7 days  RTC as needed if symptoms persist or worsen

## 2013-11-07 ENCOUNTER — Other Ambulatory Visit: Payer: Self-pay | Admitting: Family Medicine

## 2013-11-11 ENCOUNTER — Other Ambulatory Visit: Payer: Self-pay | Admitting: Family Medicine

## 2013-11-16 ENCOUNTER — Ambulatory Visit: Payer: Medicare Other | Admitting: Family Medicine

## 2013-11-16 ENCOUNTER — Encounter: Payer: Self-pay | Admitting: Family Medicine

## 2013-11-20 ENCOUNTER — Other Ambulatory Visit: Payer: Self-pay | Admitting: Internal Medicine

## 2013-11-24 ENCOUNTER — Other Ambulatory Visit: Payer: Self-pay | Admitting: Family Medicine

## 2013-11-25 ENCOUNTER — Encounter: Payer: Self-pay | Admitting: Family Medicine

## 2013-11-25 ENCOUNTER — Ambulatory Visit (INDEPENDENT_AMBULATORY_CARE_PROVIDER_SITE_OTHER): Payer: Medicare Other | Admitting: Family Medicine

## 2013-11-25 VITALS — BP 124/72 | HR 78 | Temp 98.2°F | Wt 181.8 lb

## 2013-11-25 DIAGNOSIS — B354 Tinea corporis: Secondary | ICD-10-CM | POA: Insufficient documentation

## 2013-11-25 MED ORDER — TERBINAFINE HCL 250 MG PO TABS: 250.0000 mg | ORAL_TABLET | Freq: Every day | ORAL | Status: AC

## 2013-11-25 NOTE — Patient Instructions (Signed)
Great to see you. Please take Terbinafine 250 mg daily x 2 weeks. Please come see me if no improvement.

## 2013-11-25 NOTE — Progress Notes (Signed)
Subjective:   Patient ID: Lori Phillips, female    DOB: Aug 23, 1955, 58 y.o.   MRN: [id number]  Lori Phillips is a pleasant 58 y.o. year old female who presents to clinic today with Follow-up  on 11/25/2013  HPI: Follow up- saw Webb Silversmith on 7/16 for ? Ringworm.  Note reviewed.  Given 7 day course of oral diflucan.  Symptoms improved but started getting more red and itchy once she completed the course of diflucan.  Gets really itchy when she gets hot.  Current Outpatient Prescriptions on File Prior to Visit  Medication Sig Dispense Refill  . ABILIFY 10 MG tablet Take 1 tablet by mouth once.      Marland Kitchen aspirin 81 MG tablet Take 81 mg by mouth daily.      . B-D ULTRAFINE III SHORT PEN 31G X 8 MM MISC USE AS DIRECTED  100 each  5  . Blood Glucose Monitoring Suppl (ONE TOUCH BASIC SYSTEM) W/DEVICE KIT Check blood sugar three times a day and as directed. Dx 250.00  1 each  0  . CARTIA XT 180 MG 24 hr capsule TAKE 1 CAPSULE BY MOUTH EVERY DAY  90 capsule  1  . clonazePAM (KLONOPIN) 0.5 MG tablet Take 1 tablet by mouth once.      . CRESTOR 20 MG tablet TAKE 1 TABLET BY MOUTH EVERY DAY  90 tablet  0  . divalproex (DEPAKOTE ER) 500 MG 24 hr tablet TAKE 1 TABLET BY MOUTH TWICE DAILY  60 tablet  0  . DULoxetine (CYMBALTA) 60 MG capsule TAKE 1 CAPSULE BY MOUTH ONCE DAILY  90 capsule  3  . gabapentin (NEURONTIN) 100 MG capsule Take 2 capsules (200 mg total) by mouth 3 (three) times daily.  180 capsule  2  . glipiZIDE-metformin (METAGLIP) 2.5-500 MG per tablet TAKE 2 TABLETS BY MOUTH TWICE DAILY BEFORE MEALS  360 tablet  0  . glucose blood (ONE TOUCH TEST STRIPS) test strip Check blood sugar three times daily and as directed. Dx 250.00  270 each  3  . LANTUS SOLOSTAR 100 UNIT/ML SOPN INJECT 46 UNITS UNDER THE SKIN DAILY  45 mL  0  . levothyroxine (SYNTHROID, LEVOTHROID) 175 MCG tablet Take 1 tablet (175 mcg total) by mouth daily before breakfast.  90 tablet  1  . lisinopril (PRINIVIL,ZESTRIL) 5 MG  tablet TAKE 1 TABLET BY MOUTH EVERY DAY  30 tablet  2  . ONE TOUCH LANCETS MISC Check blood sugar three times daily and as directed.Dx 250.00  270 each  3  . Oxycodone HCl 10 MG TABS 1 tab by mouth every eight hours as needed for pain.  90 tablet  0  . pantoprazole (PROTONIX) 40 MG tablet TAKE ONE TABLET BY MOUTH EVERY DAY  90 tablet  0  . traMADol (ULTRAM) 50 MG tablet TAKE 2 TABLETS BY MOUTH FOUR TIMES DAILY  240 tablet  0  . VICTOZA 18 MG/3ML SOPN INJECT 1.8 MG SUBCUTANEOUSLY EVERY DAY AS DIRECTED  9 mL  3  . [DISCONTINUED] TAZTIA XT 180 MG 24 hr capsule TAKE ONE CAPSULE BY MOUTH EVERY DAY  90 capsule  0   No current facility-administered medications on file prior to visit.    Allergies  Allergen Reactions  . Byetta 10 Mcg Pen [Exenatide] Nausea And Vomiting  . Quetiapine     Past Medical History  Diagnosis Date  . CAD (coronary artery disease)     multiple caths  . Diabetes mellitus   .  Hyperlipidemia   . Hypertension   . Thyroid disease     hypothyroid  . Low back pain   . Nephrolithiasis   . Fibromyalgia   . Bipolar disorder   . Anxiety     Past Surgical History  Procedure Laterality Date  . Vaginal hysterectomy    . Tonsillectomy    . Colonoscopy  multiple    2005 [place] records not available-polyps x 3    Family History  Problem Relation Age of Onset  . Hypertension Mother   . Diabetes Mother   . Bipolar disorder Father   . Colon cancer Paternal Aunt   . Colon cancer Paternal Grandmother     History   Social History  . Marital Status: Single    Spouse Name: N/A    Number of Children: 3  . Years of Education: N/A   Occupational History  . disability    .     Social History Main Topics  . Smoking status: Never Smoker   . Smokeless tobacco: Never Used  . Alcohol Use: No  . Drug Use: No  . Sexual Activity: Not on file   Other Topics Concern  . Not on file   Social History Narrative   Has a living will- desires CPR but no prolonged life  support if futile.   The PMH, PSH, Social History, Family History, Medications, and allergies have been reviewed in [facility], and have been updated if relevant.   Review of Systems See HPI No fevers No drainage from hand    Objective:    BP 124/72  Pulse 78  Temp(Src) 98.2 F (36.8 C) (Oral)  Wt 181 lb 12 oz (82.441 kg)  SpO2 95%   Physical Exam  General: Alert, pleasant, well developed, well nourished in NAD.  Skin: Large circular lesion on left wrist with central clearing.No pustules today     Assessment & Plan:   No diagnosis found. No Follow-up on file.

## 2013-11-25 NOTE — Assessment & Plan Note (Signed)
Improved but persistent. Will start two week course of oral terbinafine 250 mg daily. eRx sent. Call or return to clinic prn if these symptoms worsen or fail to improve as anticipated. The patient indicates understanding of these issues and agrees with the plan.

## 2013-11-25 NOTE — Progress Notes (Signed)
Pre visit review using our clinic review tool, if applicable. No additional management support is needed unless otherwise documented below in the visit note. 

## 2013-11-28 NOTE — Telephone Encounter (Signed)
Pt requesting medication refill. Last f/u appt 11/2013 with no future appts scheduled. pls advise

## 2013-11-28 NOTE — Telephone Encounter (Signed)
Rx called in to requested pharmacy 

## 2013-12-01 ENCOUNTER — Ambulatory Visit: Payer: Self-pay | Admitting: Pain Medicine

## 2013-12-01 ENCOUNTER — Other Ambulatory Visit: Payer: Self-pay

## 2013-12-01 ENCOUNTER — Encounter: Payer: Self-pay | Admitting: Family Medicine

## 2013-12-01 MED ORDER — OXYCODONE HCL 10 MG PO TABS: ORAL_TABLET | ORAL | Status: DC

## 2013-12-01 NOTE — Telephone Encounter (Signed)
Pt left v/m requesting rx oxycodone. Call when ready for pick up. 

## 2013-12-01 NOTE — Telephone Encounter (Signed)
Spoke to pt and informed her Rx is available for pick up at the front desk 

## 2013-12-12 ENCOUNTER — Ambulatory Visit: Payer: Self-pay | Admitting: Pain Medicine

## 2013-12-15 ENCOUNTER — Other Ambulatory Visit: Payer: Self-pay | Admitting: Internal Medicine

## 2013-12-16 ENCOUNTER — Other Ambulatory Visit: Payer: Self-pay | Admitting: *Deleted

## 2013-12-16 MED ORDER — INSULIN GLARGINE 100 UNIT/ML SOLOSTAR PEN: PEN_INJECTOR | SUBCUTANEOUS | Status: DC

## 2013-12-16 NOTE — Telephone Encounter (Signed)
Last filled 03/2013--with no refills--she is not your pt and looks like she has seen Dr Dellie Burns  advise

## 2013-12-16 NOTE — Telephone Encounter (Signed)
Pt is endo pt of Dr. Pablo Ledger. Will send to her CMA

## 2013-12-19 NOTE — Telephone Encounter (Signed)
Please refer to msg below 

## 2013-12-20 ENCOUNTER — Other Ambulatory Visit: Payer: Self-pay | Admitting: Family Medicine

## 2013-12-20 ENCOUNTER — Encounter: Payer: Self-pay | Admitting: Family Medicine

## 2013-12-31 ENCOUNTER — Other Ambulatory Visit: Payer: Self-pay | Admitting: Family Medicine

## 2014-01-02 ENCOUNTER — Other Ambulatory Visit: Payer: Self-pay

## 2014-01-02 MED ORDER — OXYCODONE HCL 10 MG PO TABS: ORAL_TABLET | ORAL | Status: DC

## 2014-01-02 MED ORDER — TRAMADOL HCL 50 MG PO TABS: ORAL_TABLET | ORAL | Status: DC

## 2014-01-02 NOTE — Telephone Encounter (Signed)
Pt left v/m requesting rx oxycodone apap. Call when ready for pick up. Pt also request refill tramadol to [facility].

## 2014-01-02 NOTE — Telephone Encounter (Signed)
Spoke to pt and informed her Rx is available for pickup; 2nd Rx called in to requested pharmacy

## 2014-01-03 ENCOUNTER — Encounter: Payer: Self-pay | Admitting: Family Medicine

## 2014-01-10 ENCOUNTER — Ambulatory Visit: Payer: Self-pay | Admitting: Pain Medicine

## 2014-01-16 ENCOUNTER — Ambulatory Visit: Payer: Self-pay | Admitting: Pain Medicine

## 2014-01-19 ENCOUNTER — Other Ambulatory Visit: Payer: Self-pay | Admitting: Family Medicine

## 2014-01-20 ENCOUNTER — Encounter: Payer: Self-pay | Admitting: Family Medicine

## 2014-01-20 ENCOUNTER — Other Ambulatory Visit: Payer: Self-pay | Admitting: Family Medicine

## 2014-01-26 ENCOUNTER — Telehealth: Payer: Self-pay

## 2014-01-26 NOTE — Telephone Encounter (Signed)
Pt stated that she has not had an exam this year or last year, but plans to schedule one before the end of this year.

## 2014-01-31 ENCOUNTER — Other Ambulatory Visit: Payer: Self-pay

## 2014-01-31 NOTE — Telephone Encounter (Signed)
Pt left v/m requesting rx oxycodone. Call when ready for pick up. Pt request refill tramadol to [facility].Please advise.

## 2014-01-31 NOTE — Telephone Encounter (Signed)
Spoke to pt and advised per Dr Aron; pt verbally expressed understanding.  

## 2014-02-01 ENCOUNTER — Telehealth: Payer: Self-pay | Admitting: Family Medicine

## 2014-02-01 MED ORDER — OXYCODONE HCL 10 MG PO TABS: ORAL_TABLET | ORAL | Status: DC

## 2014-02-02 NOTE — Telephone Encounter (Signed)
Pt left v/m requesting status of oxycodone rx. Pt request cb when can pick up rx.

## 2014-02-02 NOTE — Telephone Encounter (Signed)
Lm on pts vm and informed her Rx is available for pickup at the front desk. Pt advised this is last controlled Rx Dr Deborra Medina will provide; requested by pain mngt to provide for 1 additional month; pt is now seeing the pain clinic

## 2014-02-03 ENCOUNTER — Other Ambulatory Visit: Payer: Self-pay | Admitting: Family Medicine

## 2014-02-13 ENCOUNTER — Ambulatory Visit: Payer: Self-pay | Admitting: Pain Medicine

## 2014-02-22 ENCOUNTER — Ambulatory Visit: Payer: Self-pay | Admitting: Pain Medicine

## 2014-03-02 ENCOUNTER — Ambulatory Visit: Payer: Self-pay | Admitting: Pain Medicine

## 2014-03-06 ENCOUNTER — Encounter: Payer: Self-pay | Admitting: Family Medicine

## 2014-03-06 ENCOUNTER — Ambulatory Visit (INDEPENDENT_AMBULATORY_CARE_PROVIDER_SITE_OTHER): Payer: Medicare Other | Admitting: Family Medicine

## 2014-03-06 VITALS — BP 122/78 | HR 107 | Temp 97.8°F | Wt 177.2 lb

## 2014-03-06 DIAGNOSIS — G894 Chronic pain syndrome: Secondary | ICD-10-CM

## 2014-03-06 NOTE — Assessment & Plan Note (Signed)
Followed by Dr. Primus Bravo. Yes, I feel she can stop ASA 5 days prior if necessary to procedure. Form will be faxed back to him.

## 2014-03-06 NOTE — Progress Notes (Signed)
Subjective:   Patient ID: Lori Phillips, female    DOB: Sep 08, 1955, 58 y.o.   MRN: [id number]  Lori Phillips is a pleasant 58 y.o. year old female who presents to clinic today with surgical clearance  on 03/06/2014  HPI: Received a note from her pain management doctor asking if she could stop ASA 5 days prior to procedure.  Procedure to be done was not listed so I advised her to come in here today to see me.  On 11/23, she is having an epidural injection.  Per pt, has had multiple done in past and has stopped ASA 5 days prior without issue.    She does have h/o CAD, DM.    No problems with bleeding or other complications from previous injections.  Current Outpatient Prescriptions on File Prior to Visit  Medication Sig Dispense Refill  . ABILIFY 10 MG tablet Take 1 tablet by mouth once.    Marland Kitchen aspirin 81 MG tablet Take 81 mg by mouth daily.    . B-D ULTRAFINE III SHORT PEN 31G X 8 MM MISC USE AS DIRECTED 100 each 5  . Blood Glucose Monitoring Suppl (ONE TOUCH BASIC SYSTEM) W/DEVICE KIT Check blood sugar three times a day and as directed. Dx 250.00 1 each 0  . CARTIA XT 180 MG 24 hr capsule TAKE 1 CAPSULE BY MOUTH EVERY DAY 90 capsule 1  . clonazePAM (KLONOPIN) 0.5 MG tablet Take 1 tablet by mouth once.    . CRESTOR 20 MG tablet TAKE 1 TABLET BY MOUTH EVERY DAY 90 tablet 0  . divalproex (DEPAKOTE ER) 500 MG 24 hr tablet TAKE 1 TABLET BY MOUTH TWICE DAILY 60 tablet 0  . DULoxetine (CYMBALTA) 60 MG capsule TAKE 1 CAPSULE BY MOUTH ONCE DAILY 90 capsule 3  . gabapentin (NEURONTIN) 100 MG capsule Take 2 capsules (200 mg total) by mouth 3 (three) times daily. 180 capsule 2  . glipiZIDE-metformin (METAGLIP) 2.5-500 MG per tablet TAKE 2 TABLETS BY MOUTH TWICE DAILY BEFORE MEALS 360 tablet 0  . glucose blood (ONE TOUCH TEST STRIPS) test strip Check blood sugar three times daily and as directed. Dx 250.00 270 each 3  . Insulin Glargine (LANTUS SOLOSTAR) 100 UNIT/ML Solostar Pen Inject 35 units  into the skin at bedtime. 15 mL 1  . levothyroxine (SYNTHROID, LEVOTHROID) 175 MCG tablet TAKE 1 TABLET BY MOUTH DAILY BEFORE BREAKFAST 90 tablet 0  . lisinopril (PRINIVIL,ZESTRIL) 5 MG tablet TAKE 1 TABLET BY MOUTH EVERY DAY 30 tablet 2  . ONE TOUCH LANCETS MISC Check blood sugar three times daily and as directed.Dx 250.00 270 each 3  . Oxycodone HCl 10 MG TABS 1 tab by mouth every eight hours as needed for pain. 90 tablet 0  . pantoprazole (PROTONIX) 40 MG tablet TAKE 1 TABLET BY MOUTH EVERY DAY 90 tablet 0  . traMADol (ULTRAM) 50 MG tablet TAKE 2 TABLETS BY MOUTH FOUR TIMES DAILY. 240 tablet 0  . VICTOZA 18 MG/3ML SOPN INJECT 1.8 MG SUBCUTANEOUSLY EVERY DAY AS DIRECTED 9 mL 3  . [DISCONTINUED] TAZTIA XT 180 MG 24 hr capsule TAKE ONE CAPSULE BY MOUTH EVERY DAY 90 capsule 0   No current facility-administered medications on file prior to visit.    Allergies  Allergen Reactions  . Byetta 10 Mcg Pen [Exenatide] Nausea And Vomiting  . Quetiapine     Past Medical History  Diagnosis Date  . CAD (coronary artery disease)     multiple caths  . Diabetes  mellitus   . Hyperlipidemia   . Hypertension   . Thyroid disease     hypothyroid  . Low back pain   . Nephrolithiasis   . Fibromyalgia   . Bipolar disorder   . Anxiety     Past Surgical History  Procedure Laterality Date  . Vaginal hysterectomy    . Tonsillectomy    . Colonoscopy  multiple    2005 [place] records not available-polyps x 3    Family History  Problem Relation Age of Onset  . Hypertension Mother   . Diabetes Mother   . Bipolar disorder Father   . Colon cancer Paternal Aunt   . Colon cancer Paternal Grandmother     History   Social History  . Marital Status: Single    Spouse Name: N/A    Number of Children: 3  . Years of Education: N/A   Occupational History  . disability    .     Social History Main Topics  . Smoking status: Never Smoker   . Smokeless tobacco: Never Used  . Alcohol Use: No    . Drug Use: No  . Sexual Activity: Not on file   Other Topics Concern  . Not on file   Social History Narrative   Has a living will- desires CPR but no prolonged life support if futile.   The PMH, PSH, Social History, Family History, Medications, and allergies have been reviewed in [facility], and have been updated if relevant.   Review of Systems    See HPI Objective:    BP 122/78 mmHg  Pulse 107  Temp(Src) 97.8 F (36.6 C) (Oral)  Wt 177 lb 4 oz (80.4 kg)  SpO2 93%   Physical Exam  Constitutional: She appears well-developed and well-nourished. No distress.  Skin: Skin is warm and dry.  Psychiatric: She has a normal mood and affect. Her behavior is normal. Judgment and thought content normal.          Assessment & Plan:   Chronic pain syndrome No Follow-up on file.

## 2014-03-06 NOTE — Progress Notes (Signed)
Pre visit review using our clinic review tool, if applicable. No additional management support is needed unless otherwise documented below in the visit note. 

## 2014-03-07 ENCOUNTER — Other Ambulatory Visit: Payer: Self-pay | Admitting: Internal Medicine

## 2014-03-07 NOTE — Telephone Encounter (Signed)
Needs appt for further refills.

## 2014-03-13 ENCOUNTER — Telehealth: Payer: Self-pay

## 2014-03-13 ENCOUNTER — Ambulatory Visit: Payer: Self-pay | Admitting: Pain Medicine

## 2014-03-13 NOTE — Telephone Encounter (Signed)
I called pt and she would like a different pain mgt doctor but pt does not want to see anyone at [facility] and pt does not want to drive to [place].Please advise. Pt was aware her daughter called earlier and I advised pt if she wants [place] to be able to talk with her daughter about her care she will need to sign DPR. Pt voiced understanding.

## 2014-03-13 NOTE — Telephone Encounter (Signed)
Rosaria Ferries, what do you suggest?

## 2014-03-13 NOTE — Telephone Encounter (Signed)
Lori Phillips pts daughter wants to discuss with Dr Deborra Medina about care receiving at pain mgt; pt is now walking with walker, pt is shaky and does not seem to be doing as well since starting pain mgt. Amber request cb.

## 2014-03-13 NOTE — Telephone Encounter (Signed)
In future Lori Phillips, please list if caller is DPR.  Attempted to return call.  No answer.  Please try to call her later this pm- does she want [place] to refer her to another pain management MD?  She may be over medicated.

## 2014-03-13 NOTE — Telephone Encounter (Signed)
Pt is not on DPR

## 2014-03-13 NOTE — Telephone Encounter (Signed)
[facility] is the only Pain Clinic in [place]. [place] would be the only option.

## 2014-03-15 ENCOUNTER — Ambulatory Visit: Payer: Self-pay | Admitting: Pain Medicine

## 2014-03-16 ENCOUNTER — Other Ambulatory Visit: Payer: Self-pay | Admitting: Family Medicine

## 2014-03-28 ENCOUNTER — Other Ambulatory Visit: Payer: Self-pay | Admitting: Family Medicine

## 2014-03-29 ENCOUNTER — Other Ambulatory Visit: Payer: Self-pay | Admitting: Family Medicine

## 2014-03-29 NOTE — Telephone Encounter (Signed)
Pt requesting medication refill. Last f/u appt 11/2013. pls advise

## 2014-03-30 ENCOUNTER — Ambulatory Visit: Payer: Self-pay | Admitting: Pain Medicine

## 2014-04-03 ENCOUNTER — Other Ambulatory Visit: Payer: Self-pay | Admitting: Family Medicine

## 2014-04-15 ENCOUNTER — Other Ambulatory Visit: Payer: Self-pay | Admitting: Internal Medicine

## 2014-04-17 ENCOUNTER — Ambulatory Visit: Payer: Self-pay | Admitting: Pain Medicine

## 2014-04-19 ENCOUNTER — Other Ambulatory Visit: Payer: Self-pay | Admitting: Family Medicine

## 2014-04-21 DIAGNOSIS — G20 Parkinson's disease: Secondary | ICD-10-CM

## 2014-04-21 DIAGNOSIS — G20C Parkinsonism, unspecified: Secondary | ICD-10-CM

## 2014-04-21 HISTORY — DX: Parkinson's disease: G20

## 2014-04-21 HISTORY — DX: Parkinsonism, unspecified: G20.C

## 2014-05-01 ENCOUNTER — Ambulatory Visit: Payer: Self-pay | Admitting: Pain Medicine

## 2014-05-01 DIAGNOSIS — M4184 Other forms of scoliosis, thoracic region: Secondary | ICD-10-CM | POA: Diagnosis not present

## 2014-05-01 DIAGNOSIS — M47817 Spondylosis without myelopathy or radiculopathy, lumbosacral region: Secondary | ICD-10-CM | POA: Diagnosis not present

## 2014-05-01 DIAGNOSIS — G894 Chronic pain syndrome: Secondary | ICD-10-CM | POA: Diagnosis not present

## 2014-05-01 DIAGNOSIS — M47896 Other spondylosis, lumbar region: Secondary | ICD-10-CM | POA: Diagnosis not present

## 2014-05-01 DIAGNOSIS — M5134 Other intervertebral disc degeneration, thoracic region: Secondary | ICD-10-CM | POA: Diagnosis not present

## 2014-05-01 DIAGNOSIS — R0782 Intercostal pain: Secondary | ICD-10-CM | POA: Diagnosis not present

## 2014-05-01 DIAGNOSIS — F1120 Opioid dependence, uncomplicated: Secondary | ICD-10-CM | POA: Diagnosis not present

## 2014-05-01 DIAGNOSIS — Z79899 Other long term (current) drug therapy: Secondary | ICD-10-CM | POA: Diagnosis not present

## 2014-05-01 DIAGNOSIS — M546 Pain in thoracic spine: Secondary | ICD-10-CM | POA: Diagnosis not present

## 2014-05-01 DIAGNOSIS — Z79891 Long term (current) use of opiate analgesic: Secondary | ICD-10-CM | POA: Diagnosis not present

## 2014-05-01 DIAGNOSIS — M5416 Radiculopathy, lumbar region: Secondary | ICD-10-CM | POA: Diagnosis not present

## 2014-05-01 DIAGNOSIS — M461 Sacroiliitis, not elsewhere classified: Secondary | ICD-10-CM | POA: Diagnosis not present

## 2014-05-02 ENCOUNTER — Other Ambulatory Visit: Payer: Medicare Other

## 2014-05-08 ENCOUNTER — Other Ambulatory Visit: Payer: Self-pay | Admitting: Family Medicine

## 2014-05-25 ENCOUNTER — Other Ambulatory Visit: Payer: Self-pay | Admitting: Internal Medicine

## 2014-05-25 NOTE — Telephone Encounter (Signed)
Need appt for further refills

## 2014-05-31 ENCOUNTER — Ambulatory Visit: Payer: Self-pay | Admitting: Pain Medicine

## 2014-05-31 DIAGNOSIS — M47817 Spondylosis without myelopathy or radiculopathy, lumbosacral region: Secondary | ICD-10-CM | POA: Diagnosis not present

## 2014-05-31 DIAGNOSIS — F319 Bipolar disorder, unspecified: Secondary | ICD-10-CM | POA: Diagnosis not present

## 2014-05-31 DIAGNOSIS — F410 Panic disorder [episodic paroxysmal anxiety] without agoraphobia: Secondary | ICD-10-CM | POA: Diagnosis not present

## 2014-05-31 DIAGNOSIS — E039 Hypothyroidism, unspecified: Secondary | ICD-10-CM | POA: Diagnosis not present

## 2014-05-31 DIAGNOSIS — M797 Fibromyalgia: Secondary | ICD-10-CM | POA: Diagnosis not present

## 2014-05-31 DIAGNOSIS — E119 Type 2 diabetes mellitus without complications: Secondary | ICD-10-CM | POA: Diagnosis not present

## 2014-05-31 DIAGNOSIS — I10 Essential (primary) hypertension: Secondary | ICD-10-CM | POA: Diagnosis not present

## 2014-05-31 DIAGNOSIS — M810 Age-related osteoporosis without current pathological fracture: Secondary | ICD-10-CM | POA: Diagnosis not present

## 2014-05-31 DIAGNOSIS — M545 Low back pain: Secondary | ICD-10-CM | POA: Diagnosis not present

## 2014-06-16 ENCOUNTER — Other Ambulatory Visit: Payer: Self-pay | Admitting: Family Medicine

## 2014-06-25 ENCOUNTER — Other Ambulatory Visit: Payer: Self-pay | Admitting: Family Medicine

## 2014-06-25 ENCOUNTER — Other Ambulatory Visit: Payer: Self-pay | Admitting: Internal Medicine

## 2014-06-26 NOTE — Telephone Encounter (Signed)
Needs appt for further refills.

## 2014-06-29 ENCOUNTER — Ambulatory Visit: Payer: Self-pay | Admitting: Pain Medicine

## 2014-06-29 ENCOUNTER — Other Ambulatory Visit: Payer: Self-pay | Admitting: Family Medicine

## 2014-06-29 DIAGNOSIS — M419 Scoliosis, unspecified: Secondary | ICD-10-CM | POA: Diagnosis not present

## 2014-06-29 DIAGNOSIS — M810 Age-related osteoporosis without current pathological fracture: Secondary | ICD-10-CM | POA: Diagnosis not present

## 2014-06-29 DIAGNOSIS — E039 Hypothyroidism, unspecified: Secondary | ICD-10-CM | POA: Diagnosis not present

## 2014-06-29 DIAGNOSIS — I10 Essential (primary) hypertension: Secondary | ICD-10-CM | POA: Diagnosis not present

## 2014-06-29 DIAGNOSIS — M792 Neuralgia and neuritis, unspecified: Secondary | ICD-10-CM | POA: Diagnosis not present

## 2014-06-29 DIAGNOSIS — M545 Low back pain: Secondary | ICD-10-CM | POA: Diagnosis not present

## 2014-06-29 DIAGNOSIS — M5416 Radiculopathy, lumbar region: Secondary | ICD-10-CM | POA: Diagnosis not present

## 2014-06-29 DIAGNOSIS — M797 Fibromyalgia: Secondary | ICD-10-CM | POA: Diagnosis not present

## 2014-06-29 DIAGNOSIS — M461 Sacroiliitis, not elsewhere classified: Secondary | ICD-10-CM | POA: Diagnosis not present

## 2014-06-29 DIAGNOSIS — E119 Type 2 diabetes mellitus without complications: Secondary | ICD-10-CM | POA: Diagnosis not present

## 2014-06-29 DIAGNOSIS — M069 Rheumatoid arthritis, unspecified: Secondary | ICD-10-CM | POA: Diagnosis not present

## 2014-06-29 DIAGNOSIS — M47817 Spondylosis without myelopathy or radiculopathy, lumbosacral region: Secondary | ICD-10-CM | POA: Diagnosis not present

## 2014-07-03 ENCOUNTER — Telehealth: Payer: Self-pay | Admitting: Family Medicine

## 2014-07-03 NOTE — Telephone Encounter (Signed)
[facility] Patient Name: Lori Phillips DOB: Dec 22, 1955 Initial Comment Caller states she has a droopy mouth, incontinent, can hardly walk, and shakes all the time. THinks she may have had a stroke Nurse Assessment Nurse: Ronnald Ramp, RN, Miranda Date/Time (Eastern Time): 07/03/2014 9:59:39 AM Confirm and document reason for call. If symptomatic, describe symptoms. ---Caller states she is having weakness and needing to use a walker and feels shaky. She feels like her mouth is droopy on the left side (unsure for how long). Her daughter noticed her mouth drooping on Thursday. Also having trouble controlling her bladder and bowels. She is not monitoring her BS. Has the patient traveled out of the country within the last 30 days? ---Not Applicable Does the patient require triage? ---Yes Related visit to physician within the last 2 weeks? ---No Does the PT have any chronic conditions? (i.e. diabetes, asthma, etc.) ---Yes List chronic conditions. ---Thyroid, Diabetes, Bi-polar, HTN, High cholesterol Guidelines Guideline Title Affirmed Question Affirmed Notes Weakness (Generalized) and Fatigue [1] MODERATE weakness (i.e., interferes with work, school, normal activities) AND [2] persists > 3 days Final Disposition User See Physician within 24 Hours Jones, Therapist, sports, [employer] states she cannot drive. She will need to get intouch with her daughter to find out when she can drive her to the office. She will call back to scheduled the appt.

## 2014-07-03 NOTE — Telephone Encounter (Signed)
Pt already has appt on 07/06/14 at 12:45.Please advise.

## 2014-07-04 ENCOUNTER — Encounter: Payer: Self-pay | Admitting: Family Medicine

## 2014-07-04 ENCOUNTER — Ambulatory Visit (INDEPENDENT_AMBULATORY_CARE_PROVIDER_SITE_OTHER): Payer: Medicare Other | Admitting: Family Medicine

## 2014-07-04 ENCOUNTER — Telehealth: Payer: Self-pay

## 2014-07-04 VITALS — BP 146/80 | HR 81 | Temp 97.5°F | Wt 175.8 lb

## 2014-07-04 DIAGNOSIS — E038 Other specified hypothyroidism: Secondary | ICD-10-CM

## 2014-07-04 DIAGNOSIS — Z79899 Other long term (current) drug therapy: Secondary | ICD-10-CM | POA: Diagnosis not present

## 2014-07-04 DIAGNOSIS — M6281 Muscle weakness (generalized): Secondary | ICD-10-CM

## 2014-07-04 DIAGNOSIS — R531 Weakness: Secondary | ICD-10-CM | POA: Insufficient documentation

## 2014-07-04 LAB — CBC WITH DIFFERENTIAL/PLATELET
BASOS PCT: 0.5 % (ref 0.0–3.0)
Basophils Absolute: 0.1 10*3/uL (ref 0.0–0.1)
Eosinophils Absolute: 0.1 10*3/uL (ref 0.0–0.7)
Eosinophils Relative: 0.6 % (ref 0.0–5.0)
HCT: 41.8 % (ref 36.0–46.0)
Hemoglobin: 13.9 g/dL (ref 12.0–15.0)
Lymphocytes Relative: 32.6 % (ref 12.0–46.0)
Lymphs Abs: 4 10*3/uL (ref 0.7–4.0)
MCHC: 33.3 g/dL (ref 30.0–36.0)
MCV: 89.2 fl (ref 78.0–100.0)
MONO ABS: 1 10*3/uL (ref 0.1–1.0)
Monocytes Relative: 7.9 % (ref 3.0–12.0)
NEUTROS PCT: 58.4 % (ref 43.0–77.0)
Neutro Abs: 7.1 10*3/uL (ref 1.4–7.7)
Platelets: 239 10*3/uL (ref 150.0–400.0)
RBC: 4.69 Mil/uL (ref 3.87–5.11)
RDW: 14.1 % (ref 11.5–15.5)
WBC: 12.1 10*3/uL — ABNORMAL HIGH (ref 4.0–10.5)

## 2014-07-04 LAB — COMPREHENSIVE METABOLIC PANEL
ALBUMIN: 4.3 g/dL (ref 3.5–5.2)
ALT: 7 U/L (ref 0–35)
AST: 12 U/L (ref 0–37)
Alkaline Phosphatase: 64 U/L (ref 39–117)
BUN: 9 mg/dL (ref 6–23)
CALCIUM: 10.4 mg/dL (ref 8.4–10.5)
CHLORIDE: 104 meq/L (ref 96–112)
CO2: 30 meq/L (ref 19–32)
CREATININE: 0.88 mg/dL (ref 0.40–1.20)
GFR: 69.41 mL/min (ref >60.00)
Glucose, Bld: 61 mg/dL — ABNORMAL LOW (ref 70–99)
Potassium: 3.8 mEq/L (ref 3.5–5.1)
Sodium: 141 mEq/L (ref 135–145)
Total Bilirubin: 0.3 mg/dL (ref 0.2–1.2)
Total Protein: 7.2 g/dL (ref 6.0–8.3)

## 2014-07-04 LAB — VITAMIN B12: Vitamin B-12: 979 pg/mL — ABNORMAL HIGH (ref 211–911)

## 2014-07-04 LAB — TSH: TSH: 0.12 u[IU]/mL — ABNORMAL LOW (ref 0.35–4.50)

## 2014-07-04 LAB — T4, FREE: Free T4: 1.35 ng/dL (ref 0.60–1.60)

## 2014-07-04 NOTE — Patient Instructions (Signed)
Good to see you. We will call you with your lab results. Please stop by to see Rosaria Ferries or Vaughan Basta on your way out.

## 2014-07-04 NOTE — Assessment & Plan Note (Addendum)
With ? Facial asymmetry. >25 minutes spent in face to face time with patient, >50% spent in counselling or coordination of care Explained to Ms. Lorenzen that even if this was a stroke, there is nothing acutely that could be done 2 months later.  She does need a work up, refer to neurology and continue ASA. ?Bells palsy although not classic. Strength symmetrical on exam today. Labs ordered as well. Orders Placed This Encounter  Procedures  . Vitamin B12  . TSH  . T4, Free  . Comprehensive metabolic panel  . CBC with Differential/Platelet  . Ambulatory referral to Neurology

## 2014-07-04 NOTE — Progress Notes (Signed)
Subjective:   Patient ID: Lori Phillips, female    DOB: 1956/02/24, 59 y.o.   MRN: [id number]  Lori Phillips is a pleasant 59 y.o. year old female who presents to clinic today with daughter drooping of mouth and Drooling  on 07/04/2014  HPI: Per pt, woke up with left sided facial droop 2 months ago. Thought it would resolve but it hasn't.  Also feels she is becoming progressively weaker.  She is now using a walker.  Had been receiving epidural injections through pain clinic and had spine MRI (Dr. Primus Bravo)- she feel nothing has helped and her weakness is progressing.  No new rxs.  Denies any HA, blurred vision or speech difficulty.  Does feel she is drooling more on left side where she had a droop as well.  Never had difficulty keeping her eye lid closed.  Current Outpatient Prescriptions on File Prior to Visit  Medication Sig Dispense Refill  . ABILIFY 10 MG tablet Take 1 tablet by mouth once.    Marland Kitchen aspirin 81 MG tablet Take 81 mg by mouth daily.    . B-D ULTRAFINE III SHORT PEN 31G X 8 MM MISC USE AS DIRECTED 100 each 5  . Blood Glucose Monitoring Suppl (ONE TOUCH BASIC SYSTEM) W/DEVICE KIT Check blood sugar three times a day and as directed. Dx 250.00 1 each 0  . CARTIA XT 180 MG 24 hr capsule TAKE 1 CAPSULE BY MOUTH DAILY 90 capsule 0  . clonazePAM (KLONOPIN) 0.5 MG tablet Take 1 tablet by mouth once.    . CRESTOR 20 MG tablet TAKE 1 TABLET BY MOUTH EVERY DAY. 90 tablet 0  . divalproex (DEPAKOTE ER) 500 MG 24 hr tablet TAKE 1 TABLET BY MOUTH TWICE DAILY 60 tablet 0  . DULoxetine (CYMBALTA) 60 MG capsule TAKE 1 CAPSULE BY MOUTH ONCE DAILY 90 capsule 3  . gabapentin (NEURONTIN) 100 MG capsule Take 2 capsules (200 mg total) by mouth 3 (three) times daily. 180 capsule 2  . glipiZIDE-metformin (METAGLIP) 2.5-500 MG per tablet TAKE 2 TABLETS BY MOUTH TWICE DAILY BEFORE MEALS 120 tablet 0  . LANTUS SOLOSTAR 100 UNIT/ML Solostar Pen INJECT 35 UNITS UNDER THE SKIN EVERY NIGHT AT BEDTIME 15  mL 1  . levothyroxine (SYNTHROID, LEVOTHROID) 175 MCG tablet TAKE 1 TABLET BY MOUTH EVERY MORNING BEFORE BREAKFAST 90 tablet 0  . lisinopril (PRINIVIL,ZESTRIL) 5 MG tablet TAKE 1 TABLET BY MOUTH EVERY DAY. 30 tablet 5  . ONE TOUCH ULTRA TEST test strip USE AS DIRECTED TO CHECK BLOOD SUGAR THREE TIMES DAILY AND AS DIRECTED 300 each 0  . ONETOUCH DELICA LANCETS 25K MISC CHECK BLOOD SUGAR THREE TIMES DAILY AND AS DIRECTED 300 each 0  . Oxycodone HCl 10 MG TABS 1 tab by mouth every eight hours as needed for pain. 90 tablet 0  . pantoprazole (PROTONIX) 40 MG tablet TAKE 1 TABLET BY MOUTH EVERY DAY. 90 tablet 0  . traMADol (ULTRAM) 50 MG tablet TAKE 2 TABLETS BY MOUTH FOUR TIMES DAILY. 240 tablet 0  . VICTOZA 18 MG/3ML SOPN INJECT 1.8MG UNDER THE SKIN EVERY DAY AS DIRECTED 9 mL 1  . [DISCONTINUED] TAZTIA XT 180 MG 24 hr capsule TAKE ONE CAPSULE BY MOUTH EVERY DAY 90 capsule 0   No current facility-administered medications on file prior to visit.    Allergies  Allergen Reactions  . Byetta 10 Mcg Pen [Exenatide] Nausea And Vomiting  . Quetiapine     Past Medical History  Diagnosis Date  .  CAD (coronary artery disease)     multiple caths  . Diabetes mellitus   . Hyperlipidemia   . Hypertension   . Thyroid disease     hypothyroid  . Low back pain   . Nephrolithiasis   . Fibromyalgia   . Bipolar disorder   . Anxiety     Past Surgical History  Procedure Laterality Date  . Vaginal hysterectomy    . Tonsillectomy    . Colonoscopy  multiple    2005 [place] records not available-polyps x 3    Family History  Problem Relation Age of Onset  . Hypertension Mother   . Diabetes Mother   . Bipolar disorder Father   . Colon cancer Paternal Aunt   . Colon cancer Paternal Grandmother     History   Social History  . Marital Status: Single    Spouse Name: N/A  . Number of Children: 3  . Years of Education: N/A   Occupational History  . disability    .     Social History Main  Topics  . Smoking status: Never Smoker   . Smokeless tobacco: Never Used  . Alcohol Use: No  . Drug Use: No  . Sexual Activity: Not on file   Other Topics Concern  . Not on file   Social History Narrative   Has a living will- desires CPR but no prolonged life support if futile.   The PMH, PSH, Social History, Family History, Medications, and allergies have been reviewed in [facility], and have been updated if relevant.  Review of Systems  Constitutional: Positive for fatigue.  HENT: Negative.   Neurological: Positive for tremors, facial asymmetry and weakness. Negative for seizures, syncope, speech difficulty, light-headedness, numbness and headaches.  Psychiatric/Behavioral: Negative.   All other systems reviewed and are negative.      Objective:    BP 146/80 mmHg  Pulse 81  Temp(Src) 97.5 F (36.4 C) (Oral)  Wt 175 lb 12 oz (79.72 kg)  SpO2 98%   Physical Exam  Constitutional: She is oriented to person, place, and time. She appears well-developed and well-nourished. No distress.  HENT:  Head: Normocephalic.  Eyes: Conjunctivae are normal.  Neck: Normal range of motion.  Cardiovascular: Normal rate.   Pulmonary/Chest: Effort normal.  Neurological: She is alert and oriented to person, place, and time. She has normal strength. She displays no tremor. No cranial nerve deficit or sensory deficit. Coordination normal.  ?mild facial asymetry/ droop left, able to keep eye lids clos  Skin: Skin is warm and dry.  Psychiatric:  Flat affect- baseline  Nursing note and vitals reviewed.         Assessment & Plan:   Progressive focal motor weakness - Plan: Vitamin B12, TSH, T4, Free, Comprehensive metabolic panel, CBC with Differential/Platelet, Ambulatory referral to Neurology  Other specified hypothyroidism - Plan: TSH, T4, Free No Follow-up on file.

## 2014-07-04 NOTE — Progress Notes (Signed)
Pre visit review using our clinic review tool, if applicable. No additional management support is needed unless otherwise documented below in the visit note. 

## 2014-07-04 NOTE — Telephone Encounter (Signed)
Pt left v/m; pt was seen earlier today and pt checked with ins co and a rolator is approved by pts ins co.  pt would be responsible for 20 %. Pt request written prescription for rolator sent to wherever Dr Deborra Medina prefers to send rx. Pt request cb.

## 2014-07-05 ENCOUNTER — Other Ambulatory Visit: Payer: Self-pay | Admitting: Family Medicine

## 2014-07-05 DIAGNOSIS — E039 Hypothyroidism, unspecified: Secondary | ICD-10-CM

## 2014-07-05 NOTE — Telephone Encounter (Signed)
Rx written and given to Waynetta. 

## 2014-07-05 NOTE — Telephone Encounter (Signed)
Lm on pts vm and informed her Rx is available for pickup from the front desk. Pt advised, unfortunately, we do not send these types of Rx. Pt must personally take it to a medical supply company of their choice.

## 2014-07-06 ENCOUNTER — Ambulatory Visit: Payer: Self-pay | Admitting: Family Medicine

## 2014-07-06 DIAGNOSIS — R251 Tremor, unspecified: Secondary | ICD-10-CM | POA: Diagnosis not present

## 2014-07-06 DIAGNOSIS — R531 Weakness: Secondary | ICD-10-CM | POA: Diagnosis not present

## 2014-07-07 ENCOUNTER — Ambulatory Visit: Payer: Self-pay | Admitting: Unknown Physician Specialty

## 2014-07-07 ENCOUNTER — Ambulatory Visit: Payer: Self-pay | Admitting: Neurology

## 2014-07-07 DIAGNOSIS — Z79899 Other long term (current) drug therapy: Secondary | ICD-10-CM | POA: Diagnosis not present

## 2014-07-07 DIAGNOSIS — Z7952 Long term (current) use of systemic steroids: Secondary | ICD-10-CM | POA: Diagnosis not present

## 2014-07-07 DIAGNOSIS — Z7982 Long term (current) use of aspirin: Secondary | ICD-10-CM | POA: Diagnosis not present

## 2014-07-07 DIAGNOSIS — R251 Tremor, unspecified: Secondary | ICD-10-CM | POA: Diagnosis not present

## 2014-07-07 DIAGNOSIS — T18100A Unspecified foreign body in esophagus causing compression of trachea, initial encounter: Secondary | ICD-10-CM | POA: Diagnosis not present

## 2014-07-07 DIAGNOSIS — R0989 Other specified symptoms and signs involving the circulatory and respiratory systems: Secondary | ICD-10-CM | POA: Diagnosis not present

## 2014-07-07 DIAGNOSIS — Z79891 Long term (current) use of opiate analgesic: Secondary | ICD-10-CM | POA: Diagnosis not present

## 2014-07-07 DIAGNOSIS — T17208A Unspecified foreign body in pharynx causing other injury, initial encounter: Secondary | ICD-10-CM | POA: Diagnosis not present

## 2014-07-07 DIAGNOSIS — E119 Type 2 diabetes mellitus without complications: Secondary | ICD-10-CM | POA: Diagnosis not present

## 2014-07-08 DIAGNOSIS — T18100A Unspecified foreign body in esophagus causing compression of trachea, initial encounter: Secondary | ICD-10-CM | POA: Diagnosis not present

## 2014-07-08 DIAGNOSIS — T18108A Unspecified foreign body in esophagus causing other injury, initial encounter: Secondary | ICD-10-CM | POA: Diagnosis not present

## 2014-07-12 ENCOUNTER — Other Ambulatory Visit: Payer: Self-pay | Admitting: Family Medicine

## 2014-07-14 DIAGNOSIS — M6281 Muscle weakness (generalized): Secondary | ICD-10-CM | POA: Diagnosis not present

## 2014-07-25 ENCOUNTER — Ambulatory Visit (INDEPENDENT_AMBULATORY_CARE_PROVIDER_SITE_OTHER): Payer: Medicare Other | Admitting: Endocrinology

## 2014-07-25 ENCOUNTER — Encounter: Payer: Self-pay | Admitting: Endocrinology

## 2014-07-25 VITALS — BP 128/74 | HR 73 | Resp 18 | Wt 181.2 lb

## 2014-07-25 DIAGNOSIS — E038 Other specified hypothyroidism: Secondary | ICD-10-CM

## 2014-07-25 MED ORDER — LEVOTHYROXINE SODIUM 150 MCG PO TABS: 150.0000 ug | ORAL_TABLET | Freq: Every day | ORAL | Status: DC

## 2014-07-25 NOTE — Progress Notes (Signed)
Reason for visit: Hypothyroidism  HPI  Lori Phillips is a 59 y.o.-year-old female, referred by her PCP, Arnette Norris, MD,  for evaluation for hypothyroidism. The patient was seen by Dr Cruzita Lederer in April 2015 for Diabetes. This problem was not discussed at the current visit, as the referral was for Hypothyroidism. The patient apparently is not taking care of her diabetes and not checking her sugars. I gave her a sample One Touch mini meter and asked her to discuss follow up and management with her PCP- if the patient and the PCP are willing then DM could be re-discussed at my clinic visits.    Patient recalls being diagnosed with hypothyroidism in >30 years following blood work. Most recently, she has been on Levothyroxine 175 mcg daily. Patient has been taking this medication fasting, with water, separate from breakfast > 30 minutes and from pills ( calcium, iron,  multivitamins)> 4 hours. Denies missing any doses recently. Takes PPI at BF.   Denies any recent use of iodine supplements or herbal medications.   I reviewed pt's thyroid tests: TSH has been consistently low since 2014, with normal free T4. Patient reports that her dose was lowered from 175 mcg daily to a low dose about 3-4 years and her levels were too low, so it was increased back to 175 mcg. No recent dose changes.   Lab Results  Component Value Date   TSH 0.12* 07/04/2014   TSH 0.16* 08/26/2013   TSH 0.34* 07/05/2013   TSH 0.28* 04/26/2012   TSH 2.06 01/26/2012   TSH 29.88* 12/24/2010   TSH 20.644* 11/22/2010   TSH 12.10* 07/23/2010   TSH 0.26* 01/01/2010   TSH 0.03* 11/26/2009   FREET4 1.35 07/04/2014   FREET4 1.44 08/26/2013   FREET4 0.76 07/05/2013   FREET4 0.74 12/24/2010     She reports having an Esophageal stricture stretched 2 weeks ago; she has been having trouble with swallowing for a while and now symptom is better Cant sleep on her back due to SOB, now S/p CPAP surgery  Review of systems: [ x  ] complains of     [  ] denies [ x ] weight loss- of about 20 lbs in past few months due to unsteady gait and decreased appetite  [  ] constipation  [x  ] fatigue   [  ] dry skin  [  ] cold intolerance [ x ] hair loss [  ] noticing any enlargement in size of thyroid [  ] lumps in neck [ x ] dysphagia-related to stricture? [  ] change in voice [  ]  SOB with lying down.  she has family history of  thyroid disorders in: none reported. I reviewed her chart and she also has a history of Diabetes as mentioned above. Associated hx CAD, bipolar disease, progressive focal motor weakness.  Past Medical History  Diagnosis Date  . CAD (coronary artery disease)     multiple caths  . Diabetes mellitus   . Hyperlipidemia   . Hypertension   . Thyroid disease     hypothyroid  . Low back pain   . Nephrolithiasis   . Fibromyalgia   . Bipolar disorder   . Anxiety    Past Surgical History  Procedure Laterality Date  . Vaginal hysterectomy    . Tonsillectomy    . Colonoscopy  multiple    2005 [place] records not available-polyps x 3   Family History  Problem Relation Age of Onset  .  Hypertension Mother   . Diabetes Mother   . Bipolar disorder Father   . Colon cancer Paternal Aunt   . Colon cancer Paternal Grandmother   . Thyroid disease Neg Hx    History   Social History  . Marital Status: Single    Spouse Name: N/A  . Number of Children: 3  . Years of Education: N/A   Occupational History  . disability    .     Social History Main Topics  . Smoking status: Never Smoker   . Smokeless tobacco: Never Used  . Alcohol Use: No  . Drug Use: No  . Sexual Activity: Not on file   Other Topics Concern  . Not on file   Social History Narrative   Has a living will- desires CPR but no prolonged life support if futile.   Current Outpatient Prescriptions on File Prior to Visit  Medication Sig Dispense Refill  . ABILIFY 10 MG tablet Take 1 tablet by mouth once.    Marland Kitchen aspirin 81 MG tablet Take 81 mg  by mouth daily.    . B-D ULTRAFINE III SHORT PEN 31G X 8 MM MISC USE AS DIRECTED 100 each 5  . Blood Glucose Monitoring Suppl (ONE TOUCH BASIC SYSTEM) W/DEVICE KIT Check blood sugar three times a day and as directed. Dx 250.00 1 each 0  . CARTIA XT 180 MG 24 hr capsule TAKE 1 CAPSULE BY MOUTH DAILY 90 capsule 0  . clonazePAM (KLONOPIN) 0.5 MG tablet Take 1 tablet by mouth once.    . CRESTOR 20 MG tablet TAKE 1 TABLET BY MOUTH EVERY DAY 30 tablet 0  . divalproex (DEPAKOTE ER) 500 MG 24 hr tablet TAKE 1 TABLET BY MOUTH TWICE DAILY 60 tablet 0  . DULoxetine (CYMBALTA) 60 MG capsule TAKE 1 CAPSULE BY MOUTH ONCE DAILY 90 capsule 3  . gabapentin (NEURONTIN) 100 MG capsule Take 2 capsules (200 mg total) by mouth 3 (three) times daily. 180 capsule 2  . glipiZIDE-metformin (METAGLIP) 2.5-500 MG per tablet TAKE 2 TABLETS BY MOUTH TWICE DAILY BEFORE MEALS 120 tablet 0  . LANTUS SOLOSTAR 100 UNIT/ML Solostar Pen INJECT 35 UNITS UNDER THE SKIN EVERY NIGHT AT BEDTIME 15 mL 1  . lisinopril (PRINIVIL,ZESTRIL) 5 MG tablet TAKE 1 TABLET BY MOUTH EVERY DAY. 30 tablet 5  . ONE TOUCH ULTRA TEST test strip USE AS DIRECTED TO CHECK BLOOD SUGAR THREE TIMES DAILY AND AS DIRECTED 300 each 0  . ONETOUCH DELICA LANCETS 50K MISC CHECK BLOOD SUGAR THREE TIMES DAILY AND AS DIRECTED 300 each 0  . Oxycodone HCl 10 MG TABS 1 tab by mouth every eight hours as needed for pain. 90 tablet 0  . pantoprazole (PROTONIX) 40 MG tablet TAKE 1 TABLET BY MOUTH EVERY DAY. 90 tablet 0  . traMADol (ULTRAM) 50 MG tablet TAKE 2 TABLETS BY MOUTH FOUR TIMES DAILY. 240 tablet 0  . VICTOZA 18 MG/3ML SOPN INJECT 1.$RemoveBefor'8MG'[id number]$  UNDER THE SKIN EVERY DAY AS DIRECTED 9 mL 1  . [DISCONTINUED] TAZTIA XT 180 MG 24 hr capsule TAKE ONE CAPSULE BY MOUTH EVERY DAY 90 capsule 0   No current facility-administered medications on file prior to visit.   Allergies  Allergen Reactions  . Byetta 10 Mcg Pen [Exenatide] Nausea And Vomiting  . Quetiapine     Review of  Systems: $RemoveB'[x]'[id number]$  complains of  [  ] denies General:   [ x ] Recent weight change [ x ] Fatigue  [  ] Loss  of appetite Eyes: [  ]  Vision Difficulty [  ]  Eye pain ENT: [  ]  Hearing difficulty [  ]  Difficulty Swallowing CVS: [  ] Chest pain [  ]  Palpitations/Irregular Heart beat [  ]  Shortness of breath lying flat [  ] Swelling of legs Resp: [  ] Frequent Cough [  ] Shortness of Breath  [  ]  Wheezing GI: [  ] Heartburn  [  ] Nausea or Vomiting  [  ] Diarrhea [  ] Constipation  [  ] Abdominal Pain GU: [  ]  Polyuria  [ x ]  nocturia Bones/joints:  [  ]  Muscle aches  [  ] Joint Pain  [  ] Bone pain Skin/Hair/Nails: [  ]  Rash  [  ] New stretch marks [  ]  Itching [ x ] Hair loss [  ]  Excessive hair growth Reproduction: [  ] Low sexual desire , [  ]  Women: Menstrual cycle problems [  ]  Women: Breast Discharge [  ] Men: Difficulty with erections [  ]  Men: Enlarged Breasts CNS: [  ] Frequent Headaches [ x ] Blurry vision [ x ] Tremors [  ] Seizures [  ] Loss of consciousness [ x ] Localized weakness Endocrine: [  ]  Excess thirst [  ]  Feeling excessively hot [  ]  Feeling excessively cold Heme: [  ]  Easy bruising [  ]  Enlarged glands or lumps in neck Allergy: [  ]  Food allergies [  ] Environmental allergies  PE: BP 128/74 mmHg  Pulse 73  Resp 18  Wt 181 lb 4 oz (82.214 kg)  SpO2 97% Wt Readings from Last 3 Encounters:  07/25/14 181 lb 4 oz (82.214 kg)  07/04/14 175 lb 12 oz (79.72 kg)  03/06/14 177 lb 4 oz (80.4 kg)    HEENT: [place]/AT, EOMI, no icterus, no proptosis, no chemosis, no mild lid lag, no retraction, eyes close completely, normal VF testing on confrontation, mild left sided angle of mouth droop ( stable per patient) Neck: thyroid gland - smooth, non-tender, no erythema,  negative Pemberton's sign; no lymphadenopathy; no bruits Lungs: good air entry, clear bilaterally Heart: S1&S2 normal, regular rate & rhythm; systolic murmurs,no  rubs or gallops Abd: soft, NT, ND, no HSM,  +BS Ext: fine tremor in hands bilaterally, min edema, 2+ DP/PT pulses, good muscle mass Neuro: normal gait- walks with cane, 2+ reflexes bilaterally, slight weakness all extremities, no proximal myopathy  Derm: no pretibial myxoedema/skin dryness   ASSESSMENT: 1. Hypothyroidism, uncontrolled   Problem List Items Addressed This Visit      Endocrine   Hypothyroidism - Primary    Patient with long-standing hypothyroidism, on levothyroxine therapy. she appears clinically euthyroid- slightly hyperthyroid today. -Discussed with the patient regarding thyroid hormone physiology, symptoms and signs of hypothyroidism  - We discussed about correct intake of levothyroxine, fasting, with water, separated by at least 30 minutes from breakfast, and separated by more than 4 hours from calcium, iron, multivitamins, acid reflux medications (PPIs). We discussed about medication compliance.  - she does not appear to have a goiter, thyroid nodules - Recent TSHs have been abnormal, I have asked her to decrease dose of levothyroxine to 150 mcg daily and recheck levels at next visit in 6 weeks.       Relevant Medications   levothyroxine (SYNTHROID, LEVOTHROID) tablet      -  RTC in 6 weeks. She will discuss with her PCP regarding follow up for DM.  25 minutes spent with the patient, >50% time spent on discussion of above topics  Brystal Kildow [facility]  07/25/2014   1:17 PM

## 2014-07-25 NOTE — Patient Instructions (Signed)
Change levothyroxine to 150 mcg daily.  Recheck levels in 2 months at clinic visit.   Please come back for a follow-up appointment in 2 months.

## 2014-07-25 NOTE — Progress Notes (Signed)
Pre visit review using our clinic review tool, if applicable. No additional management support is needed unless otherwise documented below in the visit note. 

## 2014-07-25 NOTE — Assessment & Plan Note (Signed)
Patient with long-standing hypothyroidism, on levothyroxine therapy. she appears clinically euthyroid- slightly hyperthyroid today. -Discussed with the patient regarding thyroid hormone physiology, symptoms and signs of hypothyroidism  - We discussed about correct intake of levothyroxine, fasting, with water, separated by at least 30 minutes from breakfast, and separated by more than 4 hours from calcium, iron, multivitamins, acid reflux medications (PPIs). We discussed about medication compliance.  - she does not appear to have a goiter, thyroid nodules - Recent TSHs have been abnormal, I have asked her to decrease dose of levothyroxine to 150 mcg daily and recheck levels at next visit in 6 weeks.

## 2014-07-27 ENCOUNTER — Ambulatory Visit
Admit: 2014-07-27 | Disposition: A | Discharge status: Home / Self Care | Payer: Self-pay | Attending: Pain Medicine | Admitting: Pain Medicine

## 2014-07-27 DIAGNOSIS — M461 Sacroiliitis, not elsewhere classified: Secondary | ICD-10-CM | POA: Diagnosis not present

## 2014-07-27 DIAGNOSIS — M47896 Other spondylosis, lumbar region: Secondary | ICD-10-CM | POA: Diagnosis not present

## 2014-07-27 DIAGNOSIS — M5416 Radiculopathy, lumbar region: Secondary | ICD-10-CM | POA: Diagnosis not present

## 2014-07-27 DIAGNOSIS — M47817 Spondylosis without myelopathy or radiculopathy, lumbosacral region: Secondary | ICD-10-CM | POA: Diagnosis not present

## 2014-07-27 DIAGNOSIS — M792 Neuralgia and neuritis, unspecified: Secondary | ICD-10-CM | POA: Diagnosis not present

## 2014-07-27 DIAGNOSIS — M419 Scoliosis, unspecified: Secondary | ICD-10-CM | POA: Diagnosis not present

## 2014-07-28 ENCOUNTER — Other Ambulatory Visit: Payer: Self-pay | Admitting: Family Medicine

## 2014-07-31 ENCOUNTER — Telehealth: Payer: Self-pay

## 2014-07-31 ENCOUNTER — Other Ambulatory Visit: Payer: Self-pay | Admitting: Family Medicine

## 2014-07-31 NOTE — Telephone Encounter (Signed)
Pt said the lancets she has will not fit in her lancet device; pt said she needs delica lancets; spoke with [facility] and that is what pt picked up on 05/2014. Pt retried delica lancets that she has and they did fit in the lancet device; nothing further needed.

## 2014-07-31 NOTE — Telephone Encounter (Signed)
Pt request test strips to ck blood sugar; advised pt [facility] should have refills available and pt will ck with pharmacy.

## 2014-08-06 ENCOUNTER — Other Ambulatory Visit: Payer: Self-pay | Admitting: Family Medicine

## 2014-08-09 DIAGNOSIS — E119 Type 2 diabetes mellitus without complications: Secondary | ICD-10-CM | POA: Diagnosis not present

## 2014-08-09 DIAGNOSIS — I2510 Atherosclerotic heart disease of native coronary artery without angina pectoris: Secondary | ICD-10-CM | POA: Diagnosis not present

## 2014-08-09 DIAGNOSIS — R531 Weakness: Secondary | ICD-10-CM | POA: Diagnosis not present

## 2014-08-09 DIAGNOSIS — R251 Tremor, unspecified: Secondary | ICD-10-CM | POA: Diagnosis not present

## 2014-08-09 DIAGNOSIS — R29818 Other symptoms and signs involving the nervous system: Secondary | ICD-10-CM | POA: Diagnosis not present

## 2014-08-10 ENCOUNTER — Other Ambulatory Visit: Payer: Self-pay | Admitting: Internal Medicine

## 2014-08-14 DIAGNOSIS — R531 Weakness: Secondary | ICD-10-CM | POA: Diagnosis not present

## 2014-08-14 DIAGNOSIS — M542 Cervicalgia: Secondary | ICD-10-CM | POA: Insufficient documentation

## 2014-08-14 DIAGNOSIS — R251 Tremor, unspecified: Secondary | ICD-10-CM | POA: Diagnosis not present

## 2014-08-14 HISTORY — DX: Cervicalgia: M54.2

## 2014-08-22 ENCOUNTER — Other Ambulatory Visit: Payer: Self-pay | Admitting: Family Medicine

## 2014-08-22 ENCOUNTER — Other Ambulatory Visit: Payer: Self-pay | Admitting: Internal Medicine

## 2014-08-22 ENCOUNTER — Other Ambulatory Visit: Payer: Self-pay | Admitting: Neurology

## 2014-08-22 DIAGNOSIS — R531 Weakness: Secondary | ICD-10-CM

## 2014-08-22 DIAGNOSIS — M542 Cervicalgia: Secondary | ICD-10-CM

## 2014-08-26 ENCOUNTER — Ambulatory Visit
Admission: RE | Admit: 2014-08-26 | Discharge: 2014-08-26 | Disposition: A | Discharge status: Home / Self Care | Payer: Medicare Other | Source: Ambulatory Visit | Attending: Neurology | Admitting: Neurology

## 2014-08-26 DIAGNOSIS — M542 Cervicalgia: Secondary | ICD-10-CM | POA: Diagnosis not present

## 2014-08-26 DIAGNOSIS — R29898 Other symptoms and signs involving the musculoskeletal system: Secondary | ICD-10-CM | POA: Diagnosis not present

## 2014-08-26 DIAGNOSIS — R531 Weakness: Secondary | ICD-10-CM | POA: Diagnosis not present

## 2014-08-29 ENCOUNTER — Ambulatory Visit: Payer: Medicare Other | Attending: Pain Medicine | Admitting: Pain Medicine

## 2014-08-29 ENCOUNTER — Encounter: Payer: Self-pay | Admitting: Pain Medicine

## 2014-08-29 ENCOUNTER — Telehealth: Payer: Self-pay

## 2014-08-29 VITALS — BP 140/63 | HR 89 | Temp 97.6°F | Resp 18 | Ht 64.5 in | Wt 165.0 lb

## 2014-08-29 DIAGNOSIS — M5134 Other intervertebral disc degeneration, thoracic region: Secondary | ICD-10-CM | POA: Insufficient documentation

## 2014-08-29 DIAGNOSIS — M545 Low back pain: Secondary | ICD-10-CM | POA: Diagnosis present

## 2014-08-29 DIAGNOSIS — Z79899 Other long term (current) drug therapy: Secondary | ICD-10-CM | POA: Diagnosis not present

## 2014-08-29 DIAGNOSIS — M5416 Radiculopathy, lumbar region: Secondary | ICD-10-CM | POA: Diagnosis not present

## 2014-08-29 DIAGNOSIS — M792 Neuralgia and neuritis, unspecified: Secondary | ICD-10-CM | POA: Diagnosis not present

## 2014-08-29 DIAGNOSIS — M4184 Other forms of scoliosis, thoracic region: Secondary | ICD-10-CM | POA: Insufficient documentation

## 2014-08-29 DIAGNOSIS — M47818 Spondylosis without myelopathy or radiculopathy, sacral and sacrococcygeal region: Secondary | ICD-10-CM

## 2014-08-29 DIAGNOSIS — M47817 Spondylosis without myelopathy or radiculopathy, lumbosacral region: Secondary | ICD-10-CM | POA: Diagnosis not present

## 2014-08-29 DIAGNOSIS — M461 Sacroiliitis, not elsewhere classified: Secondary | ICD-10-CM | POA: Insufficient documentation

## 2014-08-29 DIAGNOSIS — Z0389 Encounter for observation for other suspected diseases and conditions ruled out: Secondary | ICD-10-CM | POA: Diagnosis not present

## 2014-08-29 DIAGNOSIS — M47816 Spondylosis without myelopathy or radiculopathy, lumbar region: Secondary | ICD-10-CM | POA: Insufficient documentation

## 2014-08-29 DIAGNOSIS — F1120 Opioid dependence, uncomplicated: Secondary | ICD-10-CM | POA: Diagnosis not present

## 2014-08-29 DIAGNOSIS — G8929 Other chronic pain: Secondary | ICD-10-CM | POA: Diagnosis not present

## 2014-08-29 DIAGNOSIS — G588 Other specified mononeuropathies: Secondary | ICD-10-CM | POA: Insufficient documentation

## 2014-08-29 DIAGNOSIS — Z5181 Encounter for therapeutic drug level monitoring: Secondary | ICD-10-CM | POA: Diagnosis not present

## 2014-08-29 MED ORDER — GABAPENTIN 100 MG PO CAPS: ORAL_CAPSULE | ORAL | Status: DC

## 2014-08-29 MED ORDER — OXYCODONE HCL 10 MG PO TABS: ORAL_TABLET | ORAL | Status: DC

## 2014-08-29 NOTE — Progress Notes (Signed)
Patient is 58 year old female returns to pain management Center for her lower back lower extremity pain evaluation patient's pain increases with standing and walking twisting and turning and has difficulty sleeping in bed as well. Patient with lower extremity swelling. Have patient follow-up with Dr. Marjory Lies avoid interventional treatment to continue oxycodone and gabapentin as prescribed. The patient is understanding and in agreement with suggested treatment plan  Physical examination revealed tenderness of the splenius capitis super talus region there was tenderness of the trapezius and levator scapula rhomboid muscles all of mild degree. Patient with bilaterally equal grip strength with Tinel and Phalen's maneuver without increased pain there was tenderness over the lumbar paraspinal musculature region of mild degree no allodynia of the lower extremities. Leg raising was tolerated to approximately 30. Lower extremity than left lower extremity which is chronic condition. EHL strength appeared to be slightly decreased without sensory deficit of dermatomal distribution abdomen soft nontender without excessive tenderness to palpation still vertebral and tenderness noted  Assessment   Degenerative disc disease lumbar spine spondylosis with mild left-sided impingement L4-5 level  Degenerative disc disease thoracic spine T7-8 with spondylitic scoliosis and mild to moderate eccentric impingement at T7-8 with borderline left eccentric eccentric impingement T11-T12 T12-L1 probable cyst of kidney  Sacroiliitis sacroiliac joint dysfunction  Assessment syndrome  PLAN   Continue present medications.  F/U PCP for evaliation of  BP and general medical  Condition.  F/U surgical evaluation.  F/U neurological evaluation.  May consider radiofrequency rhizolysis or intraspinal procedures pending response to present treatment and F/U evaluation  Patient to call Pain Management Center should patient have  concerns prior to scheduled return appointment.Marland Kitchen

## 2014-08-29 NOTE — Telephone Encounter (Signed)
Pharmacy called and patient able to fill script today. patient called and informed.

## 2014-08-29 NOTE — Progress Notes (Signed)
Discharged ambulatory with walker at 1047 scripts given to pt  Oxycodone

## 2014-08-29 NOTE — Progress Notes (Signed)
   Subjective:    Patient ID: Lori Phillips, female    DOB: 03-08-1956, 59 y.o.   MRN: [id number]  HPI    Review of Systems     Objective:   Physical Exam        Assessment & Plan:

## 2014-08-29 NOTE — Patient Instructions (Signed)
Continue present medications.  F/U PCP for evaliation of  BP and general medical  condition.. Patient is to see Dr. Deborra Medina for lower extremity swelling as discussed today. Nurses please have secretary schedule patient appointment evaluation of lower extremity swelling  F/U surgical evaluation.  F/U nrurological evaluation.  May consider radiofrequency rhizolysis or intraspinal procedures pending response to present treatment and F/U evaluation.  Patient to call Pain Management Center should patient have concerns prior to scheduled return appointment.

## 2014-08-29 NOTE — Progress Notes (Signed)
   Subjective:    Patient ID: Lori Phillips, female    DOB: 13-Apr-1956, 59 y.o.   MRN: [id number]  HPI    Review of Systems     Objective:   Physical Exam        Assessment & Plan:

## 2014-08-31 NOTE — Addendum Note (Signed)
Addended by: Dewayne Shorter on: 08/31/2014 11:25 AM   Modules accepted: Orders

## 2014-09-05 ENCOUNTER — Ambulatory Visit: Payer: Medicare Other | Admitting: Endocrinology

## 2014-09-07 DIAGNOSIS — R531 Weakness: Secondary | ICD-10-CM | POA: Diagnosis not present

## 2014-09-07 DIAGNOSIS — G20 Parkinson's disease: Secondary | ICD-10-CM | POA: Diagnosis not present

## 2014-09-07 DIAGNOSIS — M542 Cervicalgia: Secondary | ICD-10-CM | POA: Diagnosis not present

## 2014-09-07 DIAGNOSIS — R2681 Unsteadiness on feet: Secondary | ICD-10-CM | POA: Diagnosis not present

## 2014-09-13 ENCOUNTER — Other Ambulatory Visit: Payer: Self-pay | Admitting: Pain Medicine

## 2014-09-17 DIAGNOSIS — J449 Chronic obstructive pulmonary disease, unspecified: Secondary | ICD-10-CM | POA: Diagnosis not present

## 2014-09-21 ENCOUNTER — Other Ambulatory Visit: Payer: Self-pay | Admitting: Family Medicine

## 2014-09-24 ENCOUNTER — Other Ambulatory Visit: Payer: Self-pay | Admitting: Pain Medicine

## 2014-09-24 DIAGNOSIS — M47818 Spondylosis without myelopathy or radiculopathy, sacral and sacrococcygeal region: Secondary | ICD-10-CM

## 2014-09-24 DIAGNOSIS — G588 Other specified mononeuropathies: Secondary | ICD-10-CM

## 2014-09-24 DIAGNOSIS — M533 Sacrococcygeal disorders, not elsewhere classified: Secondary | ICD-10-CM

## 2014-09-24 DIAGNOSIS — M461 Sacroiliitis, not elsewhere classified: Secondary | ICD-10-CM

## 2014-09-24 HISTORY — DX: Sacrococcygeal disorders, not elsewhere classified: M53.3

## 2014-09-26 ENCOUNTER — Ambulatory Visit (INDEPENDENT_AMBULATORY_CARE_PROVIDER_SITE_OTHER): Payer: Medicare Other | Admitting: Endocrinology

## 2014-09-26 ENCOUNTER — Encounter: Payer: Self-pay | Admitting: Endocrinology

## 2014-09-26 VITALS — BP 122/70 | HR 73 | Resp 12 | Ht 64.5 in | Wt 172.8 lb

## 2014-09-26 DIAGNOSIS — E038 Other specified hypothyroidism: Secondary | ICD-10-CM

## 2014-09-26 LAB — TSH: TSH: 0.03 u[IU]/mL — ABNORMAL LOW (ref 0.35–4.50)

## 2014-09-26 LAB — T4, FREE: Free T4: 1.74 ng/dL — ABNORMAL HIGH (ref 0.60–1.60)

## 2014-09-26 NOTE — Progress Notes (Signed)
Pre visit review using our clinic review tool, if applicable. No additional management support is needed unless otherwise documented below in the visit note. 

## 2014-09-26 NOTE — Patient Instructions (Signed)
Labs today. Adjust dose of levothyroxine based on this.   Please come back for a follow-up appointment in 2 months.

## 2014-09-26 NOTE — Progress Notes (Signed)
Reason for visit: Hypothyroidism  HPI  Lori Phillips is a 59 y.o.-year-old female,for follow up for hypothyroidism. She had some questions on her Diabetes, which is currently being managed by Dr Deborra Medina- I have asked her to follow back with her PCP for this. Of Note, the patient is also 30 minute late for her appointment time. Last visit with me April 2016.    Patient recalls being diagnosed with hypothyroidism in >30 years following blood work. Most recently, she has been on Levothyroxine 150 mcg daily. Dose was decreased at last visit.  Patient has been taking this medication fasting, with water, separate from breakfast > 30 minutes and from pills ( calcium, iron,  multivitamins)> 4 hours. Denies missing any doses recently. Takes PPI at BF.   Denies any recent use of iodine supplements or herbal medications.   I reviewed pt's thyroid tests: TSH has been consistently low since 2014, with normal free T4. Patient reports that her dose was lowered from 175 mcg daily to a low dose about 3-4 years and her levels were too low, so it was increased back to 175 mcg.   Lab Results  Component Value Date   TSH 0.03* 09/26/2014   TSH 0.12* 07/04/2014   TSH 0.16* 08/26/2013   TSH 0.34* 07/05/2013   TSH 0.28* 04/26/2012   TSH 2.06 01/26/2012   TSH 29.88* 12/24/2010   TSH 20.644* 11/22/2010   TSH 12.10* 07/23/2010   TSH 0.26* 01/01/2010   FREET4 1.74* 09/26/2014   FREET4 1.35 07/04/2014   FREET4 1.44 08/26/2013   FREET4 0.76 07/05/2013   FREET4 0.74 12/24/2010     She reports having an Esophageal stricture stretched March 2016; she has been having trouble with swallowing for a while and now symptom is better Cant sleep on her back due to SOB, now S/p CPAP surgery  Review of systems: [ x  ] complains of    [  ] denies [ x ] weight loss- of about 20 lbs in past few months due to unsteady gait and decreased appetite>>now weight gain since last visit  [  ] constipation  [  ] fatigue   [  ] dry skin   [  ] cold intolerance [ x ] hair loss [  ] noticing any enlargement in size of thyroid [  ] lumps in neck [ x ] dysphagia-related to stricture?- better [  ] change in voice [  ]  SOB with lying down.  she has family history of  thyroid disorders in: none reported. I reviewed her chart and she also has a history of Diabetes as mentioned above. Associated hx CAD, bipolar disease, progressive focal motor weakness. Reports recent dx of Parkinson's.  Past Medical History  Diagnosis Date  . CAD (coronary artery disease)     multiple caths  . Diabetes mellitus   . Hyperlipidemia   . Hypertension   . Thyroid disease     hypothyroid  . Low back pain   . Nephrolithiasis   . Fibromyalgia   . Bipolar disorder   . Anxiety   . Panic attacks   . Enlarged liver   . Rheumatoid arthritis   . Enlarged pituitary gland   . Fibromyalgia   . Scoliosis   . Osteoporosis   . Osteoarthritis   . Kidney stones   . Hypothyroidism    Past Surgical History  Procedure Laterality Date  . Vaginal hysterectomy    . Tonsillectomy    . Colonoscopy  multiple  2005 [place] records not available-polyps x 3  . Cardiac catheterization      x3  . Esophageal dilation    . Back surgery    . Kidney stone removal     Family History  Problem Relation Age of Onset  . Hypertension Mother   . Diabetes Mother   . Bipolar disorder Father   . Colon cancer Paternal Aunt   . Colon cancer Paternal Grandmother   . Thyroid disease Neg Hx    History   Social History  . Marital Status: Single    Spouse Name: N/A  . Number of Children: 3  . Years of Education: N/A   Occupational History  . disability    .     Social History Main Topics  . Smoking status: Never Smoker   . Smokeless tobacco: Never Used  . Alcohol Use: Yes     Comment: socially  . Drug Use: No  . Sexual Activity: Not on file   Other Topics Concern  . Not on file   Social History Narrative   Has a living will- desires CPR but no  prolonged life support if futile.   Current Outpatient Prescriptions on File Prior to Visit  Medication Sig Dispense Refill  . amitriptyline (ELAVIL) 10 MG tablet Take by mouth at bedtime as needed.     . ARIPiprazole (ABILIFY) 15 MG tablet Take 15 mg by mouth at bedtime.    Marland Kitchen aspirin 81 MG tablet Take 81 mg by mouth daily.    . B-D UF III MINI PEN NEEDLES 31G X 5 MM MISC USE AS DIRECTED 100 each 0  . B-D ULTRAFINE III SHORT PEN 31G X 8 MM MISC USE AS DIRECTED 100 each 5  . Blood Glucose Monitoring Suppl (ONE TOUCH BASIC SYSTEM) W/DEVICE KIT Check blood sugar three times a day and as directed. Dx 250.00 1 each 0  . clonazePAM (KLONOPIN) 0.5 MG tablet Take 1 tablet by mouth daily as needed.     . CRESTOR 20 MG tablet TAKE 1 TABLET BY MOUTH DAILY 30 tablet 0  . diazepam (VALIUM) 5 MG tablet Take 5 mg by mouth daily.    Marland Kitchen diltiazem (CARDIZEM CD) 180 MG 24 hr capsule TAKE ONE CAPSULE BY MOUTH EVERY DAY 90 capsule 0  . divalproex (DEPAKOTE ER) 500 MG 24 hr tablet TAKE 1 TABLET BY MOUTH TWICE DAILY 60 tablet 0  . DULoxetine (CYMBALTA) 60 MG capsule TAKE 1 CAPSULE BY MOUTH EVERY DAY 90 capsule 0  . gabapentin (NEURONTIN) 100 MG capsule Limited 1-2 tablets by mouth twice a day to 3 times a day if tolerated 180 capsule 0  . glipiZIDE-metformin (METAGLIP) 2.5-500 MG per tablet TAKE 2 TABLETS BY MOUTH TWICE DAILY BEFORE MEALS 120 tablet 0  . LANTUS SOLOSTAR 100 UNIT/ML Solostar Pen INJECT 35 UNITS UNDER THE SKIN EVERY NIGHT AT BEDTIME 15 mL 0  . levothyroxine (SYNTHROID, LEVOTHROID) 150 MCG tablet Take 1 tablet (150 mcg total) by mouth daily before breakfast. 30 tablet 3  . lisinopril (PRINIVIL,ZESTRIL) 5 MG tablet TAKE 1 TABLET BY MOUTH EVERY DAY. 30 tablet 5  . ONE TOUCH ULTRA TEST test strip USE AS DIRECTED TO CHECK BLOOD SUGAR THREE TIMES DAILY AND AS DIRECTED 300 each 0  . ONETOUCH DELICA LANCETS 27N MISC CHECK BLOOD SUGAR THREE TIMES DAILY AND AS DIRECTED 300 each 0  . Oxycodone HCl 10 MG TABS  Limited 1 tab by mouth twice a day to 3 times a day  if tolerated 90 tablet 0  . pantoprazole (PROTONIX) 40 MG tablet TAKE 1 TABLET BY MOUTH EVERY DAY. 90 tablet 0  . VICTOZA 18 MG/3ML SOPN INJECT 1.$RemoveBefor'8MG'[id number]$  UNDER THE SKIN EVERY DAY AS DIRECTED 9 mL 1  . vitamin B-12 (CYANOCOBALAMIN) 500 MCG tablet Take 500 mcg by mouth daily.    . [DISCONTINUED] TAZTIA XT 180 MG 24 hr capsule TAKE ONE CAPSULE BY MOUTH EVERY DAY 90 capsule 0   No current facility-administered medications on file prior to visit.   Allergies  Allergen Reactions  . Byetta 10 Mcg Pen [Exenatide] Nausea And Vomiting    Other reaction(s): Dizziness and giddiness (finding) incoherent  . Glyburide     Other reaction(s): Dizziness and giddiness (finding), Other (qualifier value) confusion  . Quetiapine   . Seroquel  [Quetiapine Fumarate]     Other reaction(s): Diarrhea and vomiting (finding)     PE: BP 122/70 mmHg  Pulse 73  Resp 12  Ht 5' 4.5" (1.638 m)  Wt 172 lb 12 oz (78.359 kg)  BMI 29.21 kg/m2  SpO2 97% Wt Readings from Last 3 Encounters:  09/26/14 172 lb 12 oz (78.359 kg)  08/29/14 165 lb (74.844 kg)  08/26/14 181 lb (82.101 kg)    HEENT: [place]/AT, EOMI, no icterus, no proptosis, no chemosis, no mild lid lag, no retraction, eyes close completely, normal VF testing on confrontation, mild left sided angle of mouth droop ( stable per patient) Neck: thyroid gland - smooth, non-tender, no erythema,  negative Pemberton's sign; no lymphadenopathy; no bruits Lungs: good air entry, clear bilaterally Heart: S1&S2 normal, regular rate & rhythm; systolic murmurs,no  rubs or gallops Ext: fine tremor in hands bilaterally, min edema, 2+ DP/PT pulses, good muscle mass Neuro: normal gait- walks with cane, 2+ reflexes bilaterally, slight weakness all extremities, no proximal myopathy  Derm: no pretibial myxoedema/skin dryness   ASSESSMENT: 1. Hypothyroidism, uncontrolled   Problem List Items Addressed This Visit      Endocrine    Hypothyroidism - Primary    Patient with long-standing hypothyroidism, on levothyroxine therapy. she appears clinically euthyroid- slightly hyperthyroid today.  - she does not appear to have a goiter, thyroid nodules -Update levels at this time and adjust dose of levothyroxine based on this.          Relevant Orders   TSH (Completed)   T4, free (Completed)      -RTC in 2 months. She will discuss with her PCP regarding follow up for DM.  She is going to discuss follow up with her PCP, since I am leaving this practice,as transferring out of state. Schedule follow up at [facility] location in the interim.   Chimaobi Casebolt [facility]  09/27/2014   9:44 AM

## 2014-09-27 ENCOUNTER — Other Ambulatory Visit: Payer: Self-pay | Admitting: Endocrinology

## 2014-09-27 ENCOUNTER — Other Ambulatory Visit: Payer: Self-pay | Admitting: Family Medicine

## 2014-09-27 MED ORDER — LEVOTHYROXINE SODIUM 137 MCG PO TABS: 137.0000 ug | ORAL_TABLET | Freq: Every day | ORAL | Status: DC

## 2014-09-27 NOTE — Assessment & Plan Note (Signed)
Patient with long-standing hypothyroidism, on levothyroxine therapy. she appears clinically euthyroid- slightly hyperthyroid today.  - she does not appear to have a goiter, thyroid nodules -Update levels at this time and adjust dose of levothyroxine based on this.

## 2014-09-28 ENCOUNTER — Ambulatory Visit: Payer: Medicare Other | Attending: Pain Medicine | Admitting: Pain Medicine

## 2014-09-28 ENCOUNTER — Other Ambulatory Visit: Payer: Self-pay | Admitting: Pain Medicine

## 2014-09-28 ENCOUNTER — Encounter: Payer: Self-pay | Admitting: Pain Medicine

## 2014-09-28 VITALS — BP 132/69 | HR 86 | Temp 98.2°F | Resp 16 | Ht 64.0 in | Wt 172.0 lb

## 2014-09-28 DIAGNOSIS — M47818 Spondylosis without myelopathy or radiculopathy, sacral and sacrococcygeal region: Secondary | ICD-10-CM

## 2014-09-28 DIAGNOSIS — M47814 Spondylosis without myelopathy or radiculopathy, thoracic region: Secondary | ICD-10-CM

## 2014-09-28 DIAGNOSIS — M5134 Other intervertebral disc degeneration, thoracic region: Secondary | ICD-10-CM | POA: Insufficient documentation

## 2014-09-28 DIAGNOSIS — M461 Sacroiliitis, not elsewhere classified: Secondary | ICD-10-CM

## 2014-09-28 DIAGNOSIS — M47817 Spondylosis without myelopathy or radiculopathy, lumbosacral region: Secondary | ICD-10-CM | POA: Diagnosis not present

## 2014-09-28 DIAGNOSIS — G20 Parkinson's disease: Secondary | ICD-10-CM | POA: Insufficient documentation

## 2014-09-28 DIAGNOSIS — M51369 Other intervertebral disc degeneration, lumbar region without mention of lumbar back pain or lower extremity pain: Secondary | ICD-10-CM

## 2014-09-28 DIAGNOSIS — M47812 Spondylosis without myelopathy or radiculopathy, cervical region: Secondary | ICD-10-CM | POA: Diagnosis not present

## 2014-09-28 DIAGNOSIS — G588 Other specified mononeuropathies: Secondary | ICD-10-CM | POA: Insufficient documentation

## 2014-09-28 DIAGNOSIS — M533 Sacrococcygeal disorders, not elsewhere classified: Secondary | ICD-10-CM | POA: Diagnosis not present

## 2014-09-28 DIAGNOSIS — M545 Low back pain: Secondary | ICD-10-CM | POA: Diagnosis present

## 2014-09-28 DIAGNOSIS — M4184 Other forms of scoliosis, thoracic region: Secondary | ICD-10-CM | POA: Diagnosis not present

## 2014-09-28 DIAGNOSIS — M79604 Pain in right leg: Secondary | ICD-10-CM | POA: Diagnosis present

## 2014-09-28 DIAGNOSIS — M792 Neuralgia and neuritis, unspecified: Secondary | ICD-10-CM | POA: Diagnosis not present

## 2014-09-28 DIAGNOSIS — M79605 Pain in left leg: Secondary | ICD-10-CM | POA: Diagnosis present

## 2014-09-28 DIAGNOSIS — M47894 Other spondylosis, thoracic region: Secondary | ICD-10-CM | POA: Insufficient documentation

## 2014-09-28 DIAGNOSIS — M5136 Other intervertebral disc degeneration, lumbar region: Secondary | ICD-10-CM | POA: Diagnosis not present

## 2014-09-28 DIAGNOSIS — M47816 Spondylosis without myelopathy or radiculopathy, lumbar region: Secondary | ICD-10-CM

## 2014-09-28 DIAGNOSIS — M5416 Radiculopathy, lumbar region: Secondary | ICD-10-CM | POA: Diagnosis not present

## 2014-09-28 HISTORY — DX: Other intervertebral disc degeneration, lumbar region without mention of lumbar back pain or lower extremity pain: M51.369

## 2014-09-28 HISTORY — DX: Other intervertebral disc degeneration, lumbar region: M51.36

## 2014-09-28 MED ORDER — GABAPENTIN 100 MG PO CAPS: ORAL_CAPSULE | ORAL | Status: DC

## 2014-09-28 MED ORDER — OXYCODONE HCL 10 MG PO TABS: ORAL_TABLET | ORAL | Status: DC

## 2014-09-28 NOTE — Progress Notes (Signed)
Discharged to home with script in hand for oxycodone.  Via wheelchair.  Return in 1 month

## 2014-09-28 NOTE — Patient Instructions (Signed)
Continue present medications. Oxycodone and Neurontin  F/U PCP for evaliation of  BP and general medical  condition.  F/U surgical evaluation.  F/U neurological evaluation. Follow-up Dr. Melrose Nakayama and Dr Manuella Ghazi as planned for further evaluation of Parkinson's  May consider radiofrequency rhizolysis or intraspinal procedures pending response to present treatment and F/U evaluation.  Patient to call [facility] should patient have concerns prior to scheduled return appointment.

## 2014-09-28 NOTE — Progress Notes (Signed)
Safety precautions to be maintained throughout the outpatient stay will include: orient to surroundings, keep bed in low position, maintain call bell within reach at all times, provide assistance with transfer out of bed and ambulation.  

## 2014-09-28 NOTE — Progress Notes (Signed)
   Subjective:    Patient ID: Lori Phillips, female    DOB: 22-Jul-1955, 59 y.o.   MRN: [id number]  HPI  Patient is 59 year old female returns to [place] for further evaluation treatment of pain involving the region of the lower back and lower extremity regions with mid back pain is well of lesser degree. Patient will undergo follow-up neurological evaluation for follow-up evaluation of her Parkinson's we will continue presently prescribed medications as discussed with patient on today's visit. Pending neurological evaluation we will consider modification of treatment regimen as discussed and as explained to patient on today's visit. The patient was understanding and agreed suggested treatment plan.     Review of Systems     Objective:   Physical Exam  There was mild tenderness of the splenius capitis and occipitalis musculature region. Palpation of the acromioclavicular glenohumeral joint region was without increased pain of any significant degree. No new lesions of the head and neck were noted. Palpation over the cervical paraspinal musculature region and thoracic paraspinal musculature region associated with mild to moderate spasms and discomfort. No crepitus of the thoracic region was noted. Palpation over the lumbar paraspinal musculature region lumbar facet region associated tends to palpation of moderate degree with evidence of moderate muscle spasms. Under tends to palpation of the PSIS and PII S regions palpation of the greater trochanteric region iliotibial band region reproduced mild discomfort. He was tolerates approximately 30 without an increase of pain with dorsiflexion noted. No sensory deficit of dermatomal distribution was detected. There was negative Homans. Abdomen was nontender and no costovertebral tenderness was noted.      Assessment & Plan:    Degenerative disc disease thoracic spine T7-T8 degenerative disc disease, spondylitic scoliosis and mild to  moderate eccentric impingement at T7-8 and borderline left eccentric impingement T 1112, T12-L1, probable cyst of the kidney.  Degenerative disc disease lumbar spine spondylosis multilevel involvement with mild left-sided impingement at L4-5 Lumbar facet syndrome  Intercostal neuralgia  Sacroiliac joint dysfunction  Parkinson's   Plan  Continue present medications. Neurontin and oxycodone   F/U PCP for evaliation of  BP and general medical  condition.  F/U surgical evaluation.  F/U neurological evaluation.. Patient has appointment to follow-up with Dr. Melrose Nakayama and Dr. Manuella Ghazi   May consider radiofrequency rhizolysis or intraspinal procedures pending response to present treatment and F/U evaluation.  Patient to call [facility] should patient have concerns prior to scheduled return appointment.

## 2014-09-29 DIAGNOSIS — H52223 Regular astigmatism, bilateral: Secondary | ICD-10-CM | POA: Diagnosis not present

## 2014-09-29 DIAGNOSIS — E119 Type 2 diabetes mellitus without complications: Secondary | ICD-10-CM | POA: Diagnosis not present

## 2014-09-29 DIAGNOSIS — H5213 Myopia, bilateral: Secondary | ICD-10-CM | POA: Diagnosis not present

## 2014-09-29 DIAGNOSIS — H26493 Other secondary cataract, bilateral: Secondary | ICD-10-CM | POA: Diagnosis not present

## 2014-10-12 ENCOUNTER — Emergency Department: Payer: Medicare Other

## 2014-10-12 ENCOUNTER — Observation Stay
Admission: EM | Admit: 2014-10-12 | Discharge: 2014-10-14 | Disposition: A | Discharge status: Skilled Nursing Facility | Payer: Medicare Other | Attending: Internal Medicine | Admitting: Internal Medicine

## 2014-10-12 ENCOUNTER — Telehealth: Payer: Self-pay

## 2014-10-12 ENCOUNTER — Encounter: Payer: Self-pay | Admitting: *Deleted

## 2014-10-12 DIAGNOSIS — K209 Esophagitis, unspecified: Secondary | ICD-10-CM | POA: Insufficient documentation

## 2014-10-12 DIAGNOSIS — E079 Disorder of thyroid, unspecified: Secondary | ICD-10-CM | POA: Diagnosis not present

## 2014-10-12 DIAGNOSIS — M5136 Other intervertebral disc degeneration, lumbar region: Secondary | ICD-10-CM | POA: Insufficient documentation

## 2014-10-12 DIAGNOSIS — E785 Hyperlipidemia, unspecified: Secondary | ICD-10-CM | POA: Diagnosis not present

## 2014-10-12 DIAGNOSIS — Z87442 Personal history of urinary calculi: Secondary | ICD-10-CM | POA: Diagnosis not present

## 2014-10-12 DIAGNOSIS — E1143 Type 2 diabetes mellitus with diabetic autonomic (poly)neuropathy: Secondary | ICD-10-CM | POA: Insufficient documentation

## 2014-10-12 DIAGNOSIS — F319 Bipolar disorder, unspecified: Secondary | ICD-10-CM | POA: Diagnosis not present

## 2014-10-12 DIAGNOSIS — Z794 Long term (current) use of insulin: Secondary | ICD-10-CM | POA: Insufficient documentation

## 2014-10-12 DIAGNOSIS — N390 Urinary tract infection, site not specified: Secondary | ICD-10-CM | POA: Diagnosis not present

## 2014-10-12 DIAGNOSIS — M069 Rheumatoid arthritis, unspecified: Secondary | ICD-10-CM | POA: Insufficient documentation

## 2014-10-12 DIAGNOSIS — Z86010 Personal history of colonic polyps: Secondary | ICD-10-CM | POA: Insufficient documentation

## 2014-10-12 DIAGNOSIS — R531 Weakness: Secondary | ICD-10-CM | POA: Diagnosis not present

## 2014-10-12 DIAGNOSIS — M545 Low back pain: Secondary | ICD-10-CM | POA: Diagnosis not present

## 2014-10-12 DIAGNOSIS — R160 Hepatomegaly, not elsewhere classified: Secondary | ICD-10-CM | POA: Insufficient documentation

## 2014-10-12 DIAGNOSIS — J189 Pneumonia, unspecified organism: Secondary | ICD-10-CM | POA: Diagnosis not present

## 2014-10-12 DIAGNOSIS — F329 Major depressive disorder, single episode, unspecified: Secondary | ICD-10-CM

## 2014-10-12 DIAGNOSIS — R4182 Altered mental status, unspecified: Secondary | ICD-10-CM | POA: Diagnosis not present

## 2014-10-12 DIAGNOSIS — E236 Other disorders of pituitary gland: Secondary | ICD-10-CM | POA: Diagnosis not present

## 2014-10-12 DIAGNOSIS — M810 Age-related osteoporosis without current pathological fracture: Secondary | ICD-10-CM | POA: Insufficient documentation

## 2014-10-12 DIAGNOSIS — I2510 Atherosclerotic heart disease of native coronary artery without angina pectoris: Secondary | ICD-10-CM | POA: Insufficient documentation

## 2014-10-12 DIAGNOSIS — I10 Essential (primary) hypertension: Secondary | ICD-10-CM | POA: Diagnosis not present

## 2014-10-12 DIAGNOSIS — Z7982 Long term (current) use of aspirin: Secondary | ICD-10-CM | POA: Insufficient documentation

## 2014-10-12 DIAGNOSIS — M797 Fibromyalgia: Secondary | ICD-10-CM | POA: Insufficient documentation

## 2014-10-12 DIAGNOSIS — R918 Other nonspecific abnormal finding of lung field: Secondary | ICD-10-CM | POA: Diagnosis not present

## 2014-10-12 DIAGNOSIS — E039 Hypothyroidism, unspecified: Secondary | ICD-10-CM | POA: Diagnosis not present

## 2014-10-12 DIAGNOSIS — M1990 Unspecified osteoarthritis, unspecified site: Secondary | ICD-10-CM | POA: Insufficient documentation

## 2014-10-12 DIAGNOSIS — M25562 Pain in left knee: Secondary | ICD-10-CM | POA: Diagnosis not present

## 2014-10-12 DIAGNOSIS — R270 Ataxia, unspecified: Secondary | ICD-10-CM | POA: Insufficient documentation

## 2014-10-12 DIAGNOSIS — G20 Parkinson's disease: Secondary | ICD-10-CM | POA: Insufficient documentation

## 2014-10-12 DIAGNOSIS — F411 Generalized anxiety disorder: Secondary | ICD-10-CM | POA: Diagnosis present

## 2014-10-12 DIAGNOSIS — M419 Scoliosis, unspecified: Secondary | ICD-10-CM | POA: Diagnosis not present

## 2014-10-12 DIAGNOSIS — G894 Chronic pain syndrome: Secondary | ICD-10-CM | POA: Insufficient documentation

## 2014-10-12 DIAGNOSIS — F32A Depression, unspecified: Secondary | ICD-10-CM

## 2014-10-12 DIAGNOSIS — R4189 Other symptoms and signs involving cognitive functions and awareness: Secondary | ICD-10-CM

## 2014-10-12 LAB — COMPREHENSIVE METABOLIC PANEL
ALBUMIN: 3.8 g/dL (ref 3.5–5.0)
ALT: <5 U/L — ABNORMAL LOW (ref 14–54)
ANION GAP: 11 (ref 5–15)
AST: 17 U/L (ref 15–41)
Alkaline Phosphatase: 62 U/L (ref 38–126)
BUN: 13 mg/dL (ref 6–20)
CHLORIDE: 99 mmol/L — AB (ref 101–111)
CO2: 29 mmol/L (ref 22–32)
CREATININE: 1 mg/dL (ref 0.44–1.00)
Calcium: 9.9 mg/dL (ref 8.9–10.3)
GFR calc Af Amer: >60 mL/min (ref >60)
GFR calc non Af Amer: 60 mL/min — ABNORMAL LOW (ref >60)
Glucose, Bld: 204 mg/dL — ABNORMAL HIGH (ref 65–99)
POTASSIUM: 4.3 mmol/L (ref 3.5–5.1)
Sodium: 139 mmol/L (ref 135–145)
Total Bilirubin: 0.4 mg/dL (ref 0.3–1.2)
Total Protein: 7.3 g/dL (ref 6.5–8.1)

## 2014-10-12 LAB — CBC
HEMATOCRIT: 39.8 % (ref 35.0–47.0)
HEMOGLOBIN: 13.2 g/dL (ref 12.0–16.0)
MCH: 29.5 pg (ref 26.0–34.0)
MCHC: 33.2 g/dL (ref 32.0–36.0)
MCV: 88.9 fL (ref 80.0–100.0)
Platelets: 273 10*3/uL (ref 150–440)
RBC: 4.47 MIL/uL (ref 3.80–5.20)
RDW: 13.1 % (ref 11.5–14.5)
WBC: 8.7 10*3/uL (ref 3.6–11.0)

## 2014-10-12 LAB — URINALYSIS COMPLETE WITH MICROSCOPIC (ARMC ONLY)
Bilirubin Urine: NEGATIVE
Glucose, UA: NEGATIVE mg/dL
NITRITE: NEGATIVE
PH: 6 (ref 5.0–8.0)
PROTEIN: NEGATIVE mg/dL
Specific Gravity, Urine: 1.02 (ref 1.005–1.030)

## 2014-10-12 LAB — TROPONIN I: Troponin I: <0.03 ng/mL (ref <0.031)

## 2014-10-12 LAB — MAGNESIUM: MAGNESIUM: 1.6 mg/dL — AB (ref 1.7–2.4)

## 2014-10-12 MED ORDER — LEVOFLOXACIN IN D5W 750 MG/150ML IV SOLN
750.0000 mg | Freq: Once | INTRAVENOUS | Status: AC
  Administered 2014-10-13: 750 mg via INTRAVENOUS

## 2014-10-12 NOTE — ED Notes (Signed)
Pt to triage via wheelchair.  Pt brought in by daughter.  Pt has declining health for 9 months, pt having difficulty with ADL.  Pt now wearing depends.  daughter unsure if she over medicating herself.  Pt  Answers questions appro.  Pt reports feeling weak.

## 2014-10-12 NOTE — ED Provider Notes (Signed)
[facility] Emergency Department Provider Note  ____________________________________________  Time seen: Approximately 9:27 PM  I have reviewed the triage vital signs and the nursing notes.   HISTORY  Chief Complaint Weakness and Altered Mental Status    HPI Lori Phillips is a 59 y.o. female with depression, diabetes, hypertension and GERD, coronary artery disease, bipolar disorder, hypothyroidism, presents for evaluation of significant cognitive decline the past 9 months, now with inability to perform most activities of daily living. Daughter at bedside reports that the patient now requires a walker, she is moving slowly, she is not eating well, she has only showered once in the past month. Patient reports that she is depressed however she denies suicidal ideation, homicidal ideation or audiovisual hallucinations. She was thought to initially have Parkinson's disease however had a recent MRI of her brain which was not consistent with Parkinson's and is scheduled to have additional neurocognitive testing to further evaluate the cause of her decline. She denies any recent illness including no cough, sneezing, runny nose, congestion, nausea, vomiting, diarrhea. Symptoms have been gradual in onset, worsening, constant, currently they are severe. No modifying factors.   Past Medical History  Diagnosis Date  . CAD (coronary artery disease)     multiple caths  . Diabetes mellitus   . Hyperlipidemia   . Hypertension   . Thyroid disease     hypothyroid  . Low back pain   . Nephrolithiasis   . Fibromyalgia   . Bipolar disorder   . Anxiety   . Panic attacks   . Enlarged liver   . Rheumatoid arthritis   . Enlarged pituitary gland   . Fibromyalgia   . Scoliosis   . Osteoporosis   . Osteoarthritis   . Kidney stones   . Hypothyroidism   . Allergy   . Parkinson's disease 2016    Patient Active Problem List   Diagnosis Date Noted  . DDD (degenerative disc  disease), lumbar 09/28/2014  . Facet syndrome, lumbar 09/28/2014  . Thoracic facet syndrome 09/28/2014  . Sacroiliac joint disease 09/24/2014  . Intercostal neuralgia 08/29/2014  . Progressive focal motor weakness 07/04/2014  . Abnormal drug screen 10/13/2013  . Routine general medical examination at a health care facility 08/26/2013  . Knee pain, left 08/26/2013  . Personal history of colonic polyps 10/03/2011  . Gastroparesis due to DM 10/01/2011  . Chronic pain syndrome 02/21/2010  . Hypothyroidism 11/26/2009  . Type 2 diabetes, uncontrolled, with gastroparesis 11/26/2009  . HYPERLIPIDEMIA 11/26/2009  . BIPOLAR DISORDER UNSPECIFIED 11/26/2009  . ANXIETY 11/26/2009  . HYPERTENSION 11/26/2009  . CORONARY ARTERY DISEASE 11/26/2009  . LOW BACK PAIN 11/26/2009  . NEPHROLITHIASIS, HX OF 11/26/2009    Past Surgical History  Procedure Laterality Date  . Vaginal hysterectomy    . Tonsillectomy    . Colonoscopy  multiple    2005 [place] records not available-polyps x 3  . Cardiac catheterization      x3  . Esophageal dilation    . Kidney stone removal      Current Outpatient Rx  Name  Route  Sig  Dispense  Refill  . amitriptyline (ELAVIL) 10 MG tablet   Oral   Take by mouth at bedtime as needed.          . ARIPiprazole (ABILIFY) 15 MG tablet   Oral   Take 15 mg by mouth at bedtime.         Marland Kitchen aspirin 81 MG tablet   Oral  Take 81 mg by mouth daily.         . B-D UF III MINI PEN NEEDLES 31G X 5 MM MISC      USE AS DIRECTED Patient not taking: Reported on 09/28/2014   100 each   0   . B-D ULTRAFINE III SHORT PEN 31G X 8 MM MISC      USE AS DIRECTED   100 each   5   . Blood Glucose Monitoring Suppl (ONE TOUCH BASIC SYSTEM) W/DEVICE KIT      Check blood sugar three times a day and as directed. Dx 250.00   1 each   0   . clonazePAM (KLONOPIN) 0.5 MG tablet   Oral   Take 1 tablet by mouth daily as needed.          . CRESTOR 20 MG tablet      TAKE  1 TABLET BY MOUTH DAILY   30 tablet   0     Office visit with labs required for additional ref ...   . diazepam (VALIUM) 5 MG tablet   Oral   Take 5 mg by mouth daily.         Marland Kitchen diltiazem (CARDIZEM CD) 180 MG 24 hr capsule      TAKE ONE CAPSULE BY MOUTH EVERY DAY   90 capsule   0   . divalproex (DEPAKOTE ER) 500 MG 24 hr tablet      TAKE 1 TABLET BY MOUTH TWICE DAILY   60 tablet   0   . DULoxetine (CYMBALTA) 60 MG capsule      TAKE 1 CAPSULE BY MOUTH EVERY DAY   90 capsule   0   . gabapentin (NEURONTIN) 100 MG capsule      Limited 1-2 tablets by mouth twice a day to 3 times a day if tolerated   180 capsule   0   . glipiZIDE-metformin (METAGLIP) 2.5-500 MG per tablet      TAKE 2 TABLETS BY MOUTH TWICE DAILY BEFORE MEALS   120 tablet   0   . LANTUS SOLOSTAR 100 UNIT/ML Solostar Pen      INJECT 35 UNITS UNDER THE SKIN EVERY NIGHT AT BEDTIME   15 mL   0     LAST REFILL. PT NEEDS APPT FOR FURTHER REFILLS.   Marland Kitchen levothyroxine (SYNTHROID, LEVOTHROID) 137 MCG tablet   Oral   Take 1 tablet (137 mcg total) by mouth daily before breakfast. Patient not taking: Reported on 09/28/2014   30 tablet   3     New strength   . levothyroxine (SYNTHROID, LEVOTHROID) 150 MCG tablet   Oral   Take 150 mcg by mouth daily before breakfast.         . levothyroxine (SYNTHROID, LEVOTHROID) 175 MCG tablet      TAKE 1 TABLET BY MOUTH EVERY MORNING BEFORE BREAKFAST Patient not taking: Reported on 09/28/2014   90 tablet   3   . lisinopril (PRINIVIL,ZESTRIL) 5 MG tablet      TAKE 1 TABLET BY MOUTH EVERY DAY.   30 tablet   5   . ONE TOUCH ULTRA TEST test strip      USE AS DIRECTED TO CHECK BLOOD SUGAR THREE TIMES DAILY AND AS DIRECTED   300 each   0   . ONETOUCH DELICA LANCETS 26J MISC      CHECK BLOOD SUGAR THREE TIMES DAILY AND AS DIRECTED   300 each   0   .  Oxycodone HCl 10 MG TABS      Limited 1 tab by mouth twice a day to 3 times a day if tolerated   90  tablet   0     Fill on or after 01/03/14   . pantoprazole (PROTONIX) 40 MG tablet      TAKE 1 TABLET BY MOUTH EVERY DAY.   90 tablet   0   . VICTOZA 18 MG/3ML SOPN      INJECT 1.$RemoveB'8MG'IFnJWjPm$  UNDER THE SKIN EVERY DAY AS DIRECTED   9 mL   1   . vitamin B-12 (CYANOCOBALAMIN) 500 MCG tablet   Oral   Take 500 mcg by mouth daily.           Allergies Byetta 10 mcg pen; Glyburide; Quetiapine; and Seroquel   Family History  Problem Relation Age of Onset  . Hypertension Mother   . Diabetes Mother   . Bipolar disorder Father   . Colon cancer Paternal Aunt   . Colon cancer Paternal Grandmother   . Thyroid disease Neg Hx     Social History History  Substance Use Topics  . Smoking status: Never Smoker   . Smokeless tobacco: Never Used  . Alcohol Use: Yes     Comment: socially    Review of Systems Constitutional: No fever/chills Eyes: No visual changes. ENT: No sore throat. Cardiovascular: Denies chest pain. Respiratory: Denies shortness of breath. Gastrointestinal: No abdominal pain.  No nausea, no vomiting.  No diarrhea.  No constipation. Genitourinary: Negative for dysuria. Musculoskeletal: Negative for back pain. Skin: Negative for rash. Neurological: Negative for headaches, focal weakness or numbness.  10-point ROS otherwise negative.  ____________________________________________   PHYSICAL EXAM:  VITAL SIGNS: ED Triage Vitals  Enc Vitals Group     BP 10/12/14 2015 133/67 mmHg     Pulse Rate 10/12/14 2015 84     Resp 10/12/14 2015 20     Temp 10/12/14 2015 97.7 F (36.5 C)     Temp Source 10/12/14 2015 Oral     SpO2 10/12/14 2015 96 %     Weight 10/12/14 2015 175 lb (79.379 kg)     Height 10/12/14 2015 $RemoveBefor'5\' 4"'ZxOkSIpvYUeo$  (1.626 m)     Head Cir --      Peak Flow --      Pain Score --      Pain Loc --      Pain Edu? --      Excl. in [place]? --     Constitutional: Alert and oriented. Chronically ill appearing, nontoxic appearing, appears comfortable. Eyes: Conjunctivae  are normal. PERRL. EOMI. Head: Atraumatic. Nose: No congestion/rhinnorhea. Mouth/Throat: Mucous membranes are moist.  Oropharynx non-erythematous. Neck: No stridor.  Cardiovascular: Normal rate, regular rhythm. Grossly normal heart sounds.  Good peripheral circulation. Respiratory: Normal respiratory effort.  No retractions. Lungs CTAB. Gastrointestinal: Soft and nontender. No distention. No abdominal bruits. No CVA tenderness. Genitourinary: deferred Musculoskeletal: No lower extremity tenderness nor edema.  No joint effusions. Neurologic:  Normal speech and language. No gross focal neurologic deficits are appreciated. Speech is normal.  5 out of 5 strength in bilateral upper and lower extremities Skin: no rash noted. Warm, dry, intact. Psychiatric: Mood is depressed and affect is flat. Speech and behavior are normal.  ____________________________________________   LABS (all labs ordered are listed, but only abnormal results are displayed)  Labs Reviewed  COMPREHENSIVE METABOLIC PANEL - Abnormal; Notable for the following:    Chloride 99 (*)    Glucose, Bld 204 (*)  ALT <5 (*)    GFR calc non Af Amer 60 (*)    All other components within normal limits  URINALYSIS COMPLETEWITH MICROSCOPIC ([facility] ONLY) - Abnormal; Notable for the following:    Color, Urine YELLOW (*)    APPearance CLEAR (*)    Ketones, ur TRACE (*)    Hgb urine dipstick 1+ (*)    Leukocytes, UA 3+ (*)    Bacteria, UA RARE (*)    Squamous Epithelial / LPF 6-30 (*)    All other components within normal limits  MAGNESIUM - Abnormal; Notable for the following:    Magnesium 1.6 (*)    All other components within normal limits  CULTURE, BLOOD (ROUTINE X 2)  CULTURE, BLOOD (ROUTINE X 2)  CBC  TROPONIN I  CBG MONITORING, ED   ____________________________________________  EKG  ED ECG REPORT I, Joanne Gavel, the attending physician, personally viewed and interpreted this ECG.   Date: 10/12/2014  EKG  Time: 20:27  Rate: 89  Rhythm: sinus rhythm with Richard supraventricular complexes  Axis: Normal  Intervals: QTc prolonged at 581 ms  ST&T Change: No acute ST segment elevation, T-wave flattening in inferior leads, V4 through V6.  ____________________________________________  RADIOLOGY  CXR  IMPRESSION: Mild right-sided mid lung opacity could reflect atelectasis or possibly mild pneumonia.  CT head IMPRESSION: Generalized atrophy and chronic small vessel ischemic changes.  No acute intracranial process. ____________________________________________   PROCEDURES  Procedure(s) performed: None  Critical Care performed: No  ____________________________________________   INITIAL IMPRESSION / ASSESSMENT AND PLAN / ED COURSE  Pertinent labs & imaging results that were available during my care of the patient were reviewed by me and considered in my medical decision making (see chart for details).   Lori Phillips is a 59 y.o. female with depression, diabetes, hypertension and GERD, coronary artery disease, bipolar disorder, hypothyroidism, presents for evaluation of significant cognitive decline the past 9 months, now with inability to perform most activities of daily living. On arrival to the emergency Department, vital signs stable, she is afebrile. She does appear depressed with flat affect but the remainder of her exam is unremarkable. She has no focal neurological deficit. No suicidal ideation, homicidal ideation, auditory or visual hallucinations or indication for commitment. Will obtain screening labs, EKG, chest x-ray, CT head and urinalysis. Will consult psychiatry, physical therapy and social work as the patient may need placement/rehab or evaluation for in-home care as she is no longer able to perform basic activities of daily living at this time.  ----------------------------------------- 11:45 PM on 10/12/2014 -----------------------------------------  Urinalysis  appears consistent with infection. PNA on  chest x-ray which certainly could account for poor energy and inability to perform activities of daily living. We'll obtain blood cultures, give IV Levaquin. Discussed with hospitalist for admission. ____________________________________________   FINAL CLINICAL IMPRESSION(S) / ED DIAGNOSES  Final diagnoses:  Cognitive decline  Depression  UTI (lower urinary tract infection)  Community acquired pneumonia      Joanne Gavel, MD 10/12/14 2347

## 2014-10-12 NOTE — Telephone Encounter (Signed)
Amber pts daughter said pt cannot walk without a lot of assistance with walker and takes pt 10 mins to get to bathroom. Pt does not have w/c. Pt can feed herself but she falls asleep with food in her mouth. Pt has not showered but once in the past month. Pt has to have assistance to get out of bed; pt has to be pulled up out of bed. Pt has worsened since seen 07/04/14. Amber said pt does not have the money for copay until the 3rd of July. Amber is not sure if pt is taking med correctly. Amber said pt had to be hospitalized 3-4 years ago to be detoxed. Amber said pt is depressed; and Amber is not sure if pt is suicidal or not; advised Amber if concerned about pt being suicidal needs to take to ED now for eval. Amber feels like pt has given up on life and is slowly dying. Amber said pt wants her to take care of her every need but Amber said she has to work. Amber will take pt to ED for eval.

## 2014-10-13 ENCOUNTER — Encounter: Payer: Self-pay | Admitting: Internal Medicine

## 2014-10-13 DIAGNOSIS — J189 Pneumonia, unspecified organism: Secondary | ICD-10-CM | POA: Diagnosis not present

## 2014-10-13 DIAGNOSIS — R531 Weakness: Secondary | ICD-10-CM | POA: Diagnosis not present

## 2014-10-13 DIAGNOSIS — N390 Urinary tract infection, site not specified: Secondary | ICD-10-CM | POA: Diagnosis not present

## 2014-10-13 DIAGNOSIS — E039 Hypothyroidism, unspecified: Secondary | ICD-10-CM | POA: Diagnosis not present

## 2014-10-13 DIAGNOSIS — F3132 Bipolar disorder, current episode depressed, moderate: Secondary | ICD-10-CM

## 2014-10-13 LAB — GLUCOSE, CAPILLARY
Glucose-Capillary: 175 mg/dL — ABNORMAL HIGH (ref 65–99)
Glucose-Capillary: 104 mg/dL — ABNORMAL HIGH (ref 65–99)
Glucose-Capillary: 179 mg/dL — ABNORMAL HIGH (ref 65–99)
Glucose-Capillary: 152 mg/dL — ABNORMAL HIGH (ref 65–99)
Glucose-Capillary: 183 mg/dL — ABNORMAL HIGH (ref 65–99)

## 2014-10-13 MED ORDER — ASPIRIN 81 MG PO CHEW
81.0000 mg | CHEWABLE_TABLET | Freq: Every day | ORAL | Status: DC
  Administered 2014-10-13 – 2014-10-14 (×2): 81 mg via ORAL
  Filled 2014-10-13 (×2): qty 1

## 2014-10-13 MED ORDER — VITAMIN B-12 500 MCG PO TABS: 500.0000 ug | ORAL_TABLET | Freq: Every day | ORAL | Status: DC

## 2014-10-13 MED ORDER — DULOXETINE HCL 60 MG PO CPEP
60.0000 mg | ORAL_CAPSULE | Freq: Every day | ORAL | Status: DC
  Administered 2014-10-13 – 2014-10-14 (×2): 60 mg via ORAL
  Filled 2014-10-13 (×2): qty 1

## 2014-10-13 MED ORDER — DIVALPROEX SODIUM ER 500 MG PO TB24
500.0000 mg | ORAL_TABLET | Freq: Two times a day (BID) | ORAL | Status: DC
  Administered 2014-10-13 – 2014-10-14 (×3): 500 mg via ORAL
  Filled 2014-10-13 (×6): qty 1

## 2014-10-13 MED ORDER — ACETAMINOPHEN 650 MG RE SUPP: 650.0000 mg | Freq: Four times a day (QID) | RECTAL | Status: DC | PRN

## 2014-10-13 MED ORDER — LEVOTHYROXINE SODIUM 137 MCG PO TABS
137.0000 ug | ORAL_TABLET | Freq: Every day | ORAL | Status: DC
  Administered 2014-10-13 – 2014-10-14 (×2): 137 ug via ORAL
  Filled 2014-10-13 (×3): qty 1

## 2014-10-13 MED ORDER — DIAZEPAM 5 MG PO TABS
5.0000 mg | ORAL_TABLET | Freq: Every day | ORAL | Status: DC
  Administered 2014-10-13 – 2014-10-14 (×2): 5 mg via ORAL
  Filled 2014-10-13 (×2): qty 1

## 2014-10-13 MED ORDER — ONDANSETRON HCL 4 MG/2ML IJ SOLN: 4.0000 mg | Freq: Four times a day (QID) | INTRAMUSCULAR | Status: DC | PRN

## 2014-10-13 MED ORDER — ROSUVASTATIN CALCIUM 20 MG PO TABS
20.0000 mg | ORAL_TABLET | Freq: Every day | ORAL | Status: DC
  Administered 2014-10-13 – 2014-10-14 (×2): 20 mg via ORAL
  Filled 2014-10-13 (×2): qty 1

## 2014-10-13 MED ORDER — INSULIN GLARGINE 100 UNIT/ML ~~LOC~~ SOLN
15.0000 [IU] | Freq: Every day | SUBCUTANEOUS | Status: DC
  Administered 2014-10-13 (×2): 15 [IU] via SUBCUTANEOUS
  Filled 2014-10-13 (×3): qty 0.15

## 2014-10-13 MED ORDER — AMITRIPTYLINE HCL 10 MG PO TABS
10.0000 mg | ORAL_TABLET | Freq: Every evening | ORAL | Status: DC | PRN
  Filled 2014-10-13: qty 1

## 2014-10-13 MED ORDER — GABAPENTIN 100 MG PO CAPS
100.0000 mg | ORAL_CAPSULE | Freq: Three times a day (TID) | ORAL | Status: DC
  Administered 2014-10-13 – 2014-10-14 (×4): 100 mg via ORAL
  Filled 2014-10-13 (×4): qty 1

## 2014-10-13 MED ORDER — INSULIN ASPART 100 UNIT/ML ~~LOC~~ SOLN
0.0000 [IU] | Freq: Three times a day (TID) | SUBCUTANEOUS | Status: DC
  Administered 2014-10-13 (×2): 3 [IU] via SUBCUTANEOUS
  Administered 2014-10-14: 5 [IU] via SUBCUTANEOUS
  Administered 2014-10-14: 2 [IU] via SUBCUTANEOUS
  Filled 2014-10-13: qty 2
  Filled 2014-10-13: qty 3
  Filled 2014-10-13: qty 5
  Filled 2014-10-13: qty 1

## 2014-10-13 MED ORDER — LEVOFLOXACIN IN D5W 750 MG/150ML IV SOLN
INTRAVENOUS | Status: AC
  Filled 2014-10-13: qty 150

## 2014-10-13 MED ORDER — ACETAMINOPHEN 325 MG PO TABS: 650.0000 mg | ORAL_TABLET | Freq: Four times a day (QID) | ORAL | Status: DC | PRN

## 2014-10-13 MED ORDER — LEVOFLOXACIN 500 MG PO TABS
250.0000 mg | ORAL_TABLET | ORAL | Status: DC
  Administered 2014-10-13: 250 mg via ORAL
  Filled 2014-10-13: qty 1

## 2014-10-13 MED ORDER — DOCUSATE SODIUM 100 MG PO CAPS
100.0000 mg | ORAL_CAPSULE | Freq: Two times a day (BID) | ORAL | Status: DC
  Administered 2014-10-13 – 2014-10-14 (×2): 100 mg via ORAL
  Filled 2014-10-13 (×4): qty 1

## 2014-10-13 MED ORDER — ARIPIPRAZOLE 10 MG PO TABS
15.0000 mg | ORAL_TABLET | Freq: Every day | ORAL | Status: DC
  Administered 2014-10-13 (×2): 15 mg via ORAL
  Filled 2014-10-13 (×2): qty 2

## 2014-10-13 MED ORDER — VITAMIN B-12 100 MCG PO TABS
500.0000 ug | ORAL_TABLET | Freq: Every day | ORAL | Status: DC
  Administered 2014-10-13 – 2014-10-14 (×2): 500 ug via ORAL
  Filled 2014-10-13 (×3): qty 5

## 2014-10-13 MED ORDER — LISINOPRIL 5 MG PO TABS
5.0000 mg | ORAL_TABLET | Freq: Every day | ORAL | Status: DC
  Administered 2014-10-13 – 2014-10-14 (×2): 5 mg via ORAL
  Filled 2014-10-13 (×2): qty 1

## 2014-10-13 MED ORDER — ONDANSETRON HCL 4 MG PO TABS: 4.0000 mg | ORAL_TABLET | Freq: Four times a day (QID) | ORAL | Status: DC | PRN

## 2014-10-13 MED ORDER — PANTOPRAZOLE SODIUM 40 MG PO TBEC
40.0000 mg | DELAYED_RELEASE_TABLET | Freq: Every day | ORAL | Status: DC
  Administered 2014-10-13 – 2014-10-14 (×2): 40 mg via ORAL
  Filled 2014-10-13 (×2): qty 1

## 2014-10-13 MED ORDER — HEPARIN SODIUM (PORCINE) 5000 UNIT/ML IJ SOLN
5000.0000 [IU] | Freq: Three times a day (TID) | INTRAMUSCULAR | Status: DC
  Administered 2014-10-13 – 2014-10-14 (×5): 5000 [IU] via SUBCUTANEOUS
  Filled 2014-10-13 (×5): qty 1

## 2014-10-13 MED ORDER — DILTIAZEM HCL ER COATED BEADS 180 MG PO CP24
180.0000 mg | ORAL_CAPSULE | Freq: Every day | ORAL | Status: DC
  Administered 2014-10-13 – 2014-10-14 (×2): 180 mg via ORAL
  Filled 2014-10-13 (×2): qty 1

## 2014-10-13 MED ORDER — CLONAZEPAM 0.5 MG PO TABS: 0.5000 mg | ORAL_TABLET | Freq: Three times a day (TID) | ORAL | Status: DC | PRN

## 2014-10-13 NOTE — Telephone Encounter (Signed)
Lm on Ambers vm and advised. Per pt chart, pt was taken to ED

## 2014-10-13 NOTE — Evaluation (Signed)
Physical Therapy Evaluation Patient Details Name: Lori Phillips MRN: [id number] DOB: 12-27-1955 Today's Date: 10/13/2014   History of Present Illness  Patient is a 59 y/o female with a progressive history of LE weakness that presents with complaints of worsening LE strength and was found to have UTI. Patient states she was recently diagnosed with Parkinson's though there is some skepticism with this diagnosis. Patient reports 4 separate falls during the past year.   Clinical Impression  Patient is a pleasant 59 y/o female that presents from home with progressive LE weakness and a history of multiple falls. Patient today presents with markedly reduced gait speed, gait distance tolerance, and poor motor planning/execution. During ambulation she struggles to move her LLE, as it "doesn't want to move". This appears to be more neurologic (motor planning?) in nature as she has good volitional strength and is able to symmetrically arise sit <--> stand from both commode and bed. At this time patient is not able to safely return to her home and would benefit from skilled rehabilitation services to increase her safety and independence with mobility. Skilled acute PT services are indicated to address the above deficits.     Follow Up Recommendations SNF    Equipment Recommendations       Recommendations for Other Services       Precautions / Restrictions Precautions Precautions: Fall Restrictions Weight Bearing Restrictions: No      Mobility  Bed Mobility Overal bed mobility: Needs Assistance Bed Mobility: Supine to Sit     Supine to sit: Min assist     General bed mobility comments: Patient takes prolonged time and requires assistance with bringing legs over the side of the bed.   Transfers Overall transfer level: Needs assistance Equipment used: Rolling walker (2 wheeled) Transfers: Sit to/from Stand Sit to Stand: Mod assist         General transfer comment: Patient required mod  A x1 to transfer from bed to standing, however one elevated commode she required min A x1.   Ambulation/Gait Ambulation/Gait assistance: Min guard Ambulation Distance (Feet): 3 Feet Assistive device: Rolling walker (2 wheeled) Gait Pattern/deviations: Decreased step length - right;Decreased step length - left;Festinating;Wide base of support     General Gait Details: Patient has either poor motor planning or delayed motor skills with her LLE>RLE. She states she attempts to move her left leg, but it doesn't want to move. This appears to be more neurologic in origin than muscular, as she displays appropriate strength on MMT.   Stairs            Wheelchair Mobility    Modified Rankin (Stroke Patients Only)       Balance Overall balance assessment: Needs assistance Sitting-balance support: Feet unsupported Sitting balance-Leahy Scale: Fair Sitting balance - Comments: Patient leans to the left in sitting, but is able to dangle indepenendently.  Postural control: Left lateral lean Standing balance support: Bilateral upper extremity supported Standing balance-Leahy Scale: Fair Standing balance comment: Patient is able to stand without holding on to the walker for diaper change.                              Pertinent Vitals/Pain Pain Assessment:  (Patient does not complain of any pain during session.)    Home Living Family/patient expects to be discharged to:: Private residence Living Arrangements: Children ([place] children) Available Help at Discharge: Family Type of Home: House Home Access: Stairs to enter  Entrance Stairs-Rails: Right Entrance Stairs-Number of Steps: 4 Home Layout: One level Home Equipment: Walker - 4 wheels      Prior Function Level of Independence: Independent with assistive device(s)         Comments: Requires some assistance to transfer in and out of bed from daughter most recently. No longer drives.      Hand Dominance         Extremity/Trunk Assessment   Upper Extremity Assessment: Overall WFL for tasks assessed           Lower Extremity Assessment: Overall WFL for tasks assessed      Cervical / Trunk Assessment: Kyphotic  Communication   Communication: No difficulties  Cognition Arousal/Alertness: Awake/alert Behavior During Therapy: WFL for tasks assessed/performed;Flat affect Overall Cognitive Status: Within Functional Limits for tasks assessed                      General Comments      Exercises        Assessment/Plan    PT Assessment Patient needs continued PT services  PT Diagnosis Difficulty walking;Generalized weakness   PT Problem List Decreased strength;Decreased activity tolerance;Decreased balance;Decreased mobility  PT Treatment Interventions Gait training;Therapeutic activities;Therapeutic exercise;Balance training;Neuromuscular re-education;Stair training;Patient/family education   PT Goals (Current goals can be found in the Care Plan section) Acute Rehab PT Goals Patient Stated Goal: To improve her safety with mobility  PT Goal Formulation: With patient Time For Goal Achievement: 10/27/14 Potential to Achieve Goals: Fair    Frequency Min 2X/week   Barriers to discharge Inaccessible home environment 4 stairs to enter     Co-evaluation               End of Session Equipment Utilized During Treatment: Gait belt Activity Tolerance: Patient tolerated treatment well Patient left: in chair;with call bell/phone within reach;with chair alarm set;with nursing/sitter in room Nurse Communication: Mobility status    Functional Assessment Tool Used: Clinical judgement  Functional Limitation: Mobility: Walking and moving around Mobility: Walking and Moving Around Current Status [phone]): At least 60 percent but less than 80 percent impaired, limited or restricted Mobility: Walking and Moving Around Goal Status [phone]): At least 60 percent but less than 80 percent  impaired, limited or restricted    Time: 0853-0923 PT Time Calculation (min) (ACUTE ONLY): 30 min   Charges:   PT Evaluation $Initial PT Evaluation Tier I: 1 Procedure PT Treatments $Therapeutic Activity: 8-22 mins   PT G Codes:   PT G-Codes **NOT FOR INPATIENT CLASS** Functional Assessment Tool Used: Clinical judgement  Functional Limitation: Mobility: Walking and moving around Mobility: Walking and Moving Around Current Status ([id number]): At least 60 percent but less than 80 percent impaired, limited or restricted Mobility: Walking and Moving Around Goal Status [phone]): At least 60 percent but less than 80 percent impaired, limited or restricted    Kerman Passey, PT, DPT   10/13/2014, 9:41 AM

## 2014-10-13 NOTE — Consult Note (Signed)
[facility] Face-to-Face Psychiatry Consult   Reason for Consult:  Consult for this 59 year old woman with multiple medical problems and a history of mood disorder. Consult for "depression" Referring Physician:  Manuella Ghazi Patient Identification: Lori Phillips MRN:  [id number] Principal Diagnosis: <principal problem not specified> Diagnosis:  Major depression recurrent versus bipolar disorder Patient Active Problem List   Diagnosis Date Noted  . Urinary tract infection [N39.0] 10/13/2014  . Generalized weakness [R53.1] 10/13/2014  . DDD (degenerative disc disease), lumbar [M51.36] 09/28/2014  . Facet syndrome, lumbar [M54.5] 09/28/2014  . Thoracic facet syndrome [M53.84] 09/28/2014  . Sacroiliac joint disease [M43.28] 09/24/2014  . Intercostal neuralgia [G54.8] 08/29/2014  . Progressive focal motor weakness [M62.81] 07/04/2014  . Abnormal drug screen [R89.2] 10/13/2013  . Routine general medical examination at a health care facility [Z00.00] 08/26/2013  . Knee pain, left [M25.562] 08/26/2013  . Personal history of colonic polyps [Z86.010] 10/03/2011  . Gastroparesis due to DM [E11.43] 10/01/2011  . Chronic pain syndrome [G89.4] 02/21/2010  . Hypothyroidism [E03.9] 11/26/2009  . Type 2 diabetes, uncontrolled, with gastroparesis [E11.43, K31.84, E11.65] 11/26/2009  . HYPERLIPIDEMIA [E78.5] 11/26/2009  . Bipolar disorder [F31.9] 11/26/2009  . Anxiety state [F41.1] 11/26/2009  . HYPERTENSION [I10] 11/26/2009  . CORONARY ARTERY DISEASE [I25.10] 11/26/2009  . LOW BACK PAIN [M54.5] 11/26/2009  . NEPHROLITHIASIS, HX OF [Z87.442] 11/26/2009    Total Time spent with patient: 1 hour  Subjective:   Lori Phillips is a 59 y.o. female patient admitted with diffuse weakness worsening over the past few months patient's chief complaint "my youngest daughter thought I was suicidal".  HPI:  Information from the patient and the chart. Patient is a 59 year old woman with a history of multiple medical problems  including coronary artery disease diabetes chronic pain. She was brought to the hospital by her daughter who had concerns that the patient has grown more week and appeared to be more depressed. The patient told me that she was under the impression that her youngest daughter thought that she was suicidal although I don't see that specifically reported in the notes. Patient herself says that she thinks it's natural that her mood would be down because of her multiple medical problems. She says she sleeps well during the daytime getting about 8 hours of sleep a day but doesn't sleep at night. She likes it that way. Appetite has been good. She admits to some irritability but has justifications for it. Denies any hallucinations. Denies any suicidal Thought whatsoever. She says that she is taking her medication prescribed by her outpatient psychiatrist. She and the daughter who are currently in the room agree that they think that her depression is well controlled right now.  Past psychiatric history: Long history of depression and also a diagnosis of bipolar disorder although I don't see any history of mania. Currently sees Dr. Randel Books for outpatient psychiatric medication and is on Effexor and Abilify. Has been on multiple other medicines in the past with mixed results. Last psychiatric hospitalization was years ago. Has had 3 in her lifetime. Had 1 past suicide attempt around 1999.  Social history: Patient's youngest daughter and that daughter's daughter and another boy all live in the patient's house. She clearly finds the situation irritating. Patient has been married 4 times. Not currently married.  Medical history: Multiple medical problems including coronary artery disease diabetes chronic pain. He simply in the past several months she has lost the use of her legs. She has seen doctors for this and it's not  yet clear what the full diagnosis is. She has recently been diagnosed by at least one practitioner as  having Parkinson's disease.  Substance abuse history: Patient denies that she abuses any drugs or alcohol. One of her past hospitalizations years ago was labeled as polysubstance detox. Nevertheless she has been on chronic pain medicine and antianxiety medicine for years.  Family history: Positive for depression including a father who committed suicide. HPI Elements:   Quality:  Depressed mood. Severity:  Moderate and chronic without suicidal ideation. Timing:  Going on for years stable in the last few months. Duration:  Chronic. Context:  Multiple medical problems and chronic pain..  Past Medical History:  Past Medical History  Diagnosis Date  . CAD (coronary artery disease)     multiple caths  . Diabetes mellitus   . Hyperlipidemia   . Hypertension   . Thyroid disease     hypothyroid  . Low back pain   . Nephrolithiasis   . Fibromyalgia   . Bipolar disorder   . Anxiety   . Panic attacks   . Enlarged liver   . Rheumatoid arthritis   . Enlarged pituitary gland   . Fibromyalgia   . Scoliosis   . Osteoporosis   . Osteoarthritis   . Kidney stones   . Hypothyroidism   . Allergy   . Parkinson's disease 2016    Past Surgical History  Procedure Laterality Date  . Vaginal hysterectomy    . Tonsillectomy    . Colonoscopy  multiple    2005 [place] records not available-polyps x 3  . Cardiac catheterization      x3  . Esophageal dilation    . Kidney stone removal     Family History:  Family History  Problem Relation Age of Onset  . Hypertension Mother   . Diabetes Mother   . Bipolar disorder Father   . Colon cancer Paternal Aunt   . Colon cancer Paternal Grandmother   . Thyroid disease Neg Hx   . Hypertension Father    Social History:  History  Alcohol Use  . Yes    Comment: socially     History  Drug Use No    History   Social History  . Marital Status: Single    Spouse Name: N/A  . Number of Children: 3  . Years of Education: N/A   Occupational  History  . disability    .     Social History Main Topics  . Smoking status: Never Smoker   . Smokeless tobacco: Never Used  . Alcohol Use: Yes     Comment: socially  . Drug Use: No  . Sexual Activity: Not on file   Other Topics Concern  . None   Social History Narrative   Has a living will- desires CPR but no prolonged life support if futile.   Additional Social History:                          Allergies:   Allergies  Allergen Reactions  . Byetta 10 Mcg Pen [Exenatide] Nausea And Vomiting    Other reaction(s): Dizziness and giddiness (finding) incoherent  . Glyburide     Other reaction(s): Dizziness and giddiness (finding), Other (qualifier value) confusion  . Quetiapine   . Seroquel  [Quetiapine Fumarate]     Other reaction(s): Diarrhea and vomiting (finding)    Labs:  Results for orders placed or performed during the hospital encounter of  10/12/14 (from the past 48 hour(s))  Comprehensive metabolic panel     Status: Abnormal   Collection Time: 10/12/14  8:31 PM  Result Value Ref Range   Sodium 139 135 - 145 mmol/L   Potassium 4.3 3.5 - 5.1 mmol/L   Chloride 99 (L) 101 - 111 mmol/L   CO2 29 22 - 32 mmol/L   Glucose, Bld 204 (H) 65 - 99 mg/dL   BUN 13 6 - 20 mg/dL   Creatinine, Ser 1.00 0.44 - 1.00 mg/dL   Calcium 9.9 8.9 - 10.3 mg/dL   Total Protein 7.3 6.5 - 8.1 g/dL   Albumin 3.8 3.5 - 5.0 g/dL   AST 17 15 - 41 U/L   ALT <5 (L) 14 - 54 U/L   Alkaline Phosphatase 62 38 - 126 U/L   Total Bilirubin 0.4 0.3 - 1.2 mg/dL   GFR calc non Af Amer 60 (L) >60 mL/min   GFR calc Af Amer >60 >60 mL/min    Comment: (NOTE) The eGFR has been calculated using the CKD EPI equation. This calculation has not been validated in all clinical situations. eGFR's persistently <60 mL/min signify possible Chronic Kidney Disease.    Anion gap 11 5 - 15  CBC     Status: None   Collection Time: 10/12/14  8:31 PM  Result Value Ref Range   WBC 8.7 3.6 - 11.0 K/uL   RBC  4.47 3.80 - 5.20 MIL/uL   Hemoglobin 13.2 12.0 - 16.0 g/dL   HCT 39.8 35.0 - 47.0 %   MCV 88.9 80.0 - 100.0 fL   MCH 29.5 26.0 - 34.0 pg   MCHC 33.2 32.0 - 36.0 g/dL   RDW 13.1 11.5 - 14.5 %   Platelets 273 150 - 440 K/uL  Troponin I     Status: None   Collection Time: 10/12/14  8:31 PM  Result Value Ref Range   Troponin I <0.03 <0.031 ng/mL    Comment:        NO INDICATION OF MYOCARDIAL INJURY.   Magnesium     Status: Abnormal   Collection Time: 10/12/14  8:31 PM  Result Value Ref Range   Magnesium 1.6 (L) 1.7 - 2.4 mg/dL  Urinalysis complete, with microscopic ([facility] only)     Status: Abnormal   Collection Time: 10/12/14  8:32 PM  Result Value Ref Range   Color, Urine YELLOW (A) YELLOW   APPearance CLEAR (A) CLEAR   Glucose, UA NEGATIVE NEGATIVE mg/dL   Bilirubin Urine NEGATIVE NEGATIVE   Ketones, ur TRACE (A) NEGATIVE mg/dL   Specific Gravity, Urine 1.020 1.005 - 1.030   Hgb urine dipstick 1+ (A) NEGATIVE   pH 6.0 5.0 - 8.0   Protein, ur NEGATIVE NEGATIVE mg/dL   Nitrite NEGATIVE NEGATIVE   Leukocytes, UA 3+ (A) NEGATIVE   RBC / HPF 0-5 0 - 5 RBC/hpf   WBC, UA TOO NUMEROUS TO COUNT 0 - 5 WBC/hpf   Bacteria, UA RARE (A) NONE SEEN   Squamous Epithelial / LPF 6-30 (A) NONE SEEN   Mucous PRESENT   Glucose, capillary     Status: Abnormal   Collection Time: 10/13/14  1:50 AM  Result Value Ref Range   Glucose-Capillary 175 (H) 65 - 99 mg/dL  Glucose, capillary     Status: Abnormal   Collection Time: 10/13/14  7:22 AM  Result Value Ref Range   Glucose-Capillary 104 (H) 65 - 99 mg/dL  Glucose, capillary     Status:  Abnormal   Collection Time: 10/13/14 11:17 AM  Result Value Ref Range   Glucose-Capillary 179 (H) 65 - 99 mg/dL   Comment 1 Notify RN   Glucose, capillary     Status: Abnormal   Collection Time: 10/13/14  4:30 PM  Result Value Ref Range   Glucose-Capillary 152 (H) 65 - 99 mg/dL   Comment 1 Notify RN   Glucose, capillary     Status: Abnormal   Collection  Time: 10/13/14  9:10 PM  Result Value Ref Range   Glucose-Capillary 183 (H) 65 - 99 mg/dL   Comment 1 Notify RN     Vitals: Blood pressure 143/79, pulse 72, temperature 98.1 F (36.7 C), temperature source Oral, resp. rate 16, height $RemoveBe'5\' 4"'jOGgWcbbu$  (1.626 m), weight 77.883 kg (171 lb 11.2 oz), SpO2 99 %.  Risk to Self: Is patient at risk for suicide?: No Risk to Others:   Prior Inpatient Therapy:   Prior Outpatient Therapy:    Current Facility-Administered Medications  Medication Dose Route Frequency Provider Last Rate Last Dose  . acetaminophen (TYLENOL) tablet 650 mg  650 mg Oral Q6H PRN Harrie Foreman, MD       Or  . acetaminophen (TYLENOL) suppository 650 mg  650 mg Rectal Q6H PRN Harrie Foreman, MD      . amitriptyline (ELAVIL) tablet 10 mg  10 mg Oral QHS PRN Harrie Foreman, MD      . ARIPiprazole (ABILIFY) tablet 15 mg  15 mg Oral QHS Harrie Foreman, MD   15 mg at 10/13/14 0204  . aspirin chewable tablet 81 mg  81 mg Oral Daily Harrie Foreman, MD   81 mg at 10/13/14 1018  . clonazePAM (KLONOPIN) tablet 0.5 mg  0.5 mg Oral TID PRN Harrie Foreman, MD      . diazepam (VALIUM) tablet 5 mg  5 mg Oral Daily Harrie Foreman, MD   5 mg at 10/13/14 1018  . diltiazem (CARDIZEM CD) 24 hr capsule 180 mg  180 mg Oral Daily Harrie Foreman, MD   180 mg at 10/13/14 1018  . divalproex (DEPAKOTE ER) 24 hr tablet 500 mg  500 mg Oral BID Harrie Foreman, MD   500 mg at 10/13/14 1000  . docusate sodium (COLACE) capsule 100 mg  100 mg Oral BID Harrie Foreman, MD   100 mg at 10/13/14 1018  . DULoxetine (CYMBALTA) DR capsule 60 mg  60 mg Oral Daily Harrie Foreman, MD   60 mg at 10/13/14 1018  . gabapentin (NEURONTIN) capsule 100 mg  100 mg Oral TID Harrie Foreman, MD   100 mg at 10/13/14 1735  . heparin injection 5,000 Units  5,000 Units Subcutaneous 3 times per day Harrie Foreman, MD   5,000 Units at 10/13/14 1733  . insulin aspart (novoLOG) injection 0-15 Units  0-15 Units  Subcutaneous TID WC Harrie Foreman, MD   3 Units at 10/13/14 1736  . insulin glargine (LANTUS) injection 15 Units  15 Units Subcutaneous QHS Harrie Foreman, MD   15 Units at 10/13/14 0206  . levofloxacin (LEVAQUIN) tablet 250 mg  250 mg Oral Q24H Max Sane, MD   250 mg at 10/13/14 1733  . levothyroxine (SYNTHROID, LEVOTHROID) tablet 137 mcg  137 mcg Oral QAC breakfast Harrie Foreman, MD   137 mcg at 10/13/14 [phone]  . lisinopril (PRINIVIL,ZESTRIL) tablet 5 mg  5 mg Oral Daily Harrie Foreman, MD  5 mg at 10/13/14 1018  . ondansetron (ZOFRAN) tablet 4 mg  4 mg Oral Q6H PRN Harrie Foreman, MD       Or  . ondansetron [facility]) injection 4 mg  4 mg Intravenous Q6H PRN Harrie Foreman, MD      . pantoprazole (PROTONIX) EC tablet 40 mg  40 mg Oral Daily Harrie Foreman, MD   40 mg at 10/13/14 1018  . rosuvastatin (CRESTOR) tablet 20 mg  20 mg Oral Daily Harrie Foreman, MD   20 mg at 10/13/14 1018  . vitamin B-12 (CYANOCOBALAMIN) tablet 500 mcg  500 mcg Oral Daily Harrie Foreman, MD   500 mcg at 10/13/14 1018    Musculoskeletal: Strength & Muscle Tone: decreased Gait & Station: ataxic Patient leans: N/A  Psychiatric Specialty Exam: Physical Exam  Constitutional: She appears well-developed and well-nourished. She appears listless. She has a sickly appearance.  HENT:  Head: Normocephalic and atraumatic.  Eyes: Conjunctivae are normal. Pupils are equal, round, and reactive to light.  Neck: Normal range of motion.  Cardiovascular: Normal heart sounds.   Respiratory: Effort normal.  GI: Soft.  Musculoskeletal: Normal range of motion.  Neurological: She appears listless. She displays atrophy and tremor. She exhibits abnormal muscle tone.  Skin: Skin is warm and dry.  Psychiatric: Judgment and thought content normal. Her affect is blunt. Her speech is delayed. She is withdrawn. Cognition and memory are normal.    Review of Systems  Constitutional: Negative.   HENT:  Negative.   Eyes: Negative.   Respiratory: Negative.   Cardiovascular: Negative.   Gastrointestinal: Negative.   Musculoskeletal: Negative.   Skin: Negative.   Neurological: Negative.   Psychiatric/Behavioral: Positive for depression. Negative for suicidal ideas, hallucinations and substance abuse. The patient has insomnia. The patient is not nervous/anxious.     Blood pressure 143/79, pulse 72, temperature 98.1 F (36.7 C), temperature source Oral, resp. rate 16, height $RemoveBe'5\' 4"'TVnvMmqrI$  (1.626 m), weight 77.883 kg (171 lb 11.2 oz), SpO2 99 %.Body mass index is 29.46 kg/(m^2).  General Appearance: Disheveled and Guarded  Eye Contact::  Fair  Speech:  Slow  Volume:  Decreased  Mood:  Depressed  Affect:  Depressed  Thought Process:  Tangential  Orientation:  Full (Time, Place, and Person)  Thought Content:  Negative  Suicidal Thoughts:  No  Homicidal Thoughts:  No  Memory:  Immediate;   Good Recent;   Fair Remote;   Fair  Judgement:  Fair  Insight:  Fair  Psychomotor Activity:  Decreased  Concentration:  Fair  Recall:  [employer] of Knowledge:Fair  Language: Fair  Akathisia:  No  Handed:  Right  AIMS (if indicated):     Assets:  Desire for Improvement Housing Social Support  ADL's:  Impaired  Cognition: Impaired,  Mild  Sleep:      Medical Decision Making: Review of Psycho-Social Stressors (1), Review or order clinical lab tests (1), Established Problem, Worsening (2), Review of Medication Regimen & Side Effects (2) and Review of New Medication or Change in Dosage (2)  Treatment Plan Summary: Plan This is a patient with chronic depression and anxiety and mood instability. Multiple medical problems that clearly play into her problems with her mood as well. The patient is very certain that she has no current suicidal ideation. She does not appear to be psychotic. Given her long-standing problems and the multiple medication she has tried I think it would be counterproductive to try  changing medicine  at this point. She already has an outpatient psychiatrist she trusts and relies on. My suggestion would be to continue her Effexor and Abilify at current doses. She will follow-up with Dr. Randel Books. Supportive counseling completed. Patient agrees to plan.  Plan:  Patient does not meet criteria for psychiatric inpatient admission. Supportive therapy provided about ongoing stressors. Discussed crisis plan, support from social network, calling 911, coming to the Emergency Department, and calling Suicide Hotline. Disposition: I will follow up if she is still in the hospital after the weekend  Alethia Berthold 10/13/2014 9:28 PM

## 2014-10-13 NOTE — Clinical Social Work Note (Signed)
Patient and her daughter Lori Phillips had bed offers extended and have chosen [facility] in [place] has notified Broadus John at [facility] (at 4:25pm today) and he states patient can come over the weekend. Patient's daughter Lori Phillips has DSS APS number should it be needed in the future.  Shela Leff MSW,LcSWA [phone]

## 2014-10-13 NOTE — Progress Notes (Signed)
   10/13/14 1140  OT Time Calculation  OT Start Time (ACUTE ONLY) 1105  OT Stop Time (ACUTE ONLY) 1128  OT Time Calculation (min) 23 min  OT G-codes **NOT FOR INPATIENT CLASS**  Functional Assessment Tool Used clinical judgment  Functional Limitation Self care  Self Care Current Status (G9030) CK  Self Care Goal Status (B4996) CJ  OT General Charges  $OT Visit 1 Procedure  OT Evaluation  $Initial OT Evaluation Tier I 1 Procedure

## 2014-10-13 NOTE — Care Management Note (Signed)
Case Management Note  Patient Details  Name: Lori Phillips MRN: [id number] Date of Birth: [birth year]  Subjective/Objective:    PT recommending SNF. CSW made aware. Will follow progression.                 Action/Plan:   Expected Discharge Date:                  Expected Discharge Plan:     In-House Referral:     Discharge planning Services     Post Acute Care Choice:    Choice offered to:     DME Arranged:    DME Agency:     HH Arranged:    [place]:     Status of Service:     Medicare Important Message Given:  Yes Date Medicare IM Given:  10/13/14 Medicare IM give by:  Orvan July Date Additional Medicare IM Given:    Additional Medicare Important Message give by:     If discussed at [place] of Stay Meetings, dates discussed:    Additional Comments:  Jolly Mango, RN 10/13/2014, 2:00 PM

## 2014-10-13 NOTE — H&P (Signed)
Lori Phillips is an 59 y.o. female.   Chief Complaint: Generalized weakness HPI: The patient presents emergency department without complaints. Her daughter brought her for evaluation due to subjective worsening weakness, particularly in her lower extremities. The patient states this has been chronic for the last 6 months. She denies fever, nausea, vomiting, chest pain or shortness of breath. She admits that she has occasional diarrhea for which she wears diapers. She is able to clean herself. She is also able to complete most of her ADLs but needs help sometimes. She is her own medical decision maker and has financial agency as well. Incidentally, laboratory evaluation reveals a urinary tract infection. Combined these issues to the emergency department to call for admission.  Past Medical History  Diagnosis Date  . CAD (coronary artery disease)     multiple caths  . Diabetes mellitus   . Hyperlipidemia   . Hypertension   . Thyroid disease     hypothyroid  . Low back pain   . Nephrolithiasis   . Fibromyalgia   . Bipolar disorder   . Anxiety   . Panic attacks   . Enlarged liver   . Rheumatoid arthritis   . Enlarged pituitary gland   . Fibromyalgia   . Scoliosis   . Osteoporosis   . Osteoarthritis   . Kidney stones   . Hypothyroidism   . Allergy   . Parkinson's disease 2016    Past Surgical History  Procedure Laterality Date  . Vaginal hysterectomy    . Tonsillectomy    . Colonoscopy  multiple    2005 [place] records not available-polyps x 3  . Cardiac catheterization      x3  . Esophageal dilation    . Kidney stone removal      Family History  Problem Relation Age of Onset  . Hypertension Mother   . Diabetes Mother   . Bipolar disorder Father   . Colon cancer Paternal Aunt   . Colon cancer Paternal Grandmother   . Thyroid disease Neg Hx   . Hypertension Father    Social History:  reports that she has never smoked. She has never used smokeless tobacco. She  reports that she drinks alcohol. She reports that she does not use illicit drugs.  Allergies:  Allergies  Allergen Reactions  . Byetta 10 Mcg Pen [Exenatide] Nausea And Vomiting    Other reaction(s): Dizziness and giddiness (finding) incoherent  . Glyburide     Other reaction(s): Dizziness and giddiness (finding), Other (qualifier value) confusion  . Quetiapine   . Seroquel  [Quetiapine Fumarate]     Other reaction(s): Diarrhea and vomiting (finding)    Prior to Admission medications   Medication Sig Start Date End Date Taking? Authorizing Provider  amitriptyline (ELAVIL) 10 MG tablet Take by mouth at bedtime as needed.     Historical Provider, MD  ARIPiprazole (ABILIFY) 15 MG tablet Take 15 mg by mouth at bedtime.    Historical Provider, MD  aspirin 81 MG tablet Take 81 mg by mouth daily.    Historical Provider, MD  B-D UF III MINI PEN NEEDLES 31G X 5 MM MISC USE AS DIRECTED Patient not taking: Reported on 09/28/2014 08/01/14   Lucille Passy, MD  B-D ULTRAFINE III SHORT PEN 31G X 8 MM MISC USE AS DIRECTED 03/22/12   Lucille Passy, MD  Blood Glucose Monitoring Suppl (ONE [facility] BASIC SYSTEM) W/DEVICE KIT Check blood sugar three times a day and as directed. Dx 250.00  07/06/13   Lucille Passy, MD  clonazePAM (KLONOPIN) 0.5 MG tablet Take 1 tablet by mouth daily as needed.  09/20/12   Historical Provider, MD  CRESTOR 20 MG tablet TAKE 1 TABLET BY MOUTH DAILY 08/07/14   Lucille Passy, MD  diazepam (VALIUM) 5 MG tablet Take 5 mg by mouth daily.    Historical Provider, MD  diltiazem (CARDIZEM CD) 180 MG 24 hr capsule TAKE ONE CAPSULE BY MOUTH EVERY DAY 08/22/14   Lucille Passy, MD  divalproex (DEPAKOTE ER) 500 MG 24 hr tablet TAKE 1 TABLET BY MOUTH TWICE DAILY 03/22/12   Lucille Passy, MD  DULoxetine (CYMBALTA) 60 MG capsule TAKE 1 CAPSULE BY MOUTH EVERY DAY 08/01/14   Lucille Passy, MD  gabapentin (NEURONTIN) 100 MG capsule Limited 1-2 tablets by mouth twice a day to 3 times a day if tolerated 09/28/14   Mohammed Kindle, MD  glipiZIDE-metformin (METAGLIP) 2.5-500 MG per tablet TAKE 2 TABLETS BY MOUTH TWICE DAILY BEFORE MEALS 08/22/14   Lucille Passy, MD  LANTUS SOLOSTAR 100 UNIT/ML Solostar Pen INJECT 35 UNITS UNDER THE SKIN EVERY NIGHT AT BEDTIME 08/10/14   Philemon Kingdom, MD  levothyroxine (SYNTHROID, LEVOTHROID) 137 MCG tablet Take 1 tablet (137 mcg total) by mouth daily before breakfast. Patient not taking: Reported on 09/28/2014 09/27/14   Radhika P Phadke, MD  levothyroxine (SYNTHROID, LEVOTHROID) 150 MCG tablet Take 150 mcg by mouth daily before breakfast.    Historical Provider, MD  levothyroxine (SYNTHROID, LEVOTHROID) 175 MCG tablet TAKE 1 TABLET BY MOUTH EVERY MORNING BEFORE BREAKFAST Patient not taking: Reported on 09/28/2014 09/27/14   Lucille Passy, MD  lisinopril (PRINIVIL,ZESTRIL) 5 MG tablet TAKE 1 TABLET BY MOUTH EVERY DAY. 03/17/14   Lucille Passy, MD  ONE TOUCH ULTRA TEST test strip USE AS DIRECTED TO CHECK BLOOD SUGAR THREE TIMES DAILY AND AS DIRECTED 06/16/14   Lucille Passy, MD  [facility] DELICA LANCETS 75T MISC CHECK BLOOD SUGAR THREE TIMES DAILY AND AS DIRECTED 06/16/14   Lucille Passy, MD  Oxycodone HCl 10 MG TABS Limited 1 tab by mouth twice a day to 3 times a day if tolerated 09/28/14   Mohammed Kindle, MD  pantoprazole (PROTONIX) 40 MG tablet TAKE 1 TABLET BY MOUTH EVERY DAY. 05/09/14   Lucille Passy, MD  VICTOZA 18 MG/3ML SOPN INJECT 1.$RemoveBefor'8MG'rvimeRqaBYpM$  UNDER THE SKIN EVERY DAY AS DIRECTED 06/26/14   Philemon Kingdom, MD  vitamin B-12 (CYANOCOBALAMIN) 500 MCG tablet Take 500 mcg by mouth daily.    Historical Provider, MD     Results for orders placed or performed during the hospital encounter of 10/12/14 (from the past 48 hour(s))  Comprehensive metabolic panel     Status: Abnormal   Collection Time: 10/12/14  8:31 PM  Result Value Ref Range   Sodium 139 135 - 145 mmol/L   Potassium 4.3 3.5 - 5.1 mmol/L   Chloride 99 (L) 101 - 111 mmol/L   CO2 29 22 - 32 mmol/L   Glucose, Bld 204 (H) 65 - 99 mg/dL   BUN 13 6  - 20 mg/dL   Creatinine, Ser 1.00 0.44 - 1.00 mg/dL   Calcium 9.9 8.9 - 10.3 mg/dL   Total Protein 7.3 6.5 - 8.1 g/dL   Albumin 3.8 3.5 - 5.0 g/dL   AST 17 15 - 41 U/L   ALT <5 (L) 14 - 54 U/L   Alkaline Phosphatase 62 38 - 126 U/L   Total Bilirubin  0.4 0.3 - 1.2 mg/dL   GFR calc non Af Amer 60 (L) >60 mL/min   GFR calc Af Amer >60 >60 mL/min    Comment: (NOTE) The eGFR has been calculated using the CKD EPI equation. This calculation has not been validated in all clinical situations. eGFR's persistently <60 mL/min signify possible Chronic Kidney Disease.    Anion gap 11 5 - 15  CBC     Status: None   Collection Time: 10/12/14  8:31 PM  Result Value Ref Range   WBC 8.7 3.6 - 11.0 K/uL   RBC 4.47 3.80 - 5.20 MIL/uL   Hemoglobin 13.2 12.0 - 16.0 g/dL   HCT 39.8 35.0 - 47.0 %   MCV 88.9 80.0 - 100.0 fL   MCH 29.5 26.0 - 34.0 pg   MCHC 33.2 32.0 - 36.0 g/dL   RDW 13.1 11.5 - 14.5 %   Platelets 273 150 - 440 K/uL  Troponin I     Status: None   Collection Time: 10/12/14  8:31 PM  Result Value Ref Range   Troponin I <0.03 <0.031 ng/mL    Comment:        NO INDICATION OF MYOCARDIAL INJURY.   Magnesium     Status: Abnormal   Collection Time: 10/12/14  8:31 PM  Result Value Ref Range   Magnesium 1.6 (L) 1.7 - 2.4 mg/dL  Urinalysis complete, with microscopic ([facility] only)     Status: Abnormal   Collection Time: 10/12/14  8:32 PM  Result Value Ref Range   Color, Urine YELLOW (A) YELLOW   APPearance CLEAR (A) CLEAR   Glucose, UA NEGATIVE NEGATIVE mg/dL   Bilirubin Urine NEGATIVE NEGATIVE   Ketones, ur TRACE (A) NEGATIVE mg/dL   Specific Gravity, Urine 1.020 1.005 - 1.030   Hgb urine dipstick 1+ (A) NEGATIVE   pH 6.0 5.0 - 8.0   Protein, ur NEGATIVE NEGATIVE mg/dL   Nitrite NEGATIVE NEGATIVE   Leukocytes, UA 3+ (A) NEGATIVE   RBC / HPF 0-5 0 - 5 RBC/hpf   WBC, UA TOO NUMEROUS TO COUNT 0 - 5 WBC/hpf   Bacteria, UA RARE (A) NONE SEEN   Squamous Epithelial / LPF 6-30 (A) NONE  SEEN   Mucous PRESENT    Dg Chest 2 View  10/12/2014   CLINICAL DATA:  Worsening weakness.  Initial encounter.  EXAM: CHEST  2 VIEW  COMPARISON:  Chest radiograph from 02/03/2009  FINDINGS: The lungs are well-aerated. Mild right-sided midlung opacity could reflect atelectasis or mild pneumonia. There is no evidence of pleural effusion or pneumothorax.  The heart is normal in size; the mediastinal contour is within normal limits. No acute osseous abnormalities are seen.  IMPRESSION: Mild right-sided mid lung opacity could reflect atelectasis or possibly mild pneumonia.   Electronically Signed   By: Garald Balding M.D.   On: 10/12/2014 22:30   Ct Head Wo Contrast  10/12/2014   CLINICAL DATA:  Patient with weakness and altered mental status.  EXAM: CT HEAD WITHOUT CONTRAST  TECHNIQUE: Contiguous axial images were obtained from the base of the skull through the vertex without intravenous contrast.  COMPARISON:  MRI of brain 07/07/2014; CT 10/27/2008  FINDINGS: Ventricles and sulci are prominent compatible with atrophy. Periventricular and subcortical white matter hypodensity compatible with chronic small vessel ischemic changes. No evidence for acute cortically based infarct, intracranial hemorrhage, mass lesion or mass-effect. Orbits are unremarkable. Paranasal sinuses are unremarkable. Mastoid air cells are well aerated. Calvarium is intact.  IMPRESSION:  Generalized atrophy and chronic small vessel ischemic changes.  No acute intracranial process.   Electronically Signed   By: Lovey Newcomer M.D.   On: 10/12/2014 22:30    Review of Systems  Constitutional: Negative for fever and chills.  HENT: Negative for sore throat and tinnitus.   Eyes: Negative for blurred vision and redness.  Respiratory: Negative for cough and shortness of breath.   Cardiovascular: Negative for chest pain, palpitations, orthopnea and PND.  Gastrointestinal: Negative for nausea, vomiting, abdominal pain and diarrhea.  Genitourinary:  Negative for dysuria, urgency and frequency.  Musculoskeletal: Negative for myalgias and joint pain.  Skin: Negative for rash.       No lesions  Neurological: Negative for speech change, focal weakness and weakness.  Endo/Heme/Allergies: Does not bruise/bleed easily.       No temperature intolerance  Psychiatric/Behavioral: Negative for depression and suicidal ideas.    Blood pressure 157/89, pulse 84, temperature 97.7 F (36.5 C), temperature source Oral, resp. rate 16, height $RemoveBe'5\' 4"'MhdablLEw$  (1.626 m), weight 79.379 kg (175 lb), SpO2 95 %. Physical Exam  Nursing note and vitals reviewed. Constitutional: She is oriented to person, place, and time. She appears well-developed and well-nourished. No distress.  HENT:  Head: Normocephalic and atraumatic.  Mouth/Throat: Oropharynx is clear and moist.  Eyes: Conjunctivae and EOM are normal. Pupils are equal, round, and reactive to light. No scleral icterus.  Neck: Normal range of motion. Neck supple. No JVD present. No tracheal deviation present. No thyromegaly present.  Cardiovascular: Normal rate, regular rhythm, normal heart sounds and intact distal pulses.  Exam reveals no gallop and no friction rub.   No murmur heard. Respiratory: Effort normal and breath sounds normal.  GI: Soft. Bowel sounds are normal. She exhibits no distension. There is no tenderness.  Genitourinary:  Deferred  Musculoskeletal: Normal range of motion. She exhibits no edema.  Strength 5 out of 5 in the left lower extremity Strength 4+ out of 5 in the right lower extremity  Lymphadenopathy:    She has no cervical adenopathy.  Neurological: She is alert and oriented to person, place, and time. No cranial nerve deficit. She exhibits normal muscle tone.  Skin: Skin is warm and dry.  Psychiatric: She has a normal mood and affect. Her behavior is normal. Judgment and thought content normal.     Assessment/Plan This is a 59 year old Caucasian female admitted for urinary tract  infection and chronic weakness. 1. Urinary tract infection: In the emergency department the patient was worked up for sepsis. She has blood cultures pending. However she is hemodynamically stable and technically does not meet criteria for sepsis. She is received a dose of Levaquin in the emergency department. We may transition her to oral antibiotics to complete treatment. 2. Chronic weakness: Likely multifactorial. The patient appears deconditioned but also explains to me that she has a recent diagnosis of Parkinson's disease. I am mildly skeptical of this diagnosis although the patient has some features of parkinsonism. Nonetheless, she may need personal care services at home. I have ordered PT and OT evaluations. We will need discharge planning to help with home safety needs. 3. Hypothyroidism: The patient recently had outpatient labs obtained which revealed an elevated free T4. She also has 2 different doses of levothyroxine on her home medication list. I suspect that a recent change was made in her medication regimen to the lower dose based on previous lab data. I have continued the patient on Synthroid 137 g. 4. Pneumonia: Interpretation of the  chest x-ray by radiology suggested a possible pneumonia. Clinically the patient does not have pneumonia. We will continue to monitor her respiratory status.  5. DVT prophylaxis: Heparin 6. GI prophylaxis: Pantoprazole due to history of esophageal stricture with esophagitis The patient is a full code. Time spent on admission orders and patient care approximately 35 minutes  Harrie Foreman 10/13/2014, 12:52 AM

## 2014-10-13 NOTE — Evaluation (Signed)
Occupational Therapy Evaluation Patient Details Name: Lori Phillips MRN: [id number] DOB: 23-Feb-1956 Today's Date: 10/13/2014    History of Present Illness Patient is a 59 y/o female with a progressive history of LE weakness that presents with complaints of worsening LE strength and was found to have UTI. Patient states she was recently diagnosed with Parkinson's.  Patient reports 4 separate falls during the past month.  She reports she went to bed one night and woke up and couldn't move her legs to walk, felt really weak.    Clinical Impression   Patient is a 59 yo female who lives at home with her daughter and granddaughter in a one story home with 4 steps to enter.  She reports progressive weakness over the last month especially in her bilateral lower extremities and has had 4 falls in the last month.  She was recently diagnosed with Parkinson's disease and has mild bilateral tremors in her UEs which affects her feeding herself.  She is requiring increased assist with transfers and functional mobility and shows signs of hesistations/mild freezing behaviors with initiation of movement during evaluation.  She also requires increased assistance for self care tasks.  She would benefit from skilled OT to maximize her safety and independence in self care tasks and functional mobility to return to her prior status.  She would likely benefit from short term rehab prior to returning home.      Follow Up Recommendations  SNF    Equipment Recommendations  Other (comment) (continue to assess)    Recommendations for Other Services       Precautions / Restrictions Precautions Precautions: Fall Restrictions Weight Bearing Restrictions: No      Mobility Bed Mobility Overal bed mobility: Needs Assistance Bed Mobility: Supine to Sit     Supine to sit: Min assist     General bed mobility comments: Patient takes prolonged time and requires assistance with bringing legs over the side of the  bed.   Transfers Overall transfer level: Needs assistance Equipment used: Rolling walker (2 wheeled) Transfers: Sit to/from Stand Sit to Stand: Mod assist         General transfer comment: Patient required mod A x1 to transfer from bed to standing, however one elevated commode she required min A x1.     Balance Overall balance assessment: Needs assistance Sitting-balance support: Feet unsupported Sitting balance-Leahy Scale: Fair Sitting balance - Comments: Patient leans to the left in sitting, but is able to dangle indepenendently.  Postural control: Left lateral lean Standing balance support: Bilateral upper extremity supported Standing balance-Leahy Scale: Fair Standing balance comment: Patient is able to stand without holding on to the walker for diaper change.                             ADL Overall ADL's : Needs assistance/impaired Eating/Feeding: Set up (pt's tremors cause her to spill food frequently)   Grooming: Set up;Modified independent;Sitting       Lower Body Bathing: Moderate assistance   Upper Body Dressing : Minimal assistance   Lower Body Dressing: Moderate assistance   Toilet Transfer: Moderate assistance   Toileting- Clothing Manipulation and Hygiene: Minimal assistance   Tub/ Shower Transfer:  (sponge baths currently, shower t/f not tested this date.)   Functional mobility during ADLs: Min guard;Cueing for safety;Rolling walker       Vision     Perception     Praxis  Pertinent Vitals/Pain Pain Assessment: 0-10 Pain Score: 8  Pain Descriptors / Indicators: Aching Pain Intervention(s): Repositioned     Hand Dominance Right   Extremity/Trunk Assessment Upper Extremity Assessment Upper Extremity Assessment: Generalized weakness   Lower Extremity Assessment Lower Extremity Assessment: Defer to PT evaluation   Cervical / Trunk Assessment Cervical / Trunk Assessment: Kyphotic   Communication  Communication Communication: No difficulties   Cognition Arousal/Alertness: Awake/alert Behavior During Therapy: WFL for tasks assessed/performed;Flat affect Overall Cognitive Status: Within Functional Limits for tasks assessed                     General Comments       Exercises       Shoulder Instructions      Home Living Family/patient expects to be discharged to:: Private residence Living Arrangements: Children Available Help at Discharge: Family Type of Home: House Home Access: Stairs to enter Technical brewer of Steps: 4 Entrance Stairs-Rails: Right Home Layout: One level     Bathroom Shower/Tub: Walk-in shower (has been performing sponge baths the last 2 months)   Bathroom Toilet: Standard Bathroom Accessibility: Yes How Accessible: Accessible via walker Home Equipment: Walker - 4 wheels   Additional Comments: has a shoehorn to use for donning shoes.      Prior Functioning/Environment Level of Independence: Independent with assistive device(s)        Comments: Requires some assistance to transfer in and out of bed from daughter most recently. No longer drives.     OT Diagnosis: Generalized weakness;Other (comment) (decreased ADL status)   OT Problem List: Decreased strength;Impaired balance (sitting and/or standing);Pain;Decreased range of motion;Decreased safety awareness;Decreased knowledge of use of DME or AE;Impaired UE functional use   OT Treatment/Interventions: Self-care/ADL training;Therapeutic exercise;Patient/family education;Neuromuscular education;DME and/or AE instruction    OT Goals(Current goals can be found in the care plan section) Acute Rehab OT Goals Patient Stated Goal: "to be able to get around my house and take care of myself and my house." OT Goal Formulation: With patient Time For Goal Achievement: 10/20/14 Potential to Achieve Goals: Good  OT Frequency: Min 1X/week   Barriers to D/C:            Co-evaluation               End of Session Equipment Utilized During Treatment: Gait belt;Rolling walker  Activity Tolerance: Patient tolerated treatment well Patient left: in chair;with call bell/phone within reach;with chair alarm set   Time: 1607-3710 OT Time Calculation (min): 23 min Charges:  OT General Charges $OT Visit: 1 Procedure OT Evaluation $Initial OT Evaluation Tier I: 1 Procedure G-Codes:    Timmy Cleverly 08-Nov-2014, 11:40 AM
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
## 2014-10-13 NOTE — ED Notes (Addendum)
Pt brought in by daughter, per daughter pt has been experiencing decreased appetite and decreased activity - pt wearing depends and does not care for herself as she normally does. Pt's daughter states pt has hx of manic bipolar, depression and schizophrenia. Pt's daughter admits she is unable to adequately care for her mother and the patient has begun to not care for herself.

## 2014-10-13 NOTE — Telephone Encounter (Signed)
Agree with ER dispo. 

## 2014-10-13 NOTE — Progress Notes (Signed)
[place] [facility] [place] NAME: Lori Phillips    MR#:  [id number]  DATE OF BIRTH:  1956-02-25  SUBJECTIVE:  CHIEF COMPLAINT:   Chief Complaint  Patient presents with  . Weakness  . Altered Mental Status  just feels weak, REVIEW OF SYSTEMS:  Review of Systems  Constitutional: Positive for malaise/fatigue. Negative for fever, weight loss and diaphoresis.  HENT: Negative for ear discharge, ear pain, hearing loss, nosebleeds, sore throat and tinnitus.   Eyes: Negative for blurred vision and pain.  Respiratory: Negative for cough, hemoptysis, shortness of breath and wheezing.   Cardiovascular: Negative for chest pain, palpitations, orthopnea and leg swelling.  Gastrointestinal: Negative for heartburn, nausea, vomiting, abdominal pain, diarrhea, constipation and blood in stool.  Genitourinary: Negative for dysuria, urgency and frequency.  Musculoskeletal: Negative for myalgias and back pain.  Skin: Negative for itching and rash.  Neurological: Positive for weakness. Negative for dizziness, tingling, tremors, focal weakness, seizures and headaches.  Psychiatric/Behavioral: Negative for depression. The patient is not nervous/anxious.    DRUG ALLERGIES:   Allergies  Allergen Reactions  . Byetta 10 Mcg Pen [Exenatide] Nausea And Vomiting    Other reaction(s): Dizziness and giddiness (finding) incoherent  . Glyburide     Other reaction(s): Dizziness and giddiness (finding), Other (qualifier value) confusion  . Quetiapine   . Seroquel  [Quetiapine Fumarate]     Other reaction(s): Diarrhea and vomiting (finding)   VITALS:  Blood pressure 167/72, pulse 69, temperature 97.8 F (36.6 C), temperature source Oral, resp. rate 16, height 5\' 4"  (1.626 m), weight 77.883 kg (171 lb 11.2 oz), SpO2 95 %. PHYSICAL EXAMINATION:  Physical Exam  Constitutional: She is oriented to person, place, and time and well-developed, well-nourished, and in no distress.   HENT:  Head: Normocephalic and atraumatic.  Eyes: Conjunctivae and EOM are normal. Pupils are equal, round, and reactive to light.  Neck: Normal range of motion. Neck supple. No tracheal deviation present. No thyromegaly present.  Cardiovascular: Normal rate, regular rhythm and normal heart sounds.   Pulmonary/Chest: Effort normal and breath sounds normal. No respiratory distress. She has no wheezes. She exhibits no tenderness.  Abdominal: Soft. Bowel sounds are normal. She exhibits no distension. There is no tenderness.  Musculoskeletal: Normal range of motion.  Neurological: She is alert and oriented to person, place, and time. No cranial nerve deficit.  Skin: Skin is warm and dry. No rash noted.  Psychiatric: Mood and affect normal.   LABORATORY PANEL:   CBC  Recent Labs Lab 10/12/14 2031  WBC 8.7  HGB 13.2  HCT 39.8  PLT 273   ------------------------------------------------------------------------------------------------------------------ Chemistries   Recent Labs Lab 10/12/14 2031  NA 139  K 4.3  CL 99*  CO2 29  GLUCOSE 204*  BUN 13  CREATININE 1.00  CALCIUM 9.9  MG 1.6*  AST 17  ALT <5*  ALKPHOS 62  BILITOT 0.4   RADIOLOGY:  Dg Chest 2 View  10/12/2014   CLINICAL DATA:  Worsening weakness.  Initial encounter.  EXAM: CHEST  2 VIEW  COMPARISON:  Chest radiograph from 02/03/2009  FINDINGS: The lungs are well-aerated. Mild right-sided midlung opacity could reflect atelectasis or mild pneumonia. There is no evidence of pleural effusion or pneumothorax.  The heart is normal in size; the mediastinal contour is within normal limits. No acute osseous abnormalities are seen.  IMPRESSION: Mild right-sided mid lung opacity could reflect atelectasis or possibly mild pneumonia.   Electronically Signed   By:  Garald Balding M.D.   On: 10/12/2014 22:30   Ct Head Wo Contrast  10/12/2014   CLINICAL DATA:  Patient with weakness and altered mental status.  EXAM: CT HEAD WITHOUT  CONTRAST  TECHNIQUE: Contiguous axial images were obtained from the base of the skull through the vertex without intravenous contrast.  COMPARISON:  MRI of brain 07/07/2014; CT 10/27/2008  FINDINGS: Ventricles and sulci are prominent compatible with atrophy. Periventricular and subcortical white matter hypodensity compatible with chronic small vessel ischemic changes. No evidence for acute cortically based infarct, intracranial hemorrhage, mass lesion or mass-effect. Orbits are unremarkable. Paranasal sinuses are unremarkable. Mastoid air cells are well aerated. Calvarium is intact.  IMPRESSION: Generalized atrophy and chronic small vessel ischemic changes.  No acute intracranial process.   Electronically Signed   By: Lovey Newcomer M.D.   On: 10/12/2014 22:30   ASSESSMENT AND PLAN:  This is a 59 year old Caucasian female admitted for urinary tract infection and chronic weakness.  1. E. Coli Urinary tract infection: sepsis ruled out. Continue abx for now. Blood c/s neg. Urine c/s growing 85,000 colonies of e.coli   2. Chronic weakness: Likely multifactorial. The patient appears deconditioned & ?Parkinson's disease per pt - PT recommends STR/SNF  3. Hypothyroidism: continue on Synthroid 137 g.  4. Pneumonia: CXR is read it that way but clinically doubt it. No pna  5. DVT prophylaxis: Heparin 6. GI prophylaxis: Pantoprazole due to history of esophageal stricture with esophagitis  All the records are reviewed and case discussed with Care Management/Social Workerr. Management plans discussed with the patient, family and they are in agreement.  CODE STATUS: Full Code  TOTAL TIME TAKING CARE OF THIS PATIENT: 25 minutes.   More than 50% of the time was spent in counseling/coordination of care: YES  POSSIBLE D/C IN 1-2 DAYS, DEPENDING ON CLINICAL CONDITION. Needs STR/SNF per PT. SW aware.   [facility], Salah Burlison M.D on 10/13/2014 at 2:39 PM  Between 7am to 6pm - Pager - [phone]  After 6pm go to  [email or web address] - password [email or web address] [facility]  Office  [phone]  CC:  Primary care physician; Arnette Norris, MD

## 2014-10-14 DIAGNOSIS — E1121 Type 2 diabetes mellitus with diabetic nephropathy: Secondary | ICD-10-CM | POA: Diagnosis not present

## 2014-10-14 DIAGNOSIS — N390 Urinary tract infection, site not specified: Secondary | ICD-10-CM | POA: Diagnosis not present

## 2014-10-14 DIAGNOSIS — M069 Rheumatoid arthritis, unspecified: Secondary | ICD-10-CM | POA: Diagnosis not present

## 2014-10-14 DIAGNOSIS — E079 Disorder of thyroid, unspecified: Secondary | ICD-10-CM | POA: Diagnosis not present

## 2014-10-14 DIAGNOSIS — K3184 Gastroparesis: Secondary | ICD-10-CM | POA: Diagnosis not present

## 2014-10-14 DIAGNOSIS — E1143 Type 2 diabetes mellitus with diabetic autonomic (poly)neuropathy: Secondary | ICD-10-CM | POA: Diagnosis not present

## 2014-10-14 DIAGNOSIS — E785 Hyperlipidemia, unspecified: Secondary | ICD-10-CM | POA: Diagnosis not present

## 2014-10-14 DIAGNOSIS — Z7982 Long term (current) use of aspirin: Secondary | ICD-10-CM | POA: Diagnosis not present

## 2014-10-14 DIAGNOSIS — G894 Chronic pain syndrome: Secondary | ICD-10-CM | POA: Diagnosis not present

## 2014-10-14 DIAGNOSIS — E236 Other disorders of pituitary gland: Secondary | ICD-10-CM | POA: Diagnosis not present

## 2014-10-14 DIAGNOSIS — F419 Anxiety disorder, unspecified: Secondary | ICD-10-CM | POA: Diagnosis not present

## 2014-10-14 DIAGNOSIS — M545 Low back pain: Secondary | ICD-10-CM | POA: Diagnosis not present

## 2014-10-14 DIAGNOSIS — F411 Generalized anxiety disorder: Secondary | ICD-10-CM | POA: Diagnosis not present

## 2014-10-14 DIAGNOSIS — I2510 Atherosclerotic heart disease of native coronary artery without angina pectoris: Secondary | ICD-10-CM | POA: Diagnosis not present

## 2014-10-14 DIAGNOSIS — G20 Parkinson's disease: Secondary | ICD-10-CM | POA: Diagnosis not present

## 2014-10-14 DIAGNOSIS — M797 Fibromyalgia: Secondary | ICD-10-CM | POA: Diagnosis not present

## 2014-10-14 DIAGNOSIS — F3110 Bipolar disorder, current episode manic without psychotic features, unspecified: Secondary | ICD-10-CM | POA: Diagnosis not present

## 2014-10-14 DIAGNOSIS — R531 Weakness: Secondary | ICD-10-CM | POA: Diagnosis not present

## 2014-10-14 DIAGNOSIS — J189 Pneumonia, unspecified organism: Secondary | ICD-10-CM | POA: Diagnosis not present

## 2014-10-14 DIAGNOSIS — M419 Scoliosis, unspecified: Secondary | ICD-10-CM | POA: Diagnosis not present

## 2014-10-14 DIAGNOSIS — N3000 Acute cystitis without hematuria: Secondary | ICD-10-CM | POA: Diagnosis not present

## 2014-10-14 DIAGNOSIS — E784 Other hyperlipidemia: Secondary | ICD-10-CM | POA: Diagnosis not present

## 2014-10-14 DIAGNOSIS — E119 Type 2 diabetes mellitus without complications: Secondary | ICD-10-CM | POA: Diagnosis not present

## 2014-10-14 DIAGNOSIS — Z5189 Encounter for other specified aftercare: Secondary | ICD-10-CM | POA: Diagnosis not present

## 2014-10-14 DIAGNOSIS — R160 Hepatomegaly, not elsewhere classified: Secondary | ICD-10-CM | POA: Diagnosis not present

## 2014-10-14 DIAGNOSIS — I10 Essential (primary) hypertension: Secondary | ICD-10-CM | POA: Diagnosis not present

## 2014-10-14 DIAGNOSIS — R29898 Other symptoms and signs involving the musculoskeletal system: Secondary | ICD-10-CM | POA: Diagnosis not present

## 2014-10-14 DIAGNOSIS — M6281 Muscle weakness (generalized): Secondary | ICD-10-CM | POA: Diagnosis not present

## 2014-10-14 DIAGNOSIS — Z87442 Personal history of urinary calculi: Secondary | ICD-10-CM | POA: Diagnosis not present

## 2014-10-14 DIAGNOSIS — M1990 Unspecified osteoarthritis, unspecified site: Secondary | ICD-10-CM | POA: Diagnosis not present

## 2014-10-14 DIAGNOSIS — E039 Hypothyroidism, unspecified: Secondary | ICD-10-CM | POA: Diagnosis not present

## 2014-10-14 DIAGNOSIS — G933 Postviral fatigue syndrome: Secondary | ICD-10-CM | POA: Diagnosis not present

## 2014-10-14 DIAGNOSIS — R4182 Altered mental status, unspecified: Secondary | ICD-10-CM | POA: Diagnosis not present

## 2014-10-14 DIAGNOSIS — Z794 Long term (current) use of insulin: Secondary | ICD-10-CM | POA: Diagnosis not present

## 2014-10-14 DIAGNOSIS — R270 Ataxia, unspecified: Secondary | ICD-10-CM | POA: Diagnosis not present

## 2014-10-14 DIAGNOSIS — K210 Gastro-esophageal reflux disease with esophagitis: Secondary | ICD-10-CM | POA: Diagnosis not present

## 2014-10-14 DIAGNOSIS — F319 Bipolar disorder, unspecified: Secondary | ICD-10-CM | POA: Diagnosis not present

## 2014-10-14 DIAGNOSIS — M810 Age-related osteoporosis without current pathological fracture: Secondary | ICD-10-CM | POA: Diagnosis not present

## 2014-10-14 DIAGNOSIS — E1165 Type 2 diabetes mellitus with hyperglycemia: Secondary | ICD-10-CM | POA: Diagnosis not present

## 2014-10-14 LAB — GLUCOSE, CAPILLARY
Glucose-Capillary: 188 mg/dL — ABNORMAL HIGH (ref 65–99)
Glucose-Capillary: 137 mg/dL — ABNORMAL HIGH (ref 65–99)
Glucose-Capillary: 214 mg/dL — ABNORMAL HIGH (ref 65–99)

## 2014-10-14 MED ORDER — LEVOFLOXACIN 250 MG PO TABS: 250.0000 mg | ORAL_TABLET | ORAL | Status: DC

## 2014-10-14 MED ORDER — CLONAZEPAM 0.5 MG PO TABS: 0.5000 mg | ORAL_TABLET | Freq: Every day | ORAL | Status: DC | PRN

## 2014-10-14 MED ORDER — LISINOPRIL 5 MG PO TABS: 5.0000 mg | ORAL_TABLET | Freq: Every day | ORAL | Status: DC

## 2014-10-14 MED ORDER — AMITRIPTYLINE HCL 10 MG PO TABS: 10.0000 mg | ORAL_TABLET | Freq: Every evening | ORAL | Status: DC | PRN

## 2014-10-14 MED ORDER — GABAPENTIN 100 MG PO CAPS: ORAL_CAPSULE | ORAL | Status: DC

## 2014-10-14 MED ORDER — CYANOCOBALAMIN 500 MCG PO TABS: 500.0000 ug | ORAL_TABLET | Freq: Every day | ORAL | Status: DC

## 2014-10-14 MED ORDER — PANTOPRAZOLE SODIUM 40 MG PO TBEC: 40.0000 mg | DELAYED_RELEASE_TABLET | Freq: Every day | ORAL | Status: DC

## 2014-10-14 MED ORDER — ROSUVASTATIN CALCIUM 20 MG PO TABS: 20.0000 mg | ORAL_TABLET | Freq: Every day | ORAL | Status: DC

## 2014-10-14 MED ORDER — DULOXETINE HCL 60 MG PO CPEP: 60.0000 mg | ORAL_CAPSULE | Freq: Every day | ORAL | Status: DC

## 2014-10-14 MED ORDER — ARIPIPRAZOLE 15 MG PO TABS: 15.0000 mg | ORAL_TABLET | Freq: Every day | ORAL | Status: DC

## 2014-10-14 MED ORDER — LEVOTHYROXINE SODIUM 137 MCG PO TABS: 137.0000 ug | ORAL_TABLET | Freq: Every day | ORAL | Status: DC

## 2014-10-14 MED ORDER — ASPIRIN 81 MG PO TABS: 81.0000 mg | ORAL_TABLET | Freq: Every day | ORAL | Status: DC

## 2014-10-14 NOTE — Progress Notes (Signed)
order for discharge instructions were given to patient, understanding was verbalized, IV removed and intact, discharge by EMS with family

## 2014-10-14 NOTE — Discharge Instructions (Signed)
PT at [facility]

## 2014-10-14 NOTE — Clinical Social Work Placement (Signed)
   CLINICAL SOCIAL WORK PLACEMENT  NOTE  Date:  10/14/2014  Patient Details  Name: Lori Phillips MRN: [id number] Date of Birth: 02/13/1956  Clinical Social Work is seeking post-discharge placement for this patient at the [place] level of care (*CSW will initial, date and re-position this form in  chart as items are completed):  Yes   Patient/family provided with [place] Work Department's list of facilities offering this level of care within the geographic area requested by the patient (or if unable, by the patient's family).  Yes   Patient/family informed of their freedom to choose among providers that offer the needed level of care, that participate in Medicare, Medicaid or managed care program needed by the patient, have an available bed and are willing to accept the patient.  Yes   Patient/family informed of [place]'s ownership interest in [facility] and [facility], as well as of the fact that they are under no obligation to receive care at these facilities.  PASRR submitted to EDS on 10/13/14     PASRR number received on 10/13/14     Existing PASRR number confirmed on       FL2 transmitted to all facilities in geographic area requested by pt/family on 10/13/14     FL2 transmitted to all facilities within larger geographic area on       Patient informed that his/her managed care company has contracts with or will negotiate with certain facilities, including the following:        Yes   Patient/family informed of bed offers received.  Patient chooses bed at  ([facility] )     Physician recommends and patient chooses bed at      Patient to be transferred to  ([facility] ) on 10/14/14.  Patient to be transferred to facility by  (EMS )     Patient family notified on 10/14/14 of transfer.  Name of family member notified:   (Daughter [name] was at bedside. )     PHYSICIAN       Additional Comment:     _______________________________________________ Loralyn Freshwater, LCSW 10/14/2014, 12:23 PM

## 2014-10-14 NOTE — Progress Notes (Signed)
Patient is medically stable for D/C to [facility] today. Per [facility] liaision patient is going to room 127-A. RN will call report at [phone] and arrange EMS for transport. Clinical Education officer, museum (CSW) prepared D/C packet and faxed D/C summary to [facility] today. Patient's daughter [name] was at bedside and aware of above. Please reconsult if future social work needs arise. CSW signing off.   Blima Rich, [place] [phone]

## 2014-10-14 NOTE — Discharge Summary (Signed)
[place] [facility] [place] NAME: Lori Phillips    MR#:  [id number]  DATE OF BIRTH:  [birth year]  DATE OF ADMISSION:  10/12/2014 ADMITTING PHYSICIAN: Harrie Foreman, MD  DATE OF DISCHARGE: 10/14/2014  PRIMARY CARE PHYSICIAN: Arnette Norris, MD    ADMISSION DIAGNOSIS:  Depression [F32.9] UTI (lower urinary tract infection) [N39.0] Community acquired pneumonia [J18.9] Cognitive decline [R41.89]  DISCHARGE DIAGNOSIS:  UTI Generalized weakness  SECONDARY DIAGNOSIS:   Past Medical History  Diagnosis Date  . CAD (coronary artery disease)     multiple caths  . Diabetes mellitus   . Hyperlipidemia   . Hypertension   . Thyroid disease     hypothyroid  . Low back pain   . Nephrolithiasis   . Fibromyalgia   . Bipolar disorder   . Anxiety   . Panic attacks   . Enlarged liver   . Rheumatoid arthritis   . Enlarged pituitary gland   . Fibromyalgia   . Scoliosis   . Osteoporosis   . Osteoarthritis   . Kidney stones   . Hypothyroidism   . Allergy   . Parkinson's disease 2016    HOSPITAL COURSE:  59 year old Caucasian female admitted for urinary tract infection and chronic weakness.  1. E. Coli Urinary tract infection: sepsis ruled out. Continue abx for now. Blood c/s neg. Urine c/s growing 85,000 colonies of e.coli  -cont levaquin for total 7 days  2. Chronic weakness: Likely multifactorial. The patient appears deconditioned & ?Parkinson's disease per pt - PT recommends STR/SNF -cont sinemet  3. Hypothyroidism: continue on Synthroid 137 g.  4. Pneumonia: CXR is read it that way but clinically doubt it. No pna  5. DVT prophylaxis: Heparin  6. GI prophylaxis: Pantoprazole due to history of esophageal stricture with esophagitis  7.h/o depression/mood disorder -seen by dr clapacs -recommends cont her outpt meds abilify/elevi/cymbalta -f/u Dr Ernie Hew  8.Dm-2  -cont lantus  Clinically improving. spoke with family in the  room [place] to go to [facility] today  DISCHARGE CONDITIONS:   fair  CONSULTS OBTAINED:  Treatment Team:  Gonzella Lex, MD  DRUG ALLERGIES:   Allergies  Allergen Reactions  . Byetta 10 Mcg Pen [Exenatide] Nausea And Vomiting    Other reaction(s): Dizziness and giddiness (finding) incoherent  . Glyburide     Other reaction(s): Dizziness and giddiness (finding), Other (qualifier value) confusion  . Quetiapine   . Seroquel  [Quetiapine Fumarate]     Other reaction(s): Diarrhea and vomiting (finding)    DISCHARGE MEDICATIONS:   Current Discharge Medication List    START taking these medications   Details  levofloxacin (LEVAQUIN) 250 MG tablet Take 1 tablet (250 mg total) by mouth daily. Qty: 5 tablet, Refills: 0    !! vitamin B-12 500 MCG tablet Take 1 tablet (500 mcg total) by mouth daily. Qty: 30 tablet, Refills: 0     !! - Potential duplicate medications found. Please discuss with provider.    CONTINUE these medications which have CHANGED   Details  !! amitriptyline (ELAVIL) 10 MG tablet Take 1 tablet (10 mg total) by mouth at bedtime as needed for sleep. Qty: 30 tablet, Refills: 0    !! amitriptyline (ELAVIL) 10 MG tablet Take 1 tablet (10 mg total) by mouth at bedtime as needed. Qty: 30 tablet, Refills: 0    ARIPiprazole (ABILIFY) 15 MG tablet Take 1 tablet (15 mg total) by mouth at bedtime. Qty: 30 tablet, Refills: 0  clonazePAM (KLONOPIN) 0.5 MG tablet Take 1 tablet (0.5 mg total) by mouth daily as needed. Qty: 30 tablet, Refills: 0    DULoxetine (CYMBALTA) 60 MG capsule Take 1 capsule (60 mg total) by mouth daily. Qty: 90 capsule, Refills: 0    !! gabapentin (NEURONTIN) 100 MG capsule Limited 1-2 tablets by mouth twice a day to 3 times a day if tolerated Qty: 180 capsule, Refills: 0    !! levothyroxine (SYNTHROID, LEVOTHROID) 137 MCG tablet Take 1 tablet (137 mcg total) by mouth daily before breakfast. Qty: 30 tablet, Refills: 3    lisinopril  (PRINIVIL,ZESTRIL) 5 MG tablet Take 1 tablet (5 mg total) by mouth daily. Qty: 30 tablet, Refills: 5    pantoprazole (PROTONIX) 40 MG tablet Take 1 tablet (40 mg total) by mouth daily. Qty: 90 tablet, Refills: 0    rosuvastatin (CRESTOR) 20 MG tablet Take 1 tablet (20 mg total) by mouth daily. Qty: 30 tablet, Refills: 0     !! - Potential duplicate medications found. Please discuss with provider.    CONTINUE these medications which have NOT CHANGED   Details  aspirin EC 81 MG tablet Take 81 mg by mouth daily.    carbidopa-levodopa (SINEMET IR) 25-100 MG per tablet Take 1 tablet by mouth 3 (three) times daily.    diazepam (VALIUM) 5 MG tablet Take 5 mg by mouth at bedtime.     diltiazem (CARDIZEM CD) 180 MG 24 hr capsule TAKE ONE CAPSULE BY MOUTH EVERY DAY Qty: 90 capsule, Refills: 0    divalproex (DEPAKOTE ER) 500 MG 24 hr tablet TAKE 1 TABLET BY MOUTH TWICE DAILY Qty: 60 tablet, Refills: 0    !! gabapentin (NEURONTIN) 100 MG capsule Take 100-200 mg by mouth 3 (three) times daily. 2-3 times daily if tolerated    LANTUS SOLOSTAR 100 UNIT/ML Solostar Pen INJECT 35 UNITS UNDER THE SKIN EVERY NIGHT AT BEDTIME Qty: 15 mL, Refills: 0    !! levothyroxine (SYNTHROID, LEVOTHROID) 137 MCG tablet Take 137 mcg by mouth daily before breakfast.    Oxycodone HCl 10 MG TABS Limited 1 tab by mouth twice a day to 3 times a day if tolerated Qty: 90 tablet, Refills: 0    !! vitamin B-12 (CYANOCOBALAMIN) 500 MCG tablet Take 500 mcg by mouth daily.     !! - Potential duplicate medications found. Please discuss with provider.    STOP taking these medications     aspirin 81 MG tablet        If you experience worsening of your admission symptoms, develop shortness of breath, life threatening emergency, suicidal or homicidal thoughts you must seek medical attention immediately by calling 911 or calling your MD immediately  if symptoms less severe.  You Must read complete instructions/literature  along with all the possible adverse reactions/side effects for all the Medicines you take and that have been prescribed to you. Take any new Medicines after you have completely understood and accept all the possible adverse reactions/side effects.   Please note  You were cared for by a hospitalist during your hospital stay. If you have any questions about your discharge medications or the care you received while you were in the hospital after you are discharged, you can call the unit and asked to speak with the hospitalist on call if the hospitalist that took care of you is not available. Once you are discharged, your primary care physician will handle any further medical issues. Please note that NO REFILLS for any discharge  medications will be authorized once you are discharged, as it is imperative that you return to your primary care physician (or establish a relationship with a primary care physician if you do not have one) for your aftercare needs so that they can reassess your need for medications and monitor your lab values. Today   SUBJECTIVE   feeling ok. weak  VITAL SIGNS:  Blood pressure 117/43, pulse 44, temperature 97.5 F (36.4 C), temperature source Oral, resp. rate 16, height 5\' 4"  (1.626 m), weight 79.017 kg (174 lb 3.2 oz), SpO2 96 %.  I/O:   Intake/Output Summary (Last 24 hours) at 10/14/14 0944 Last data filed at 10/14/14 0839  Gross per 24 hour  Intake   1230 ml  Output   1750 ml  Net   -520 ml    PHYSICAL EXAMINATION:  GENERAL:  59 y.o.-year-old patient lying in the bed with no acute distress. Obese EYES: Pupils equal, round, reactive to light and accommodation. No scleral icterus. Extraocular muscles intact.  HEENT: Head atraumatic, normocephalic. Oropharynx and nasopharynx clear.  NECK:  Supple, no jugular venous distention. No thyroid enlargement, no tenderness.  LUNGS: Normal breath sounds bilaterally, no wheezing, rales,rhonchi or crepitation. No use of  accessory muscles of respiration.  CARDIOVASCULAR: S1, S2 normal. No murmurs, rubs, or gallops.  ABDOMEN: Soft, non-tender, non-distended. Bowel sounds present. No organomegaly or mass.  EXTREMITIES: No pedal edema, cyanosis, or clubbing.  NEUROLOGIC: Cranial nerves II through XII are intact. Muscle strength 5/5 in all extremities. Sensation intact. Gait not checked.  PSYCHIATRIC: The patient is alert and oriented x 3.  SKIN: No obvious rash, lesion  DATA REVIEW:   CBC   Recent Labs Lab 10/12/14 2031  WBC 8.7  HGB 13.2  HCT 39.8  PLT 273    Chemistries   Recent Labs Lab 10/12/14 2031  NA 139  K 4.3  CL 99*  CO2 29  GLUCOSE 204*  BUN 13  CREATININE 1.00  CALCIUM 9.9  MG 1.6*  AST 17  ALT <5*  ALKPHOS 62  BILITOT 0.4    Microbiology Results   Recent Results (from the past 240 hour(s))  Blood culture (routine x 2)     Status: None (Preliminary result)   Collection Time: 10/13/14 12:20 AM  Result Value Ref Range Status   Specimen Description BLOOD  Final   Special Requests Normal  Final   Culture NO GROWTH 1 DAY  Final   Report Status PENDING  Incomplete  Blood culture (routine x 2)     Status: None (Preliminary result)   Collection Time: 10/13/14 12:35 AM  Result Value Ref Range Status   Specimen Description BLOOD  Final   Special Requests Normal  Final   Culture NO GROWTH 1 DAY  Final   Report Status PENDING  Incomplete    RADIOLOGY:  Dg Chest 2 View  10/12/2014   CLINICAL DATA:  Worsening weakness.  Initial encounter.  EXAM: CHEST  2 VIEW  COMPARISON:  Chest radiograph from 02/03/2009  FINDINGS: The lungs are well-aerated. Mild right-sided midlung opacity could reflect atelectasis or mild pneumonia. There is no evidence of pleural effusion or pneumothorax.  The heart is normal in size; the mediastinal contour is within normal limits. No acute osseous abnormalities are seen.  IMPRESSION: Mild right-sided mid lung opacity could reflect atelectasis or  possibly mild pneumonia.   Electronically Signed   By: Garald Balding M.D.   On: 10/12/2014 22:30   Ct Head Wo Contrast  10/12/2014   CLINICAL DATA:  Patient with weakness and altered mental status.  EXAM: CT HEAD WITHOUT CONTRAST  TECHNIQUE: Contiguous axial images were obtained from the base of the skull through the vertex without intravenous contrast.  COMPARISON:  MRI of brain 07/07/2014; CT 10/27/2008  FINDINGS: Ventricles and sulci are prominent compatible with atrophy. Periventricular and subcortical white matter hypodensity compatible with chronic small vessel ischemic changes. No evidence for acute cortically based infarct, intracranial hemorrhage, mass lesion or mass-effect. Orbits are unremarkable. Paranasal sinuses are unremarkable. Mastoid air cells are well aerated. Calvarium is intact.  IMPRESSION: Generalized atrophy and chronic small vessel ischemic changes.  No acute intracranial process.   Electronically Signed   By: Lovey Newcomer M.D.   On: 10/12/2014 22:30     Management plans discussed with the patient, family and they are in agreement.  CODE STATUS:     Code Status Orders        Start     Ordered   10/13/14 0138  Full code   Continuous     10/13/14 0137      TOTAL TIME TAKING CARE OF THIS PATIENT: 18minutes.    Iram Lundberg M.D on 10/14/2014 at 9:44 AM  Between 7am to 6pm - Pager - [phone] After 6pm go to [email or web address] - password [email or web address] [place]  Office  [phone]  CC: Primary care physician; Arnette Norris, MD

## 2014-10-17 DIAGNOSIS — F419 Anxiety disorder, unspecified: Secondary | ICD-10-CM | POA: Diagnosis not present

## 2014-10-17 DIAGNOSIS — M545 Low back pain: Secondary | ICD-10-CM | POA: Diagnosis not present

## 2014-10-17 DIAGNOSIS — I2510 Atherosclerotic heart disease of native coronary artery without angina pectoris: Secondary | ICD-10-CM | POA: Diagnosis not present

## 2014-10-17 DIAGNOSIS — K210 Gastro-esophageal reflux disease with esophagitis: Secondary | ICD-10-CM | POA: Diagnosis not present

## 2014-10-18 LAB — CULTURE, BLOOD (ROUTINE X 2)
Culture: NO GROWTH
Culture: NO GROWTH
SPECIAL REQUESTS: NORMAL
Special Requests: NORMAL

## 2014-10-26 ENCOUNTER — Ambulatory Visit: Payer: Medicare Other | Admitting: Pain Medicine

## 2014-10-26 DIAGNOSIS — K210 Gastro-esophageal reflux disease with esophagitis: Secondary | ICD-10-CM | POA: Diagnosis not present

## 2014-10-26 DIAGNOSIS — E119 Type 2 diabetes mellitus without complications: Secondary | ICD-10-CM | POA: Diagnosis not present

## 2014-10-26 DIAGNOSIS — G933 Postviral fatigue syndrome: Secondary | ICD-10-CM | POA: Diagnosis not present

## 2014-10-26 DIAGNOSIS — I2510 Atherosclerotic heart disease of native coronary artery without angina pectoris: Secondary | ICD-10-CM | POA: Diagnosis not present

## 2014-10-27 ENCOUNTER — Encounter: Payer: Medicare Other | Admitting: Neurology

## 2014-11-06 DIAGNOSIS — E119 Type 2 diabetes mellitus without complications: Secondary | ICD-10-CM | POA: Diagnosis not present

## 2014-11-06 DIAGNOSIS — E039 Hypothyroidism, unspecified: Secondary | ICD-10-CM | POA: Diagnosis not present

## 2014-11-06 DIAGNOSIS — I10 Essential (primary) hypertension: Secondary | ICD-10-CM | POA: Diagnosis not present

## 2014-11-06 DIAGNOSIS — F319 Bipolar disorder, unspecified: Secondary | ICD-10-CM | POA: Diagnosis not present

## 2014-11-07 ENCOUNTER — Encounter: Payer: Self-pay | Admitting: Internal Medicine

## 2014-11-13 ENCOUNTER — Telehealth: Payer: Self-pay | Admitting: Family Medicine

## 2014-11-13 ENCOUNTER — Ambulatory Visit (INDEPENDENT_AMBULATORY_CARE_PROVIDER_SITE_OTHER): Payer: Medicare Other | Admitting: Neurology

## 2014-11-13 ENCOUNTER — Ambulatory Visit (INDEPENDENT_AMBULATORY_CARE_PROVIDER_SITE_OTHER): Payer: Self-pay | Admitting: Neurology

## 2014-11-13 DIAGNOSIS — R531 Weakness: Secondary | ICD-10-CM

## 2014-11-13 DIAGNOSIS — Z0289 Encounter for other administrative examinations: Secondary | ICD-10-CM

## 2014-11-13 DIAGNOSIS — R29898 Other symptoms and signs involving the musculoskeletal system: Secondary | ICD-10-CM | POA: Diagnosis not present

## 2014-11-13 DIAGNOSIS — G259 Extrapyramidal and movement disorder, unspecified: Secondary | ICD-10-CM | POA: Insufficient documentation

## 2014-11-13 NOTE — Progress Notes (Signed)
See procedure note.

## 2014-11-13 NOTE — Procedures (Signed)
[facility]    Provider:  Dr Jaynee Eagles Referring Provider: Lucille Passy, MD Primary Care Physician:  Arnette Norris, MD  History:  Lori Phillips is a 59 y.o. female here as a referral from Dr. Deborra Medina for lower extremity weakness. Left is worse than the right. She has LBP and radicular symptoms. She is here with daughter. Patient has been diagnosed with parkinsonism and is experiencing decreased ability to ambulate and loss of muscle mass. She was in bed for a time period and daughter thinks it may have something to do with her symptoms. She has bilateral leg weakness. Patient is a poor historian.   Summary  Nerve conduction studies were performed on the bilateral lower extremities:  The bilateral Peroneal(EDB)motor nerves showed normal conductions and F Wave latencies The bilateral Tibial(Abd Belinda Fisher ) motor nerves showed normal conductions and F Wave latencies The bilateral Sural(Sural Anti Sensory ) sensory nerves were within normal limits The bilateral Peroneal(Sup Peron Anti Sensory ) sensory nerves were within normal limits Bilateral H Reflexes showed normal latencies  EMG Needle study was performed on selected  left lower extremity muscles. Technically difficult exam due to patient weakness and decreased mobility.    The Iliopsoas, Vastus Medialis, Anterior Tibialis, Medial Gastrocnemius, Biceps Femoris (long head), Gluteus Maximus and Medius and L5/S1 paraspinals were within normal limits.  Conclusion: This is a normal study. No electrophysiologic evidence for  peripheral polyneuropathy, myopathy or radiculopathy.  Sarina Ill, MD  [facility] [address] [place], [place] [address]  Phone [phone] Fax [phone]

## 2014-11-13 NOTE — Progress Notes (Signed)
  [facility]    Provider:  Dr Jaynee Eagles Referring Provider: Lucille Passy, MD Primary Care Physician:  Arnette Norris, MD  History:  Lori Phillips is a 59 y.o. female here as a referral from Dr. Deborra Medina for lower extremity weakness. Left is worse than the right. She has LBP and radicular symptoms. She is here with daughter. Patient has been diagnosed with parkinsonism and is experiencing decreased ability to ambulate and loss of muscle mass. She was in bed for a time period and daughter thinks it may have something to do with her symptoms. She has bilateral leg weakness. Patient is a poor historian.   Summary  Nerve conduction studies were performed on the bilateral lower extremities:  The bilateral Peroneal(EDB)motor nerves showed normal conductions and F Wave latencies The bilateral Tibial(Abd Belinda Fisher ) motor nerves showed normal conductions and F Wave latencies The bilateral Sural(Sural Anti Sensory ) sensory nerves were within normal limits The bilateral Peroneal(Sup Peron Anti Sensory ) sensory nerves were within normal limits Bilateral H Reflexes showed normal latencies  EMG Needle study was performed on selected  left lower extremity muscles. Technically difficult exam due to patient weakness and decreased mobility.    The Iliopsoas, Vastus Medialis, Anterior Tibialis, Medial Gastrocnemius, Biceps Femoris (long head), Gluteus Maximus and Medius and L5/S1 paraspinals were within normal limits.  Conclusion: This is a normal study. No electrophysiologic evidence for  peripheral polyneuropathy, myopathy or radiculopathy.  Sarina Ill, MD  [facility] [address] [place], [place] [address]  Phone [phone] Fax [phone]

## 2014-11-13 NOTE — Telephone Encounter (Signed)
Pt needs a referral to a movement specialist or to a neurologist. Dr Tat is the movement specialist that was reccommended and [facility] was also reccommended.  This was suggested by the people that did the nerve conduction test.  [phone] Also, dropping off power of attorney

## 2014-11-13 NOTE — Telephone Encounter (Signed)
Referral placed.

## 2014-11-16 DIAGNOSIS — E119 Type 2 diabetes mellitus without complications: Secondary | ICD-10-CM | POA: Diagnosis not present

## 2014-11-16 DIAGNOSIS — F3110 Bipolar disorder, current episode manic without psychotic features, unspecified: Secondary | ICD-10-CM | POA: Diagnosis not present

## 2014-11-16 DIAGNOSIS — I2510 Atherosclerotic heart disease of native coronary artery without angina pectoris: Secondary | ICD-10-CM | POA: Diagnosis not present

## 2014-11-16 DIAGNOSIS — E784 Other hyperlipidemia: Secondary | ICD-10-CM | POA: Diagnosis not present

## 2014-11-28 ENCOUNTER — Telehealth: Payer: Self-pay | Admitting: Neurology

## 2014-11-28 NOTE — Telephone Encounter (Signed)
When patient was here 7/25, Dr Jaynee Eagles suggested she see Dr. Carles Collet but Dr Tat refused to see her because pt had already seen Dr. Melrose Nakayama and Dr Jaynee Eagles. Patient needs to get in with Neurologist, they would like to keep all of her neurology providers in 1 location. Dr Jaynee Eagles said patient would need a more detailed visit if she came back to our office.

## 2014-11-29 ENCOUNTER — Other Ambulatory Visit: Payer: Self-pay | Admitting: Internal Medicine

## 2014-11-29 ENCOUNTER — Ambulatory Visit (INDEPENDENT_AMBULATORY_CARE_PROVIDER_SITE_OTHER): Payer: Medicare Other | Admitting: Internal Medicine

## 2014-11-29 ENCOUNTER — Encounter: Payer: Self-pay | Admitting: Internal Medicine

## 2014-11-29 VITALS — BP 130/82 | HR 64 | Temp 97.7°F | Resp 16 | Ht 64.5 in | Wt 176.0 lb

## 2014-11-29 DIAGNOSIS — E1165 Type 2 diabetes mellitus with hyperglycemia: Secondary | ICD-10-CM

## 2014-11-29 DIAGNOSIS — K3184 Gastroparesis: Secondary | ICD-10-CM | POA: Diagnosis not present

## 2014-11-29 DIAGNOSIS — IMO0002 Reserved for concepts with insufficient information to code with codable children: Secondary | ICD-10-CM

## 2014-11-29 DIAGNOSIS — E039 Hypothyroidism, unspecified: Secondary | ICD-10-CM | POA: Diagnosis not present

## 2014-11-29 DIAGNOSIS — E1143 Type 2 diabetes mellitus with diabetic autonomic (poly)neuropathy: Secondary | ICD-10-CM

## 2014-11-29 LAB — T4, FREE: FREE T4: 0.72 ng/dL (ref 0.60–1.60)

## 2014-11-29 LAB — TSH: TSH: 14.95 u[IU]/mL — ABNORMAL HIGH (ref 0.35–4.50)

## 2014-11-29 LAB — HEMOGLOBIN A1C: HEMOGLOBIN A1C: 7.3 % — AB (ref 4.6–6.5)

## 2014-11-29 MED ORDER — METFORMIN HCL 500 MG PO TABS: 500.0000 mg | ORAL_TABLET | Freq: Two times a day (BID) | ORAL | Status: DC

## 2014-11-29 MED ORDER — LEVOTHYROXINE SODIUM 125 MCG PO TABS: 125.0000 ug | ORAL_TABLET | Freq: Every day | ORAL | Status: DC

## 2014-11-29 NOTE — Telephone Encounter (Signed)
Left detailed message for pt to make her aware that she was referred to our office for EMG/NCS only. She is not actually a pt of Dr. Jaynee Eagles and would need a referral. Was not sure who Dr. Melrose Nakayama was. Told her to call back with any questions. Gave [facility] phone number.

## 2014-11-29 NOTE — Telephone Encounter (Signed)
Daughter [name] is returning your call, call back to [phone]

## 2014-11-29 NOTE — Progress Notes (Signed)
Patient ID: Lori Phillips, female   DOB: Apr 17, 1956, 59 y.o.   MRN: [id number]  HPI: Lori Phillips is a 59 y.o.-year-old female, returning for f/u for DM2, dx 2009, insulin-dependent since dx, uncontrolled, with complications (?CAD, gastroparesis, PN) and hypothyroidism. Last visit 1 year and 4 months ago. She saw Dr. Howell Rucks before she left the practice, with last visit being 2 months ago however, she only saw Dr. Howell Rucks for hypothyroidism. Patient is here with her daughter who offers part of the history.  DM2:  Last hemoglobin A1c was: Lab Results  Component Value Date   HGBA1C 8.7* 07/05/2013   HGBA1C 8.2* 04/26/2012   HGBA1C 8.0* 01/26/2012   Pt is on a regimen of (regimen changed after the last hospitalization): - Levemir 41 units qam - NovoLog SSI; 200-250: 2 units, and increasing by 2 units for every 50 mg/dL She is off Victoza 1.8 mg and Glipizide-Metformin.  Pt used to check sugars 3x day- cannot remember values and she does not bring a log: - am: 100-125 >> 57-146 (75-104) >> 87-106 -  2h after b'fast: 73, 96, 160 >> n/c - lunch: 91-135 >> 100-150? - 2h after lunch: 113, 138 >> n/c - dinner: 83-198 (100-130) >> 202-220 - 2h after dinner: 117-192 >> n/c - bedtime: 108-157 >> 220-230 Lowest sugar was 70s; she has hypoglycemia awareness 61.Highest sugar was 200s   She saw nutrition in the past.   - has mild CKD, last BUN/creatinine:  Lab Results  Component Value Date   BUN 13 10/12/2014   CREATININE 1.00 10/12/2014  On Lisinopril. - last set of lipids: Lab Results  Component Value Date   CHOL 250* 07/05/2013   HDL 46.20 07/05/2013   LDLCALC 80 07/05/2013   LDLDIRECT 47.8 04/13/2013   TRIG 618.0* 07/05/2013   CHOLHDL 5 07/05/2013  On Crestor 20. - last eye exam was in 10/2014 . No DR. Had cataract sx bilat.  - + numbness and tingling in her feet. Last foot exam: by PCP: 07/06/2013.  Hypothyroidism: - Since "~20 years ago" - She is on levothyroxine taken:  - in  am - fasting - with water - separated from b'fast by >30 min - + PPI - along with LT4! - no Ca, Fe, MVI  Last TFTs: Lab Results  Component Value Date   TSH 0.03* 09/26/2014   TSH 0.12* 07/04/2014   TSH 0.16* 08/26/2013   TSH 0.34* 07/05/2013   TSH 0.28* 04/26/2012   FREET4 1.74* 09/26/2014   FREET4 1.35 07/04/2014   FREET4 1.44 08/26/2013   FREET4 0.76 07/05/2013   FREET4 0.74 12/24/2010   LT4 dose was decreased after last check to 137 then 125 mcg daily.  ROS: Constitutional: no weight gain/loss, + fatigue, no subjective hyperthermia/hypothermia, + nocturia Eyes: + blurry vision, no xerophthalmia ENT: no sore throat, no nodules palpated in throat, no dysphagia/odynophagia, no hoarseness Cardiovascular: no CP/SOB/palpitations/+ leg swelling Respiratory: no cough/SOB Gastrointestinal: no N/V/D/C/+ heartburn Musculoskeletal: no muscle/+ joint aches Skin: no rashes Neurological: no tremors/numbness/tingling/dizziness  Past Medical History  Diagnosis Date  . CAD (coronary artery disease)     multiple caths  . Diabetes mellitus   . Hyperlipidemia   . Hypertension   . Thyroid disease     hypothyroid  . Low back pain   . Nephrolithiasis   . Fibromyalgia   . Bipolar disorder   . Anxiety   . Panic attacks   . Enlarged liver   . Rheumatoid arthritis   . Enlarged  pituitary gland   . Fibromyalgia   . Scoliosis   . Osteoporosis   . Osteoarthritis   . Kidney stones   . Hypothyroidism   . Allergy   . Parkinson's disease 2016   Past Surgical History  Procedure Laterality Date  . Vaginal hysterectomy    . Tonsillectomy    . Colonoscopy  multiple    2005 [place] records not available-polyps x 3  . Cardiac catheterization      x3  . Esophageal dilation    . Kidney stone removal     Social History   Social History  . Marital Status: Single    Spouse Name: N/A  . Number of Children: 3  . Years of Education: N/A   Occupational History  . disability    .      Social History Main Topics  . Smoking status: Never Smoker   . Smokeless tobacco: Never Used  . Alcohol Use: Yes     Comment: socially  . Drug Use: No  . Sexual Activity: Not on file   Other Topics Concern  . Not on file   Social History Narrative   Has a living will- desires CPR but no prolonged life support if futile.   Current Outpatient Prescriptions on File Prior to Visit  Medication Sig Dispense Refill  . amitriptyline (ELAVIL) 10 MG tablet Take 1 tablet (10 mg total) by mouth at bedtime as needed for sleep. 30 tablet 0  . amitriptyline (ELAVIL) 10 MG tablet Take 1 tablet (10 mg total) by mouth at bedtime as needed. 30 tablet 0  . ARIPiprazole (ABILIFY) 15 MG tablet Take 1 tablet (15 mg total) by mouth at bedtime. 30 tablet 0  . aspirin EC 81 MG tablet Take 81 mg by mouth daily.    . carbidopa-levodopa (SINEMET IR) 25-100 MG per tablet Take 1 tablet by mouth 3 (three) times daily.    . clonazePAM (KLONOPIN) 0.5 MG tablet Take 1 tablet (0.5 mg total) by mouth daily as needed. 30 tablet 0  . diazepam (VALIUM) 5 MG tablet Take 5 mg by mouth at bedtime.     Marland Kitchen diltiazem (CARDIZEM CD) 180 MG 24 hr capsule TAKE ONE CAPSULE BY MOUTH EVERY DAY 90 capsule 0  . divalproex (DEPAKOTE ER) 500 MG 24 hr tablet TAKE 1 TABLET BY MOUTH TWICE DAILY 60 tablet 0  . DULoxetine (CYMBALTA) 60 MG capsule Take 1 capsule (60 mg total) by mouth daily. 90 capsule 0  . gabapentin (NEURONTIN) 100 MG capsule Take 100-200 mg by mouth 3 (three) times daily. 2-3 times daily if tolerated    . gabapentin (NEURONTIN) 100 MG capsule Limited 1-2 tablets by mouth twice a day to 3 times a day if tolerated 180 capsule 0  . levofloxacin (LEVAQUIN) 250 MG tablet Take 1 tablet (250 mg total) by mouth daily. 5 tablet 0  . lisinopril (PRINIVIL,ZESTRIL) 5 MG tablet Take 1 tablet (5 mg total) by mouth daily. 30 tablet 5  . Oxycodone HCl 10 MG TABS Limited 1 tab by mouth twice a day to 3 times a day if tolerated 90 tablet 0   . pantoprazole (PROTONIX) 40 MG tablet Take 1 tablet (40 mg total) by mouth daily. 90 tablet 0  . rosuvastatin (CRESTOR) 20 MG tablet Take 1 tablet (20 mg total) by mouth daily. 30 tablet 0  . vitamin B-12 (CYANOCOBALAMIN) 500 MCG tablet Take 500 mcg by mouth daily.    . vitamin B-12 500 MCG tablet Take  1 tablet (500 mcg total) by mouth daily. 30 tablet 0  . [DISCONTINUED] TAZTIA XT 180 MG 24 hr capsule TAKE ONE CAPSULE BY MOUTH EVERY DAY 90 capsule 0   No current facility-administered medications on file prior to visit.  + Levemir 41 units in a.m. + NovoLog sliding scale   Allergies  Allergen Reactions  . Byetta 10 Mcg Pen [Exenatide] Nausea And Vomiting    Other reaction(s): Dizziness and giddiness (finding) incoherent  . Glyburide     Other reaction(s): Dizziness and giddiness (finding), Other (qualifier value) confusion  . Quetiapine   . Seroquel  [Quetiapine Fumarate]     Other reaction(s): Diarrhea and vomiting (finding)   Family History  Problem Relation Age of Onset  . Hypertension Mother   . Diabetes Mother   . Bipolar disorder Father   . Colon cancer Paternal Aunt   . Colon cancer Paternal Grandmother   . Thyroid disease Neg Hx   . Hypertension Father    PE: BP 130/82 mmHg  Pulse 64  Temp(Src) 97.7 F (36.5 C) (Oral)  Resp 16  Ht 5' 4.5" (1.638 m)  Wt 176 lb (79.833 kg)  BMI 29.75 kg/m2  SpO2 97% Wt Readings from Last 3 Encounters:  11/29/14 176 lb (79.833 kg)  10/14/14 174 lb 3.2 oz (79.017 kg)  09/28/14 172 lb (78.019 kg)   Constitutional: overweight, in NAD, in wheelchair Eyes: PERRLA, EOMI, no exophthalmos ENT: moist mucous membranes, no thyromegaly, no cervical lymphadenopathy Cardiovascular: RRR, No MRG Respiratory: CTA B Gastrointestinal: abdomen soft, NT, ND, BS+ Musculoskeletal: no deformities, strength intact in all 4 Skin: moist, warm, no rashes Neurological: + tremor with outstretched hands, DTR normal in all 4  ASSESSMENT: 1. DM2,  insulin-dependent, uncontrolled, with complications - ?CAD - had CP in the past >> has 2 catetherizations: 2000 and 2001 - attributed CP to anxiety - PN - gastroparesis  2. Hypothyroidism  PLAN:  1. Patient with long-standing, uncontrolled diabetes, on basal insulin now, with few units of rapid-acting insulin based on sliding scale - diabetic treatment changed after her last hospitalization. As she is not using mealtime insulin, her sugars increased throughout the day in a staircase fashion. This is usually a sign of not enough mealtime coverage therefore, we will add mealtime NovoLog doses and will decrease the basal insulin dose to avoid hypoglycemia. I believe that the Levemir dose is too high for her, since she drops her sugars too much overnight and also has instances with hypoglycemia at night. I will also try to add back metformin, at half maximal dose. At next visit, we may be able to increase metformin, or even switch back from NovoLog to on an insulin medication (a GLP-1 receptor agonist or a sulfonylurea). - I suggested to:  Patient Instructions  Please stop at the lab.  Please decrease Levemir in am to 30 units.  Please add NovoLog: - 5 units with a small meal - 7 units with a large meal  Start Metformin 500 mg 2x a day, with meals.  Please return in 1.5 month with your sugar log.   - continue checking sugars at different times of the day - check 2-3 times a day, rotating checks. She is doing a great job with this per review of her log. - She is up-to-date with eye exams - Return to clinic in 1.5 mo with sugar log   2. Hypothyroidism - I have not seen patient for this condition in the past - She has uncontrolled hypothyroidism,  on levothyroxine, with decreasing dosage as her TSH remains suppressed - She is not taking her levothyroxine correctly, not separating her PPI from the thyroid hormone in a.m.  - We discussed to take the thyroid hormone every day, with water, >30  minutes before breakfast, separated by >4 hours from acid reflux medications, calcium, iron, multivitamins. She will move Protonix at lunchtime. - We'll check her TFTs today, and may need to decrease her levothyroxine even further from 125 g daily, depending on the labs, especially since we are separating this from Protonix.  - We'll repeat her thyroid tests when she returns in 1.5 mo  Patient Instructions  Please stop at the lab.  Take the thyroid hormone every day, with water, >30 minutes before breakfast, separated by >4 hours from acid reflux medications, calcium, iron, multivitamins.  I will let you know if we need to change the Levothyroxine dose when labs are back  Contact: daughter, Friday or after.  - time spent with the patient: 40 minutes, of which >50% was spent in obtaining information about her diabetes and hypothyroidism, reviewing her previous labs, evaluations, and treatments, counseling her about her condition (please see the discussed topics above), and developing a plan to further investigate and treat them; she and her daughter had a number of questions which I addressed.  Office Visit on 11/29/2014  Component Date Value Ref Range Status  . Hgb A1c MFr Bld 11/29/2014 7.3* 4.6 - 6.5 % Final   Glycemic Control Guidelines for People with Diabetes:Non Diabetic:  <6%Goal of Therapy: <7%Additional Action Suggested:  >8%   . TSH 11/29/2014 14.95* 0.35 - 4.50 uIU/mL Final  . Free T4 11/29/2014 0.72  0.60 - 1.60 ng/dL Final   Hemoglobin A1c is much improved. See plan above. I believe we can taper off mealtime insulin at next visit. TSH is very high, consistent with taking levothyroxine along with Protonix. Will continue the same dose of LT4, 125 g daily, but separated from the acid reflux medicine as mentioned above.

## 2014-11-29 NOTE — Telephone Encounter (Signed)
Relayed to patient's daughter that a referral is required. She understood and will be getting it from PCP.

## 2014-11-29 NOTE — Patient Instructions (Addendum)
Please stop at the lab.  Please decrease Levemir in am to 30 units.  Please add NovoLog: - 5 units with a small meal - 7 units with a large meal  Start Metformin 500 mg 2x a day, with meals.  Please return in 1.5 month with your sugar log.   Take the thyroid hormone every day, with water, >30 minutes before breakfast, separated by >4 hours from acid reflux medications, calcium, iron, multivitamins.  I will let you know if we need to change the Levothyroxine dose when labs are back

## 2014-11-29 NOTE — Telephone Encounter (Signed)
Phone was busy. Will other number.

## 2014-11-30 DIAGNOSIS — E119 Type 2 diabetes mellitus without complications: Secondary | ICD-10-CM | POA: Diagnosis not present

## 2014-11-30 DIAGNOSIS — E039 Hypothyroidism, unspecified: Secondary | ICD-10-CM | POA: Diagnosis not present

## 2014-11-30 DIAGNOSIS — I10 Essential (primary) hypertension: Secondary | ICD-10-CM | POA: Diagnosis not present

## 2014-11-30 DIAGNOSIS — F319 Bipolar disorder, unspecified: Secondary | ICD-10-CM | POA: Diagnosis not present

## 2014-12-05 DIAGNOSIS — F3160 Bipolar disorder, current episode mixed, unspecified: Secondary | ICD-10-CM | POA: Diagnosis not present

## 2014-12-05 DIAGNOSIS — I2510 Atherosclerotic heart disease of native coronary artery without angina pectoris: Secondary | ICD-10-CM | POA: Diagnosis not present

## 2014-12-05 DIAGNOSIS — E1121 Type 2 diabetes mellitus with diabetic nephropathy: Secondary | ICD-10-CM | POA: Diagnosis not present

## 2014-12-05 DIAGNOSIS — G20 Parkinson's disease: Secondary | ICD-10-CM | POA: Diagnosis not present

## 2014-12-05 DIAGNOSIS — I10 Essential (primary) hypertension: Secondary | ICD-10-CM | POA: Diagnosis not present

## 2014-12-05 DIAGNOSIS — M0569 Rheumatoid arthritis of multiple sites with involvement of other organs and systems: Secondary | ICD-10-CM | POA: Diagnosis not present

## 2014-12-11 ENCOUNTER — Ambulatory Visit: Payer: Medicare Other | Admitting: Family Medicine

## 2014-12-12 DIAGNOSIS — I2510 Atherosclerotic heart disease of native coronary artery without angina pectoris: Secondary | ICD-10-CM | POA: Diagnosis not present

## 2014-12-12 DIAGNOSIS — G20 Parkinson's disease: Secondary | ICD-10-CM | POA: Diagnosis not present

## 2014-12-12 DIAGNOSIS — I10 Essential (primary) hypertension: Secondary | ICD-10-CM | POA: Diagnosis not present

## 2014-12-12 DIAGNOSIS — F3160 Bipolar disorder, current episode mixed, unspecified: Secondary | ICD-10-CM | POA: Diagnosis not present

## 2014-12-12 DIAGNOSIS — E1121 Type 2 diabetes mellitus with diabetic nephropathy: Secondary | ICD-10-CM | POA: Diagnosis not present

## 2014-12-12 DIAGNOSIS — M0569 Rheumatoid arthritis of multiple sites with involvement of other organs and systems: Secondary | ICD-10-CM | POA: Diagnosis not present

## 2014-12-13 DIAGNOSIS — E1121 Type 2 diabetes mellitus with diabetic nephropathy: Secondary | ICD-10-CM | POA: Diagnosis not present

## 2014-12-13 DIAGNOSIS — I2510 Atherosclerotic heart disease of native coronary artery without angina pectoris: Secondary | ICD-10-CM | POA: Diagnosis not present

## 2014-12-13 DIAGNOSIS — I10 Essential (primary) hypertension: Secondary | ICD-10-CM | POA: Diagnosis not present

## 2014-12-13 DIAGNOSIS — M0569 Rheumatoid arthritis of multiple sites with involvement of other organs and systems: Secondary | ICD-10-CM | POA: Diagnosis not present

## 2014-12-13 DIAGNOSIS — F3160 Bipolar disorder, current episode mixed, unspecified: Secondary | ICD-10-CM | POA: Diagnosis not present

## 2014-12-13 DIAGNOSIS — G20 Parkinson's disease: Secondary | ICD-10-CM | POA: Diagnosis not present

## 2014-12-14 ENCOUNTER — Telehealth: Payer: Self-pay | Admitting: Internal Medicine

## 2014-12-14 ENCOUNTER — Encounter: Payer: Self-pay | Admitting: Family Medicine

## 2014-12-14 ENCOUNTER — Ambulatory Visit (INDEPENDENT_AMBULATORY_CARE_PROVIDER_SITE_OTHER): Payer: Medicare Other | Admitting: Family Medicine

## 2014-12-14 ENCOUNTER — Telehealth: Payer: Self-pay

## 2014-12-14 VITALS — BP 102/62 | HR 52 | Temp 97.9°F | Wt 168.5 lb

## 2014-12-14 DIAGNOSIS — R531 Weakness: Secondary | ICD-10-CM

## 2014-12-14 DIAGNOSIS — G20 Parkinson's disease: Secondary | ICD-10-CM | POA: Diagnosis not present

## 2014-12-14 DIAGNOSIS — IMO0002 Reserved for concepts with insufficient information to code with codable children: Secondary | ICD-10-CM

## 2014-12-14 DIAGNOSIS — K3184 Gastroparesis: Secondary | ICD-10-CM

## 2014-12-14 DIAGNOSIS — E1143 Type 2 diabetes mellitus with diabetic autonomic (poly)neuropathy: Secondary | ICD-10-CM

## 2014-12-14 DIAGNOSIS — N390 Urinary tract infection, site not specified: Secondary | ICD-10-CM | POA: Diagnosis not present

## 2014-12-14 DIAGNOSIS — E1165 Type 2 diabetes mellitus with hyperglycemia: Secondary | ICD-10-CM

## 2014-12-14 DIAGNOSIS — G20C Parkinsonism, unspecified: Secondary | ICD-10-CM | POA: Insufficient documentation

## 2014-12-14 LAB — POCT URINALYSIS DIPSTICK
BILIRUBIN UA: NEGATIVE
Blood, UA: NEGATIVE
GLUCOSE UA: NEGATIVE
KETONES UA: POSITIVE
Nitrite, UA: NEGATIVE
Urobilinogen, UA: 0.2
pH, UA: 6

## 2014-12-14 MED ORDER — INSULIN LISPRO 100 UNIT/ML ~~LOC~~ SOLN: SUBCUTANEOUS | Status: DC

## 2014-12-14 NOTE — Assessment & Plan Note (Signed)
FSBS improved with current meds. Follow up with Dr. Cruzita Lederer as scheduled. The patient indicates understanding of these issues and agrees with the plan.

## 2014-12-14 NOTE — Assessment & Plan Note (Signed)
Improving. Multifactorial- narcotics overuse, parkinsonism, depression. Continue home PT/OT and ST. The patient indicates understanding of these issues and agrees with the plan.

## 2014-12-14 NOTE — Assessment & Plan Note (Signed)
Resolved. UA reassuring- pos for LE, unlikely infection but will send for cx to be sure. No rx added today. The patient indicates understanding of these issues and agrees with the plan.

## 2014-12-14 NOTE — Telephone Encounter (Signed)
Pt left v/m; pt seen earlier today by Dr Deborra Medina and pt wanted Dr Deborra Medina to know that pt spoke with Dr Cruzita Lederer and novolog was changed to humalog. This is FYI for Dr Deborra Medina.

## 2014-12-14 NOTE — Telephone Encounter (Signed)
Called pt and she said her ins will no longer cover Novolog. Can we switch to Humalog? Please advise.

## 2014-12-14 NOTE — Progress Notes (Signed)
Subjective:   Patient ID: Lori Phillips, female    DOB: February 25, 1956, 59 y.o.   MRN: [id number]  Lori Phillips is a pleasant 59 y.o. year old female who presents to clinic today with Follow-up  on 12/14/2014  HPI:  Admitted to [facility] 6/23-6/25/2016 for UTI and generalized weakness. Notes reviewed.  Urine cx grew e coli- diagnosed with E coli UTI and discharged to SNF with 7 days total of abx- on levaquin.  Felt weakness was likely multifactorial and sent to SNF- [facility]- for PT/OT rehab.  Sinemet has helped with her tremor and movement issues.  DM- saw Dr. Cruzita Lederer on 11/29/14- note reviewed. Added Novolog, decreased Levemir to 30 units every morning and added Metformin 500 mg twice daily. Checking FSBS twice daily - running in low to mid 100s.  Denies any episodes of hypoglycemia.  Advised follow up in 1.5 months with Dr. Cruzita Lederer.  Has lost weight but appetite improving. Has also cut back on oxycyodone  No dysuria but did not have any dysuria prior to her hospital admission either.  Lab Results  Component Value Date   HGBA1C 7.3* 11/29/2014   Lab Results  Component Value Date   NA 139 10/12/2014   K 4.3 10/12/2014   CL 99* 10/12/2014   CO2 29 10/12/2014   Lab Results  Component Value Date   ALT <5* 10/12/2014   AST 17 10/12/2014   ALKPHOS 62 10/12/2014   BILITOT 0.4 10/12/2014   Current Outpatient Prescriptions on File Prior to Visit  Medication Sig Dispense Refill  . amitriptyline (ELAVIL) 10 MG tablet Take 1 tablet (10 mg total) by mouth at bedtime as needed. 30 tablet 0  . ARIPiprazole (ABILIFY) 15 MG tablet Take 1 tablet (15 mg total) by mouth at bedtime. 30 tablet 0  . carbidopa-levodopa (SINEMET IR) 25-100 MG per tablet Take 1 tablet by mouth 3 (three) times daily.    . clonazePAM (KLONOPIN) 0.5 MG tablet Take 1 tablet (0.5 mg total) by mouth daily as needed. 30 tablet 0  . diazepam (VALIUM) 5 MG tablet Take 2.5 mg by mouth at bedtime. Take 1/2 tab at bedtime      . diltiazem (CARDIZEM CD) 180 MG 24 hr capsule TAKE ONE CAPSULE BY MOUTH EVERY DAY 90 capsule 0  . divalproex (DEPAKOTE ER) 500 MG 24 hr tablet TAKE 1 TABLET BY MOUTH TWICE DAILY 60 tablet 0  . gabapentin (NEURONTIN) 100 MG capsule Take 300 mg by mouth. Take 100mg  every am and 200mg  every pm    . gabapentin (NEURONTIN) 100 MG capsule Limited 1-2 tablets by mouth twice a day to 3 times a day if tolerated 180 capsule 0  . insulin aspart (NOVOLOG) 100 UNIT/ML injection Inject 5-7 Units into the skin 3 (three) times daily before meals. Inject 5units subQ every am and 7units subQ every pm    . insulin detemir (LEVEMIR) 100 UNIT/ML injection Inject 30 Units into the skin at bedtime.    Marland Kitchen levothyroxine (SYNTHROID, LEVOTHROID) 125 MCG tablet Take 1 tablet (125 mcg total) by mouth daily. 60 tablet 1  . lisinopril (PRINIVIL,ZESTRIL) 5 MG tablet Take 1 tablet (5 mg total) by mouth daily. 30 tablet 5  . metFORMIN (GLUCOPHAGE) 500 MG tablet TAKE 1 TABLET(500 MG) BY MOUTH TWICE DAILY WITH A MEAL 180 tablet 1  . Oxycodone HCl 10 MG TABS Limited 1 tab by mouth twice a day to 3 times a day if tolerated 90 tablet 0  . pantoprazole (PROTONIX) 40  MG tablet Take 1 tablet (40 mg total) by mouth daily. 90 tablet 0  . rosuvastatin (CRESTOR) 20 MG tablet Take 1 tablet (20 mg total) by mouth daily. 30 tablet 0  . vitamin B-12 (CYANOCOBALAMIN) 500 MCG tablet Take 500 mcg by mouth daily.    . [DISCONTINUED] TAZTIA XT 180 MG 24 hr capsule TAKE ONE CAPSULE BY MOUTH EVERY DAY 90 capsule 0   No current facility-administered medications on file prior to visit.    Allergies  Allergen Reactions  . Byetta 10 Mcg Pen [Exenatide] Nausea And Vomiting    Other reaction(s): Dizziness and giddiness (finding) incoherent  . Glyburide     Other reaction(s): Dizziness and giddiness (finding), Other (qualifier value) confusion  . Quetiapine   . Seroquel  [Quetiapine Fumarate]     Other reaction(s): Diarrhea and vomiting (finding)     Past Medical History  Diagnosis Date  . CAD (coronary artery disease)     multiple caths  . Diabetes mellitus   . Hyperlipidemia   . Hypertension   . Thyroid disease     hypothyroid  . Low back pain   . Nephrolithiasis   . Fibromyalgia   . Bipolar disorder   . Anxiety   . Panic attacks   . Enlarged liver   . Rheumatoid arthritis   . Enlarged pituitary gland   . Fibromyalgia   . Scoliosis   . Osteoporosis   . Osteoarthritis   . Kidney stones   . Hypothyroidism   . Allergy   . Parkinson's disease 2016    Past Surgical History  Procedure Laterality Date  . Vaginal hysterectomy    . Tonsillectomy    . Colonoscopy  multiple    2005 [place] records not available-polyps x 3  . Cardiac catheterization      x3  . Esophageal dilation    . Kidney stone removal      Family History  Problem Relation Age of Onset  . Hypertension Mother   . Diabetes Mother   . Bipolar disorder Father   . Colon cancer Paternal Aunt   . Colon cancer Paternal Grandmother   . Thyroid disease Neg Hx   . Hypertension Father     Social History   Social History  . Marital Status: Single    Spouse Name: N/A  . Number of Children: 3  . Years of Education: N/A   Occupational History  . disability    .     Social History Main Topics  . Smoking status: Never Smoker   . Smokeless tobacco: Never Used  . Alcohol Use: Yes     Comment: socially  . Drug Use: No  . Sexual Activity: Not on file   Other Topics Concern  . Not on file   Social History Narrative   Has a living will- desires CPR but no prolonged life support if futile.   The PMH, PSH, Social History, Family History, Medications, and allergies have been reviewed in [facility], and have been updated if relevant.   Review of Systems  Constitutional: Negative.   HENT: Negative.   Respiratory: Negative.   Cardiovascular: Negative.   Gastrointestinal: Negative.   Endocrine: Negative.   Genitourinary: Negative.    Musculoskeletal: Negative.   Skin: Negative.   Allergic/Immunologic: Negative.   Neurological: Negative.   Hematological: Negative.   Psychiatric/Behavioral: Negative.   All other systems reviewed and are negative.      Objective:    BP 102/62 mmHg  Pulse 52  Temp(Src) 97.9 F (36.6 C) (Oral)  Wt 168 lb 8 oz (76.431 kg)  SpO2 99%  Wt Readings from Last 3 Encounters:  12/14/14 168 lb 8 oz (76.431 kg)  11/29/14 176 lb (79.833 kg)  10/14/14 174 lb 3.2 oz (79.017 kg)    Physical Exam  Constitutional: She is oriented to person, place, and time. She appears well-developed.  HENT:  Head: Normocephalic.  Eyes: Pupils are equal, round, and reactive to light.  Cardiovascular: Normal rate.   Pulmonary/Chest: Effort normal and breath sounds normal.  Musculoskeletal:  Walking with walker  Neurological: She is alert and oriented to person, place, and time. No cranial nerve deficit.  No tremor today  Skin: Skin is warm and dry.  Psychiatric: She has a normal mood and affect. Her behavior is normal. Judgment and thought content normal.          Assessment & Plan:   Urinary tract infection without hematuria, site unspecified  Type 2 diabetes, uncontrolled, with gastroparesis  Generalized weakness No Follow-up on file.

## 2014-12-14 NOTE — Telephone Encounter (Signed)
Yes, OK 

## 2014-12-14 NOTE — Telephone Encounter (Signed)
Please call pt about new medicine instead of novalog due to insurance [phone]

## 2014-12-14 NOTE — Progress Notes (Signed)
Pre visit review using our clinic review tool, if applicable. No additional management support is needed unless otherwise documented below in the visit note. 

## 2014-12-15 ENCOUNTER — Ambulatory Visit (INDEPENDENT_AMBULATORY_CARE_PROVIDER_SITE_OTHER): Payer: Medicare Other | Admitting: Neurology

## 2014-12-15 ENCOUNTER — Encounter: Payer: Self-pay | Admitting: Neurology

## 2014-12-15 VITALS — BP 128/64 | HR 68 | Ht 64.5 in | Wt 168.8 lb

## 2014-12-15 DIAGNOSIS — G20 Parkinson's disease: Secondary | ICD-10-CM | POA: Diagnosis not present

## 2014-12-15 LAB — URINE CULTURE

## 2014-12-15 NOTE — Progress Notes (Signed)
[facility]    Provider:  Dr Jaynee Eagles Referring Provider: Lucille Passy, MD Primary Care Physician:  Arnette Norris, MD  CC:  Parkinsonism  HPI:  Lori Phillips is a 59 y.o. female here as a referral from Dr. Deborra Medina for Parkinsonism. She has been evaluated by multiple neurologists and apparently at [facility] and been diagnosed with Parkinsonism. She also has a PMHx of UTI, generalized weakness, DM, chronic pain utilizing pain medication, HTN, CAD, anxiety, bipolar disorder, polysubstabce abuse, a suicide attempt, Fibromyalgia and reported 66-month period when patient was bed bound that daughter thinks may have contributed to symptoms due to disuse. She is Bipolar and is on dopamine-blocking agent and daughter thinks that is may be possible that her parkinsonism started about the same time as the Abilify.    She feels she has seen improvement recently. She is moving better. She has had multiple medication changes and she feels like she is more mobile. Neurontin and Depakote were managed. Her medications were decreased. Her hgba1c is better controlled. She is participating in physical therapy. She has memory loss. She has been diagnosed with parkinsonism. She was in bed for about 8 months starting last summer. She started going to see a doctor about back pain, it helped but after that it didn't and she was in bed because of pain. She started getting out of bed when she went to the rehab center in June. She started getting physical therapy which helped. She has home health care. She is getting occupational therapy and speech therapy. She still feels weak but she is getting better. She is more alert than what she had been as well. Continuously improving. Her tremor is better since changing the Depakote and starting the sinemet. Her sinemet has been increased. Volume of voice is stable. Some drooling on the left side of the face. The shuffling gait is improving. She can take bigger steps when she is  told. Smell and taste is good. She is on Abilify. Tried Seroquel in the past. She has depression, memory changes. No hallucinations. She doesn't feel the Sinemet kick in. The tremors were in both hands. Takes the Sinemet 3x a day and feels the medication wearing off. She knows it is time and needs her sinemet. Tremor is also in the hands and legs. When she tries to take a step her legs tremble.Symptoms started around the time she started taking the Abilify.   Reviewed notes, labs and imaging from outside physicians, which showed:  CT of the head showed non-specific white matter changes and atrophy which I think is more advanced than stated age. Personally reviewed.  Hemoglobin A1c 7.3, TSH elevated with normal free T4, CMP unremarkable, B12 was 979 but was 292 a year ago  Review of Systems: Patient complains of symptoms per HPI as well as the following symptoms: Fatigue, shortness of breath, swelling in legs, trouble swallowing, incontinence, diarrhea, joint pain, incontinence, memory loss, weakness, slurred speech, swallowing, snoring, tremor, depression, anxiety, decreased energy, disinterest in activities, racing thoughts . Pertinent negatives per HPI. All others negative.   Social History   Social History  . Marital Status: Single    Spouse Name: N/A  . Number of Children: 3  . Years of Education: 12   Occupational History  . disability  Other   Social History Main Topics  . Smoking status: Never Smoker   . Smokeless tobacco: Never Used  . Alcohol Use: Yes     Comment: socially  . Drug Use:  No  . Sexual Activity: Not on file   Other Topics Concern  . Not on file   Social History Narrative   Has a living will- desires CPR but no prolonged life support if futile.   Caffeine use: Drinks 2 L soda per day   Drinks 2 glasses tea/day    Family History  Problem Relation Age of Onset  . Hypertension Mother   . Diabetes Mother   . Bipolar disorder Father   . Colon cancer  Paternal Aunt   . Colon cancer Paternal Grandmother   . Thyroid disease Neg Hx   . Hypertension Father     Past Medical History  Diagnosis Date  . CAD (coronary artery disease)     multiple caths  . Diabetes mellitus   . Hyperlipidemia   . Hypertension   . Thyroid disease     hypothyroid  . Low back pain   . Nephrolithiasis   . Fibromyalgia   . Bipolar disorder   . Anxiety   . Panic attacks   . Enlarged liver   . Rheumatoid arthritis   . Enlarged pituitary gland   . Fibromyalgia   . Scoliosis   . Osteoporosis   . Osteoarthritis   . Kidney stones   . Hypothyroidism   . Allergy   . Parkinson's disease 2016    Past Surgical History  Procedure Laterality Date  . Vaginal hysterectomy    . Tonsillectomy    . Colonoscopy  multiple    2005 [place] records not available-polyps x 3  . Cardiac catheterization      x3  . Esophageal dilation    . Kidney stone removal      Current Outpatient Prescriptions  Medication Sig Dispense Refill  . acetaminophen (TYLENOL) 650 MG CR tablet Take 650 mg by mouth 3 (three) times daily as needed for pain.    Marland Kitchen amitriptyline (ELAVIL) 10 MG tablet Take 1 tablet (10 mg total) by mouth at bedtime as needed. 30 tablet 0  . ARIPiprazole (ABILIFY) 15 MG tablet Take 1 tablet (15 mg total) by mouth at bedtime. 30 tablet 0  . carbidopa-levodopa (SINEMET IR) 25-100 MG per tablet Take 1 tablet by mouth 3 (three) times daily.    . clonazePAM (KLONOPIN) 0.5 MG tablet Take 1 tablet (0.5 mg total) by mouth daily as needed. 30 tablet 0  . diazepam (VALIUM) 5 MG tablet Take 2.5 mg by mouth at bedtime. Take 1/2 tab at bedtime    . diltiazem (CARDIZEM CD) 180 MG 24 hr capsule TAKE ONE CAPSULE BY MOUTH EVERY DAY 90 capsule 0  . divalproex (DEPAKOTE ER) 500 MG 24 hr tablet TAKE 1 TABLET BY MOUTH TWICE DAILY 60 tablet 0  . gabapentin (NEURONTIN) 100 MG capsule Take 300 mg by mouth. Take 100mg  every am and 200mg  every pm    . insulin aspart (NOVOLOG) 100  UNIT/ML injection Inject 5-7 Units into the skin 3 (three) times daily before meals. Inject 5units subQ every am and 7units subQ every pm    . insulin detemir (LEVEMIR) 100 UNIT/ML injection Inject 30 Units into the skin at bedtime.    Marland Kitchen levothyroxine (SYNTHROID, LEVOTHROID) 125 MCG tablet Take 1 tablet (125 mcg total) by mouth daily. 60 tablet 1  . lisinopril (PRINIVIL,ZESTRIL) 5 MG tablet Take 1 tablet (5 mg total) by mouth daily. 30 tablet 5  . metFORMIN (GLUCOPHAGE) 500 MG tablet TAKE 1 TABLET(500 MG) BY MOUTH TWICE DAILY WITH A MEAL 180 tablet 1  .  Oxycodone HCl 10 MG TABS Limited 1 tab by mouth twice a day to 3 times a day if tolerated 90 tablet 0  . pantoprazole (PROTONIX) 40 MG tablet Take 1 tablet (40 mg total) by mouth daily. 90 tablet 0  . rosuvastatin (CRESTOR) 20 MG tablet Take 1 tablet (20 mg total) by mouth daily. 30 tablet 0  . vitamin B-12 (CYANOCOBALAMIN) 500 MCG tablet Take 500 mcg by mouth daily.    . insulin lispro (HUMALOG) 100 UNIT/ML injection Inject 5-7 Units into the skin 3 (three) times daily before meals. (Patient not taking: Reported on 12/15/2014) 10 mL 1  . Nystatin (NYAMYC EX) Apply 100,000 Units topically 2 (two) times daily.    . [DISCONTINUED] TAZTIA XT 180 MG 24 hr capsule TAKE ONE CAPSULE BY MOUTH EVERY DAY 90 capsule 0   No current facility-administered medications for this visit.    Allergies as of 12/15/2014 - Review Complete 12/15/2014  Allergen Reaction Noted  . Byetta 10 mcg pen [exenatide] Nausea And Vomiting 10/03/2011  . Glyburide  08/15/2014  . Quetiapine  02/21/2010  . Seroquel  [quetiapine fumarate]  08/15/2014    Vitals: BP 128/64 mmHg  Pulse 68  Ht 5' 4.5" (1.638 m)  Wt 168 lb 12.8 oz (76.567 kg)  BMI 28.54 kg/m2 Last Weight:  Wt Readings from Last 1 Encounters:  12/15/14 168 lb 12.8 oz (76.567 kg)   Last Height:   Ht Readings from Last 1 Encounters:  12/15/14 5' 4.5" (1.638 m)   Physical exam: Exam: Gen: NAD, psychomotor  slowing.                CV: RRR, no MRG. No Carotid Bruits. No peripheral edema, warm, nontender Eyes: Conjunctivae clear without exudates or hemorrhage  Neuro: Detailed Neurologic Exam  Speech:  No dysarthria or aphasia. Cognition: MoCA 20/30 (-2 visuospatial execution, -1 language, -2 abstraction, -5 delayed recall)    The patient is oriented to person, place, and time;     recent memory impaired and remote memory grossly intact    language fluent;     Impaired attention, concentration,     fund of knowledge impaired Cranial Nerves:    The pupils are equal, round, and reactive to light. The fundi are flat. Visual fields are full to finger confrontation. Extraocular movements are intact. Trigeminal sensation is intact and the muscles of mastication are normal. The face is symmetric. The palate elevates in the midline. Hearing intact. Voice is normal. Shoulder shrug is normal. The tongue has normal motion without fasciculations.   Coordination:    Slowed but not dysmetric. Some mild bradykinesia on finger tapping bilaterally  Gait:    Has to use a walker. multi-step turn. Short strides.  Motor Observation: Mild postural tremor. Last took sinemet at 3 hours ago. No resting tremor.      Tone:    Some very mild cogwheeling of the right arm with facilitation  Posture:    Stooped    Strength: Mild hip flexion weakness otherwise strength is V/V in the upper and lower limbs.      Sensation: intact to LT     Reflex Exam:  DTR's:    Deep tendon reflexes are brisk in the uppers and 1+ lower extremities  Toes:    The toes are downgoing bilaterally.   Clonus:    Clonus is absent.       Assessment/Plan:   Lori Phillips is a 60 y.o. female here as a referral from Dr.  Deborra Medina for Parkinsonism. She has been evaluated by multiple neurologists and apparently at [facility] and been diagnosed with Parkinsonism. She also has a PMHx of UTI, generalized weakness, DM, chronic pain utilizing pain  medication, HTN, CAD, anxiety, bipolar disorder  and reported 56-month period when patient was bed bound that daughter thinks may have contributed to symptoms due to disuse. She is Bipolar and is on dopamine-blocking agent and daughter thinks that is may be possible that her parkinsonism started about the same time as the Abilify, unsure.   I suspect the etiology of her symptoms is multifactorial. She does have parkinsonian symptoms.  There are multiple causes of parkinsonism including dopamine-blocking agents such as the Abilify she is taking, parkinson's plus disorders (psp, msa and others),  lewy-body dementia or other neurodegenerative disorders. I did recommend following up with her psychiatrist to see if the Abilify can be changed but it appears she has a long history of mood disorder and has tried multiple medications already.    She has been evaluated at [facility] and at [facility], need records before proceeding with any further workup except need current cmp. Can continue current medications.   [place] [phone] Sept 20th [facility]     Sarina Ill, MD  [facility] [address] [place],  [address]  Phone [phone] Fax [phone]

## 2014-12-15 NOTE — Patient Instructions (Signed)
Overall you are doing fairly well but I do want to suggest a few things today:   Remember to drink plenty of fluid, eat healthy meals and do not skip any meals. Try to eat protein with a every meal and eat a healthy snack such as fruit or nuts in between meals. Try to keep a regular sleep-wake schedule and try to exercise daily, particularly in the form of walking, 20-30 minutes a day, if you can.   As far as diagnostic testing: Will review records  I would like to see you back after I get records, sooner if we need to. Please call us with any interim questions, concerns, problems, updates or refill requests.   Our phone number is [phone]. We also have an after hours call service for urgent matters and there is a physician on-call for urgent questions. For any emergencies you know to call 911 or go to the nearest emergency room

## 2014-12-18 ENCOUNTER — Telehealth: Payer: Self-pay | Admitting: *Deleted

## 2014-12-18 ENCOUNTER — Telehealth: Payer: Self-pay | Admitting: Family Medicine

## 2014-12-18 DIAGNOSIS — I2510 Atherosclerotic heart disease of native coronary artery without angina pectoris: Secondary | ICD-10-CM | POA: Diagnosis not present

## 2014-12-18 DIAGNOSIS — I10 Essential (primary) hypertension: Secondary | ICD-10-CM | POA: Diagnosis not present

## 2014-12-18 DIAGNOSIS — M0569 Rheumatoid arthritis of multiple sites with involvement of other organs and systems: Secondary | ICD-10-CM | POA: Diagnosis not present

## 2014-12-18 DIAGNOSIS — F3160 Bipolar disorder, current episode mixed, unspecified: Secondary | ICD-10-CM | POA: Diagnosis not present

## 2014-12-18 DIAGNOSIS — E1121 Type 2 diabetes mellitus with diabetic nephropathy: Secondary | ICD-10-CM | POA: Diagnosis not present

## 2014-12-18 DIAGNOSIS — G20 Parkinson's disease: Secondary | ICD-10-CM | POA: Diagnosis not present

## 2014-12-18 NOTE — Telephone Encounter (Signed)
Release fax to [facility] requesting medical records.

## 2014-12-18 NOTE — Telephone Encounter (Signed)
Ok to give verbal orders as requested. 

## 2014-12-18 NOTE — Telephone Encounter (Signed)
Spoke to Santiago Glad and provided verbal orders as requested

## 2014-12-18 NOTE — Telephone Encounter (Signed)
Release faxed to Dr. Annamaria Helling requesting records.

## 2014-12-18 NOTE — Telephone Encounter (Signed)
amedysis Santiago Glad ) called to get verbal orders Speech therapy twice a week for 3 weeks cb number is [phone]

## 2014-12-19 DIAGNOSIS — I2510 Atherosclerotic heart disease of native coronary artery without angina pectoris: Secondary | ICD-10-CM | POA: Diagnosis not present

## 2014-12-19 DIAGNOSIS — G20 Parkinson's disease: Secondary | ICD-10-CM | POA: Diagnosis not present

## 2014-12-19 DIAGNOSIS — F3160 Bipolar disorder, current episode mixed, unspecified: Secondary | ICD-10-CM | POA: Diagnosis not present

## 2014-12-19 DIAGNOSIS — E1121 Type 2 diabetes mellitus with diabetic nephropathy: Secondary | ICD-10-CM | POA: Diagnosis not present

## 2014-12-19 DIAGNOSIS — I10 Essential (primary) hypertension: Secondary | ICD-10-CM | POA: Diagnosis not present

## 2014-12-19 DIAGNOSIS — M0569 Rheumatoid arthritis of multiple sites with involvement of other organs and systems: Secondary | ICD-10-CM | POA: Diagnosis not present

## 2014-12-20 ENCOUNTER — Telehealth: Payer: Self-pay

## 2014-12-20 NOTE — Telephone Encounter (Signed)
No she absolutely needs a psychiatrist to make that decision unfortunately.  That is outside my scope of practice to tell her whether or not to stop taking an antipsychotic.

## 2014-12-20 NOTE — Telephone Encounter (Signed)
Lm on Stephanie's vm and provided verbal orders as requested

## 2014-12-20 NOTE — Telephone Encounter (Signed)
Pt left v/m; pt saw Dr Jaynee Eagles neurology on 12/15/14 and pt was to contact psychiatrist about possibly stopping abilify; pt left v/m that Dr [facility] office is no longer in pts network and will not return her call and wants to know if Dr Deborra Medina thinks OK for pt to stop abilify; pt said abilify can cause parkinson's symptoms. Pt request cb. Pt last seen 12/14/14.

## 2014-12-20 NOTE — Telephone Encounter (Signed)
Ok to give verbal orders as requested. 

## 2014-12-20 NOTE — Telephone Encounter (Signed)
Spoke to pt and advised per Dr Deborra Medina. Pt verbally expressed understanding and was also advised of extension of home health OT

## 2014-12-20 NOTE — Telephone Encounter (Signed)
Lori Phillips OT with [facility] left v/m requesting verbal orders for home health OT for 4 weeks.Please advise.

## 2014-12-21 DIAGNOSIS — F3160 Bipolar disorder, current episode mixed, unspecified: Secondary | ICD-10-CM | POA: Diagnosis not present

## 2014-12-21 DIAGNOSIS — M0569 Rheumatoid arthritis of multiple sites with involvement of other organs and systems: Secondary | ICD-10-CM | POA: Diagnosis not present

## 2014-12-21 DIAGNOSIS — I10 Essential (primary) hypertension: Secondary | ICD-10-CM | POA: Diagnosis not present

## 2014-12-21 DIAGNOSIS — I2510 Atherosclerotic heart disease of native coronary artery without angina pectoris: Secondary | ICD-10-CM | POA: Diagnosis not present

## 2014-12-21 DIAGNOSIS — E1121 Type 2 diabetes mellitus with diabetic nephropathy: Secondary | ICD-10-CM | POA: Diagnosis not present

## 2014-12-21 DIAGNOSIS — G20 Parkinson's disease: Secondary | ICD-10-CM | POA: Diagnosis not present

## 2014-12-26 ENCOUNTER — Telehealth: Payer: Self-pay | Admitting: Pain Medicine

## 2014-12-26 DIAGNOSIS — I2510 Atherosclerotic heart disease of native coronary artery without angina pectoris: Secondary | ICD-10-CM | POA: Diagnosis not present

## 2014-12-26 DIAGNOSIS — I10 Essential (primary) hypertension: Secondary | ICD-10-CM | POA: Diagnosis not present

## 2014-12-26 DIAGNOSIS — G20 Parkinson's disease: Secondary | ICD-10-CM | POA: Diagnosis not present

## 2014-12-26 DIAGNOSIS — M0569 Rheumatoid arthritis of multiple sites with involvement of other organs and systems: Secondary | ICD-10-CM | POA: Diagnosis not present

## 2014-12-26 DIAGNOSIS — E1121 Type 2 diabetes mellitus with diabetic nephropathy: Secondary | ICD-10-CM | POA: Diagnosis not present

## 2014-12-26 DIAGNOSIS — F3160 Bipolar disorder, current episode mixed, unspecified: Secondary | ICD-10-CM | POA: Diagnosis not present

## 2014-12-26 NOTE — Telephone Encounter (Signed)
May schedule for evaluation Wednesday, September 14 Please discuss with me

## 2014-12-26 NOTE — Telephone Encounter (Signed)
Patient has been at [facility] learning to walk again and get strength back / needs to come for med refill wed if possible

## 2014-12-26 NOTE — Telephone Encounter (Signed)
Last appt. 09-28-14

## 2014-12-27 ENCOUNTER — Other Ambulatory Visit: Payer: Self-pay | Admitting: Family Medicine

## 2014-12-27 ENCOUNTER — Telehealth: Payer: Self-pay | Admitting: Internal Medicine

## 2014-12-27 MED ORDER — LEVOTHYROXINE SODIUM 125 MCG PO TABS: 125.0000 ug | ORAL_TABLET | Freq: Every day | ORAL | Status: DC

## 2014-12-27 NOTE — Telephone Encounter (Signed)
This pt has not had lipid labs since 06/2013. Last OV 11/2014

## 2014-12-27 NOTE — Telephone Encounter (Signed)
Patient need a new prescription for Levothyroxine.  [id number] - [place], [place] [facility] [address] Shari Prows [phone] (Phone) [phone] (Fax)

## 2014-12-27 NOTE — Telephone Encounter (Signed)
Done

## 2014-12-28 DIAGNOSIS — I10 Essential (primary) hypertension: Secondary | ICD-10-CM | POA: Diagnosis not present

## 2014-12-28 DIAGNOSIS — F3160 Bipolar disorder, current episode mixed, unspecified: Secondary | ICD-10-CM | POA: Diagnosis not present

## 2014-12-28 DIAGNOSIS — G20 Parkinson's disease: Secondary | ICD-10-CM | POA: Diagnosis not present

## 2014-12-28 DIAGNOSIS — M0569 Rheumatoid arthritis of multiple sites with involvement of other organs and systems: Secondary | ICD-10-CM | POA: Diagnosis not present

## 2014-12-28 DIAGNOSIS — I2510 Atherosclerotic heart disease of native coronary artery without angina pectoris: Secondary | ICD-10-CM | POA: Diagnosis not present

## 2014-12-28 DIAGNOSIS — E1121 Type 2 diabetes mellitus with diabetic nephropathy: Secondary | ICD-10-CM | POA: Diagnosis not present

## 2014-12-29 DIAGNOSIS — G20 Parkinson's disease: Secondary | ICD-10-CM | POA: Diagnosis not present

## 2014-12-29 DIAGNOSIS — M0569 Rheumatoid arthritis of multiple sites with involvement of other organs and systems: Secondary | ICD-10-CM | POA: Diagnosis not present

## 2014-12-29 DIAGNOSIS — E1121 Type 2 diabetes mellitus with diabetic nephropathy: Secondary | ICD-10-CM | POA: Diagnosis not present

## 2014-12-29 DIAGNOSIS — I2510 Atherosclerotic heart disease of native coronary artery without angina pectoris: Secondary | ICD-10-CM | POA: Diagnosis not present

## 2014-12-29 DIAGNOSIS — F3160 Bipolar disorder, current episode mixed, unspecified: Secondary | ICD-10-CM | POA: Diagnosis not present

## 2014-12-29 DIAGNOSIS — I10 Essential (primary) hypertension: Secondary | ICD-10-CM | POA: Diagnosis not present

## 2015-01-01 DIAGNOSIS — G20 Parkinson's disease: Secondary | ICD-10-CM | POA: Diagnosis not present

## 2015-01-01 DIAGNOSIS — I2510 Atherosclerotic heart disease of native coronary artery without angina pectoris: Secondary | ICD-10-CM | POA: Diagnosis not present

## 2015-01-01 DIAGNOSIS — E1121 Type 2 diabetes mellitus with diabetic nephropathy: Secondary | ICD-10-CM | POA: Diagnosis not present

## 2015-01-01 DIAGNOSIS — I10 Essential (primary) hypertension: Secondary | ICD-10-CM | POA: Diagnosis not present

## 2015-01-01 DIAGNOSIS — F3160 Bipolar disorder, current episode mixed, unspecified: Secondary | ICD-10-CM | POA: Diagnosis not present

## 2015-01-01 DIAGNOSIS — M0569 Rheumatoid arthritis of multiple sites with involvement of other organs and systems: Secondary | ICD-10-CM | POA: Diagnosis not present

## 2015-01-02 ENCOUNTER — Telehealth: Payer: Self-pay

## 2015-01-02 DIAGNOSIS — E1121 Type 2 diabetes mellitus with diabetic nephropathy: Secondary | ICD-10-CM | POA: Diagnosis not present

## 2015-01-02 DIAGNOSIS — M0569 Rheumatoid arthritis of multiple sites with involvement of other organs and systems: Secondary | ICD-10-CM | POA: Diagnosis not present

## 2015-01-02 DIAGNOSIS — I2510 Atherosclerotic heart disease of native coronary artery without angina pectoris: Secondary | ICD-10-CM | POA: Diagnosis not present

## 2015-01-02 DIAGNOSIS — F3160 Bipolar disorder, current episode mixed, unspecified: Secondary | ICD-10-CM | POA: Diagnosis not present

## 2015-01-02 DIAGNOSIS — I10 Essential (primary) hypertension: Secondary | ICD-10-CM | POA: Diagnosis not present

## 2015-01-02 DIAGNOSIS — G20 Parkinson's disease: Secondary | ICD-10-CM | POA: Diagnosis not present

## 2015-01-02 NOTE — Telephone Encounter (Signed)
Please call Lori Phillips and pt to get more information.

## 2015-01-02 NOTE — Telephone Encounter (Signed)
Santiago Glad speech therapist with [facility] left v/m; pt's BP was elevated this weekend. Today BP 140/70 P 46-50. Pt was listless today and had h/a. Santiago Glad request cb.

## 2015-01-03 ENCOUNTER — Telehealth: Payer: Self-pay | Admitting: *Deleted

## 2015-01-03 ENCOUNTER — Ambulatory Visit: Payer: Medicare Other | Attending: Pain Medicine | Admitting: Pain Medicine

## 2015-01-03 ENCOUNTER — Encounter: Payer: Self-pay | Admitting: Pain Medicine

## 2015-01-03 VITALS — BP 123/50 | HR 51 | Temp 97.7°F | Resp 15 | Ht 64.0 in | Wt 165.0 lb

## 2015-01-03 DIAGNOSIS — M533 Sacrococcygeal disorders, not elsewhere classified: Secondary | ICD-10-CM | POA: Diagnosis not present

## 2015-01-03 DIAGNOSIS — M5134 Other intervertebral disc degeneration, thoracic region: Secondary | ICD-10-CM | POA: Diagnosis not present

## 2015-01-03 DIAGNOSIS — G20 Parkinson's disease: Secondary | ICD-10-CM | POA: Insufficient documentation

## 2015-01-03 DIAGNOSIS — M47818 Spondylosis without myelopathy or radiculopathy, sacral and sacrococcygeal region: Secondary | ICD-10-CM

## 2015-01-03 DIAGNOSIS — M47816 Spondylosis without myelopathy or radiculopathy, lumbar region: Secondary | ICD-10-CM

## 2015-01-03 DIAGNOSIS — M545 Low back pain: Secondary | ICD-10-CM | POA: Diagnosis present

## 2015-01-03 DIAGNOSIS — M5136 Other intervertebral disc degeneration, lumbar region: Secondary | ICD-10-CM | POA: Diagnosis not present

## 2015-01-03 DIAGNOSIS — F3160 Bipolar disorder, current episode mixed, unspecified: Secondary | ICD-10-CM | POA: Diagnosis not present

## 2015-01-03 DIAGNOSIS — M461 Sacroiliitis, not elsewhere classified: Secondary | ICD-10-CM | POA: Diagnosis not present

## 2015-01-03 DIAGNOSIS — M792 Neuralgia and neuritis, unspecified: Secondary | ICD-10-CM | POA: Diagnosis not present

## 2015-01-03 DIAGNOSIS — E1121 Type 2 diabetes mellitus with diabetic nephropathy: Secondary | ICD-10-CM | POA: Diagnosis not present

## 2015-01-03 DIAGNOSIS — M546 Pain in thoracic spine: Secondary | ICD-10-CM | POA: Diagnosis present

## 2015-01-03 DIAGNOSIS — F1120 Opioid dependence, uncomplicated: Secondary | ICD-10-CM | POA: Diagnosis not present

## 2015-01-03 DIAGNOSIS — M47894 Other spondylosis, thoracic region: Secondary | ICD-10-CM

## 2015-01-03 DIAGNOSIS — M51369 Other intervertebral disc degeneration, lumbar region without mention of lumbar back pain or lower extremity pain: Secondary | ICD-10-CM

## 2015-01-03 DIAGNOSIS — I2510 Atherosclerotic heart disease of native coronary artery without angina pectoris: Secondary | ICD-10-CM | POA: Diagnosis not present

## 2015-01-03 DIAGNOSIS — M4184 Other forms of scoliosis, thoracic region: Secondary | ICD-10-CM | POA: Insufficient documentation

## 2015-01-03 DIAGNOSIS — Z79899 Other long term (current) drug therapy: Secondary | ICD-10-CM | POA: Diagnosis not present

## 2015-01-03 DIAGNOSIS — G588 Other specified mononeuropathies: Secondary | ICD-10-CM

## 2015-01-03 DIAGNOSIS — M47814 Spondylosis without myelopathy or radiculopathy, thoracic region: Secondary | ICD-10-CM

## 2015-01-03 DIAGNOSIS — Z0389 Encounter for observation for other suspected diseases and conditions ruled out: Secondary | ICD-10-CM | POA: Diagnosis not present

## 2015-01-03 DIAGNOSIS — Z5181 Encounter for therapeutic drug level monitoring: Secondary | ICD-10-CM | POA: Diagnosis not present

## 2015-01-03 DIAGNOSIS — M47817 Spondylosis without myelopathy or radiculopathy, lumbosacral region: Secondary | ICD-10-CM | POA: Diagnosis not present

## 2015-01-03 DIAGNOSIS — I10 Essential (primary) hypertension: Secondary | ICD-10-CM | POA: Diagnosis not present

## 2015-01-03 DIAGNOSIS — M5416 Radiculopathy, lumbar region: Secondary | ICD-10-CM | POA: Diagnosis not present

## 2015-01-03 DIAGNOSIS — M0569 Rheumatoid arthritis of multiple sites with involvement of other organs and systems: Secondary | ICD-10-CM | POA: Diagnosis not present

## 2015-01-03 NOTE — Progress Notes (Signed)
Safety precautions to be maintained throughout the outpatient stay will include: orient to surroundings, keep bed in low position, maintain call bell within reach at all times, provide assistance with transfer out of bed and ambulation.  

## 2015-01-03 NOTE — Telephone Encounter (Signed)
Received records from [place] went to Dr Jaynee Eagles and Terrence Dupont 01/03/15.

## 2015-01-03 NOTE — Telephone Encounter (Signed)
Lm on Lori Phillips's vm requesting a call back

## 2015-01-03 NOTE — Progress Notes (Signed)
   Subjective:    Patient ID: NAYELIE GIONFRIDDO, female    DOB: 03/04/1956, 59 y.o.   MRN: [id number]  HPI  Patient is 59 year old female returns to pain management for further evaluation and treatment of pain involving the lower back and lower extremity region predominantly with some mid back pain of lesser degree. She has been in rehabilitation undergoing treatment including treatment addressing her gait. At the present time we will avoid modifying treatment regimen and we'll avoid prescribing medications as well. Patient will follow-up with her primary care physician Dr. Deborra Medina and her psychiatrist Dr. Randel Books and we will consider modifications of treatment at time return appointment is discussed with patient on today's visit.     Review of Systems     Objective:   Physical Exam  There was tennis over the splenius capitis and occipitalis region of mild degree. There was mild tennis over the region of the cervical facet cervical paraspinal musculature region. Palpation of the acromioclavicular glenohumeral joint region was with minimal tenderness to palpation. Patient appeared to be with bilaterally equal grip strength. Tinel and Phalen's maneuver was without increased pain of significant degree. There was tenderness to palpation of the thoracic facet thoracic paraspinal muscles of mild degree upper thoracic region and moderate degree of the lower thoracic paraspinal musculature region with evidence of muscle spasms. No crepitus of the thoracic region was noted. Palpation over the lumbar paraspinal muscles lumbar facet region associated with moderate to moderately severe discomfort. There was moderate to moderately severe tenderness to palpation over the PSIS and PII S regions. Straight leg raising was limited to approximately 20 without increased pain with dorsiflexion noted. There was moderate tends to palpation of the greater trochanteric region iliotibial band region. EHL strength was decreased  .  There was nodefinite sensory deficit of dermatomal distribution was detected. There was negative Homans palpation over the lumbar facets lumbar paraspinal musculature region reproduces predominant portion of patient's pain           Assessment & Plan:    Degenerative disc disease thoracic spine T7-T8 degenerative disc disease, spondylitic scoliosis and mild to moderate eccentric impingement at T7-8 and borderline left eccentric impingement T 1112, T12-L1, probable cyst of the kidney.  Degenerative disc disease lumbar spine spondylosis multilevel involvement with mild left-sided impingement at L4-5 Lumbar facet syndrome  Intercostal neuralgia  Sacroiliac joint dysfunction  Parkinson's    PLAN   Continue present medication  F/U PCP Dr. Deborra Medina for evaliation of  BP and general medical  condition  F/U surgical evaluation. May consider pending follow-up evaluations as discussed with patient on today's visit  F/U neurological evaluation. We will just patient undergoing further neurological assessment on today's visit  F/U psych evaluation with Dr. Randel Books as discussed  May consider radiofrequency rhizolysis or intraspinal procedures pending response to present treatment and F/U evaluation . At the present time we will avoid interventional treatment as discussed with patient on today's visit  Patient to call [facility] Center should patient have concerns prior to scheduled return appointment.

## 2015-01-03 NOTE — Telephone Encounter (Signed)
Noted.  Please keep [place] updated.

## 2015-01-03 NOTE — Patient Instructions (Addendum)
PLAN   Continue present medication  F/U PCP Dr.Aron   for evaliation of  BP and general medical  condition  F/U surgical evaluation. May consider pending follow-up evaluations  F/U neurological evaluation. May consider pending follow-up evaluations.  F/U Dr. Jacqualine Code as planned  May consider radiofrequency rhizolysis or intraspinal procedures pending response to present treatment and F/U evaluation   Patient to call [facility] Management Center should patient have concerns prior to scheduled return appointment.

## 2015-01-03 NOTE — Telephone Encounter (Signed)
Spoke to Santiago Glad, who states that the pts pulse was originally 46BPM but increased with motion. She states that she spoke to the pt last pm, but her condition was about the same, and a HA was still present. Santiago Glad states that she will send the nurse out on today to do an assessment.

## 2015-01-05 DIAGNOSIS — E1121 Type 2 diabetes mellitus with diabetic nephropathy: Secondary | ICD-10-CM | POA: Diagnosis not present

## 2015-01-05 DIAGNOSIS — I2510 Atherosclerotic heart disease of native coronary artery without angina pectoris: Secondary | ICD-10-CM | POA: Diagnosis not present

## 2015-01-05 DIAGNOSIS — G20 Parkinson's disease: Secondary | ICD-10-CM | POA: Diagnosis not present

## 2015-01-05 DIAGNOSIS — M0569 Rheumatoid arthritis of multiple sites with involvement of other organs and systems: Secondary | ICD-10-CM | POA: Diagnosis not present

## 2015-01-05 DIAGNOSIS — I10 Essential (primary) hypertension: Secondary | ICD-10-CM | POA: Diagnosis not present

## 2015-01-05 DIAGNOSIS — F3160 Bipolar disorder, current episode mixed, unspecified: Secondary | ICD-10-CM | POA: Diagnosis not present

## 2015-01-05 NOTE — Telephone Encounter (Signed)
That pulse rate isn't emergent in itself, if not dizzy.   I'll defer to PCP, esp with RN to recheck pt later today.

## 2015-01-05 NOTE — Telephone Encounter (Signed)
Pinch therapist with [facility] said pt's pulse 47 BP 120/70; pt feels like has no energy,no h/a or dizziness. No CP or SOB. Pt was seen at pain clinic on 01/04/15 BP 130/50. Today has back pain  (pain level is 8 )and pt does not feel like moving around to see if Pulse will increase. Pt has not had any pain med this morning. Santiago Glad said [employer] nurse will ck pt later today. Pt does not have transportation to come to office. Santiago Glad request cb. Dr Deborra Medina out of office but will send note to Dr Deborra Medina and Dr. Damita Dunnings for review.

## 2015-01-05 NOTE — Telephone Encounter (Signed)
Lori Phillips notified per result note. Lori Phillips voiced understanding and did verify that RN will see pt later today.

## 2015-01-09 ENCOUNTER — Other Ambulatory Visit: Payer: Self-pay | Admitting: Pain Medicine

## 2015-01-10 DIAGNOSIS — M0569 Rheumatoid arthritis of multiple sites with involvement of other organs and systems: Secondary | ICD-10-CM | POA: Diagnosis not present

## 2015-01-10 DIAGNOSIS — I2510 Atherosclerotic heart disease of native coronary artery without angina pectoris: Secondary | ICD-10-CM | POA: Diagnosis not present

## 2015-01-10 DIAGNOSIS — G20 Parkinson's disease: Secondary | ICD-10-CM | POA: Diagnosis not present

## 2015-01-10 DIAGNOSIS — I10 Essential (primary) hypertension: Secondary | ICD-10-CM | POA: Diagnosis not present

## 2015-01-10 DIAGNOSIS — E1121 Type 2 diabetes mellitus with diabetic nephropathy: Secondary | ICD-10-CM | POA: Diagnosis not present

## 2015-01-10 DIAGNOSIS — F3160 Bipolar disorder, current episode mixed, unspecified: Secondary | ICD-10-CM | POA: Diagnosis not present

## 2015-01-11 DIAGNOSIS — I10 Essential (primary) hypertension: Secondary | ICD-10-CM | POA: Diagnosis not present

## 2015-01-11 DIAGNOSIS — I2510 Atherosclerotic heart disease of native coronary artery without angina pectoris: Secondary | ICD-10-CM | POA: Diagnosis not present

## 2015-01-11 DIAGNOSIS — G20 Parkinson's disease: Secondary | ICD-10-CM | POA: Diagnosis not present

## 2015-01-11 DIAGNOSIS — M0569 Rheumatoid arthritis of multiple sites with involvement of other organs and systems: Secondary | ICD-10-CM | POA: Diagnosis not present

## 2015-01-11 DIAGNOSIS — F3160 Bipolar disorder, current episode mixed, unspecified: Secondary | ICD-10-CM | POA: Diagnosis not present

## 2015-01-11 DIAGNOSIS — E1121 Type 2 diabetes mellitus with diabetic nephropathy: Secondary | ICD-10-CM | POA: Diagnosis not present

## 2015-01-15 ENCOUNTER — Other Ambulatory Visit: Payer: Self-pay | Admitting: *Deleted

## 2015-01-15 ENCOUNTER — Other Ambulatory Visit (INDEPENDENT_AMBULATORY_CARE_PROVIDER_SITE_OTHER): Payer: Medicare Other | Admitting: *Deleted

## 2015-01-15 ENCOUNTER — Ambulatory Visit (INDEPENDENT_AMBULATORY_CARE_PROVIDER_SITE_OTHER): Payer: Medicare Other | Admitting: Internal Medicine

## 2015-01-15 ENCOUNTER — Encounter: Payer: Self-pay | Admitting: Internal Medicine

## 2015-01-15 VITALS — BP 118/80 | HR 64 | Temp 97.8°F | Resp 12 | Wt 159.0 lb

## 2015-01-15 DIAGNOSIS — Z23 Encounter for immunization: Secondary | ICD-10-CM

## 2015-01-15 DIAGNOSIS — E038 Other specified hypothyroidism: Secondary | ICD-10-CM

## 2015-01-15 MED ORDER — INSULIN PEN NEEDLE 30G X 5 MM MISC: Status: DC

## 2015-01-15 NOTE — Patient Instructions (Addendum)
Please continue current diabetes regimen: - Metformin 500 mg 2x a day  -  NovoLog 5-7 units depending the size of the meal  Take the thyroid hormone every day, with water, >30 minutes before breakfast, separated by >4 hours from acid reflux medications, calcium, iron, multivitamins. Please move Protonix at dinnertime.  Please return in 3 month with your sugar log.

## 2015-01-15 NOTE — Progress Notes (Addendum)
Patient ID: Lori Phillips, female   DOB: 1955/10/26, 59 y.o.   MRN: [id number]  HPI: Lori Phillips is a 59 y.o.-year-old female, returning for f/u for DM2, dx 2009, insulin-dependent since dx, uncontrolled, with complications (?CAD, gastroparesis, PN) and hypothyroidism. Last visit 1.5 mo.  DM2:  Last hemoglobin A1c was: Lab Results  Component Value Date   HGBA1C 7.3* 11/29/2014   HGBA1C 8.7* 07/05/2013   HGBA1C 8.2* 04/26/2012   Pt is on a regimen of: - NovoLog SSI 5 units before b'fast and 7 units before dinner - Metformin 500 mg 2x a day Off Levemir 41 >> 30 units qam >> stopped as she cannot give herself the injection  (did not have a pen as she has tremors) She is off Victoza 1.8 mg and Glipizide-Metformin.  Pt used to check sugars 1-3x day: - am: 100-125 >> 57-146 (75-104) >> 87-106 >> 90-130s -  2h after b'fast: 73, 96, 160 >> n/c >> 148 - lunch: 91-135 >> 100-150? >>  - 2h after lunch: 113, 138 >> n/c - dinner: 83-198 (100-130) >> 202-220 >> 229 (fast food) - 2h after dinner: 117-192 >> n/c - bedtime: 108-157 >> 220-230 >> n/c - nighttime: 115-175 Lowest sugar was 70s >> 90; she has hypoglycemia awareness 61. Highest sugar was 200s.   She saw nutrition in the past.   - has mild CKD, last BUN/creatinine:  Lab Results  Component Value Date   BUN 13 10/12/2014   CREATININE 1.00 10/12/2014  On Lisinopril. - last set of lipids: Lab Results  Component Value Date   CHOL 250* 07/05/2013   HDL 46.20 07/05/2013   LDLCALC 80 07/05/2013   LDLDIRECT 47.8 04/13/2013   TRIG 618.0* 07/05/2013   CHOLHDL 5 07/05/2013  On Crestor 20. - last eye exam was in 10/2014 . No DR. Had cataract sx bilat.  - + numbness and tingling in her feet. Last foot exam: by PCP: 07/06/2013.  Hypothyroidism: - Since "~20 years ago" - She is on levothyroxine taken:  - in am - fasting - with water - separated from b'fast by >30 min - still taking PPI in am! - no Ca, Fe, MVI  Last TFTs: Lab  Results  Component Value Date   TSH 14.95* 11/29/2014   TSH 0.03* 09/26/2014   TSH 0.12* 07/04/2014   TSH 0.16* 08/26/2013   TSH 0.34* 07/05/2013   FREET4 0.72 11/29/2014   FREET4 1.74* 09/26/2014   FREET4 1.35 07/04/2014   FREET4 1.44 08/26/2013   FREET4 0.76 07/05/2013   LT4 dose was decreased after last check to 137 then 125 mcg daily. Now on this dose.  ROS: Constitutional: no weight gain/loss, no fatigue, no subjective hyperthermia/hypothermia, + nocturia, + poor sleep Eyes: + blurry vision, no xerophthalmia ENT: no sore throat, no nodules palpated in throat, no dysphagia/odynophagia, no hoarseness Cardiovascular: no CP/SOB/palpitations/leg swelling Respiratory: no cough/SOB Gastrointestinal: no N/V/+ D/n0 C/+ heartburn Musculoskeletal: no muscle/+ joint aches Skin: no rashes Neurological: no tremors/numbness/tingling/dizziness  I reviewed pt's medications, allergies, PMH, social hx, family hx, and changes were documented in the history of present illness. Otherwise, unchanged from my initial visit note. Stopped Abilify >> movement worse.   Past Medical History  Diagnosis Date  . CAD (coronary artery disease)     multiple caths  . Diabetes mellitus   . Hyperlipidemia   . Hypertension   . Thyroid disease     hypothyroid  . Low back pain   . Nephrolithiasis   . Fibromyalgia   .  Bipolar disorder   . Anxiety   . Panic attacks   . Enlarged liver   . Rheumatoid arthritis   . Enlarged pituitary gland   . Fibromyalgia   . Scoliosis   . Osteoporosis   . Osteoarthritis   . Kidney stones   . Hypothyroidism   . Allergy   . Parkinson's disease 2016   Past Surgical History  Procedure Laterality Date  . Vaginal hysterectomy    . Tonsillectomy    . Colonoscopy  multiple    2005 [place] records not available-polyps x 3  . Cardiac catheterization      x3  . Esophageal dilation    . Kidney stone removal     Social History   Social History  . Marital Status:  Single    Spouse Name: N/A  . Number of Children: 3  . Years of Education: 12   Occupational History  . disability  Other   Social History Main Topics  . Smoking status: Never Smoker   . Smokeless tobacco: Never Used  . Alcohol Use: Yes     Comment: socially  . Drug Use: No  . Sexual Activity: Not on file   Other Topics Concern  . Not on file   Social History Narrative   Has a living will- desires CPR but no prolonged life support if futile.   Caffeine use: Drinks 2 L soda per day   Drinks 2 glasses tea/day   Current Outpatient Prescriptions on File Prior to Visit  Medication Sig Dispense Refill  . acetaminophen (TYLENOL) 650 MG CR tablet Take 650 mg by mouth 3 (three) times daily as needed for pain.    Marland Kitchen amitriptyline (ELAVIL) 10 MG tablet Take 1 tablet (10 mg total) by mouth at bedtime as needed. 30 tablet 0  . carbidopa-levodopa (SINEMET IR) 25-100 MG per tablet TAKE 1 TABLET BY MOUTH THREE TIMES DAILY 270 tablet 0  . clonazePAM (KLONOPIN) 0.5 MG tablet Take 1 tablet (0.5 mg total) by mouth daily as needed. 30 tablet 0  . diazepam (VALIUM) 5 MG tablet Take 2.5 mg by mouth at bedtime. Take 1/2 tab at bedtime    . diltiazem (CARDIZEM CD) 180 MG 24 hr capsule TAKE ONE CAPSULE BY MOUTH EVERY DAY 90 capsule 0  . divalproex (DEPAKOTE ER) 500 MG 24 hr tablet TAKE 1 TABLET BY MOUTH TWICE DAILY 60 tablet 0  . gabapentin (NEURONTIN) 100 MG capsule Take 300 mg by mouth. Take 100mg  every am and 200mg  every pm    . insulin aspart (NOVOLOG) 100 UNIT/ML injection Inject 5-7 Units into the skin 3 (three) times daily before meals. Inject 5units subQ every am and 7units subQ every pm    . insulin lispro (HUMALOG) 100 UNIT/ML injection Inject 5-7 Units into the skin 3 (three) times daily before meals. 10 mL 1  . levothyroxine (SYNTHROID, LEVOTHROID) 125 MCG tablet Take 1 tablet (125 mcg total) by mouth daily. 60 tablet 1  . lisinopril (PRINIVIL,ZESTRIL) 5 MG tablet Take 1 tablet (5 mg total) by  mouth daily. 30 tablet 5  . metFORMIN (GLUCOPHAGE) 500 MG tablet TAKE 1 TABLET(500 MG) BY MOUTH TWICE DAILY WITH A MEAL 180 tablet 1  . Nystatin (NYAMYC EX) Apply 100,000 Units topically 2 (two) times daily.    . Oxycodone HCl 10 MG TABS Limited 1 tab by mouth twice a day to 3 times a day if tolerated 90 tablet 0  . pantoprazole (PROTONIX) 40 MG tablet Take 1 tablet (  40 mg total) by mouth daily. 90 tablet 0  . rosuvastatin (CRESTOR) 20 MG tablet Take 1 tablet (20 mg total) by mouth daily. 30 tablet 0  . rosuvastatin (CRESTOR) 20 MG tablet TAKE 1 TABLET BY MOUTH DAILY. 30 tablet 0  . vitamin B-12 (CYANOCOBALAMIN) 500 MCG tablet Take 500 mcg by mouth daily.    . insulin detemir (LEVEMIR) 100 UNIT/ML injection Inject 30 Units into the skin at bedtime.    . [DISCONTINUED] TAZTIA XT 180 MG 24 hr capsule TAKE ONE CAPSULE BY MOUTH EVERY DAY 90 capsule 0   No current facility-administered medications on file prior to visit.  + Levemir 41 units in a.m. + NovoLog sliding scale   Allergies  Allergen Reactions  . Byetta 10 Mcg Pen [Exenatide] Nausea And Vomiting    Other reaction(s): Dizziness and giddiness (finding) incoherent  . Glyburide     Other reaction(s): Dizziness and giddiness (finding), Other (qualifier value) confusion  . Quetiapine   . Seroquel  [Quetiapine Fumarate]     Other reaction(s): Diarrhea and vomiting (finding)   Family History  Problem Relation Age of Onset  . Hypertension Mother   . Diabetes Mother   . Bipolar disorder Father   . Colon cancer Paternal Aunt   . Colon cancer Paternal Grandmother   . Thyroid disease Neg Hx   . Hypertension Father    PE: BP 118/80 mmHg  Pulse 64  Temp(Src) 97.8 F (36.6 C) (Oral)  Resp 12  Wt 159 lb (72.122 kg)  SpO2 95% Wt Readings from Last 3 Encounters:  01/15/15 159 lb (72.122 kg)  01/03/15 165 lb (74.844 kg)  12/15/14 168 lb 12.8 oz (76.567 kg)   Constitutional: overweight, in NAD, in wheelchair Eyes: PERRLA, EOMI, no  exophthalmos ENT: moist mucous membranes, no thyromegaly, no cervical lymphadenopathy Cardiovascular: RRR, No MRG Respiratory: CTA B Gastrointestinal: abdomen soft, NT, ND, BS+ Musculoskeletal: no deformities, strength intact in all 4 Skin: moist, warm, no rashes Neurological: + tremor with outstretched hands, DTR normal in all 4  ASSESSMENT: 1. DM2, insulin-dependent, uncontrolled, with complications - ?CAD - had CP in the past >> has 2 catetherizations: 2000 and 2001 - attributed CP to anxiety - PN - gastroparesis  2. Hypothyroidism  PLAN:  1. Patient with long-standing, uncontrolled diabetes, off basal insulin now, with improved sugars after started Metformin so that she can now stay off long acting insulin. - I suggested to:  Patient Instructions  Please continue current diabetes regimen: - Metformin 500 mg 2x a day  -  NovoLog 5-7 units depending the size of the meal  Take the thyroid hormone every day, with water, >30 minutes before breakfast, separated by >4 hours from acid reflux medications, calcium, iron, multivitamins. Please move Protonix at dinnertime.  Please return in 3 month with your sugar log.  - continue checking sugars at different times of the day - check 2-3 times a day, rotating checks. She is doing a great job with this per review of her log. - She is up-to-date with eye exams - Return to clinic in 3 mo with sugar log   2. Hypothyroidism - She has uncontrolled hypothyroidism, on levothyroxine - with high TSH at last visit as she is not taking her levothyroxine correctly, not separating her PPI from the thyroid hormone in a.m.  We discussed to take the thyroid hormone every day, with water, >30 minutes before breakfast, separated by >4 hours from acid reflux medications, calcium, iron, multivitamins. She did not move  Protonix later >> advised her to do so now - We'll check her TFTs at next visit - We'll repeat her thyroid tests when she returns in 3 mo

## 2015-01-17 ENCOUNTER — Emergency Department: Payer: Medicare Other

## 2015-01-17 ENCOUNTER — Emergency Department
Admission: EM | Admit: 2015-01-17 | Discharge: 2015-01-17 | Disposition: A | Discharge status: Home / Self Care | Payer: Medicare Other | Attending: Emergency Medicine | Admitting: Emergency Medicine

## 2015-01-17 ENCOUNTER — Encounter: Payer: Self-pay | Admitting: Emergency Medicine

## 2015-01-17 DIAGNOSIS — R531 Weakness: Secondary | ICD-10-CM | POA: Diagnosis not present

## 2015-01-17 DIAGNOSIS — I10 Essential (primary) hypertension: Secondary | ICD-10-CM | POA: Insufficient documentation

## 2015-01-17 DIAGNOSIS — Z794 Long term (current) use of insulin: Secondary | ICD-10-CM | POA: Insufficient documentation

## 2015-01-17 DIAGNOSIS — Z79899 Other long term (current) drug therapy: Secondary | ICD-10-CM | POA: Insufficient documentation

## 2015-01-17 DIAGNOSIS — E1143 Type 2 diabetes mellitus with diabetic autonomic (poly)neuropathy: Secondary | ICD-10-CM | POA: Diagnosis not present

## 2015-01-17 DIAGNOSIS — M6281 Muscle weakness (generalized): Secondary | ICD-10-CM | POA: Diagnosis not present

## 2015-01-17 DIAGNOSIS — R4182 Altered mental status, unspecified: Secondary | ICD-10-CM | POA: Diagnosis not present

## 2015-01-17 DIAGNOSIS — G20 Parkinson's disease: Secondary | ICD-10-CM | POA: Diagnosis not present

## 2015-01-17 DIAGNOSIS — R0602 Shortness of breath: Secondary | ICD-10-CM | POA: Diagnosis not present

## 2015-01-17 DIAGNOSIS — R29898 Other symptoms and signs involving the musculoskeletal system: Secondary | ICD-10-CM

## 2015-01-17 LAB — COMPREHENSIVE METABOLIC PANEL
ALBUMIN: 3.7 g/dL (ref 3.5–5.0)
ALK PHOS: 65 U/L (ref 38–126)
AST: 20 U/L (ref 15–41)
Anion gap: 9 (ref 5–15)
BILIRUBIN TOTAL: 0.9 mg/dL (ref 0.3–1.2)
BUN: 10 mg/dL (ref 6–20)
CALCIUM: 9.6 mg/dL (ref 8.9–10.3)
CO2: 28 mmol/L (ref 22–32)
CREATININE: 1.12 mg/dL — AB (ref 0.44–1.00)
Chloride: 104 mmol/L (ref 101–111)
GFR calc Af Amer: >60 mL/min (ref >60)
GFR calc non Af Amer: 52 mL/min — ABNORMAL LOW (ref >60)
GLUCOSE: 131 mg/dL — AB (ref 65–99)
Potassium: 3.3 mmol/L — ABNORMAL LOW (ref 3.5–5.1)
SODIUM: 141 mmol/L (ref 135–145)
TOTAL PROTEIN: 6.5 g/dL (ref 6.5–8.1)

## 2015-01-17 LAB — URINALYSIS COMPLETE WITH MICROSCOPIC (ARMC ONLY)
BACTERIA UA: NONE SEEN
Bilirubin Urine: NEGATIVE
GLUCOSE, UA: NEGATIVE mg/dL
Leukocytes, UA: NEGATIVE
Nitrite: NEGATIVE
Protein, ur: 30 mg/dL — AB
RBC / HPF: NONE SEEN RBC/hpf (ref 0–5)
Specific Gravity, Urine: 1.018 (ref 1.005–1.030)
pH: 6 (ref 5.0–8.0)

## 2015-01-17 LAB — CBC
HEMATOCRIT: 40.6 % (ref 35.0–47.0)
HEMOGLOBIN: 13.4 g/dL (ref 12.0–16.0)
MCH: 29.1 pg (ref 26.0–34.0)
MCHC: 33.1 g/dL (ref 32.0–36.0)
MCV: 88 fL (ref 80.0–100.0)
Platelets: 147 10*3/uL — ABNORMAL LOW (ref 150–440)
RBC: 4.62 MIL/uL (ref 3.80–5.20)
RDW: 13.5 % (ref 11.5–14.5)
WBC: 9.7 10*3/uL (ref 3.6–11.0)

## 2015-01-17 LAB — TROPONIN I

## 2015-01-17 MED ORDER — SODIUM CHLORIDE 0.9 % IV BOLUS (SEPSIS)
1000.0000 mL | Freq: Once | INTRAVENOUS | Status: AC
  Administered 2015-01-17: 1000 mL via INTRAVENOUS

## 2015-01-17 NOTE — ED Notes (Signed)
Patient transported to X-ray 

## 2015-01-17 NOTE — ED Notes (Signed)
Pt has redness noted to coccyx area.

## 2015-01-17 NOTE — Progress Notes (Signed)
LCSW consulted with  SW team manager and will assess the needs of the patients.

## 2015-01-17 NOTE — ED Provider Notes (Signed)
[facility] Emergency Department Lori Phillips Note REMINDER - THIS NOTE IS NOT A FINAL MEDICAL RECORD UNTIL IT IS SIGNED. UNTIL THEN, THE CONTENT BELOW MAY REFLECT INFORMATION FROM A DOCUMENTATION TEMPLATE, NOT THE ACTUAL PATIENT VISIT. ____________________________________________  Time seen: Approximately 1:14 PM  I have reviewed the triage vital signs and the nursing notes.   HISTORY  Chief Complaint Weakness    HPI Lori Phillips is a 59 y.o. female with a long-standing history of multiple medical conditions including fibromyalgia, bipolar disorder, rheumatoid arthritis, kidney stones, Parkinson's disease, diabetes, coronary disease.  She reports that she went to bed last evening before midnight and was in her normal health able to walk using a walker when she woke up this morning she was unable to get herself out of bed, and her family called her reported that she sounded weak and tired and was unable to walk. The patient denies being in any pain. She's not had fevers or chills. She states that both legs feel very weak and she feels very tired. She does report a history of previous urinary tract infection as well. No nausea or vomiting. No chest pain or trouble breathing. No fevers or chills.  She reports no weakness in her arms, she was able to take her Parkinson medication just prior to coming to the ER.  She describes her Parkinson's as mild to moderate, primarily affecting her right arm and coordination. She denies any new speech change, facial droop, or numbness in arm or leg. She reports both legs feel very weak. No abdominal pain.  Past Medical History  Diagnosis Date  . CAD (coronary artery disease)     multiple caths  . Diabetes mellitus   . Hyperlipidemia   . Hypertension   . Thyroid disease     hypothyroid  . Low back pain   . Nephrolithiasis   . Fibromyalgia   . Bipolar disorder   . Anxiety   . Panic attacks   . Enlarged liver   .  Rheumatoid arthritis   . Enlarged pituitary gland   . Fibromyalgia   . Scoliosis   . Osteoporosis   . Osteoarthritis   . Kidney stones   . Hypothyroidism   . Allergy   . Parkinson's disease 2016    Patient Active Problem List   Diagnosis Date Noted  . Parkinsonism 12/14/2014  . Movement disorder 11/13/2014  . Urinary tract infection 10/13/2014  . Generalized weakness 10/13/2014  . DDD (degenerative disc disease), lumbar 09/28/2014  . Facet syndrome, lumbar 09/28/2014  . Thoracic facet syndrome 09/28/2014  . Sacroiliac joint disease 09/24/2014  . Intercostal neuralgia 08/29/2014  . Progressive focal motor weakness 07/04/2014  . Abnormal drug screen 10/13/2013  . Routine general medical examination at a health care facility 08/26/2013  . Knee pain, left 08/26/2013  . Personal history of colonic polyps 10/03/2011  . Gastroparesis due to DM 10/01/2011  . Chronic pain syndrome 02/21/2010  . Hypothyroidism 11/26/2009  . Type 2 diabetes, uncontrolled, with gastroparesis 11/26/2009  . HYPERLIPIDEMIA 11/26/2009  . Bipolar disorder 11/26/2009  . Anxiety state 11/26/2009  . HYPERTENSION 11/26/2009  . CORONARY ARTERY DISEASE 11/26/2009  . LOW BACK PAIN 11/26/2009  . NEPHROLITHIASIS, HX OF 11/26/2009    Past Surgical History  Procedure Laterality Date  . Vaginal hysterectomy    . Tonsillectomy    . Colonoscopy  multiple    2005 [place] records not available-polyps x 3  . Cardiac catheterization      x3  .  Esophageal dilation    . Kidney stone removal      Current Outpatient Rx  Name  Route  Sig  Dispense  Refill  . acetaminophen (TYLENOL) 650 MG CR tablet   Oral   Take 650 mg by mouth 3 (three) times daily as needed for pain.         . carbidopa-levodopa (SINEMET IR) 25-100 MG tablet   Oral   Take 1 tablet by mouth 3 (three) times daily.         . clonazePAM (KLONOPIN) 0.5 MG tablet   Oral   Take 1 tablet (0.5 mg total) by mouth daily as needed. Patient  taking differently: Take 0.5 mg by mouth daily as needed for anxiety.    30 tablet   0   . diazepam (VALIUM) 5 MG tablet   Oral   Take 2.5 mg by mouth at bedtime.          Marland Kitchen diltiazem (CARDIZEM CD) 180 MG 24 hr capsule   Oral   Take 180 mg by mouth daily.         . divalproex (DEPAKOTE ER) 500 MG 24 hr tablet   Oral   Take 1,000 mg by mouth daily.         Marland Kitchen gabapentin (NEURONTIN) 100 MG capsule   Oral   Take 100-200 mg by mouth 2 (two) times daily. Pt takes one every morning and two every evening.         . insulin aspart (NOVOLOG) 100 UNIT/ML injection   Subcutaneous   Inject 5-7 Units into the skin 2 (two) times daily. Pt uses 5 units in the morning and 7 units every evening.         Marland Kitchen levothyroxine (SYNTHROID, LEVOTHROID) 125 MCG tablet   Oral   Take 1 tablet (125 mcg total) by mouth daily.   60 tablet   1   . lisinopril (PRINIVIL,ZESTRIL) 5 MG tablet   Oral   Take 1 tablet (5 mg total) by mouth daily.   30 tablet   5   . metFORMIN (GLUCOPHAGE) 500 MG tablet   Oral   Take 500 mg by mouth 2 (two) times daily with a meal.         . nystatin cream (MYCOSTATIN)   Topical   Apply 1 application topically 2 (two) times daily as needed (for sore).         . Oxycodone HCl 10 MG TABS   Oral   Take 10 mg by mouth 3 (three) times daily as needed (for pain).         . pantoprazole (PROTONIX) 40 MG tablet   Oral   Take 1 tablet (40 mg total) by mouth daily.   90 tablet   0   . rosuvastatin (CRESTOR) 20 MG tablet   Oral   Take 1 tablet (20 mg total) by mouth daily.   30 tablet   0   . vitamin B-12 (CYANOCOBALAMIN) 500 MCG tablet   Oral   Take 500 mcg by mouth at bedtime.          Marland Kitchen amitriptyline (ELAVIL) 10 MG tablet   Oral   Take 1 tablet (10 mg total) by mouth at bedtime as needed. Patient not taking: Reported on 01/17/2015   30 tablet   0   . insulin lispro (HUMALOG) 100 UNIT/ML injection      Inject 5-7 Units into the skin 3 (three)  times daily before meals. Patient not  taking: Reported on 01/17/2015   10 mL   1     To replace Novolog.     Allergies Byetta 10 mcg pen; Glyburide; and Seroquel  Family History  Problem Relation Age of Onset  . Hypertension Mother   . Diabetes Mother   . Bipolar disorder Father   . Colon cancer Paternal Aunt   . Colon cancer Paternal Grandmother   . Thyroid disease Neg Hx   . Hypertension Father     Social History Social History  Substance Use Topics  . Smoking status: Never Smoker   . Smokeless tobacco: Never Used  . Alcohol Use: No    Review of Systems Constitutional: No fever/chills Eyes: No visual changes. ENT: No sore throat. Cardiovascular: Denies chest pain. Respiratory: Denies shortness of breath. Gastrointestinal: No abdominal pain.  No nausea, no vomiting.  No diarrhea.  No constipation. Genitourinary: Negative for dysuria. Musculoskeletal: Negative for back pain. Skin: Negative for rash. Neurological: Negative for headaches, focal weakness or numbness.  10-point ROS otherwise negative.  ____________________________________________   PHYSICAL EXAM:  VITAL SIGNS: ED Triage Vitals  Enc Vitals Group     BP 01/17/15 1311 114/53 mmHg     Pulse Rate 01/17/15 1311 62     Resp 01/17/15 1311 14     Temp 01/17/15 1311 97.7 F (36.5 C)     Temp Source 01/17/15 1311 Oral     SpO2 01/17/15 1311 94 %     Weight 01/17/15 1311 160 lb (72.576 kg)     Height 01/17/15 1311 5\' 4"  (1.626 m)     Head Cir --      [facility] Flow --      Pain Score 01/17/15 1313 0     Pain Loc --      Pain Edu? --      Excl. in [place]? --    Constitutional: Alert and oriented. Fatigued appearing but in no distress. Eyes: Conjunctivae are normal. PERRL. EOMI. Head: Atraumatic. Nose: No congestion/rhinnorhea. Mouth/Throat: Mucous membranes are moist.  Oropharynx non-erythematous. Neck: No stridor.   Cardiovascular: Normal rate, regular rhythm. Grossly normal heart sounds.  Good  peripheral circulation. Respiratory: Normal respiratory effort.  No retractions. Lungs CTAB. Gastrointestinal: Soft and nontender. No distention. No abdominal bruits. No CVA tenderness. Musculoskeletal: No lower extremity tenderness nor edema.  No joint effusions. Patient does have approximately 1 out of 5 strength of the lower legs bilaterally with normal sensation. She has no focal deficit. Neurologic:  Normal speech and language. No gross focal neurologic deficits are appreciated. 45 strength in upper extremity bilaterally. No pronator drift.  Skin:  Skin is warm, dry and intact. No rash noted. Psychiatric: Mood and affect are normal. Speech and behavior are normal.  ____________________________________________   LABS (all labs ordered are listed, but only abnormal results are displayed)  Labs Reviewed  CBC - Abnormal; Notable for the following:    Platelets 147 (*)    All other components within normal limits  COMPREHENSIVE METABOLIC PANEL - Abnormal; Notable for the following:    Potassium 3.3 (*)    Glucose, Bld 131 (*)    Creatinine, Ser 1.12 (*)    ALT <5 (*)    GFR calc non Af Amer 52 (*)    All other components within normal limits  URINALYSIS COMPLETEWITH MICROSCOPIC ([facility] ONLY) - Abnormal; Notable for the following:    Color, Urine YELLOW (*)    APPearance CLEAR (*)    Ketones, ur TRACE (*)  Hgb urine dipstick 1+ (*)    Protein, ur 30 (*)    Squamous Epithelial / LPF 0-5 (*)    All other components within normal limits  TROPONIN I  TROPONIN I   ____________________________________________  EKG  Reviewed and interpreted by me Normal sinus rhythm Ventricular rate 70 EKG time 1305 There is T-wave inversion noted in lateral precordial and limb leads, QTc is normal. PR normal. QRS 90. ____________________________________________  RADIOLOGY  Final result by Rad Results In Interface (01/17/15 16:12:00)   Narrative:   CLINICAL DATA: Weakness, short of  breath, Parkinson's disease.  EXAM: CHEST 2 VIEW  COMPARISON: 10/12/2014  FINDINGS: Normal cardiac silhouette. Lungs are hyperinflated. Chronic bronchitic markings. No effusion, infiltrate, or pneumothorax.  IMPRESSION: Chronic bronchitic markings. No acute findings.   Electronically Signed By: Suzy Bouchard M.D.    ____________________________________________   PROCEDURES  Procedure(s) performed: None  Critical Care performed: No  ____________________________________________   INITIAL IMPRESSION / ASSESSMENT AND PLAN / ED COURSE  Pertinent labs & imaging results that were available during my care of the patient were reviewed by me and considered in my medical decision making (see chart for details).  Patient presents with rather abrupt onset of generalized weakness worse in the lower extremities. She does not exhibit any obvious focal neurologic deficit, but given the lethargy that she reports and lower extremity weakness with a significant previous history we'll obtain a CT of the head to evaluate for acute abnormality. We'll check electrolytes, EKG, hydrate, and check urinalysis.  EKG does not demonstrate acute ST elevation but T-wave inversions are now much more pronounced, and there is new T-wave inversion in lateral precordial leads. Patient is not having any chest pain or acute chest symptoms, will check troponin.  ----------------------------------------- 2:59 PM on 01/17/2015 -----------------------------------------  The patient's daughter has arrived and reports to me that the patient has had recent rehabilitation stay at [facility] and that she has had weakness in her legs for some time, but does ambulate with a walker. She's noticed a somewhat progressive decline and their primary care doctor and been contacted to get physical therapy involved. In discussion with the daughter it seems that the patient's acute immobility symptoms are actually much  more long-standing and the patient was able to recall. We will continue to wait for urinalysis, the patient has no focal abnormalities, her lab work is very reassuring. I suspect this may be somewhat chronic deconditioning, and of consult to clinical social work to assist with disposition planning. Patient's daughter does indicate that they would likely be comfortable taking the patient home and working with her primary care doctor whom is well aware of these issues if disposition to a rehabilitation type center is not available pending not into be admitted.  ----------------------------------------- 3:33 PM on 01/17/2015 -----------------------------------------  Patient reports that she feels okay. In further discussion with her, she relates that her weakness in her legs has been long-standing and this did not occur suddenly. She is awake alert and in no distress. I wait clinical social work consultation. If we are unable to place the patient she is comfortable going home with family and working with her primary care doctor for placement in rehabilitation/physical therapy.  ----------------------------------------- 7:06 PM on 01/17/2015 -----------------------------------------  Patient stable. Drinking fluids, no distress. She does have some very minimal redness over the coccyx likely slight ulcer which is starting to develop from her immobility. I discussed close return precautions with the patient and her daughter. They did not wish  to be placed directly from the ER to the available nursing facility today, but will follow-up with her primary care doctor tomorrow. I have placed an ambulatory referral to physical therapy for them, and we have discussed close return precautions. ____________________________________________   FINAL CLINICAL IMPRESSION(S) / ED DIAGNOSES  Final diagnoses:  Weakness of both legs  Parkinsonism      Delman Kitten, MD 01/17/15 1907

## 2015-01-17 NOTE — Discharge Instructions (Signed)
Please follow-up with Dr. Tanja Port in the next 1-2 days. Please call tomorrow to set up evaluation.   Return to the emergency room right away if you develop any chest pain, sudden numbness or weakness in an arm or leg, sudden trouble speaking, develop fevers chills, worsening weakness or other new concerns arise.

## 2015-01-17 NOTE — Progress Notes (Signed)
LCSW met with Lori Phillips- and daughter [name]- We have a copy of pt HCPOA  And POA.  Patient was medically cleared and able to return to her home.  EDP Dr will write an order for [facility] [phone] care for PT. Patient and daughters wish is for her to remain at home they did not want patient to return to [facility].  They have applied for medicaid and have a DSS [place] worker Riley Churches [phone] As pt owns her own house and car- she may not qualify for long term care.  Daughter wishes to have her placed closer to her in [place] in the future but feels Mom will do better in their care. Other contacts: Primary Steve Rattler [phone]

## 2015-01-17 NOTE — ED Notes (Signed)
EMS states that pt was lethargic this morning and unable to ambulate. Pt states she is normally able to walk with a walker but when she woke up this morning she couldn't move her legs. Pt is alert and oriented x 4 upon arrival to ER. Pt able to move all extremities but weakness noted in bilateral lower extremities. Pt appears drowsy.

## 2015-01-18 ENCOUNTER — Telehealth: Payer: Self-pay

## 2015-01-18 ENCOUNTER — Telehealth: Payer: Self-pay | Admitting: Neurology

## 2015-01-18 DIAGNOSIS — M0569 Rheumatoid arthritis of multiple sites with involvement of other organs and systems: Secondary | ICD-10-CM | POA: Diagnosis not present

## 2015-01-18 DIAGNOSIS — E1121 Type 2 diabetes mellitus with diabetic nephropathy: Secondary | ICD-10-CM | POA: Diagnosis not present

## 2015-01-18 DIAGNOSIS — G20 Parkinson's disease: Secondary | ICD-10-CM | POA: Diagnosis not present

## 2015-01-18 DIAGNOSIS — I10 Essential (primary) hypertension: Secondary | ICD-10-CM | POA: Diagnosis not present

## 2015-01-18 DIAGNOSIS — F3160 Bipolar disorder, current episode mixed, unspecified: Secondary | ICD-10-CM | POA: Diagnosis not present

## 2015-01-18 DIAGNOSIS — I2510 Atherosclerotic heart disease of native coronary artery without angina pectoris: Secondary | ICD-10-CM | POA: Diagnosis not present

## 2015-01-18 NOTE — Telephone Encounter (Signed)
Pt's home health RN [facility]) called stating pt went to ED 01/17/15 with decreased mobility and thinks she needs to see neurologist. She is concerned she could have lymes disease as pt was bitten by a tick a couple of weeks ago. Pt lives with a lot of cats and RN thinks pt has been bitten more than she realizes. I tried to get pt in next week but the time available was 7:30 am and pt did not want to come that early. Pt will be seeing PCP tomorrow. Please call and advise. Patient can be reached at [phone].

## 2015-01-18 NOTE — Telephone Encounter (Signed)
[name] pts daughter left v/m (No DPR signed) wanting to know if [facility] has called for PT orders.  Do not see call from [facility] about PT orders: did not call [name] back due to no DPR. FYI to Dr Deborra Medina.

## 2015-01-19 ENCOUNTER — Ambulatory Visit (INDEPENDENT_AMBULATORY_CARE_PROVIDER_SITE_OTHER): Payer: Medicare Other | Admitting: Internal Medicine

## 2015-01-19 ENCOUNTER — Ambulatory Visit: Payer: Medicare Other | Admitting: Primary Care

## 2015-01-19 ENCOUNTER — Ambulatory Visit: Payer: Medicare Other | Admitting: Internal Medicine

## 2015-01-19 ENCOUNTER — Encounter: Payer: Self-pay | Admitting: Internal Medicine

## 2015-01-19 VITALS — BP 120/80 | HR 56 | Temp 98.6°F

## 2015-01-19 DIAGNOSIS — R531 Weakness: Secondary | ICD-10-CM | POA: Diagnosis not present

## 2015-01-19 NOTE — Progress Notes (Signed)
Subjective:    Patient ID: Lori Phillips, female    DOB: [birth year], 59 y.o.   MRN: [id number]  HPI Here with daughter [name] and grandson  She went to ER 2 days ago--- "I couldn't move my legs" Did improve while there and tests were negative Yesterday she did okay--then slurring words more last night and now very weak again Can stand but very slow Unable to walk--daughter had to support her  Lives with youngest daughter and others Rarely alone  Despite all that has gone on--never had this type of severity Had come out of rehab at [facility] (8/12) and almost ready to give up the walker  Bipolar disorder has been controlled Was taken off the abilify about 10 days ago--- in case that was causing the Parkinsonism No recent mania and not depressed Dr Karalee Height is the psychiatrist ([facility])  Had tick bite a couple of weeks ago May have been on for a day No rash  No fever ?slight facial droop in ER 2 days ago  Current Outpatient Prescriptions on File Prior to Visit  Medication Sig Dispense Refill  . acetaminophen (TYLENOL) 650 MG CR tablet Take 650 mg by mouth 3 (three) times daily as needed for pain.    . carbidopa-levodopa (SINEMET IR) 25-100 MG tablet Take 1 tablet by mouth 3 (three) times daily.    . clonazePAM (KLONOPIN) 0.5 MG tablet Take 1 tablet (0.5 mg total) by mouth daily as needed. (Patient taking differently: Take 0.5 mg by mouth daily as needed for anxiety. ) 30 tablet 0  . diazepam (VALIUM) 5 MG tablet Take 2.5 mg by mouth at bedtime.     Marland Kitchen diltiazem (CARDIZEM CD) 180 MG 24 hr capsule Take 180 mg by mouth daily.    . divalproex (DEPAKOTE ER) 500 MG 24 hr tablet Take 1,000 mg by mouth daily.    Marland Kitchen gabapentin (NEURONTIN) 100 MG capsule Take 100-200 mg by mouth 2 (two) times daily. Pt takes one every morning and two every evening.    . insulin aspart (NOVOLOG) 100 UNIT/ML injection Inject 5-7 Units into the skin 2 (two) times daily. Pt uses 5 units in the morning  and 7 units every evening.    Marland Kitchen levothyroxine (SYNTHROID, LEVOTHROID) 125 MCG tablet Take 1 tablet (125 mcg total) by mouth daily. 60 tablet 1  . lisinopril (PRINIVIL,ZESTRIL) 5 MG tablet Take 1 tablet (5 mg total) by mouth daily. 30 tablet 5  . metFORMIN (GLUCOPHAGE) 500 MG tablet Take 500 mg by mouth 2 (two) times daily with a meal.    . nystatin cream (MYCOSTATIN) Apply 1 application topically 2 (two) times daily as needed (for sore).    . Oxycodone HCl 10 MG TABS Take 10 mg by mouth 3 (three) times daily as needed (for pain).    . pantoprazole (PROTONIX) 40 MG tablet Take 1 tablet (40 mg total) by mouth daily. 90 tablet 0  . rosuvastatin (CRESTOR) 20 MG tablet Take 1 tablet (20 mg total) by mouth daily. 30 tablet 0  . vitamin B-12 (CYANOCOBALAMIN) 500 MCG tablet Take 500 mcg by mouth at bedtime.     . [DISCONTINUED] TAZTIA XT 180 MG 24 hr capsule TAKE ONE CAPSULE BY MOUTH EVERY DAY 90 capsule 0   No current facility-administered medications on file prior to visit.    Allergies  Allergen Reactions  . Byetta 10 Mcg Pen [Exenatide] Nausea And Vomiting  . Glyburide Other (See Comments)    Reaction:  Dizziness  and confusion   . Seroquel [Quetiapine Fumarate] Diarrhea, Nausea And Vomiting and Other (See Comments)    Reaction:  Confusion     Past Medical History  Diagnosis Date  . CAD (coronary artery disease)     multiple caths  . Diabetes mellitus   . Hyperlipidemia   . Hypertension   . Thyroid disease     hypothyroid  . Low back pain   . Nephrolithiasis   . Fibromyalgia   . Bipolar disorder   . Anxiety   . Panic attacks   . Enlarged liver   . Rheumatoid arthritis   . Enlarged pituitary gland   . Fibromyalgia   . Scoliosis   . Osteoporosis   . Osteoarthritis   . Kidney stones   . Hypothyroidism   . Allergy   . Parkinson's disease 2016    Past Surgical History  Procedure Laterality Date  . Vaginal hysterectomy    . Tonsillectomy    . Colonoscopy  multiple    2005  [place] records not available-polyps x 3  . Cardiac catheterization      x3  . Esophageal dilation    . Kidney stone removal      Family History  Problem Relation Age of Onset  . Hypertension Mother   . Diabetes Mother   . Bipolar disorder Father   . Colon cancer Paternal Aunt   . Colon cancer Paternal Grandmother   . Thyroid disease Neg Hx   . Hypertension Father     Social History   Social History  . Marital Status: Single    Spouse Name: N/A  . Number of Children: 3  . Years of Education: 12   Occupational History  . disability  Other   Social History Main Topics  . Smoking status: Never Smoker   . Smokeless tobacco: Never Used  . Alcohol Use: No  . Drug Use: No  . Sexual Activity: Not on file   Other Topics Concern  . Not on file   Social History Narrative   Has a living will- desires CPR but no prolonged life support if futile.   Caffeine use: Drinks 2 L soda per day   Drinks 2 glasses tea/day   Review of Systems Appetite "not as good as I should" Sleeps okay No cough or SOB Chronic pain--no real change. Mostly in back and legs    Objective:   Physical Exam  Constitutional: No distress.  Neck: No thyromegaly present.  Cardiovascular: Normal rate, regular rhythm and normal heart sounds.  Exam reveals no gallop.   No murmur heard. Pulmonary/Chest: Effort normal and breath sounds normal. No respiratory distress. She has no wheezes. She has no rales.  Musculoskeletal: She exhibits no edema.  Lymphadenopathy:    She has no cervical adenopathy.  Neurological:  Slumped over on rollator Able to straighten up 75% only for short time Face symmetric 3+-4/5 strength in arms 3/5 right leg, 3+/5 left leg Significant bradykinesia  Skin:  2-63mm nodule on right arm at tick site ---no inflammation          Assessment & Plan:

## 2015-01-19 NOTE — Progress Notes (Signed)
Pre visit review using our clinic review tool, if applicable. No additional management support is needed unless otherwise documented below in the visit note. 

## 2015-01-19 NOTE — Assessment & Plan Note (Addendum)
This has been a recurrent problem--- but this is the worst ever (similar to 2 days ago in ER) I don't think there is an acute infection of any type (certainly no reason to consider Lyme disease as nurse had suggested) May have had regular clonazepam and narcotic (instead of prn) --but this would be expected to cause more sedation than weakness Had flu shot 4 days ago--doubt any neurologic reaction to that  Could be conversion reaction with her psychiatric diagnosis I don't think stopping the abilify would cause this--and I recommend staying off it for now She needs 24/7 care to be at home---or needs skilled care again She needs to see Dr Jaynee Eagles ASAP to reevaluate Note to other doctors as well

## 2015-01-22 NOTE — Telephone Encounter (Signed)
I am out of the office this week, patient should see primary care asap and follow up with me next week if needed.

## 2015-01-22 NOTE — Telephone Encounter (Signed)
Left VM to advise that Dr. Jaynee Eagles is not in the office this week and she needs to f/u with her PCP asap if she has not already. Asked her to call back to set up f/u if needed with Dr. Jaynee Eagles next week. Gave [facility] phone number. Advised Dr. Jaynee Eagles and I were not in the office on Friday.

## 2015-01-23 NOTE — Telephone Encounter (Signed)
Dr Jaynee Eagles-- Odetta Pink in the phone staff spoke to pt and advised for her to see PCP asap per Dr. Jaynee Eagles. Pt stated saw Dr. Silvio Pate 1-2 after ER visit and he was going to send a letter to Dr. Jaynee Eagles to have pt be seen sooner. Pt stated they discovered she was mixing medications and physical therapy was coming to work with her. A f/u appt was made with Dr. Jaynee Eagles on 10/14 at 930am for check in at 915am.

## 2015-01-24 NOTE — Telephone Encounter (Signed)
Thank you. Please ensure she has 30 minutes.

## 2015-01-26 DIAGNOSIS — F3160 Bipolar disorder, current episode mixed, unspecified: Secondary | ICD-10-CM | POA: Diagnosis not present

## 2015-01-26 DIAGNOSIS — G20 Parkinson's disease: Secondary | ICD-10-CM | POA: Diagnosis not present

## 2015-01-26 DIAGNOSIS — M0569 Rheumatoid arthritis of multiple sites with involvement of other organs and systems: Secondary | ICD-10-CM | POA: Diagnosis not present

## 2015-01-26 DIAGNOSIS — I10 Essential (primary) hypertension: Secondary | ICD-10-CM | POA: Diagnosis not present

## 2015-01-26 DIAGNOSIS — I2510 Atherosclerotic heart disease of native coronary artery without angina pectoris: Secondary | ICD-10-CM | POA: Diagnosis not present

## 2015-01-26 DIAGNOSIS — E1121 Type 2 diabetes mellitus with diabetic nephropathy: Secondary | ICD-10-CM | POA: Diagnosis not present

## 2015-01-30 ENCOUNTER — Encounter: Payer: Self-pay | Admitting: Pain Medicine

## 2015-01-30 ENCOUNTER — Telehealth: Payer: Self-pay

## 2015-01-30 ENCOUNTER — Ambulatory Visit: Payer: Medicare Other | Attending: Pain Medicine | Admitting: Pain Medicine

## 2015-01-30 VITALS — BP 108/63 | HR 76 | Temp 98.2°F | Resp 16 | Ht 64.0 in | Wt 156.0 lb

## 2015-01-30 DIAGNOSIS — M5416 Radiculopathy, lumbar region: Secondary | ICD-10-CM | POA: Diagnosis not present

## 2015-01-30 DIAGNOSIS — M47814 Spondylosis without myelopathy or radiculopathy, thoracic region: Secondary | ICD-10-CM

## 2015-01-30 DIAGNOSIS — M545 Low back pain: Secondary | ICD-10-CM | POA: Diagnosis present

## 2015-01-30 DIAGNOSIS — G588 Other specified mononeuropathies: Secondary | ICD-10-CM | POA: Insufficient documentation

## 2015-01-30 DIAGNOSIS — M79605 Pain in left leg: Secondary | ICD-10-CM | POA: Diagnosis present

## 2015-01-30 DIAGNOSIS — G20 Parkinson's disease: Secondary | ICD-10-CM | POA: Diagnosis not present

## 2015-01-30 DIAGNOSIS — M5134 Other intervertebral disc degeneration, thoracic region: Secondary | ICD-10-CM | POA: Insufficient documentation

## 2015-01-30 DIAGNOSIS — M79604 Pain in right leg: Secondary | ICD-10-CM | POA: Diagnosis present

## 2015-01-30 DIAGNOSIS — M5136 Other intervertebral disc degeneration, lumbar region: Secondary | ICD-10-CM | POA: Diagnosis not present

## 2015-01-30 DIAGNOSIS — M47894 Other spondylosis, thoracic region: Secondary | ICD-10-CM

## 2015-01-30 DIAGNOSIS — M47816 Spondylosis without myelopathy or radiculopathy, lumbar region: Secondary | ICD-10-CM

## 2015-01-30 DIAGNOSIS — M461 Sacroiliitis, not elsewhere classified: Secondary | ICD-10-CM

## 2015-01-30 DIAGNOSIS — M533 Sacrococcygeal disorders, not elsewhere classified: Secondary | ICD-10-CM | POA: Insufficient documentation

## 2015-01-30 DIAGNOSIS — M4184 Other forms of scoliosis, thoracic region: Secondary | ICD-10-CM | POA: Diagnosis not present

## 2015-01-30 DIAGNOSIS — M792 Neuralgia and neuritis, unspecified: Secondary | ICD-10-CM | POA: Diagnosis not present

## 2015-01-30 DIAGNOSIS — M47818 Spondylosis without myelopathy or radiculopathy, sacral and sacrococcygeal region: Secondary | ICD-10-CM

## 2015-01-30 DIAGNOSIS — M47817 Spondylosis without myelopathy or radiculopathy, lumbosacral region: Secondary | ICD-10-CM | POA: Diagnosis not present

## 2015-01-30 NOTE — Progress Notes (Signed)
Safety precautions to be maintained throughout the outpatient stay will include: orient to surroundings, keep bed in low position, maintain call bell within reach at all times, provide assistance with transfer out of bed and ambulation. Discharge instructions reviewed with daughter and she will call to make an appoinment to come back to this clinic after going to see PCP.Daughter was told patient referred to tertiory center pain clinic for evaluation and the treatment for her pain and patient Should  Continue  Her neurological/tx for Parkinsons with [facility] neurolog[facility] and have psych eval with Dr Jacqualine Code. Patient to also see  Her PCP.

## 2015-01-30 NOTE — Telephone Encounter (Signed)
Dispensing optician (DPR signed) left v/m; pt was seen by Dr Primus Bravo today and Dr Primus Bravo did not fill pain med; Amber request cb, pt has not had pain med in 2 months. Amber wants to know if Dr Deborra Medina will prescribe pain med until get pt established with another pain mgt doctor.pt last saw Dr Deborra Medina 12/14/14.Please advise.

## 2015-01-30 NOTE — Patient Instructions (Addendum)
PLAN   Continue present medication  F/U PCP Dr.Aron  for evaliation of  BP and general medical  condition. Discuss your returning to tertiary center for evaluation of your pain with Dr.Aron  F/U surgical evaluation. May consider pending follow-up evaluations  F/U neurological evaluation with [facility] as discussed  F/U psych evaluation with Dr. Jacqualine Code as discussed  May consider radiofrequency rhizolysis or intraspinal procedures pending response to present treatment and F/U evaluation   Patient to call Pain Management Center should patient have concerns prior to scheduled return appointment.

## 2015-01-30 NOTE — Progress Notes (Signed)
Subjective:    Patient ID: Lori Phillips, female    DOB: Aug 19, 1955, 59 y.o.   MRN: [id number]  HPI  Patient is 59 year old female returns to pain management for further evaluation and treatment of pain involving the lower back lower extremity region with pain of the mid back region of lesser degree. His condition on today's visit. Patient has been attending rehabilitation and has been in therapy for retraining of patient's ability to ambulate. There is concern regarding patient's Parkinson's contributing to a significant portion of patient's condition. We discussed patient's condition on today's visit and have recommended that patient return to her primary care physician Dr. Deborra Medina to discuss referral to a tertiary pain clinic for recommendations regarding patient's treatment. At the present time we plan to avoid interventional treatment a modification of patient's treatment regimen and have requested that patient undergo evaluation at tertiary pain clinic. Pending evaluation at a tertiary pain clinic we will consider following recommendations of the tertiary pain clinic and proceed with treatment of patient as recommended. The patient was with understanding and in agreement status treatment plan. The patient will continue the care of Dr. Jacqualine Code at this time and [facility] in addition to continue the care of her primary care physician     Review of Systems     Objective:   Physical Exam  There was tenderness over the splenius capitis and occipitalis musculature region of mild degree. There was mild tenderness of the cervical facet cervical paraspinal musculature region. Palpation of the acromioclavicular and glenohumeral joint regions reproduced mild discomfort. Patient appeared to be with slightly decreased grip strength. Tinel and Phalen's maneuver were without increase of pain significant degree. There was tenderness to palpation of the region of the thoracic facet thoracic  paraspinal muscles region of moderate degree. Palpation over the region of the lumbar paraspinal muscles lumbar facet region associated with moderate discomfort. Lateral bending and rotation and extension and palpation of the lumbar facets reproduce moderate discomfort. Straight leg raising was tolerates approximately 20 without increased pain noted with dorsiflexion noted EHL strength appeared to be decreased. No definite sensory deficit of dermatomal diffuse detected. There was moderate tenderness to palpation of the PSIS and PII S regions as well as the gluteal and piriformis musculature region and the greater trochanteric region . Abdomen was nontender with no costovertebral tenderness noted.     Assessment & Plan:    Degenerative disc disease thoracic spine T7-T8 degenerative disc disease, spondylitic scoliosis and mild to moderate eccentric impingement at T7-8 and borderline left eccentric impingement T 1112, T12-L1, probable cyst of the kidney.  Degenerative disc disease lumbar spine spondylosis multilevel involvement with mild left-sided impingement at L4-5 Lumbar facet syndrome  Intercostal neuralgia  Sacroiliac joint dysfunction  Parkinson's    PLAN   Continue present medication. At this time we plan to avoid prescribing medications for treatment of patient's pain and will await recommendations of tertiary pain clinic prior to prescribing medications for treatment of patient's pain Dr. Deborra Medina  F/U PCP for evaliation of  BP and general medical  Condition and to discuss referral to tertiary pain clinic for further recommendations regarding patient's treatment regimen  F/U surgical evaluation.   F/U neurological evaluation with [facility] as discussed  F/U psych evaluation with Dr. Jacqualine Code as discussed  May consider radiofrequency rhizolysis or intraspinal procedures pending response to present treatment and F/U evaluation . At the present time we will avoid  considering interventional treatment and will await recommendations of tertiary  pain clinic  Patient to call Pain Management Center should patient have concerns prior to scheduled return appointment.

## 2015-01-30 NOTE — Telephone Encounter (Signed)
No I unfortunately do not manage chronic pain.  Please request records from today's visit with Dr. Primus Bravo.

## 2015-01-31 DIAGNOSIS — M0569 Rheumatoid arthritis of multiple sites with involvement of other organs and systems: Secondary | ICD-10-CM | POA: Diagnosis not present

## 2015-01-31 DIAGNOSIS — F3160 Bipolar disorder, current episode mixed, unspecified: Secondary | ICD-10-CM | POA: Diagnosis not present

## 2015-01-31 DIAGNOSIS — I10 Essential (primary) hypertension: Secondary | ICD-10-CM | POA: Diagnosis not present

## 2015-01-31 DIAGNOSIS — E1121 Type 2 diabetes mellitus with diabetic nephropathy: Secondary | ICD-10-CM | POA: Diagnosis not present

## 2015-01-31 DIAGNOSIS — G20 Parkinson's disease: Secondary | ICD-10-CM | POA: Diagnosis not present

## 2015-01-31 DIAGNOSIS — I2510 Atherosclerotic heart disease of native coronary artery without angina pectoris: Secondary | ICD-10-CM | POA: Diagnosis not present

## 2015-01-31 NOTE — Telephone Encounter (Signed)
Spoke to pts daughter and advised per Dr Deborra Medina. Records requested from Dr Primus Bravo

## 2015-01-31 NOTE — Telephone Encounter (Signed)
Per Dr Milana Na office, they are on Epic. Note printed and placed in Dr Hulen Shouts inbox for review

## 2015-02-02 ENCOUNTER — Ambulatory Visit: Payer: Medicare Other | Admitting: Neurology

## 2015-02-05 ENCOUNTER — Telehealth: Payer: Self-pay | Admitting: Family Medicine

## 2015-02-05 DIAGNOSIS — M0569 Rheumatoid arthritis of multiple sites with involvement of other organs and systems: Secondary | ICD-10-CM | POA: Diagnosis not present

## 2015-02-05 DIAGNOSIS — G20 Parkinson's disease: Secondary | ICD-10-CM | POA: Diagnosis not present

## 2015-02-05 DIAGNOSIS — I10 Essential (primary) hypertension: Secondary | ICD-10-CM | POA: Diagnosis not present

## 2015-02-05 DIAGNOSIS — E1121 Type 2 diabetes mellitus with diabetic nephropathy: Secondary | ICD-10-CM | POA: Diagnosis not present

## 2015-02-05 DIAGNOSIS — Z9181 History of falling: Secondary | ICD-10-CM | POA: Diagnosis not present

## 2015-02-05 DIAGNOSIS — I2510 Atherosclerotic heart disease of native coronary artery without angina pectoris: Secondary | ICD-10-CM | POA: Diagnosis not present

## 2015-02-05 DIAGNOSIS — F3160 Bipolar disorder, current episode mixed, unspecified: Secondary | ICD-10-CM | POA: Diagnosis not present

## 2015-02-05 NOTE — Telephone Encounter (Signed)
Pt daughter [name] called stated her employer will be sending FMLA papers to be completed. Amber stated when pt was in office in Sept 2016, Dr. Silvio Pate stated she needed to be under 24 hour care. Amber will have FMLA forms faxed to our office.

## 2015-02-06 ENCOUNTER — Telehealth: Payer: Self-pay | Admitting: Family Medicine

## 2015-02-06 DIAGNOSIS — G20 Parkinson's disease: Secondary | ICD-10-CM | POA: Diagnosis not present

## 2015-02-06 DIAGNOSIS — Z9181 History of falling: Secondary | ICD-10-CM | POA: Diagnosis not present

## 2015-02-06 DIAGNOSIS — I10 Essential (primary) hypertension: Secondary | ICD-10-CM | POA: Diagnosis not present

## 2015-02-06 DIAGNOSIS — F3160 Bipolar disorder, current episode mixed, unspecified: Secondary | ICD-10-CM | POA: Diagnosis not present

## 2015-02-06 DIAGNOSIS — E1121 Type 2 diabetes mellitus with diabetic nephropathy: Secondary | ICD-10-CM | POA: Diagnosis not present

## 2015-02-06 DIAGNOSIS — M0569 Rheumatoid arthritis of multiple sites with involvement of other organs and systems: Secondary | ICD-10-CM | POA: Diagnosis not present

## 2015-02-06 DIAGNOSIS — I2510 Atherosclerotic heart disease of native coronary artery without angina pectoris: Secondary | ICD-10-CM | POA: Diagnosis not present

## 2015-02-06 NOTE — Telephone Encounter (Addendum)
Asolinn w/[facility] needs a verbal okay for physical therapy twice a week for four weeks.  She also needs a verbal for a home health aid to the house for help bathing.  She also needs Occupational Therapy out to do an evaluation.

## 2015-02-06 NOTE — Telephone Encounter (Signed)
Ok to give all verbal orders as requested.

## 2015-02-06 NOTE — Telephone Encounter (Signed)
Please advise 

## 2015-02-06 NOTE — Telephone Encounter (Signed)
Notified Asolinn that verbal orders were given.

## 2015-02-07 NOTE — Telephone Encounter (Signed)
fmla paperwork  In Dr Hulen Shouts IN BOX For review and signature

## 2015-02-08 DIAGNOSIS — Z9181 History of falling: Secondary | ICD-10-CM | POA: Diagnosis not present

## 2015-02-08 DIAGNOSIS — I2510 Atherosclerotic heart disease of native coronary artery without angina pectoris: Secondary | ICD-10-CM | POA: Diagnosis not present

## 2015-02-08 DIAGNOSIS — G20 Parkinson's disease: Secondary | ICD-10-CM | POA: Diagnosis not present

## 2015-02-08 DIAGNOSIS — I10 Essential (primary) hypertension: Secondary | ICD-10-CM | POA: Diagnosis not present

## 2015-02-08 DIAGNOSIS — M0569 Rheumatoid arthritis of multiple sites with involvement of other organs and systems: Secondary | ICD-10-CM | POA: Diagnosis not present

## 2015-02-08 DIAGNOSIS — E1121 Type 2 diabetes mellitus with diabetic nephropathy: Secondary | ICD-10-CM | POA: Diagnosis not present

## 2015-02-08 DIAGNOSIS — F3160 Bipolar disorder, current episode mixed, unspecified: Secondary | ICD-10-CM | POA: Diagnosis not present

## 2015-02-08 DIAGNOSIS — Z0279 Encounter for issue of other medical certificate: Secondary | ICD-10-CM

## 2015-02-09 DIAGNOSIS — I10 Essential (primary) hypertension: Secondary | ICD-10-CM | POA: Diagnosis not present

## 2015-02-09 DIAGNOSIS — F3160 Bipolar disorder, current episode mixed, unspecified: Secondary | ICD-10-CM | POA: Diagnosis not present

## 2015-02-09 DIAGNOSIS — I2510 Atherosclerotic heart disease of native coronary artery without angina pectoris: Secondary | ICD-10-CM | POA: Diagnosis not present

## 2015-02-09 DIAGNOSIS — G20 Parkinson's disease: Secondary | ICD-10-CM | POA: Diagnosis not present

## 2015-02-09 DIAGNOSIS — E1121 Type 2 diabetes mellitus with diabetic nephropathy: Secondary | ICD-10-CM | POA: Diagnosis not present

## 2015-02-09 DIAGNOSIS — M0569 Rheumatoid arthritis of multiple sites with involvement of other organs and systems: Secondary | ICD-10-CM | POA: Diagnosis not present

## 2015-02-09 DIAGNOSIS — Z9181 History of falling: Secondary | ICD-10-CM | POA: Diagnosis not present

## 2015-02-09 NOTE — Telephone Encounter (Signed)
Pt aware fmla paperwork faxed\  Copy for pt Copy for billing Copy for scan Copy for file

## 2015-02-12 ENCOUNTER — Telehealth: Payer: Self-pay | Admitting: *Deleted

## 2015-02-12 DIAGNOSIS — M0569 Rheumatoid arthritis of multiple sites with involvement of other organs and systems: Secondary | ICD-10-CM | POA: Diagnosis not present

## 2015-02-12 DIAGNOSIS — Z9181 History of falling: Secondary | ICD-10-CM | POA: Diagnosis not present

## 2015-02-12 DIAGNOSIS — E1121 Type 2 diabetes mellitus with diabetic nephropathy: Secondary | ICD-10-CM | POA: Diagnosis not present

## 2015-02-12 DIAGNOSIS — G20 Parkinson's disease: Secondary | ICD-10-CM | POA: Diagnosis not present

## 2015-02-12 DIAGNOSIS — F3160 Bipolar disorder, current episode mixed, unspecified: Secondary | ICD-10-CM | POA: Diagnosis not present

## 2015-02-12 DIAGNOSIS — I10 Essential (primary) hypertension: Secondary | ICD-10-CM | POA: Diagnosis not present

## 2015-02-12 DIAGNOSIS — I2510 Atherosclerotic heart disease of native coronary artery without angina pectoris: Secondary | ICD-10-CM | POA: Diagnosis not present

## 2015-02-12 NOTE — Telephone Encounter (Signed)
Initially, try imodium but if the diarrhea persists, she needs to be seen.  Any other symptoms- fever, vomiting, blood in stool, abdominal pain?

## 2015-02-12 NOTE — Telephone Encounter (Signed)
Pt calling asking for something for diarrhea, pt states right after she eats she has bad diarrhea, please advise

## 2015-02-13 DIAGNOSIS — F3160 Bipolar disorder, current episode mixed, unspecified: Secondary | ICD-10-CM | POA: Diagnosis not present

## 2015-02-13 DIAGNOSIS — E1121 Type 2 diabetes mellitus with diabetic nephropathy: Secondary | ICD-10-CM | POA: Diagnosis not present

## 2015-02-13 DIAGNOSIS — M0569 Rheumatoid arthritis of multiple sites with involvement of other organs and systems: Secondary | ICD-10-CM | POA: Diagnosis not present

## 2015-02-13 DIAGNOSIS — G20 Parkinson's disease: Secondary | ICD-10-CM | POA: Diagnosis not present

## 2015-02-13 DIAGNOSIS — I10 Essential (primary) hypertension: Secondary | ICD-10-CM | POA: Diagnosis not present

## 2015-02-13 DIAGNOSIS — Z9181 History of falling: Secondary | ICD-10-CM | POA: Diagnosis not present

## 2015-02-13 DIAGNOSIS — I2510 Atherosclerotic heart disease of native coronary artery without angina pectoris: Secondary | ICD-10-CM | POA: Diagnosis not present

## 2015-02-13 NOTE — Telephone Encounter (Signed)
Lm on pts vm requesting a call back 

## 2015-02-13 NOTE — Telephone Encounter (Signed)
Spoke to pt who states this has been continuously for months. She has been taking protonix with no relief. Advised per Dr Deborra Medina, and pt verbally expressed understanding. If s/s do not improve, she will contact office back to schedule OV

## 2015-02-15 ENCOUNTER — Telehealth: Payer: Self-pay | Admitting: *Deleted

## 2015-02-15 DIAGNOSIS — G20 Parkinson's disease: Secondary | ICD-10-CM | POA: Diagnosis not present

## 2015-02-15 DIAGNOSIS — Z9181 History of falling: Secondary | ICD-10-CM | POA: Diagnosis not present

## 2015-02-15 DIAGNOSIS — M0569 Rheumatoid arthritis of multiple sites with involvement of other organs and systems: Secondary | ICD-10-CM | POA: Diagnosis not present

## 2015-02-15 DIAGNOSIS — I2510 Atherosclerotic heart disease of native coronary artery without angina pectoris: Secondary | ICD-10-CM | POA: Diagnosis not present

## 2015-02-15 DIAGNOSIS — E1121 Type 2 diabetes mellitus with diabetic nephropathy: Secondary | ICD-10-CM | POA: Diagnosis not present

## 2015-02-15 DIAGNOSIS — F3160 Bipolar disorder, current episode mixed, unspecified: Secondary | ICD-10-CM | POA: Diagnosis not present

## 2015-02-15 DIAGNOSIS — I10 Essential (primary) hypertension: Secondary | ICD-10-CM | POA: Diagnosis not present

## 2015-02-15 NOTE — Telephone Encounter (Signed)
Lori Phillips with [facility] left voicemail at Triage, she is requesting an order for a social worker eval to help with financial issues and community resources. [name] request orders to be faxed to [phone]  If we have any questions we can call her back at [phone]

## 2015-02-15 NOTE — Telephone Encounter (Signed)
Spoke to BJ and provided verbal orders

## 2015-02-15 NOTE — Telephone Encounter (Signed)
Ok to give verbal orders as requested. 

## 2015-02-16 ENCOUNTER — Ambulatory Visit: Payer: Medicare Other | Admitting: Neurology

## 2015-02-16 DIAGNOSIS — E1121 Type 2 diabetes mellitus with diabetic nephropathy: Secondary | ICD-10-CM | POA: Diagnosis not present

## 2015-02-16 DIAGNOSIS — G20 Parkinson's disease: Secondary | ICD-10-CM | POA: Diagnosis not present

## 2015-02-16 DIAGNOSIS — F3160 Bipolar disorder, current episode mixed, unspecified: Secondary | ICD-10-CM | POA: Diagnosis not present

## 2015-02-16 DIAGNOSIS — I10 Essential (primary) hypertension: Secondary | ICD-10-CM | POA: Diagnosis not present

## 2015-02-16 DIAGNOSIS — M0569 Rheumatoid arthritis of multiple sites with involvement of other organs and systems: Secondary | ICD-10-CM | POA: Diagnosis not present

## 2015-02-16 DIAGNOSIS — Z9181 History of falling: Secondary | ICD-10-CM | POA: Diagnosis not present

## 2015-02-16 DIAGNOSIS — I2510 Atherosclerotic heart disease of native coronary artery without angina pectoris: Secondary | ICD-10-CM | POA: Diagnosis not present

## 2015-02-19 DIAGNOSIS — E1121 Type 2 diabetes mellitus with diabetic nephropathy: Secondary | ICD-10-CM | POA: Diagnosis not present

## 2015-02-19 DIAGNOSIS — M0569 Rheumatoid arthritis of multiple sites with involvement of other organs and systems: Secondary | ICD-10-CM | POA: Diagnosis not present

## 2015-02-19 DIAGNOSIS — F3160 Bipolar disorder, current episode mixed, unspecified: Secondary | ICD-10-CM | POA: Diagnosis not present

## 2015-02-19 DIAGNOSIS — I10 Essential (primary) hypertension: Secondary | ICD-10-CM | POA: Diagnosis not present

## 2015-02-19 DIAGNOSIS — Z9181 History of falling: Secondary | ICD-10-CM | POA: Diagnosis not present

## 2015-02-19 DIAGNOSIS — I2510 Atherosclerotic heart disease of native coronary artery without angina pectoris: Secondary | ICD-10-CM | POA: Diagnosis not present

## 2015-02-19 DIAGNOSIS — G20 Parkinson's disease: Secondary | ICD-10-CM | POA: Diagnosis not present

## 2015-02-21 DIAGNOSIS — F3160 Bipolar disorder, current episode mixed, unspecified: Secondary | ICD-10-CM | POA: Diagnosis not present

## 2015-02-21 DIAGNOSIS — Z9181 History of falling: Secondary | ICD-10-CM | POA: Diagnosis not present

## 2015-02-21 DIAGNOSIS — I10 Essential (primary) hypertension: Secondary | ICD-10-CM | POA: Diagnosis not present

## 2015-02-21 DIAGNOSIS — E1121 Type 2 diabetes mellitus with diabetic nephropathy: Secondary | ICD-10-CM | POA: Diagnosis not present

## 2015-02-21 DIAGNOSIS — G20 Parkinson's disease: Secondary | ICD-10-CM | POA: Diagnosis not present

## 2015-02-21 DIAGNOSIS — M0569 Rheumatoid arthritis of multiple sites with involvement of other organs and systems: Secondary | ICD-10-CM | POA: Diagnosis not present

## 2015-02-21 DIAGNOSIS — I2510 Atherosclerotic heart disease of native coronary artery without angina pectoris: Secondary | ICD-10-CM | POA: Diagnosis not present

## 2015-02-21 NOTE — Telephone Encounter (Addendum)
Dawn nurse with [facility] left v/m requesting cb about social worker orders.

## 2015-02-22 ENCOUNTER — Telehealth: Payer: Self-pay | Admitting: *Deleted

## 2015-02-22 ENCOUNTER — Encounter: Payer: Self-pay | Admitting: Neurology

## 2015-02-22 ENCOUNTER — Ambulatory Visit (INDEPENDENT_AMBULATORY_CARE_PROVIDER_SITE_OTHER): Payer: Medicare Other | Admitting: Neurology

## 2015-02-22 VITALS — BP 116/64 | HR 62 | Ht 64.0 in | Wt 148.8 lb

## 2015-02-22 DIAGNOSIS — G20 Parkinson's disease: Secondary | ICD-10-CM | POA: Diagnosis not present

## 2015-02-22 DIAGNOSIS — M0569 Rheumatoid arthritis of multiple sites with involvement of other organs and systems: Secondary | ICD-10-CM | POA: Diagnosis not present

## 2015-02-22 DIAGNOSIS — I2510 Atherosclerotic heart disease of native coronary artery without angina pectoris: Secondary | ICD-10-CM | POA: Diagnosis not present

## 2015-02-22 DIAGNOSIS — E1121 Type 2 diabetes mellitus with diabetic nephropathy: Secondary | ICD-10-CM | POA: Diagnosis not present

## 2015-02-22 NOTE — Patient Instructions (Signed)
Overall you are doing fairly well but I do want to suggest a few things today:   Remember to drink plenty of fluid, eat healthy meals and do not skip any meals. Try to eat protein with a every meal and eat a healthy snack such as fruit or nuts in between meals. Try to keep a regular sleep-wake schedule and try to exercise daily, particularly in the form of walking, 20-30 minutes a day, if you can.   As far as your medications are concerned, I would like to suggest: Continue current medications  Dr. Cruzita Lederer Endocrinologist for TSH and diabetes.   As far as diagnostic testing: MRI of the brain  I would like to see you back in 4-6 months, sooner if we need to. Please call us with any interim questions, concerns, problems, updates or refill requests.   Please also call us for any test results so we can go over those with you on the phone.  My clinical assistant and will answer any of your questions and relay your messages to me and also relay most of my messages to you.   Our phone number is [phone]. We also have an after hours call service for urgent matters and there is a physician on-call for urgent questions. For any emergencies you know to call 911 or go to the nearest emergency room

## 2015-02-22 NOTE — Telephone Encounter (Signed)
Attempted to contact Dawn at new # provided but vm is not set up to leave message. Spoke to [place] on 10/27 and provided orders. Spoke with BJ again, today, who confirmed that the orders were received and were in the pts chart per [place].

## 2015-02-22 NOTE — Telephone Encounter (Signed)
Request faxed on 02/22/15 requesting results.

## 2015-02-22 NOTE — Progress Notes (Addendum)
[id number]    Provider:  Dr Jaynee Eagles Referring Provider: Lucille Passy, MD Primary Care Physician:  Arnette Norris, MD  CC: Parkinsonism  Interval history: Patient was not taking medication properly. She was hospitalized and sent to rehab. She got a lot better at physical therapy. Daughter has taken over medication management. Things are going much better. She is still unsteady on her feet, patient's daughter is staying home the next 12 weeks. I discussed that I recommend 24 hour facility care or a caretaker. The carbidopa/levodopa. Discussed separating the pill from protein. Her tone is improved on the Sinemet. Her gait and walking is improved as well. She is still at a slow pace and very unsteady and dazed and confused at times. She was taken off of the Abilify. She has an enlarged pituitary gland and has not had an MRI in years. TSH was significantly elevated 2 months ago, B12 was normal in March. Last HgbA1c 8.7, recommend f/u for diabetes management and she has been recommended to another doctor but unclear, recommend f/u with Dr. Deborra Medina or doctor she was referred to.   HPI: Lori Phillips is a 59 y.o. female here as a referral from Dr. Deborra Medina for Parkinsonism. She has been evaluated by multiple neurologists and apparently at [facility] and been diagnosed with Parkinsonism. She also has a PMHx of UTI, generalized weakness, DM, chronic pain utilizing pain medication, HTN, CAD, anxiety, bipolar disorder, polysubstabce abuse, a suicide attempt, Fibromyalgia and reported 70-month period when patient was bed bound that daughter thinks may have contributed to symptoms due to disuse. She is Bipolar and is on dopamine-blocking agent and daughter thinks that is may be possible that her parkinsonism started about the same time as the Abilify.    She feels she has seen improvement recently. She is moving better. She has had multiple medication changes and she feels like she is more mobile. Neurontin  and Depakote were managed. Her medications were decreased. Her hgba1c is better controlled. She is participating in physical therapy. She has memory loss. She has been diagnosed with parkinsonism. She was in bed for about 8 months starting last summer. She started going to see a doctor about back pain, it helped but after that it didn't and she was in bed because of pain. She started getting out of bed when she went to the rehab center in June. She started getting physical therapy which helped. She has home health care. She is getting occupational therapy and speech therapy. She still feels weak but she is getting better. She is more alert than what she had been as well. Continuously improving. Her tremor is better since changing the Depakote and starting the sinemet. Her sinemet has been increased. Volume of voice is stable. Some drooling on the left side of the face. The shuffling gait is improving. She can take bigger steps when she is told. Smell and taste is good. She is on Abilify. Tried Seroquel in the past. She has depression, memory changes. No hallucinations. She doesn't feel the Sinemet kick in. The tremors were in both hands. Takes the Sinemet 3x a day and feels the medication wearing off. She knows it is time and needs her sinemet. Tremor is also in the hands and legs. When she tries to take a step her legs tremble.Symptoms started around the time she started taking the Abilify.   Reviewed notes, labs and imaging from outside physicians, which showed:  CT of the head showed non-specific white matter changes  and atrophy which I think is more advanced than stated age. Personally reviewed.  Hemoglobin A1c 7.3, TSH elevated with normal free T4, CMP unremarkable, B12 was 979 but was 292 a year ago  Review of Systems: Patient complains of symptoms per HPI as well as the following symptoms: Activity change, appetite change, chills, fatigue, cold intolerance, flushing, black stools, diarrhea, constant  valves, leg swelling, incontinence of bladder, urine decreased, joint pain, apnea, frequently awakening, daytime sleepiness, joint pain, back pain, aching muscles, walking difficulty, neck stiffness, memory loss, speech difficulty, weakness, tremors, facial drooping, confusion, decreased concentration, depression . Pertinent negatives per HPI. All others negative.    Social History   Social History  . Marital Status: Single    Spouse Name: N/A  . Number of Children: 3  . Years of Education: 12   Occupational History  . disability  Other   Social History Main Topics  . Smoking status: Never Smoker   . Smokeless tobacco: Never Used  . Alcohol Use: No  . Drug Use: No  . Sexual Activity: Not on file   Other Topics Concern  . Not on file   Social History Narrative   Has a living will- desires CPR but no prolonged life support if futile.   Caffeine use: Drinks 2 L soda per day   Drinks 2 glasses tea/day    Family History  Problem Relation Age of Onset  . Hypertension Mother   . Diabetes Mother   . Bipolar disorder Father   . Colon cancer Paternal Aunt   . Colon cancer Paternal Grandmother   . Thyroid disease Neg Hx   . Hypertension Father     Past Medical History  Diagnosis Date  . CAD (coronary artery disease)     multiple caths  . Diabetes mellitus   . Hyperlipidemia   . Hypertension   . Thyroid disease     hypothyroid  . Low back pain   . Nephrolithiasis   . Fibromyalgia   . Bipolar disorder ([place])   . Anxiety   . Panic attacks   . Enlarged liver   . Rheumatoid arthritis ([place])   . Enlarged pituitary gland ([place])   . Fibromyalgia   . Scoliosis   . Osteoporosis   . Osteoarthritis   . Kidney stones   . Hypothyroidism   . Allergy   . Parkinson's disease ([place]) 2016    Past Surgical History  Procedure Laterality Date  . Vaginal hysterectomy    . Tonsillectomy    . Colonoscopy  multiple    2005 [place] records not available-polyps x 3  . Cardiac  catheterization      x3  . Esophageal dilation    . Kidney stone removal      Current Outpatient Prescriptions  Medication Sig Dispense Refill  . acetaminophen (TYLENOL) 650 MG CR tablet Take 650 mg by mouth 3 (three) times daily as needed for pain.    . B-D UF III MINI PEN NEEDLES 31G X 5 MM MISC U UTD QID  2  . carbidopa-levodopa (SINEMET IR) 25-100 MG tablet Take 1 tablet by mouth 3 (three) times daily.    . clonazePAM (KLONOPIN) 0.5 MG tablet Take 1 tablet (0.5 mg total) by mouth daily as needed. (Patient taking differently: Take 0.5 mg by mouth daily as needed for anxiety. ) 30 tablet 0  . diazepam (VALIUM) 5 MG tablet Take 2.5 mg by mouth at bedtime.     Marland Kitchen diltiazem (CARDIZEM CD) 180  MG 24 hr capsule Take 180 mg by mouth daily.    . divalproex (DEPAKOTE ER) 500 MG 24 hr tablet Take 1,000 mg by mouth daily.    Marland Kitchen gabapentin (NEURONTIN) 100 MG capsule Take 100-200 mg by mouth 2 (two) times daily. Pt takes one every morning and two every evening.    . insulin aspart (NOVOLOG) 100 UNIT/ML injection Inject 5-7 Units into the skin 2 (two) times daily. Pt uses 5 units in the morning and 7 units every evening.    Marland Kitchen levothyroxine (SYNTHROID, LEVOTHROID) 125 MCG tablet Take 1 tablet (125 mcg total) by mouth daily. 60 tablet 1  . lisinopril (PRINIVIL,ZESTRIL) 5 MG tablet Take 1 tablet (5 mg total) by mouth daily. 30 tablet 5  . metFORMIN (GLUCOPHAGE) 500 MG tablet Take 500 mg by mouth 2 (two) times daily with a meal.    . nystatin cream (MYCOSTATIN) Apply 1 application topically 2 (two) times daily as needed (for sore).    . Oxycodone HCl 10 MG TABS Take 10 mg by mouth 3 (three) times daily as needed (for pain).    . pantoprazole (PROTONIX) 40 MG tablet Take 1 tablet (40 mg total) by mouth daily. 90 tablet 0  . rosuvastatin (CRESTOR) 20 MG tablet Take 1 tablet (20 mg total) by mouth daily. 30 tablet 0  . vitamin B-12 (CYANOCOBALAMIN) 500 MCG tablet Take 500 mcg by mouth at bedtime.     .  [DISCONTINUED] TAZTIA XT 180 MG 24 hr capsule TAKE ONE CAPSULE BY MOUTH EVERY DAY 90 capsule 0   No current facility-administered medications for this visit.    Allergies as of 02/22/2015 - Review Complete 02/22/2015  Allergen Reaction Noted  . Byetta 10 mcg pen [exenatide] Nausea And Vomiting 10/03/2011  . Glyburide Other (See Comments) 08/15/2014  . Seroquel [quetiapine fumarate] Diarrhea, Nausea And Vomiting, and Other (See Comments) 08/15/2014    Vitals: BP 116/64 mmHg  Pulse 62  Ht 5\' 4"  (1.626 m)  Wt 148 lb 12.8 oz (67.495 kg)  BMI 25.53 kg/m2 Last Weight:  Wt Readings from Last 1 Encounters:  02/22/15 148 lb 12.8 oz (67.495 kg)   Last Height:   Ht Readings from Last 1 Encounters:  02/22/15 5\' 4"  (1.626 m)    Neuro: Detailed Neurologic Exam  Speech: No dysarthria or aphasia. Cognition:  Montreal Cognitive Assessment  12/15/2014  Visuospatial/ Executive (0/5) 3  Naming (0/3) 3  Attention: Read list of digits (0/2) 2  Attention: Read list of letters (0/1) 1  Attention: Serial 7 subtraction starting at 100 (0/3) 3  Language: Repeat phrase (0/2) 2  Language : Fluency (0/1) 0  Abstraction (0/2) 0  Delayed Recall (0/5) 0  Orientation (0/6) 6  Total 20  Adjusted Score (based on education) 21  ;   recent memory impaired and remote memory grossly intact  language fluent;   Impaired attention, concentration,   fund of knowledge impaired Cranial Nerves:  The pupils are equal, round, and reactive to light.  Visual fields are full to finger confrontation. Extraocular movements are intact. Trigeminal sensation is intact and the muscles of mastication are normal. The face is symmetric. The palate elevates in the midline. Hearing intact. Voice is normal. Shoulder shrug is normal. The tongue has normal motion without fasciculations.   Coordination:  Slowed but not dysmetric. Some mild bradykinesia on finger tapping bilaterally  Gait:  Has to use a  walker. multi-step turn. Short strides.  Motor Observation: Mild postural tremor. Last took sinemet  at 2 hours ago. No resting tremor.    Tone:  Some very mild cogwheeling of the right arm with facilitation  Posture:  Stooped   Strength: Mild hip flexion weakness otherwise strength is V/V in the upper and lower limbs.    Sensation: intact to LT   Reflex Exam:  DTR's:  Deep tendon reflexes are brisk in the uppers and 1+ lower extremities  Toes:  The toes are downgoing bilaterally.  Clonus:  Clonus is absent.      Assessment/Plan: OMIE FERGER is a 59 y.o. female here as a referral from Dr. Deborra Medina for Parkinsonism. She has been evaluated by multiple neurologists and apparently at [facility] and been diagnosed with Parkinsonism. She also has a PMHx of UTI, generalized weakness, DM, chronic pain utilizing pain medication, HTN, CAD, anxiety, bipolar disorder and reported 57-month period when patient was bed bound that daughter thinks may have contributed to symptoms due to disuse. She is Bipolar and is on dopamine-blocking agent and daughter thinks that is may be possible that her parkinsonism started about the same time as the Abilify, unsure.   I suspect the etiology of her symptoms is multifactorial. She does have parkinsonian symptoms.  There are multiple causes of parkinsonism including dopamine-blocking agents such as the Abilify she is taking, parkinson's plus disorders (psp, msa and others), lewy-body dementia or other neurodegenerative disorders. I did recommend following up with her psychiatrist to see if the Abilify can be changed but it appears she has a long history of mood disorder and has tried multiple medications already.   She is on a lot of medications that can cause sedation such as clonazepam and diazepam, these can cause patient's sedation and "fogginess" that daughter c/o of. Recommend f/u with psychiatry. As well as oxycodone. Recommend  decreasing any medications that can cause sedation. Also with uncontrolled diabetes and elevated TSH.   Needs follow up for her diabetes, Dr. Deborra Medina referred her to another doctor but does not appear she went Also need f/u for thyroid which was significantly eleavted. Dr. Cruzita Lederer.   2 months ago her TSH was very high, 14.95, recommend f/u with physician that manages thyroid medication  She has been evaluated at [facility] and at [facility], need records before proceeding with any further workup except need current cmp. Can continue current medications. Have not received these records will request again.  Continue Current medications.   Can consider a DaT scan  Will order MRi of the brain  Addendum 1128: Records from [facility] system received. Carotid Dopplers were performed and impression on the left no hemodynamically significant stenosis. On the right  no hemodynamically significant stenosis. MRI of the cervical spine showed no focal canal or foraminal stenosis. Normal cord signal. She was seen by Dr. Deborra Medina for extremity weakness and tremors. This is dated 07/06/2014. Extremity started 3 months ago with weakness which was progressively worsening. Patient using a walker with difficulty walking. Left-sided facial droop with drooling. Also slurred speech. She has stopped her aspirin for a week prior to an epidural spinal injection. Tremor in the hands for about 3 months. Exam significant for intact strength, left lower facial droop otherwise normal cranial nerve exam, normal reflexes. The time they performed a brain MRI to evaluate for stroke, carotid ultrasound to evaluate for carotid artery stenosis. The results of the MRI of the brain were not included in these records. A note was made for referral to neurology but no neurology records found in the [facility] transmission.  Sarina Ill, MD  [facility] [address] [place], [place] [address]  Phone [phone]  Fax [phone]  A total of 40 minutes was spent face-to-face with this patient. Over half this time was spent on counseling patient on the Parkinsonism diagnosis and different diagnostic and therapeutic options available.

## 2015-02-23 ENCOUNTER — Telehealth: Payer: Self-pay | Admitting: Family Medicine

## 2015-02-23 DIAGNOSIS — I10 Essential (primary) hypertension: Secondary | ICD-10-CM | POA: Diagnosis not present

## 2015-02-23 DIAGNOSIS — I2510 Atherosclerotic heart disease of native coronary artery without angina pectoris: Secondary | ICD-10-CM | POA: Diagnosis not present

## 2015-02-23 DIAGNOSIS — G20 Parkinson's disease: Secondary | ICD-10-CM | POA: Diagnosis not present

## 2015-02-23 DIAGNOSIS — Z9181 History of falling: Secondary | ICD-10-CM | POA: Diagnosis not present

## 2015-02-23 DIAGNOSIS — F3160 Bipolar disorder, current episode mixed, unspecified: Secondary | ICD-10-CM | POA: Diagnosis not present

## 2015-02-23 DIAGNOSIS — M0569 Rheumatoid arthritis of multiple sites with involvement of other organs and systems: Secondary | ICD-10-CM | POA: Diagnosis not present

## 2015-02-23 DIAGNOSIS — E1121 Type 2 diabetes mellitus with diabetic nephropathy: Secondary | ICD-10-CM | POA: Diagnosis not present

## 2015-02-23 NOTE — Telephone Encounter (Signed)
Lori Phillips called wanting to get a verbal order for  2 follow up visit to set up community resources

## 2015-02-23 NOTE — Telephone Encounter (Signed)
Lm on Lori Phillips's vm and provided verbal orders, again. Advised her that these orders were provided last month to [facility], and when i spoke to [facility] yesterday, after receiving an additional request, she advised that they did, in fact, have the orders, and they were in the pts chart per their records. Requested that Lori Phillips speak with [facility] at their office and confirm orders and to contact office back should she have any additional questions

## 2015-02-24 DIAGNOSIS — I10 Essential (primary) hypertension: Secondary | ICD-10-CM | POA: Diagnosis not present

## 2015-02-24 DIAGNOSIS — I2510 Atherosclerotic heart disease of native coronary artery without angina pectoris: Secondary | ICD-10-CM | POA: Diagnosis not present

## 2015-02-24 DIAGNOSIS — G20 Parkinson's disease: Secondary | ICD-10-CM | POA: Diagnosis not present

## 2015-02-24 DIAGNOSIS — E1121 Type 2 diabetes mellitus with diabetic nephropathy: Secondary | ICD-10-CM | POA: Diagnosis not present

## 2015-02-24 DIAGNOSIS — F3160 Bipolar disorder, current episode mixed, unspecified: Secondary | ICD-10-CM | POA: Diagnosis not present

## 2015-02-24 DIAGNOSIS — Z9181 History of falling: Secondary | ICD-10-CM | POA: Diagnosis not present

## 2015-02-24 DIAGNOSIS — M0569 Rheumatoid arthritis of multiple sites with involvement of other organs and systems: Secondary | ICD-10-CM | POA: Diagnosis not present

## 2015-02-26 ENCOUNTER — Ambulatory Visit: Payer: Medicare Other | Admitting: Neurology

## 2015-02-26 DIAGNOSIS — I2510 Atherosclerotic heart disease of native coronary artery without angina pectoris: Secondary | ICD-10-CM | POA: Diagnosis not present

## 2015-02-26 DIAGNOSIS — F3160 Bipolar disorder, current episode mixed, unspecified: Secondary | ICD-10-CM | POA: Diagnosis not present

## 2015-02-26 DIAGNOSIS — G20 Parkinson's disease: Secondary | ICD-10-CM | POA: Diagnosis not present

## 2015-02-26 DIAGNOSIS — I10 Essential (primary) hypertension: Secondary | ICD-10-CM | POA: Diagnosis not present

## 2015-02-26 DIAGNOSIS — Z9181 History of falling: Secondary | ICD-10-CM | POA: Diagnosis not present

## 2015-02-26 DIAGNOSIS — E1121 Type 2 diabetes mellitus with diabetic nephropathy: Secondary | ICD-10-CM | POA: Diagnosis not present

## 2015-02-26 DIAGNOSIS — M0569 Rheumatoid arthritis of multiple sites with involvement of other organs and systems: Secondary | ICD-10-CM | POA: Diagnosis not present

## 2015-02-27 ENCOUNTER — Telehealth: Payer: Self-pay | Admitting: Family Medicine

## 2015-02-27 DIAGNOSIS — E1121 Type 2 diabetes mellitus with diabetic nephropathy: Secondary | ICD-10-CM | POA: Diagnosis not present

## 2015-02-27 DIAGNOSIS — G20 Parkinson's disease: Secondary | ICD-10-CM | POA: Diagnosis not present

## 2015-02-27 DIAGNOSIS — F3160 Bipolar disorder, current episode mixed, unspecified: Secondary | ICD-10-CM | POA: Diagnosis not present

## 2015-02-27 DIAGNOSIS — M0569 Rheumatoid arthritis of multiple sites with involvement of other organs and systems: Secondary | ICD-10-CM | POA: Diagnosis not present

## 2015-02-27 DIAGNOSIS — I10 Essential (primary) hypertension: Secondary | ICD-10-CM | POA: Diagnosis not present

## 2015-02-27 DIAGNOSIS — Z9181 History of falling: Secondary | ICD-10-CM | POA: Diagnosis not present

## 2015-02-27 DIAGNOSIS — I2510 Atherosclerotic heart disease of native coronary artery without angina pectoris: Secondary | ICD-10-CM | POA: Diagnosis not present

## 2015-02-27 NOTE — Telephone Encounter (Signed)
Daughter called, she needs documentation for FMLA verifying that pt needs 24/7 care.  They want it on a letterhead, acknowledging the extension of the fmla.  Is asking for letter to be faxed to [phone].   cb number is [phone]

## 2015-02-28 ENCOUNTER — Telehealth: Payer: Self-pay | Admitting: Family Medicine

## 2015-02-28 DIAGNOSIS — E1121 Type 2 diabetes mellitus with diabetic nephropathy: Secondary | ICD-10-CM | POA: Diagnosis not present

## 2015-02-28 DIAGNOSIS — I10 Essential (primary) hypertension: Secondary | ICD-10-CM | POA: Diagnosis not present

## 2015-02-28 DIAGNOSIS — Z9181 History of falling: Secondary | ICD-10-CM | POA: Diagnosis not present

## 2015-02-28 DIAGNOSIS — M0569 Rheumatoid arthritis of multiple sites with involvement of other organs and systems: Secondary | ICD-10-CM | POA: Diagnosis not present

## 2015-02-28 DIAGNOSIS — F3160 Bipolar disorder, current episode mixed, unspecified: Secondary | ICD-10-CM | POA: Diagnosis not present

## 2015-02-28 DIAGNOSIS — I2510 Atherosclerotic heart disease of native coronary artery without angina pectoris: Secondary | ICD-10-CM | POA: Diagnosis not present

## 2015-02-28 DIAGNOSIS — G20 Parkinson's disease: Secondary | ICD-10-CM | POA: Diagnosis not present

## 2015-02-28 NOTE — Telephone Encounter (Signed)
We didn't receive any forms to be completed, to my knowledge, and I provided verbal orders on two separate occassions. The second time, i spoke with [facility],  the same person that I had spoken with previously, who confirmed she could received the information and it had been noted in pts chart on their end. I then left a message on the [facility] phone, confirming all orders.

## 2015-02-28 NOTE — Telephone Encounter (Signed)
Pt received a denial letter for home health care.  The letter states that the doctor did not send in all the necessary info signed off  Please call daughter back to discuss further Request cb [phone] Thank you

## 2015-02-28 NOTE — Telephone Encounter (Signed)
Form signed and in my box. 

## 2015-02-28 NOTE — Telephone Encounter (Signed)
Medical extension request In dr Deborra Medina in box

## 2015-02-28 NOTE — Telephone Encounter (Signed)
Pt daughter called wanting when you would like to see ms Campion again she has a mri schedule for Thursday 11/10 @ [facility]

## 2015-03-01 ENCOUNTER — Ambulatory Visit
Admission: RE | Admit: 2015-03-01 | Discharge: 2015-03-01 | Disposition: A | Discharge status: Home / Self Care | Payer: Medicare Other | Source: Ambulatory Visit | Attending: Neurology | Admitting: Neurology

## 2015-03-01 DIAGNOSIS — G20 Parkinson's disease: Secondary | ICD-10-CM

## 2015-03-01 MED ORDER — GADOBENATE DIMEGLUMINE 529 MG/ML IV SOLN
6.0000 mL | Freq: Once | INTRAVENOUS | Status: DC | PRN
Start: 2015-03-01 — End: 2015-03-02

## 2015-03-02 ENCOUNTER — Telehealth: Payer: Self-pay | Admitting: Family Medicine

## 2015-03-02 DIAGNOSIS — I2510 Atherosclerotic heart disease of native coronary artery without angina pectoris: Secondary | ICD-10-CM | POA: Diagnosis not present

## 2015-03-02 DIAGNOSIS — F3160 Bipolar disorder, current episode mixed, unspecified: Secondary | ICD-10-CM | POA: Diagnosis not present

## 2015-03-02 DIAGNOSIS — Z9181 History of falling: Secondary | ICD-10-CM | POA: Diagnosis not present

## 2015-03-02 DIAGNOSIS — G20 Parkinson's disease: Secondary | ICD-10-CM | POA: Diagnosis not present

## 2015-03-02 DIAGNOSIS — E1121 Type 2 diabetes mellitus with diabetic nephropathy: Secondary | ICD-10-CM | POA: Diagnosis not present

## 2015-03-02 DIAGNOSIS — I10 Essential (primary) hypertension: Secondary | ICD-10-CM | POA: Diagnosis not present

## 2015-03-02 DIAGNOSIS — M0569 Rheumatoid arthritis of multiple sites with involvement of other organs and systems: Secondary | ICD-10-CM | POA: Diagnosis not present

## 2015-03-02 NOTE — Telephone Encounter (Signed)
aislnn called she need a verbal order to continue home health Pt for 2x week for 4 weeks

## 2015-03-05 DIAGNOSIS — G20 Parkinson's disease: Secondary | ICD-10-CM | POA: Diagnosis not present

## 2015-03-05 DIAGNOSIS — I2510 Atherosclerotic heart disease of native coronary artery without angina pectoris: Secondary | ICD-10-CM | POA: Diagnosis not present

## 2015-03-05 DIAGNOSIS — E1121 Type 2 diabetes mellitus with diabetic nephropathy: Secondary | ICD-10-CM | POA: Diagnosis not present

## 2015-03-05 DIAGNOSIS — M0569 Rheumatoid arthritis of multiple sites with involvement of other organs and systems: Secondary | ICD-10-CM | POA: Diagnosis not present

## 2015-03-05 DIAGNOSIS — F3160 Bipolar disorder, current episode mixed, unspecified: Secondary | ICD-10-CM | POA: Diagnosis not present

## 2015-03-05 DIAGNOSIS — I10 Essential (primary) hypertension: Secondary | ICD-10-CM | POA: Diagnosis not present

## 2015-03-05 DIAGNOSIS — Z9181 History of falling: Secondary | ICD-10-CM | POA: Diagnosis not present

## 2015-03-05 NOTE — Telephone Encounter (Signed)
Ok to give verbal orders as requested. 

## 2015-03-05 NOTE — Telephone Encounter (Signed)
Lm on aislnn's vm and provided verbal orders

## 2015-03-06 ENCOUNTER — Telehealth: Payer: Self-pay | Admitting: Family Medicine

## 2015-03-06 ENCOUNTER — Telehealth: Payer: Self-pay | Admitting: *Deleted

## 2015-03-06 DIAGNOSIS — G20 Parkinson's disease: Secondary | ICD-10-CM | POA: Diagnosis not present

## 2015-03-06 DIAGNOSIS — Z9181 History of falling: Secondary | ICD-10-CM | POA: Diagnosis not present

## 2015-03-06 DIAGNOSIS — M0569 Rheumatoid arthritis of multiple sites with involvement of other organs and systems: Secondary | ICD-10-CM | POA: Diagnosis not present

## 2015-03-06 DIAGNOSIS — F3160 Bipolar disorder, current episode mixed, unspecified: Secondary | ICD-10-CM | POA: Diagnosis not present

## 2015-03-06 DIAGNOSIS — I2510 Atherosclerotic heart disease of native coronary artery without angina pectoris: Secondary | ICD-10-CM | POA: Diagnosis not present

## 2015-03-06 DIAGNOSIS — I10 Essential (primary) hypertension: Secondary | ICD-10-CM | POA: Diagnosis not present

## 2015-03-06 DIAGNOSIS — E1121 Type 2 diabetes mellitus with diabetic nephropathy: Secondary | ICD-10-CM | POA: Diagnosis not present

## 2015-03-06 NOTE — Telephone Encounter (Signed)
-----   Message from Melvenia Beam, MD sent at 03/06/2015  8:42 AM EST ----- Let patient know the MRI did not show a pituitary lesion. The rest of the brain was unremarkable. I did not find the [facility] records, can you ask Jackelyn Poling to see if she can ask [facility] to resent the documents? thanks

## 2015-03-06 NOTE — Telephone Encounter (Signed)
Lori Phillips pt daughter called she needs a note on letter head stating she is primary care giver for Lori Phillips  She needs an extension for fmla from 03/02/15 to 04/25/15 to care for her mother

## 2015-03-06 NOTE — Telephone Encounter (Signed)
Ok to write letter as requested.

## 2015-03-06 NOTE — Telephone Encounter (Signed)
Called pt. Spoke to her about MRI not showing a pituitary lesion. Rest of brain unremarkable. We have not received records from [facility], we will resend request. Pt verbalized understanding.

## 2015-03-07 ENCOUNTER — Encounter: Payer: Self-pay | Admitting: *Deleted

## 2015-03-07 ENCOUNTER — Telehealth: Payer: Self-pay | Admitting: *Deleted

## 2015-03-07 NOTE — Telephone Encounter (Signed)
Ok to write letter as requested.

## 2015-03-07 NOTE — Telephone Encounter (Signed)
Shirlean Mylar, can you please get more information?  Ok to write letter as requested if appropriate.

## 2015-03-07 NOTE — Telephone Encounter (Signed)
Spoke with [name]  She stated ms Eudy is under care of home health for OT and PT only [name] stated she has been told by several people ms Lehrke needs 24/7 care  Ex.  Home health nurse/ PT and OT and a dr here told her that

## 2015-03-07 NOTE — Telephone Encounter (Signed)
Patient results on [place] desk.

## 2015-03-07 NOTE — Telephone Encounter (Signed)
Spoke to pts daughter and informed her letter printed and given to [employer]

## 2015-03-07 NOTE — Telephone Encounter (Signed)
You may have to write this letter as it will need to include as to why she is out with her mother. It was my understanding, via pt chart, that she is under the care of home health.

## 2015-03-08 DIAGNOSIS — F3160 Bipolar disorder, current episode mixed, unspecified: Secondary | ICD-10-CM | POA: Diagnosis not present

## 2015-03-08 DIAGNOSIS — M0569 Rheumatoid arthritis of multiple sites with involvement of other organs and systems: Secondary | ICD-10-CM | POA: Diagnosis not present

## 2015-03-08 DIAGNOSIS — I10 Essential (primary) hypertension: Secondary | ICD-10-CM | POA: Diagnosis not present

## 2015-03-08 DIAGNOSIS — Z9181 History of falling: Secondary | ICD-10-CM | POA: Diagnosis not present

## 2015-03-08 DIAGNOSIS — G20 Parkinson's disease: Secondary | ICD-10-CM | POA: Diagnosis not present

## 2015-03-08 DIAGNOSIS — I2510 Atherosclerotic heart disease of native coronary artery without angina pectoris: Secondary | ICD-10-CM | POA: Diagnosis not present

## 2015-03-08 DIAGNOSIS — E1121 Type 2 diabetes mellitus with diabetic nephropathy: Secondary | ICD-10-CM | POA: Diagnosis not present

## 2015-03-09 DIAGNOSIS — F3160 Bipolar disorder, current episode mixed, unspecified: Secondary | ICD-10-CM | POA: Diagnosis not present

## 2015-03-09 DIAGNOSIS — G20 Parkinson's disease: Secondary | ICD-10-CM | POA: Diagnosis not present

## 2015-03-09 DIAGNOSIS — E1121 Type 2 diabetes mellitus with diabetic nephropathy: Secondary | ICD-10-CM | POA: Diagnosis not present

## 2015-03-09 DIAGNOSIS — I2510 Atherosclerotic heart disease of native coronary artery without angina pectoris: Secondary | ICD-10-CM | POA: Diagnosis not present

## 2015-03-09 DIAGNOSIS — Z9181 History of falling: Secondary | ICD-10-CM | POA: Diagnosis not present

## 2015-03-09 DIAGNOSIS — M0569 Rheumatoid arthritis of multiple sites with involvement of other organs and systems: Secondary | ICD-10-CM | POA: Diagnosis not present

## 2015-03-09 DIAGNOSIS — I10 Essential (primary) hypertension: Secondary | ICD-10-CM | POA: Diagnosis not present

## 2015-03-12 DIAGNOSIS — F3160 Bipolar disorder, current episode mixed, unspecified: Secondary | ICD-10-CM | POA: Diagnosis not present

## 2015-03-12 DIAGNOSIS — M0569 Rheumatoid arthritis of multiple sites with involvement of other organs and systems: Secondary | ICD-10-CM | POA: Diagnosis not present

## 2015-03-12 DIAGNOSIS — G20 Parkinson's disease: Secondary | ICD-10-CM | POA: Diagnosis not present

## 2015-03-12 DIAGNOSIS — Z9181 History of falling: Secondary | ICD-10-CM | POA: Diagnosis not present

## 2015-03-12 DIAGNOSIS — E1121 Type 2 diabetes mellitus with diabetic nephropathy: Secondary | ICD-10-CM | POA: Diagnosis not present

## 2015-03-12 DIAGNOSIS — I2510 Atherosclerotic heart disease of native coronary artery without angina pectoris: Secondary | ICD-10-CM | POA: Diagnosis not present

## 2015-03-12 DIAGNOSIS — I10 Essential (primary) hypertension: Secondary | ICD-10-CM | POA: Diagnosis not present

## 2015-03-12 NOTE — Telephone Encounter (Signed)
Left message letting Lori Phillips know paperwork had been faxed 11/18 Copy for pt Copy for scan Copy for file

## 2015-03-13 ENCOUNTER — Telehealth: Payer: Self-pay | Admitting: Family Medicine

## 2015-03-13 ENCOUNTER — Ambulatory Visit: Payer: Medicare Other | Admitting: Family Medicine

## 2015-03-13 DIAGNOSIS — Z0289 Encounter for other administrative examinations: Secondary | ICD-10-CM

## 2015-03-13 NOTE — Telephone Encounter (Signed)
Pt did not come in for their appt today for follow up. Please let me know if pt needs to be contacted immediately for follow up or no follow up needed. Best phone number to contact pt is [phone].

## 2015-03-13 NOTE — Telephone Encounter (Signed)
Yes please do contact pt.

## 2015-03-14 DIAGNOSIS — E1121 Type 2 diabetes mellitus with diabetic nephropathy: Secondary | ICD-10-CM | POA: Diagnosis not present

## 2015-03-14 DIAGNOSIS — G20 Parkinson's disease: Secondary | ICD-10-CM | POA: Diagnosis not present

## 2015-03-14 DIAGNOSIS — M0569 Rheumatoid arthritis of multiple sites with involvement of other organs and systems: Secondary | ICD-10-CM | POA: Diagnosis not present

## 2015-03-14 DIAGNOSIS — I2510 Atherosclerotic heart disease of native coronary artery without angina pectoris: Secondary | ICD-10-CM | POA: Diagnosis not present

## 2015-03-16 ENCOUNTER — Other Ambulatory Visit: Payer: Self-pay | Admitting: Family Medicine

## 2015-03-16 DIAGNOSIS — I10 Essential (primary) hypertension: Secondary | ICD-10-CM | POA: Diagnosis not present

## 2015-03-16 DIAGNOSIS — G20 Parkinson's disease: Secondary | ICD-10-CM | POA: Diagnosis not present

## 2015-03-16 DIAGNOSIS — F3160 Bipolar disorder, current episode mixed, unspecified: Secondary | ICD-10-CM | POA: Diagnosis not present

## 2015-03-16 DIAGNOSIS — E1121 Type 2 diabetes mellitus with diabetic nephropathy: Secondary | ICD-10-CM | POA: Diagnosis not present

## 2015-03-16 DIAGNOSIS — Z9181 History of falling: Secondary | ICD-10-CM | POA: Diagnosis not present

## 2015-03-16 DIAGNOSIS — M0569 Rheumatoid arthritis of multiple sites with involvement of other organs and systems: Secondary | ICD-10-CM | POA: Diagnosis not present

## 2015-03-16 DIAGNOSIS — I2510 Atherosclerotic heart disease of native coronary artery without angina pectoris: Secondary | ICD-10-CM | POA: Diagnosis not present

## 2015-03-19 DIAGNOSIS — E1121 Type 2 diabetes mellitus with diabetic nephropathy: Secondary | ICD-10-CM | POA: Diagnosis not present

## 2015-03-19 DIAGNOSIS — F3160 Bipolar disorder, current episode mixed, unspecified: Secondary | ICD-10-CM | POA: Diagnosis not present

## 2015-03-19 DIAGNOSIS — Z9181 History of falling: Secondary | ICD-10-CM | POA: Diagnosis not present

## 2015-03-19 DIAGNOSIS — G20 Parkinson's disease: Secondary | ICD-10-CM | POA: Diagnosis not present

## 2015-03-19 DIAGNOSIS — I10 Essential (primary) hypertension: Secondary | ICD-10-CM | POA: Diagnosis not present

## 2015-03-19 DIAGNOSIS — I2510 Atherosclerotic heart disease of native coronary artery without angina pectoris: Secondary | ICD-10-CM | POA: Diagnosis not present

## 2015-03-19 DIAGNOSIS — M0569 Rheumatoid arthritis of multiple sites with involvement of other organs and systems: Secondary | ICD-10-CM | POA: Diagnosis not present

## 2015-03-20 DIAGNOSIS — I10 Essential (primary) hypertension: Secondary | ICD-10-CM | POA: Diagnosis not present

## 2015-03-20 DIAGNOSIS — E1121 Type 2 diabetes mellitus with diabetic nephropathy: Secondary | ICD-10-CM | POA: Diagnosis not present

## 2015-03-20 DIAGNOSIS — G20 Parkinson's disease: Secondary | ICD-10-CM | POA: Diagnosis not present

## 2015-03-20 DIAGNOSIS — M0569 Rheumatoid arthritis of multiple sites with involvement of other organs and systems: Secondary | ICD-10-CM | POA: Diagnosis not present

## 2015-03-20 DIAGNOSIS — Z9181 History of falling: Secondary | ICD-10-CM | POA: Diagnosis not present

## 2015-03-20 DIAGNOSIS — F3160 Bipolar disorder, current episode mixed, unspecified: Secondary | ICD-10-CM | POA: Diagnosis not present

## 2015-03-20 DIAGNOSIS — I2510 Atherosclerotic heart disease of native coronary artery without angina pectoris: Secondary | ICD-10-CM | POA: Diagnosis not present

## 2015-03-21 DIAGNOSIS — G20 Parkinson's disease: Secondary | ICD-10-CM | POA: Diagnosis not present

## 2015-03-21 DIAGNOSIS — M0569 Rheumatoid arthritis of multiple sites with involvement of other organs and systems: Secondary | ICD-10-CM | POA: Diagnosis not present

## 2015-03-21 DIAGNOSIS — F3160 Bipolar disorder, current episode mixed, unspecified: Secondary | ICD-10-CM | POA: Diagnosis not present

## 2015-03-21 DIAGNOSIS — Z9181 History of falling: Secondary | ICD-10-CM | POA: Diagnosis not present

## 2015-03-21 DIAGNOSIS — I10 Essential (primary) hypertension: Secondary | ICD-10-CM | POA: Diagnosis not present

## 2015-03-21 DIAGNOSIS — I2510 Atherosclerotic heart disease of native coronary artery without angina pectoris: Secondary | ICD-10-CM | POA: Diagnosis not present

## 2015-03-21 DIAGNOSIS — E1121 Type 2 diabetes mellitus with diabetic nephropathy: Secondary | ICD-10-CM | POA: Diagnosis not present

## 2015-03-22 ENCOUNTER — Other Ambulatory Visit: Payer: Self-pay | Admitting: Family Medicine

## 2015-03-22 DIAGNOSIS — G20 Parkinson's disease: Secondary | ICD-10-CM | POA: Diagnosis not present

## 2015-03-22 DIAGNOSIS — Z9181 History of falling: Secondary | ICD-10-CM | POA: Diagnosis not present

## 2015-03-22 DIAGNOSIS — I10 Essential (primary) hypertension: Secondary | ICD-10-CM | POA: Diagnosis not present

## 2015-03-22 DIAGNOSIS — E1121 Type 2 diabetes mellitus with diabetic nephropathy: Secondary | ICD-10-CM | POA: Diagnosis not present

## 2015-03-22 DIAGNOSIS — F3160 Bipolar disorder, current episode mixed, unspecified: Secondary | ICD-10-CM | POA: Diagnosis not present

## 2015-03-22 DIAGNOSIS — M0569 Rheumatoid arthritis of multiple sites with involvement of other organs and systems: Secondary | ICD-10-CM | POA: Diagnosis not present

## 2015-03-22 DIAGNOSIS — I2510 Atherosclerotic heart disease of native coronary artery without angina pectoris: Secondary | ICD-10-CM | POA: Diagnosis not present

## 2015-03-23 NOTE — Telephone Encounter (Signed)
Pt is currently seeing neuro. Last f/u 02/2015. pls advise

## 2015-03-23 NOTE — Telephone Encounter (Signed)
Does not look like Dr. Deborra Medina has prescribed before. Have her call neurologist and see if he is willing to order since he is following her for this.

## 2015-03-26 ENCOUNTER — Ambulatory Visit (INDEPENDENT_AMBULATORY_CARE_PROVIDER_SITE_OTHER): Payer: Medicare Other | Admitting: Family Medicine

## 2015-03-26 ENCOUNTER — Telehealth: Payer: Self-pay | Admitting: Family Medicine

## 2015-03-26 ENCOUNTER — Encounter: Payer: Self-pay | Admitting: Family Medicine

## 2015-03-26 VITALS — BP 146/82 | HR 66 | Temp 97.6°F | Wt 143.2 lb

## 2015-03-26 DIAGNOSIS — R531 Weakness: Secondary | ICD-10-CM | POA: Diagnosis not present

## 2015-03-26 DIAGNOSIS — E038 Other specified hypothyroidism: Secondary | ICD-10-CM | POA: Diagnosis not present

## 2015-03-26 DIAGNOSIS — E1165 Type 2 diabetes mellitus with hyperglycemia: Secondary | ICD-10-CM | POA: Diagnosis not present

## 2015-03-26 DIAGNOSIS — F3132 Bipolar disorder, current episode depressed, moderate: Secondary | ICD-10-CM | POA: Diagnosis not present

## 2015-03-26 DIAGNOSIS — E1143 Type 2 diabetes mellitus with diabetic autonomic (poly)neuropathy: Secondary | ICD-10-CM

## 2015-03-26 DIAGNOSIS — IMO0002 Reserved for concepts with insufficient information to code with codable children: Secondary | ICD-10-CM

## 2015-03-26 LAB — COMPREHENSIVE METABOLIC PANEL
ALK PHOS: 64 U/L (ref 39–117)
ALT: 4 U/L (ref 0–35)
AST: 10 U/L (ref 0–37)
Albumin: 4.4 g/dL (ref 3.5–5.2)
BILIRUBIN TOTAL: 0.4 mg/dL (ref 0.2–1.2)
BUN: 17 mg/dL (ref 6–23)
CALCIUM: 10.4 mg/dL (ref 8.4–10.5)
CO2: 28 mEq/L (ref 19–32)
Chloride: 101 mEq/L (ref 96–112)
Creatinine, Ser: 1.1 mg/dL (ref 0.40–1.20)
GFR: 53.53 mL/min — AB (ref >60.00)
Glucose, Bld: 132 mg/dL — ABNORMAL HIGH (ref 70–99)
POTASSIUM: 4.1 meq/L (ref 3.5–5.1)
Sodium: 141 mEq/L (ref 135–145)
TOTAL PROTEIN: 7.2 g/dL (ref 6.0–8.3)

## 2015-03-26 LAB — HEMOGLOBIN A1C: HEMOGLOBIN A1C: 6.6 % — AB (ref 4.6–6.5)

## 2015-03-26 LAB — TSH: TSH: 0.18 u[IU]/mL — ABNORMAL LOW (ref 0.35–4.50)

## 2015-03-26 NOTE — Progress Notes (Signed)
Subjective:    Patient ID: Lori Phillips, female    DOB: [birth year], 59 y.o.   MRN: [id number]  HPI Here with daughter [name] for extension of PT/OT.  Daughter and pt are pleased with her progress- can now ambulate with walker, still needs help with transfers due to imbalance and still feels weak.  Feels PT/OT have helped a lot.  Bipolar disorder has been controlled Was taken off the  in case that was causing the Parkinsonism which has seemed to helped.  Now on depakote. No recent mania and not depressed Dr Karalee Height is the psychiatrist and she brings in order for depakote level to be drawn from him.    Current Outpatient Prescriptions on File Prior to Visit  Medication Sig Dispense Refill  . acetaminophen (TYLENOL) 650 MG CR tablet Take 650 mg by mouth 3 (three) times daily as needed for pain.    . B-D UF III MINI PEN NEEDLES 31G X 5 MM MISC U UTD QID  2  . carbidopa-levodopa (SINEMET IR) 25-100 MG tablet Take 1 tablet by mouth 3 (three) times daily.    Marland Kitchen CARTIA XT 180 MG 24 hr capsule TAKE 1 CAPSULE BY MOUTH EVERY DAY 90 capsule 1  . clonazePAM (KLONOPIN) 0.5 MG tablet Take 1 tablet (0.5 mg total) by mouth daily as needed. (Patient taking differently: Take 0.5 mg by mouth daily as needed for anxiety. ) 30 tablet 0  . diazepam (VALIUM) 5 MG tablet Take 2.5 mg by mouth at bedtime.     Marland Kitchen diltiazem (CARDIZEM CD) 180 MG 24 hr capsule Take 180 mg by mouth daily.    . divalproex (DEPAKOTE ER) 500 MG 24 hr tablet Take 1,000 mg by mouth daily.    Marland Kitchen gabapentin (NEURONTIN) 100 MG capsule Take 100-200 mg by mouth 2 (two) times daily. Pt takes one every morning and two every evening.    . insulin aspart (NOVOLOG) 100 UNIT/ML injection Inject 5-7 Units into the skin 2 (two) times daily. Pt uses 5 units in the morning and 7 units every evening.    Marland Kitchen levothyroxine (SYNTHROID, LEVOTHROID) 125 MCG tablet Take 1 tablet (125 mcg total) by mouth daily. 60 tablet 1  . lisinopril (PRINIVIL,ZESTRIL) 5  MG tablet TAKE 1 TABLET BY MOUTH EVERY DAY 30 tablet 2  . metFORMIN (GLUCOPHAGE) 500 MG tablet Take 500 mg by mouth 2 (two) times daily with a meal.    . nystatin cream (MYCOSTATIN) Apply 1 application topically 2 (two) times daily as needed (for sore).    . Oxycodone HCl 10 MG TABS Take 10 mg by mouth 3 (three) times daily as needed (for pain).    . pantoprazole (PROTONIX) 40 MG tablet Take 1 tablet (40 mg total) by mouth daily. 90 tablet 0  . rosuvastatin (CRESTOR) 20 MG tablet Take 1 tablet (20 mg total) by mouth daily. 30 tablet 0  . vitamin B-12 (CYANOCOBALAMIN) 500 MCG tablet Take 500 mcg by mouth at bedtime.     . [DISCONTINUED] TAZTIA XT 180 MG 24 hr capsule TAKE ONE CAPSULE BY MOUTH EVERY DAY 90 capsule 0   No current facility-administered medications on file prior to visit.    Allergies  Allergen Reactions  . Byetta 10 Mcg Pen [Exenatide] Nausea And Vomiting  . Glyburide Other (See Comments)    Reaction:  Dizziness and confusion   . Seroquel [Quetiapine Fumarate] Diarrhea, Nausea And Vomiting and Other (See Comments)    Reaction:  Confusion  Past Medical History  Diagnosis Date  . CAD (coronary artery disease)     multiple caths  . Diabetes mellitus   . Hyperlipidemia   . Hypertension   . Thyroid disease     hypothyroid  . Low back pain   . Nephrolithiasis   . Fibromyalgia   . Bipolar disorder ([place])   . Anxiety   . Panic attacks   . Enlarged liver   . Rheumatoid arthritis ([place])   . Enlarged pituitary gland ([place])   . Fibromyalgia   . Scoliosis   . Osteoporosis   . Osteoarthritis   . Kidney stones   . Hypothyroidism   . Allergy   . Parkinson's disease ([place]) 2016    Past Surgical History  Procedure Laterality Date  . Vaginal hysterectomy    . Tonsillectomy    . Colonoscopy  multiple    2005 [place] records not available-polyps x 3  . Cardiac catheterization      x3  . Esophageal dilation    . Kidney stone removal      Family History  Problem  Relation Age of Onset  . Hypertension Mother   . Diabetes Mother   . Bipolar disorder Father   . Colon cancer Paternal Aunt   . Colon cancer Paternal Grandmother   . Thyroid disease Neg Hx   . Hypertension Father     Social History   Social History  . Marital Status: Single    Spouse Name: N/A  . Number of Children: 3  . Years of Education: 12   Occupational History  . disability  Other   Social History Main Topics  . Smoking status: Never Smoker   . Smokeless tobacco: Never Used  . Alcohol Use: No  . Drug Use: No  . Sexual Activity: Not on file   Other Topics Concern  . Not on file   Social History Narrative   Has a living will- desires CPR but no prolonged life support if futile.   Caffeine use: Drinks 2 L soda per day   Drinks 2 glasses tea/day   Review of Systems Appetite improving a little but still losing weight- "I know I need to eat more" Wt Readings from Last 3 Encounters:  03/26/15 143 lb 4 oz (64.978 kg)  02/22/15 148 lb 12.8 oz (67.495 kg)  01/30/15 156 lb (70.761 kg)  Feels mood has been good- no recent mania, denies feeling depressed  Objective:   Physical Exam  Constitutional: No distress.  Neck: No thyromegaly present.  Cardiovascular: Normal rate, regular rhythm and normal heart sounds.  Exam reveals no gallop.   No murmur heard. Pulmonary/Chest: Effort normal and breath sounds normal. No respiratory distress. She has no wheezes. She has no rales.  Musculoskeletal: She exhibits no edema.  Lymphadenopathy:    She has no cervical adenopathy.  Neurological:  Less slumped over today. Face symmetric 3+-4/5 strength in arms 3/5 right leg, 3+/5 left leg Significant bradykinesia but moving faster with her rollator          Assessment & Plan:

## 2015-03-26 NOTE — Telephone Encounter (Signed)
Returned call and spoke with Pamala Hurry.

## 2015-03-26 NOTE — Patient Instructions (Signed)
Great to see you. Please stop by to see Lori Phillips on your way out.   

## 2015-03-26 NOTE — Progress Notes (Signed)
Pre visit review using our clinic review tool, if applicable. No additional management support is needed unless otherwise documented below in the visit note. 

## 2015-03-26 NOTE — Telephone Encounter (Signed)
Lori Phillips from [facility] called asking for a call back regarding the new orders she received today.   Her call back # [phone]

## 2015-03-26 NOTE — Assessment & Plan Note (Signed)
>  25 minutes spent in face to face time with patient, >50% spent in counselling or coordination of care Improving but agree that she could benefit from [facility] aid, PT/OT for balance, strengthening and to help with transfers/ Order placed for extension.

## 2015-03-27 ENCOUNTER — Other Ambulatory Visit: Payer: Self-pay | Admitting: Internal Medicine

## 2015-03-27 ENCOUNTER — Encounter: Payer: Self-pay | Admitting: *Deleted

## 2015-03-27 ENCOUNTER — Other Ambulatory Visit: Payer: Self-pay | Admitting: *Deleted

## 2015-03-27 LAB — VALPROIC ACID LEVEL: Valproic Acid Lvl: 114.6 ug/mL — ABNORMAL HIGH (ref 50.0–100.0)

## 2015-03-27 MED ORDER — LEVOTHYROXINE SODIUM 112 MCG PO TABS: 112.0000 ug | ORAL_TABLET | Freq: Every day | ORAL | Status: DC

## 2015-03-28 DIAGNOSIS — Z9181 History of falling: Secondary | ICD-10-CM | POA: Diagnosis not present

## 2015-03-28 DIAGNOSIS — E1121 Type 2 diabetes mellitus with diabetic nephropathy: Secondary | ICD-10-CM | POA: Diagnosis not present

## 2015-03-28 DIAGNOSIS — I10 Essential (primary) hypertension: Secondary | ICD-10-CM | POA: Diagnosis not present

## 2015-03-28 DIAGNOSIS — G20 Parkinson's disease: Secondary | ICD-10-CM | POA: Diagnosis not present

## 2015-03-28 DIAGNOSIS — M0569 Rheumatoid arthritis of multiple sites with involvement of other organs and systems: Secondary | ICD-10-CM | POA: Diagnosis not present

## 2015-03-28 DIAGNOSIS — I2510 Atherosclerotic heart disease of native coronary artery without angina pectoris: Secondary | ICD-10-CM | POA: Diagnosis not present

## 2015-03-28 DIAGNOSIS — F3160 Bipolar disorder, current episode mixed, unspecified: Secondary | ICD-10-CM | POA: Diagnosis not present

## 2015-03-30 DIAGNOSIS — F3160 Bipolar disorder, current episode mixed, unspecified: Secondary | ICD-10-CM | POA: Diagnosis not present

## 2015-03-30 DIAGNOSIS — G20 Parkinson's disease: Secondary | ICD-10-CM | POA: Diagnosis not present

## 2015-03-30 DIAGNOSIS — M0569 Rheumatoid arthritis of multiple sites with involvement of other organs and systems: Secondary | ICD-10-CM | POA: Diagnosis not present

## 2015-03-30 DIAGNOSIS — I2510 Atherosclerotic heart disease of native coronary artery without angina pectoris: Secondary | ICD-10-CM | POA: Diagnosis not present

## 2015-03-30 DIAGNOSIS — Z9181 History of falling: Secondary | ICD-10-CM | POA: Diagnosis not present

## 2015-03-30 DIAGNOSIS — E1121 Type 2 diabetes mellitus with diabetic nephropathy: Secondary | ICD-10-CM | POA: Diagnosis not present

## 2015-03-30 DIAGNOSIS — I10 Essential (primary) hypertension: Secondary | ICD-10-CM | POA: Diagnosis not present

## 2015-04-06 ENCOUNTER — Ambulatory Visit: Payer: Medicare Other | Admitting: Internal Medicine

## 2015-04-06 ENCOUNTER — Other Ambulatory Visit: Payer: Self-pay | Admitting: Family Medicine

## 2015-04-10 ENCOUNTER — Telehealth: Payer: Self-pay

## 2015-04-10 NOTE — Telephone Encounter (Signed)
Skip with [place] [facility] left v/m requesting cb about verification of instructions and quantity of metformin that was sent in by Dr Deborra Medina. Do not see where refill was sent for metformin.Please advise.

## 2015-04-10 NOTE — Telephone Encounter (Signed)
I do not see this refill?

## 2015-04-11 ENCOUNTER — Other Ambulatory Visit: Payer: Self-pay | Admitting: Internal Medicine

## 2015-04-11 NOTE — Telephone Encounter (Signed)
Pt sees endo for DM management

## 2015-04-12 ENCOUNTER — Other Ambulatory Visit: Payer: Self-pay | Admitting: Internal Medicine

## 2015-04-12 DIAGNOSIS — IMO0002 Reserved for concepts with insufficient information to code with codable children: Secondary | ICD-10-CM

## 2015-04-12 DIAGNOSIS — E1143 Type 2 diabetes mellitus with diabetic autonomic (poly)neuropathy: Secondary | ICD-10-CM

## 2015-04-12 DIAGNOSIS — E1165 Type 2 diabetes mellitus with hyperglycemia: Principal | ICD-10-CM

## 2015-04-12 MED ORDER — METFORMIN HCL 500 MG PO TABS: 500.0000 mg | ORAL_TABLET | Freq: Two times a day (BID) | ORAL | Status: DC

## 2015-04-12 NOTE — Telephone Encounter (Signed)
Rx sent 

## 2015-04-15 ENCOUNTER — Other Ambulatory Visit: Payer: Self-pay | Admitting: Internal Medicine

## 2015-05-09 ENCOUNTER — Other Ambulatory Visit: Payer: Self-pay | Admitting: Family Medicine

## 2015-05-10 NOTE — Telephone Encounter (Signed)
Spoke to pt and advised per Dr Aron. Lab appt scheduled.  

## 2015-05-10 NOTE — Telephone Encounter (Signed)
Attempted to contact pt; line is busy

## 2015-05-10 NOTE — Telephone Encounter (Signed)
Pt recently had labs, but lipid panel not drawn. Last lipid 2014

## 2015-05-10 NOTE — Telephone Encounter (Signed)
Ok to refill one time only.  Please schedule her for lipid panel.

## 2015-05-15 ENCOUNTER — Other Ambulatory Visit: Payer: Medicare Other

## 2015-05-15 ENCOUNTER — Other Ambulatory Visit: Payer: Self-pay | Admitting: *Deleted

## 2015-05-15 MED ORDER — PANTOPRAZOLE SODIUM 40 MG PO TBEC: 40.0000 mg | DELAYED_RELEASE_TABLET | Freq: Every day | ORAL | Status: DC

## 2015-05-16 ENCOUNTER — Other Ambulatory Visit (INDEPENDENT_AMBULATORY_CARE_PROVIDER_SITE_OTHER): Payer: Medicare Other

## 2015-05-16 DIAGNOSIS — E785 Hyperlipidemia, unspecified: Secondary | ICD-10-CM

## 2015-05-16 LAB — LIPID PANEL
CHOL/HDL RATIO: 4
CHOLESTEROL: 173 mg/dL (ref 0–200)
HDL: 49.3 mg/dL (ref >39.00)
NonHDL: 123.34
Triglycerides: 322 mg/dL — ABNORMAL HIGH (ref 0.0–149.0)
VLDL: 64.4 mg/dL — ABNORMAL HIGH (ref 0.0–40.0)

## 2015-05-16 LAB — LDL CHOLESTEROL, DIRECT: Direct LDL: 78 mg/dL

## 2015-06-01 ENCOUNTER — Ambulatory Visit: Payer: Medicare Other | Admitting: Internal Medicine

## 2015-06-09 ENCOUNTER — Other Ambulatory Visit: Payer: Self-pay | Admitting: Family Medicine

## 2015-06-15 ENCOUNTER — Other Ambulatory Visit: Payer: Self-pay | Admitting: Internal Medicine

## 2015-06-26 ENCOUNTER — Encounter: Payer: Self-pay | Admitting: Neurology

## 2015-06-26 ENCOUNTER — Ambulatory Visit (INDEPENDENT_AMBULATORY_CARE_PROVIDER_SITE_OTHER): Payer: Medicare Other | Admitting: Neurology

## 2015-06-26 VITALS — BP 158/78 | HR 61

## 2015-06-26 DIAGNOSIS — G20 Parkinson's disease: Secondary | ICD-10-CM | POA: Diagnosis not present

## 2015-06-26 MED ORDER — CARBIDOPA-LEVODOPA 25-100 MG PO TABS: 1.0000 | ORAL_TABLET | Freq: Four times a day (QID) | ORAL | Status: DC

## 2015-06-26 NOTE — Progress Notes (Signed)
[id number]    Provider:  Dr Jaynee Eagles Referring Provider: Lucille Passy, MD Primary Care Physician:  Arnette Norris, MD  CC: Parkinsonism  Interval history 06/26/2015:  She is here for follow up of Parkinsonism. She was taken off of the Abilify in September.  Dr. Jacqualine Code. She feels the sinemet kicking in and feels the medicine wearing off and feels it really helps with tremor. She feels more steady now. Discussed parkinsonism vs parkinson's disease. They would like to proceed with DaTScan. Discussed the test, gave literature. Also reviewed MRI of the brain, If DaTScan negative may consider high volume lumbar tap, venticulomegaly.  Interval history 02/22/2015: Patient was not taking medication properly. She was hospitalized and sent to rehab. She got a lot better at physical therapy. Daughter has taken over medication management. Things are going much better. She is still unsteady on her feet, patient's daughter is staying home the next 12 weeks. I discussed that I recommend 24 hour facility care or a caretaker. The carbidopa/levodopa. Discussed separating the pill from protein. Her tone is improved on the Sinemet. Her gait and walking is improved as well. She is still at a slow pace and very unsteady and dazed and confused at times. She was taken off of the Abilify. She has an enlarged pituitary gland and has not had an MRI in years. TSH was significantly elevated 2 months ago, B12 was normal in March. Last HgbA1c 8.7, recommend f/u for diabetes management and she has been recommended to another doctor but unclear, recommend f/u with Dr. Deborra Medina or doctor she was referred to.   HPI: Lori Phillips is a 60 y.o. female here as a referral from Dr. Deborra Medina for Parkinsonism. She has been evaluated by multiple neurologists and apparently at [facility] and been diagnosed with Parkinsonism. She also has a PMHx of UTI, generalized weakness, DM, chronic pain utilizing pain medication, HTN, CAD, anxiety, bipolar  disorder, polysubstabce abuse, a suicide attempt, Fibromyalgia and reported 53-month period when patient was bed bound that daughter thinks may have contributed to symptoms due to disuse. She is Bipolar and is on dopamine-blocking agent and daughter thinks that is may be possible that her parkinsonism started about the same time as the Abilify.    She feels she has seen improvement recently. She is moving better. She has had multiple medication changes and she feels like she is more mobile. Neurontin and Depakote were managed. Her medications were decreased. Her hgba1c is better controlled. She is participating in physical therapy. She has memory loss. She has been diagnosed with parkinsonism. She was in bed for about 8 months starting last summer. She started going to see a doctor about back pain, it helped but after that it didn't and she was in bed because of pain. She started getting out of bed when she went to the rehab center in June. She started getting physical therapy which helped. She has home health care. She is getting occupational therapy and speech therapy. She still feels weak but she is getting better. She is more alert than what she had been as well. Continuously improving. Her tremor is better since changing the Depakote and starting the sinemet. Her sinemet has been increased. Volume of voice is stable. Some drooling on the left side of the face. The shuffling gait is improving. She can take bigger steps when she is told. Smell and taste is good. She is on Abilify. Tried Seroquel in the past. She has depression, memory changes. No hallucinations.  She doesn't feel the Sinemet kick in. The tremors were in both hands. Takes the Sinemet 3x a day and feels the medication wearing off. She knows it is time and needs her sinemet. Tremor is also in the hands and legs. When she tries to take a step her legs tremble.Symptoms started around the time she started taking the Abilify.   Reviewed notes, labs  and imaging from outside physicians, which showed:  CT of the head showed non-specific white matter changes and atrophy which I think is more advanced than stated age. Personally reviewed.  Hemoglobin A1c 7.3, TSH elevated with normal free T4, CMP unremarkable, B12 was 979 but was 292 a year ago  Review of Systems: Patient complains of symptoms per HPI as well as the following symptoms: Activity change, appetite change, chills, fatigue, cold intolerance, flushing, black stools, diarrhea, constant valves, leg swelling, incontinence of bladder, urine decreased, joint pain, apnea, frequently awakening, daytime sleepiness, joint pain, back pain, aching muscles, walking difficulty, neck stiffness, memory loss, speech difficulty, weakness, tremors, facial drooping, confusion, decreased concentration, depression . Pertinent negatives per HPI. All others negative.    Social History   Social History  . Marital Status: Single    Spouse Name: N/A  . Number of Children: 3  . Years of Education: 12   Occupational History  . disability  Other   Social History Main Topics  . Smoking status: Never Smoker   . Smokeless tobacco: Never Used  . Alcohol Use: No  . Drug Use: No  . Sexual Activity: Not on file   Other Topics Concern  . Not on file   Social History Narrative   Has a living will- desires CPR but no prolonged life support if futile.   Caffeine use: Drinks 2 L soda per day   Drinks 2 glasses tea/day    Family History  Problem Relation Age of Onset  . Hypertension Mother   . Diabetes Mother   . Bipolar disorder Father   . Colon cancer Paternal Aunt   . Colon cancer Paternal Grandmother   . Thyroid disease Neg Hx   . Hypertension Father     Past Medical History  Diagnosis Date  . CAD (coronary artery disease)     multiple caths  . Diabetes mellitus   . Hyperlipidemia   . Hypertension   . Thyroid disease     hypothyroid  . Low back pain   . Nephrolithiasis   .  Fibromyalgia   . Bipolar disorder ([place])   . Anxiety   . Panic attacks   . Enlarged liver   . Rheumatoid arthritis ([place])   . Enlarged pituitary gland ([place])   . Fibromyalgia   . Scoliosis   . Osteoporosis   . Osteoarthritis   . Kidney stones   . Hypothyroidism   . Allergy   . Parkinson's disease ([place]) 2016    Past Surgical History  Procedure Laterality Date  . Vaginal hysterectomy    . Tonsillectomy    . Colonoscopy  multiple    2005 [place] records not available-polyps x 3  . Cardiac catheterization      x3  . Esophageal dilation    . Kidney stone removal      Current Outpatient Prescriptions  Medication Sig Dispense Refill  . acetaminophen (TYLENOL) 650 MG CR tablet Take 650 mg by mouth 3 (three) times daily as needed for pain.    . B-D UF III MINI PEN NEEDLES 31G X 5 MM  MISC U UTD QID  2  . carbidopa-levodopa (SINEMET IR) 25-100 MG tablet TAKE 1 TABLET BY MOUTH THREE TIMES DAILY 270 tablet 0  . CARTIA XT 180 MG 24 hr capsule TAKE 1 CAPSULE BY MOUTH EVERY DAY 90 capsule 1  . clonazePAM (KLONOPIN) 0.5 MG tablet Take 1 tablet (0.5 mg total) by mouth daily as needed. (Patient taking differently: Take 0.5 mg by mouth daily as needed for anxiety. ) 30 tablet 0  . diazepam (VALIUM) 5 MG tablet Take 2.5 mg by mouth at bedtime.     Marland Kitchen diltiazem (CARDIZEM CD) 180 MG 24 hr capsule Take 180 mg by mouth daily.    . divalproex (DEPAKOTE ER) 500 MG 24 hr tablet Take 1,000 mg by mouth daily.    Marland Kitchen gabapentin (NEURONTIN) 100 MG capsule Take 100-200 mg by mouth 2 (two) times daily. Pt takes one every morning and two every evening.    . insulin aspart (NOVOLOG) 100 UNIT/ML injection Inject 5-7 Units into the skin 2 (two) times daily. Pt uses 5 units in the morning and 7 units every evening.    Marland Kitchen levothyroxine (SYNTHROID, LEVOTHROID) 112 MCG tablet TAKE 1 TABLET(112 MCG) BY MOUTH DAILY 45 tablet 1  . lisinopril (PRINIVIL,ZESTRIL) 5 MG tablet TAKE 1 TABLET BY MOUTH EVERY DAY 30 tablet 2  .  metFORMIN (GLUCOPHAGE) 500 MG tablet Take 1 tablet (500 mg total) by mouth 2 (two) times daily with a meal. 180 tablet 1  . metFORMIN (GLUCOPHAGE) 500 MG tablet TAKE 1 TABLET(500 MG) BY MOUTH TWICE DAILY WITH A MEAL 60 tablet 1  . nystatin cream (MYCOSTATIN) Apply 1 application topically 2 (two) times daily as needed (for sore).    . Oxycodone HCl 10 MG TABS Take 10 mg by mouth 3 (three) times daily as needed (for pain).    . pantoprazole (PROTONIX) 40 MG tablet Take 1 tablet (40 mg total) by mouth daily. 90 tablet 3  . rosuvastatin (CRESTOR) 20 MG tablet TAKE 1 TABLET BY MOUTH DAILY. 30 tablet 11  . vitamin B-12 (CYANOCOBALAMIN) 500 MCG tablet Take 500 mcg by mouth at bedtime.     . [DISCONTINUED] TAZTIA XT 180 MG 24 hr capsule TAKE ONE CAPSULE BY MOUTH EVERY DAY 90 capsule 0   No current facility-administered medications for this visit.    Allergies as of 06/26/2015 - Review Complete 03/26/2015  Allergen Reaction Noted  . Byetta 10 mcg pen [exenatide] Nausea And Vomiting 10/03/2011  . Glyburide Other (See Comments) 08/15/2014  . Seroquel [quetiapine fumarate] Diarrhea, Nausea And Vomiting, and Other (See Comments) 08/15/2014    Vitals: There were no vitals taken for this visit. Last Weight:  Wt Readings from Last 1 Encounters:  03/26/15 143 lb 4 oz (64.978 kg)   Last Height:   Ht Readings from Last 1 Encounters:  02/22/15 5\' 4"  (1.626 m)    Cranial Nerves:  The pupils are equal, round, and reactive to light. Visual fields are full to finger confrontation. Extraocular movements are intact. Trigeminal sensation is intact and the muscles of mastication are normal. The face is symmetric. The palate elevates in the midline. Hearing intact. Voice is normal. Shoulder shrug is normal. The tongue has normal motion without fasciculations.   Coordination:  Slowed but not dysmetric. Some mild bradykinesia on finger tapping bilaterally  Gait:  Has to use a walker. multi-step turn.  Short strides.  Motor Observation: Mild postural tremor.  No resting tremor.    Tone:  Some very mild cogwheeling  of the right arm with facilitation  Posture:  Stooped   Strength: Mild hip flexion weakness otherwise strength is V/V in the upper and lower limbs.    Sensation: intact to LT   Reflex Exam:  DTR's:  Deep tendon reflexes are brisk in the uppers and 1+ lower extremities  Toes:  The toes are downgoing bilaterally.  Clonus:  Clonus is absent.      Assessment/Plan: ASHBY LIFF is a 60 y.o. female here as a referral from Dr. Deborra Medina for Parkinsonism. She has been evaluated by multiple neurologists and apparently at [facility] and been diagnosed with Parkinsonism. She also has a PMHx of UTI, generalized weakness, DM, chronic pain utilizing pain medication, HTN, CAD, anxiety, bipolar disorder and reported 74-month period when patient was bed bound that daughter thinks may have contributed to symptoms due to disuse. She is Bipolar and was on long-term dopamine-blocking agent and daughter thinks that is may be possible that her parkinsonism started about the same time as the Abilify, unsure.   I suspect the etiology of her symptoms is multifactorial. She does have parkinsonian symptoms.  There are multiple causes of parkinsonism including dopamine-blocking agents such as the Abilify she was  taking, parkinson's plus disorders (psp, msa and others), lewy-body dementia or other neurodegenerative disorders. I did recommend following up with her psychiatrist to see if the Abilify can be changed but it appears she has a long history of mood disorder and has tried multiple medications already.   She is on a lot of medications that can cause sedation such as clonazepam and diazepam, these can cause patient's sedation and "fogginess" that daughter c/o of. Recommend f/u with psychiatry. As well as oxycodone. Recommend decreasing any medications that can cause  sedation. Also with uncontrolled diabetes.  Continue Current medications, increase sinemet but 4 times a day  Order a DaT scan. If negative will try high volume lumbar tap.   Will order MRi of the brain -  Abnormal MRI scan of the brain showing moderate degree of generalized cerebral atrophy. Thin sections through the pituitary do not show enlargement or definite adenoma. Ventriculomegaly with assymmetric lateral ventricles.  Addendum 1128: Records from [facility] received. Carotid Dopplers were performed and impression on the left no hemodynamically significant stenosis. On the right no hemodynamically significant stenosis. MRI of the cervical spine showed no focal canal or foraminal stenosis. Normal cord signal. She was seen by Dr. Deborra Medina for extremity weakness and tremors. This is dated 07/06/2014. Extremity started 3 months ago with weakness which was progressively worsening. Patient using a walker with difficulty walking. Left-sided facial droop with drooling. Also slurred speech. She has stopped her aspirin for a week prior to an epidural spinal injection. Tremor in the hands for about 3 months. Exam significant for intact strength, left lower facial droop otherwise normal cranial nerve exam, normal reflexes. The time they performed a brain MRI to evaluate for stroke, carotid ultrasound to evaluate for carotid artery stenosis. The results of the MRI of the brain were not included in these records. A note was made for referral to neurology but no neurology records found in the [facility] transmission.  Sarina Ill, MD  [facility] [address] [place], [place] [address]  Phone [phone] Fax [phone]  A total of 40 minutes was spent face-to-face with this patient. Over half this time was spent on counseling patient on the Parkinsonism diagnosis and different diagnostic and therapeutic options available.

## 2015-06-26 NOTE — Patient Instructions (Addendum)
Remember to drink plenty of fluid, eat healthy meals and do not skip any meals. Try to eat protein with a every meal and eat a healthy snack such as fruit or nuts in between meals. Try to keep a regular sleep-wake schedule and try to exercise daily, particularly in the form of walking, 20-30 minutes a day, if you can.   As far as your medications are concerned, I would like to suggest: Increase Sinemet to 4x a day. Take it 3-4 hours apart.   As far as diagnostic testing: DAT Scan. If negative lumbar puncture for Normal Pressure Hydrocephalus.  I would like to see you back in 3 months, sooner if we need to. Please call us with any interim questions, concerns, problems, updates or refill requests.   Our phone number is [phone]. We also have an after hours call service for urgent matters and there is a physician on-call for urgent questions. For any emergencies you know to call 911 or go to the nearest emergency room

## 2015-06-27 ENCOUNTER — Encounter: Payer: Self-pay | Admitting: Family Medicine

## 2015-06-27 ENCOUNTER — Ambulatory Visit (INDEPENDENT_AMBULATORY_CARE_PROVIDER_SITE_OTHER): Payer: Medicare Other | Admitting: Family Medicine

## 2015-06-27 VITALS — BP 140/88 | HR 60 | Temp 97.5°F | Ht 64.0 in | Wt 142.8 lb

## 2015-06-27 DIAGNOSIS — I10 Essential (primary) hypertension: Secondary | ICD-10-CM

## 2015-06-27 DIAGNOSIS — E1143 Type 2 diabetes mellitus with diabetic autonomic (poly)neuropathy: Secondary | ICD-10-CM | POA: Diagnosis not present

## 2015-06-27 DIAGNOSIS — E1165 Type 2 diabetes mellitus with hyperglycemia: Secondary | ICD-10-CM

## 2015-06-27 DIAGNOSIS — IMO0002 Reserved for concepts with insufficient information to code with codable children: Secondary | ICD-10-CM

## 2015-06-27 DIAGNOSIS — Z86010 Personal history of colonic polyps: Secondary | ICD-10-CM | POA: Diagnosis not present

## 2015-06-27 DIAGNOSIS — G218 Other secondary parkinsonism: Secondary | ICD-10-CM | POA: Diagnosis not present

## 2015-06-27 DIAGNOSIS — F411 Generalized anxiety disorder: Secondary | ICD-10-CM | POA: Diagnosis not present

## 2015-06-27 DIAGNOSIS — Z0000 Encounter for general adult medical examination without abnormal findings: Secondary | ICD-10-CM | POA: Insufficient documentation

## 2015-06-27 DIAGNOSIS — E038 Other specified hypothyroidism: Secondary | ICD-10-CM

## 2015-06-27 DIAGNOSIS — F3132 Bipolar disorder, current episode depressed, moderate: Secondary | ICD-10-CM

## 2015-06-27 DIAGNOSIS — Z01419 Encounter for gynecological examination (general) (routine) without abnormal findings: Secondary | ICD-10-CM | POA: Insufficient documentation

## 2015-06-27 DIAGNOSIS — G894 Chronic pain syndrome: Secondary | ICD-10-CM | POA: Diagnosis not present

## 2015-06-27 LAB — TSH: TSH: 0.2 u[IU]/mL — AB (ref 0.35–4.50)

## 2015-06-27 LAB — T4, FREE: FREE T4: 1.31 ng/dL (ref 0.60–1.60)

## 2015-06-27 LAB — HEMOGLOBIN A1C: Hgb A1c MFr Bld: 6.4 % (ref 4.6–6.5)

## 2015-06-27 NOTE — Assessment & Plan Note (Signed)
The patients weight, height, BMI and visual acuity have been recorded in the chart.  Cognitive function assessed.   I have made referrals, counseling and provided education to the patient based review of the above and I have provided the pt with a written personalized care plan for preventive services.  

## 2015-06-27 NOTE — Progress Notes (Signed)
Pre visit review using our clinic review tool, if applicable. No additional management support is needed unless otherwise documented below in the visit note. 

## 2015-06-27 NOTE — Assessment & Plan Note (Signed)
Reviewed preventive care protocols, scheduled due services, and updated immunizations Discussed nutrition, exercise, diet, and healthy lifestyle.  

## 2015-06-27 NOTE — Assessment & Plan Note (Addendum)
Stopped lantus due to hypoglycemia.  Will check a1c today- may be able to d/c metformin.  Will defer to Dr. Cruzita Lederer once we have lab results.

## 2015-06-27 NOTE — Progress Notes (Signed)
Subjective:   Patient ID: Lori Phillips, female    DOB: 1955-09-11, 60 y.o.   MRN: [id number]  Lori Phillips is a pleasant 60 y.o. year old female who presents to clinic today with her daughter for Annual Exam , CPX and follow up of chronic medical conditions on 06/27/2015  HPI: I have personally reviewed the Medicare Annual Wellness questionnaire and have noted 1. The patient's medical and social history 2. Their use of alcohol, tobacco or illicit drugs 3. Their current medications and supplements 4. The patient's functional ability including ADL's, fall risks, home safety risks and hearing or visual             impairment. 5. Diet and physical activities 6. Evidence for depression or mood disorders  End of life wishes discussed and updated in Social History.  The roster of all physicians providing medical care to patient - is listed in the CareTeams section of the chart.  Zoster 08/26/13 Pneumovax 08/26/13 Influenza vaccine 01/15/15  Remote h/o hysterectomy Colonoscopy 6/14/13Carlean Purl, 3 year recall. Mammogram 06/29/13  Bipolar disorder- has been controlled Now on depakote. No recent mania and not depressed Dr Karalee Height is the psychiatrist.  Hypothyroidism- followed by Dr. Cruzita Lederer.  Last seen on 01/15/15.  Note reviewed. Lab Results  Component Value Date   TSH 0.18* 03/26/2015  It appears she did not keep follow up lab visit after her synthroid dose was decreased to 112 mcg daily.  DM- followed by Dr. Cruzita Lederer.  Has been improving in recent months.  She is on low dose ACEI. Has only been taking Metformin.  Stopped taking  Lantus in 01/2015 because of hypoglycemia.  Checks FSBS sporadically, has been 115 fasting. No further episodes of hypoglycemia. Lab Results  Component Value Date   HGBA1C 6.6* 03/26/2015   Wt Readings from Last 3 Encounters:  06/27/15 142 lb 12 oz (64.751 kg)  03/26/15 143 lb 4 oz (64.978 kg)  02/22/15 148 lb 12.8 oz (67.495 kg)    Parkinsonism-  followed by neurology, Dr. Jaynee Eagles.  Last seen yesterday 06/26/15- note reviewed. She has been compliant with sineme, dose increased yesterday.  This has helped with balance and tremors.  Also being worked up for NPH as well.  Lab Results  Component Value Date   NA 141 03/26/2015   K 4.1 03/26/2015   CL 101 03/26/2015   CO2 28 03/26/2015   Lab Results  Component Value Date   ALT 4 03/26/2015   AST 10 03/26/2015   ALKPHOS 64 03/26/2015   BILITOT 0.4 03/26/2015   Lab Results  Component Value Date   WBC 9.7 01/17/2015   HGB 13.4 01/17/2015   HCT 40.6 01/17/2015   MCV 88.0 01/17/2015   PLT 147* 01/17/2015   Lab Results  Component Value Date   CHOL 173 05/16/2015   HDL 49.30 05/16/2015   LDLCALC 80 07/05/2013   LDLDIRECT 78.0 05/16/2015   TRIG 322.0* 05/16/2015   CHOLHDL 4 05/16/2015    Current Outpatient Prescriptions on File Prior to Visit  Medication Sig Dispense Refill  . acetaminophen (TYLENOL) 650 MG CR tablet Take 650 mg by mouth 3 (three) times daily as needed for pain.    . B-D UF III MINI PEN NEEDLES 31G X 5 MM MISC U UTD QID  2  . carbidopa-levodopa (SINEMET IR) 25-100 MG tablet Take 1 tablet by mouth 4 (four) times daily. Can take it every 3-4 hours. 360 tablet 12  . CARTIA XT 180 MG  24 hr capsule TAKE 1 CAPSULE BY MOUTH EVERY DAY 90 capsule 1  . clonazePAM (KLONOPIN) 0.5 MG tablet Take 1 tablet (0.5 mg total) by mouth daily as needed. (Patient taking differently: Take 0.5 mg by mouth daily as needed for anxiety. ) 30 tablet 0  . diazepam (VALIUM) 5 MG tablet Take 2.5 mg by mouth at bedtime.     . divalproex (DEPAKOTE ER) 500 MG 24 hr tablet Take 1,000 mg by mouth daily.    Marland Kitchen gabapentin (NEURONTIN) 100 MG capsule Take 100-200 mg by mouth 2 (two) times daily. Pt takes one every morning and two every evening.    Marland Kitchen levothyroxine (SYNTHROID, LEVOTHROID) 112 MCG tablet TAKE 1 TABLET(112 MCG) BY MOUTH DAILY 45 tablet 1  . lisinopril (PRINIVIL,ZESTRIL) 5 MG tablet TAKE 1  TABLET BY MOUTH EVERY DAY 30 tablet 2  . metFORMIN (GLUCOPHAGE) 500 MG tablet TAKE 1 TABLET(500 MG) BY MOUTH TWICE DAILY WITH A MEAL 60 tablet 1  . nystatin cream (MYCOSTATIN) Apply 1 application topically 2 (two) times daily as needed (for sore).    . pantoprazole (PROTONIX) 40 MG tablet Take 1 tablet (40 mg total) by mouth daily. 90 tablet 3  . rosuvastatin (CRESTOR) 20 MG tablet TAKE 1 TABLET BY MOUTH DAILY. 30 tablet 11  . vitamin B-12 (CYANOCOBALAMIN) 500 MCG tablet Take 500 mcg by mouth at bedtime.     . [DISCONTINUED] TAZTIA XT 180 MG 24 hr capsule TAKE ONE CAPSULE BY MOUTH EVERY DAY 90 capsule 0   No current facility-administered medications on file prior to visit.    Allergies  Allergen Reactions  . Byetta 10 Mcg Pen [Exenatide] Nausea And Vomiting  . Glyburide Other (See Comments)    Reaction:  Dizziness and confusion   . Seroquel [Quetiapine Fumarate] Diarrhea, Nausea And Vomiting and Other (See Comments)    Reaction:  Confusion     Past Medical History  Diagnosis Date  . CAD (coronary artery disease)     multiple caths  . Diabetes mellitus   . Hyperlipidemia   . Hypertension   . Thyroid disease     hypothyroid  . Low back pain   . Nephrolithiasis   . Fibromyalgia   . Bipolar disorder ([place])   . Anxiety   . Panic attacks   . Enlarged liver   . Rheumatoid arthritis ([place])   . Enlarged pituitary gland ([place])   . Fibromyalgia   . Scoliosis   . Osteoporosis   . Osteoarthritis   . Kidney stones   . Hypothyroidism   . Allergy   . Parkinson's disease ([place]) 2016    Past Surgical History  Procedure Laterality Date  . Vaginal hysterectomy    . Tonsillectomy    . Colonoscopy  multiple    2005 [place] records not available-polyps x 3  . Cardiac catheterization      x3  . Esophageal dilation    . Kidney stone removal      Family History  Problem Relation Age of Onset  . Hypertension Mother   . Diabetes Mother   . Bipolar disorder Father   . Colon cancer  Paternal Aunt   . Colon cancer Paternal Grandmother   . Thyroid disease Neg Hx   . Hypertension Father     Social History   Social History  . Marital Status: Single    Spouse Name: N/A  . Number of Children: 3  . Years of Education: 12   Occupational History  . disability  Other   Social History Main Topics  . Smoking status: Never Smoker   . Smokeless tobacco: Never Used  . Alcohol Use: No  . Drug Use: No  . Sexual Activity: Not on file   Other Topics Concern  . Not on file   Social History Narrative   Has a living will- desires CPR but no prolonged life support if futile.   Caffeine use: Drinks 2 L soda per day   Drinks 2 glasses tea/day   The PMH, PSH, Social History, Family History, Medications, and allergies have been reviewed in [facility], and have been updated if relevant.    Review of Systems  HENT: Negative.   Eyes: Negative.   Respiratory: Negative.   Cardiovascular: Negative.   Gastrointestinal: Negative.   Endocrine: Negative.   Genitourinary: Negative.   Musculoskeletal: Negative.   Skin: Negative.   Allergic/Immunologic: Negative.   Neurological: Positive for weakness. Negative for tremors, syncope and speech difficulty.       Objective:    BP 140/88 mmHg  Pulse 60  Temp(Src) 97.5 F (36.4 C) (Oral)  Ht 5\' 4"  (1.626 m)  Wt 142 lb 12 oz (64.751 kg)  BMI 24.49 kg/m2  SpO2 99%   Physical Exam    General:  Well-developed,well-nourished,in no acute distress; alert,appropriate and cooperative throughout examination Head:  normocephalic and atraumatic.   Eyes:  vision grossly intact, pupils equal, pupils round, and pupils reactive to light.   Ears:  R ear normal and L ear normal.   Nose:  no external deformity.   Mouth:  good dentition.   Neck:  No deformities, masses, or tenderness noted. Breasts:  No mass, nodules, thickening, tenderness, bulging, retraction, inflamation, nipple discharge or skin changes noted.   Lungs:  Normal respiratory  effort, chest expands symmetrically. Lungs are clear to auscultation, no crackles or wheezes. Heart:  Normal rate and regular rhythm. S1 and S2 normal without gallop, murmur, click, rub or other extra sounds. Abdomen:  Bowel sounds positive,abdomen soft and non-tender without masses, organomegaly or hernias noted Msk:  No deformity or scoliosis noted of thoracic or lumbar spine.   Extremities:  No clubbing, cyanosis, edema, or deformity noted with normal full range of motion of all joints.   Neurologic:  alert & oriented X3 and gait normal.   Skin:  Intact without suspicious lesions or rashes Cervical Nodes:  No lymphadenopathy noted Axillary Nodes:  No palpable lymphadenopathy Psych:  Cognition and judgment appear intact. Alert and cooperative with normal attention span and concentration. No apparent delusions, illusions, hallucinations      Assessment & Plan:   Medicare annual wellness visit, subsequent  Anxiety state  Bipolar affective disorder, currently depressed, moderate (HCC)  Chronic pain syndrome  Other secondary parkinsonism (HCC)  Type 2 diabetes, uncontrolled, with gastroparesis ([place])  Other specified hypothyroidism  Essential hypertension  Well woman exam No Follow-up on file.

## 2015-06-27 NOTE — Assessment & Plan Note (Signed)
TSH low when last checked. Advised follow up labs and to follow up with Dr. Cruzita Lederer. The patient indicates understanding of these issues and agrees with the plan.

## 2015-06-27 NOTE — Patient Instructions (Signed)
Great to see you. Please stop by to see Lori Phillips on your way out. Please call to schedule your mammogram.

## 2015-06-27 NOTE — Assessment & Plan Note (Signed)
Reasonable control. No changes made. 

## 2015-06-27 NOTE — Assessment & Plan Note (Signed)
Followed by neuro. No changes made to rxs today.

## 2015-06-28 ENCOUNTER — Telehealth: Payer: Self-pay | Admitting: *Deleted

## 2015-06-28 ENCOUNTER — Telehealth: Payer: Self-pay | Admitting: Neurology

## 2015-06-28 DIAGNOSIS — G20 Parkinson's disease: Secondary | ICD-10-CM

## 2015-06-28 DIAGNOSIS — G20C Parkinsonism, unspecified: Secondary | ICD-10-CM

## 2015-06-28 MED ORDER — LEVOTHYROXINE SODIUM 100 MCG PO TABS: 100.0000 ug | ORAL_TABLET | Freq: Every day | ORAL | Status: DC

## 2015-06-28 NOTE — Telephone Encounter (Signed)
Lori Phillips - I ordered a nuclear medicine DaT scan for this patient. I think they have to go to [facility], we don't do it here. Can you take care of this? thanks

## 2015-06-28 NOTE — Telephone Encounter (Signed)
OK, continue only on Metformin.

## 2015-06-28 NOTE — Telephone Encounter (Signed)
Called pt with lab results. Pt stated her b/s has been staying low. 100-115. She is not taking her insulin only the metformin. When she took the insulin her blood sugar dropped. Please advise further.

## 2015-06-29 NOTE — Telephone Encounter (Signed)
Per Dr Jaynee Eagles- can wait until Monday

## 2015-06-29 NOTE — Telephone Encounter (Signed)
Called pt and advised her per Dr Gherghe's message. Pt voiced understanding.  

## 2015-07-02 ENCOUNTER — Other Ambulatory Visit: Payer: Self-pay | Admitting: *Deleted

## 2015-07-02 NOTE — Telephone Encounter (Signed)
Patient is having her PET Scan at [facility] March 21 st at 11:30. Patient preferred [facility] .  Patient has to Be NPO six hours before . I spoke to patient relayed all her directions and she understood.  Spoke to Bear at [employer] no pre cert needed.

## 2015-07-02 NOTE — Telephone Encounter (Signed)
DaT scan sorry, Larey Seat knows how to do this.

## 2015-07-02 NOTE — Telephone Encounter (Signed)
Hi Dr. Jaynee Eagles By Looking at your order its says NM PET Metabolic Brain . I have always scheduled PET Scan at [facility] . Please advise me ?

## 2015-07-02 NOTE — Telephone Encounter (Signed)
Are we sure this is a DaT scan? Didn;t realize we could do those here. I don't want an FDG glucose scan, I would like a DaT scan. Let me knwo thanks.

## 2015-07-03 NOTE — Addendum Note (Signed)
Addended by: Hope Pigeon on: 07/03/2015 05:01 PM   Modules accepted: Orders

## 2015-07-10 ENCOUNTER — Ambulatory Visit (HOSPITAL_COMMUNITY): Payer: Medicare Other

## 2015-07-17 ENCOUNTER — Ambulatory Visit (INDEPENDENT_AMBULATORY_CARE_PROVIDER_SITE_OTHER): Payer: Medicare Other | Admitting: Internal Medicine

## 2015-07-17 ENCOUNTER — Encounter: Payer: Self-pay | Admitting: Internal Medicine

## 2015-07-17 ENCOUNTER — Telehealth: Payer: Self-pay

## 2015-07-17 VITALS — BP 118/64 | HR 63 | Temp 98.1°F | Resp 12 | Wt 144.0 lb

## 2015-07-17 DIAGNOSIS — E1143 Type 2 diabetes mellitus with diabetic autonomic (poly)neuropathy: Secondary | ICD-10-CM | POA: Diagnosis not present

## 2015-07-17 DIAGNOSIS — E1165 Type 2 diabetes mellitus with hyperglycemia: Secondary | ICD-10-CM | POA: Diagnosis not present

## 2015-07-17 DIAGNOSIS — E038 Other specified hypothyroidism: Secondary | ICD-10-CM

## 2015-07-17 DIAGNOSIS — IMO0002 Reserved for concepts with insufficient information to code with codable children: Secondary | ICD-10-CM

## 2015-07-17 NOTE — Progress Notes (Signed)
Patient ID: Lori Phillips, female   DOB: 1955/12/11, 60 y.o.   MRN: [id number]  HPI: Lori Phillips is a 60 y.o.-year-old female, returning for f/u for DM2, dx 2009, insulin-dependent since dx, uncontrolled, with complications (?CAD, gastroparesis, PN) and hypothyroidism. Last visit 6 mo.  DM2:  Last hemoglobin A1c was: Lab Results  Component Value Date   HGBA1C 6.4 06/27/2015   HGBA1C 6.6* 03/26/2015   HGBA1C 7.3* 11/29/2014   Pt is on a regimen of: - Metformin 500 mg 2x a day She stopped NovoLog 5-7 units before meals. Off Levemir 41 >> 30 units qam >> stopped as she cannot give herself the injection  (did not have a pen as she has tremors) She is off Victoza 1.8 mg and Glipizide-Metformin.  Pt used to check sugars 1-3x day: - am: 100-125 >> 57-146 (75-104) >> 87-106 >> 90-130s >> 100-110 -  2h after b'fast: 73, 96, 160 >> n/c >> 148 100-110 - lunch: 91-135 >> 100-150? >>  - 2h after lunch: 113, 138 >> n/c - dinner: 83-198 (100-130) >> 202-220 >> 229 (fast food) >> n/c - 2h after dinner: 117-192 >> n/c - bedtime: 108-157 >> 220-230 >> n/c >> 100-110 - nighttime: 115-175 >> n/c Lowest sugar was 70s >> 90 >> 100; she has hypoglycemia awareness 60. Highest sugar was 200s >> 100s.   She saw nutrition in the past.   - has mild CKD, last BUN/creatinine:  Lab Results  Component Value Date   BUN 17 03/26/2015   CREATININE 1.10 03/26/2015  On Lisinopril. - last set of lipids: Lab Results  Component Value Date   CHOL 173 05/16/2015   HDL 49.30 05/16/2015   LDLCALC 80 07/05/2013   LDLDIRECT 78.0 05/16/2015   TRIG 322.0* 05/16/2015   CHOLHDL 4 05/16/2015  On Crestor 20. - last eye exam was in 10/2014 . No DR. Had cataract sx bilat.  - + numbness and tingling in her feet. Last foot exam: by PCP: 07/06/2013.  Hypothyroidism: - Since "~20 years ago" - She is on levothyroxine taken:  - in am - fasting - with water - separated from b'fast by >30 min - moved PPI at night -  no Ca, Fe, MVI  Last TFTs: Lab Results  Component Value Date   TSH 0.20* 06/27/2015   TSH 0.18* 03/26/2015   TSH 14.95* 11/29/2014   TSH 0.03* 09/26/2014   TSH 0.12* 07/04/2014   FREET4 1.31 06/27/2015   FREET4 0.72 11/29/2014   FREET4 1.74* 09/26/2014   FREET4 1.35 07/04/2014   FREET4 1.44 08/26/2013   LT4 dose was decreased from 137 to 125 to 112 to 100 mcg daily (last change 06/28/2015).  ROS: Constitutional: no weight gain/loss, no fatigue, no subjective hyperthermia/hypothermia, + nocturia, + poor sleep Eyes: + blurry vision, no xerophthalmia ENT: no sore throat, no nodules palpated in throat, no dysphagia/odynophagia, no hoarseness Cardiovascular: no CP/SOB/palpitations/leg swelling Respiratory: no cough/SOB Gastrointestinal: no N/V/+ D/n0 C/+ heartburn Musculoskeletal: no muscle/+ joint aches Skin: no rashes Neurological: no tremors/numbness/tingling/dizziness  I reviewed pt's medications, allergies, PMH, social hx, family hx, and changes were documented in the history of present illness. Otherwise, unchanged from my initial visit note. Stopped Abilify >> movement worse.   Past Medical History  Diagnosis Date  . CAD (coronary artery disease)     multiple caths  . Diabetes mellitus   . Hyperlipidemia   . Hypertension   . Thyroid disease     hypothyroid  . Low back pain   .  Nephrolithiasis   . Fibromyalgia   . Bipolar disorder ([place])   . Anxiety   . Panic attacks   . Enlarged liver   . Rheumatoid arthritis ([place])   . Enlarged pituitary gland ([place])   . Fibromyalgia   . Scoliosis   . Osteoporosis   . Osteoarthritis   . Kidney stones   . Hypothyroidism   . Allergy   . Parkinson's disease ([place]) 2016   Past Surgical History  Procedure Laterality Date  . Vaginal hysterectomy    . Tonsillectomy    . Colonoscopy  multiple    2005 [place] records not available-polyps x 3  . Cardiac catheterization      x3  . Esophageal dilation    . Kidney stone  removal     Social History   Social History  . Marital Status: Single    Spouse Name: N/A  . Number of Children: 3  . Years of Education: 12   Occupational History  . disability  Other   Social History Main Topics  . Smoking status: Never Smoker   . Smokeless tobacco: Never Used  . Alcohol Use: No  . Drug Use: No  . Sexual Activity: Not on file   Other Topics Concern  . Not on file   Social History Narrative   Has a living will- desires CPR but no prolonged life support if futile.   Caffeine use: Drinks 2 L soda per day   Drinks 2 glasses tea/day   Current Outpatient Prescriptions on File Prior to Visit  Medication Sig Dispense Refill  . acetaminophen (TYLENOL) 650 MG CR tablet Take 650 mg by mouth 3 (three) times daily as needed for pain.    . carbidopa-levodopa (SINEMET IR) 25-100 MG tablet Take 1 tablet by mouth 4 (four) times daily. Can take it every 3-4 hours. 360 tablet 12  . CARTIA XT 180 MG 24 hr capsule TAKE 1 CAPSULE BY MOUTH EVERY DAY 90 capsule 1  . clonazePAM (KLONOPIN) 0.5 MG tablet Take 1 tablet (0.5 mg total) by mouth daily as needed. (Patient taking differently: Take 0.5 mg by mouth daily as needed for anxiety. ) 30 tablet 0  . diazepam (VALIUM) 5 MG tablet Take 2.5 mg by mouth at bedtime.     . divalproex (DEPAKOTE ER) 500 MG 24 hr tablet Take 1,000 mg by mouth daily.    Marland Kitchen gabapentin (NEURONTIN) 100 MG capsule Take 100-200 mg by mouth 2 (two) times daily. Pt takes one every morning and two every evening.    Marland Kitchen levothyroxine (SYNTHROID, LEVOTHROID) 100 MCG tablet Take 1 tablet (100 mcg total) by mouth daily. 30 tablet 2  . lisinopril (PRINIVIL,ZESTRIL) 5 MG tablet TAKE 1 TABLET BY MOUTH EVERY DAY 30 tablet 2  . metFORMIN (GLUCOPHAGE) 500 MG tablet TAKE 1 TABLET(500 MG) BY MOUTH TWICE DAILY WITH A MEAL 60 tablet 1  . nystatin cream (MYCOSTATIN) Apply 1 application topically 2 (two) times daily as needed (for sore).    . pantoprazole (PROTONIX) 40 MG tablet Take  1 tablet (40 mg total) by mouth daily. 90 tablet 3  . rosuvastatin (CRESTOR) 20 MG tablet TAKE 1 TABLET BY MOUTH DAILY. 30 tablet 11  . vitamin B-12 (CYANOCOBALAMIN) 500 MCG tablet Take 500 mcg by mouth at bedtime.     . [DISCONTINUED] TAZTIA XT 180 MG 24 hr capsule TAKE ONE CAPSULE BY MOUTH EVERY DAY 90 capsule 0   No current facility-administered medications on file prior to visit.  Allergies  Allergen Reactions  . Byetta 10 Mcg Pen [Exenatide] Nausea And Vomiting  . Glyburide Other (See Comments)    Reaction:  Dizziness and confusion   . Seroquel [Quetiapine Fumarate] Diarrhea, Nausea And Vomiting and Other (See Comments)    Reaction:  Confusion    Family History  Problem Relation Age of Onset  . Hypertension Mother   . Diabetes Mother   . Bipolar disorder Father   . Colon cancer Paternal Aunt   . Colon cancer Paternal Grandmother   . Thyroid disease Neg Hx   . Hypertension Father    PE: BP 118/64 mmHg  Pulse 63  Temp(Src) 98.1 F (36.7 C) (Oral)  Resp 12  Wt 144 lb (65.318 kg)  SpO2 97% Body mass index is 24.71 kg/(m^2). Wt Readings from Last 3 Encounters:  07/17/15 144 lb (65.318 kg)  06/27/15 142 lb 12 oz (64.751 kg)  03/26/15 143 lb 4 oz (64.978 kg)   Constitutional: normal weight, in NAD, walks with walker Eyes: PERRLA, EOMI, no exophthalmos ENT: moist mucous membranes, no thyromegaly, no cervical lymphadenopathy Cardiovascular: RRR, No MRG Respiratory: CTA B Gastrointestinal: abdomen soft, NT, ND, BS+ Musculoskeletal: no deformities, strength intact in all 4 Skin: moist, warm, no rashes Neurological: + tremor with outstretched hands, DTR normal in all 4  ASSESSMENT: 1. DM2, insulin-dependent, uncontrolled, with complications - ?CAD - had CP in the past >> has 2 catetherizations: 2000 and 2001 - attributed CP to anxiety - PN - gastroparesis  2. Hypothyroidism  PLAN:  1. Patient with long-standing, uncontrolled diabetes, off basal and mealtime insulin  now (!), with improved sugars after started Metformin. All sugars are at goal now. Last hBA1c was great, at 6.4%. - I suggested to:  Patient Instructions  Please continue current diabetes regimen: - Metformin 500 mg 2x a day   Take the thyroid hormone 100 mcg daily every day, with water, >30 minutes before breakfast, separated by >4 hours from acid reflux medications, calcium, iron, multivitamins.  Please return in 4 month with your sugar log.  - continue checking sugars at different times of the day - check 1x a day - She is up-to-date with eye exams - Return to clinic in 4 mo with sugar log   2. Hypothyroidism - her last TSH was low >> we decreased LT4 to 100 mcg 3 weeks ago - She has uncontrolled hypothyroidism, on levothyroxine - with low TSH at last check  after starting taking her levothyroxine correctly (moved PPI from am to HS)We discussed to take the thyroid hormone every day, with water, >30 minutes before breakfast, separated by >4 hours from acid reflux medications, calcium, iron, multivitamins. She is now taking it correctly - We'll check her TFTs in 3 weeks

## 2015-07-17 NOTE — Patient Instructions (Signed)
Please continue current diabetes regimen: - Metformin 500 mg 2x a day   Take the thyroid hormone 100 mcg daily every day, with water, >30 minutes before breakfast, separated by >4 hours from acid reflux medications, calcium, iron, multivitamins.  Please return in 4 month with your sugar log.

## 2015-07-17 NOTE — Telephone Encounter (Signed)
Lori Phillips [phone]  Lori dropped off Disability Parking Placard to be filled out. Call when ready to be picked up. Placed in RX Box up front.

## 2015-07-18 NOTE — Telephone Encounter (Signed)
Spoke to pt and informed her form is available for pickup from the front desk 

## 2015-07-18 NOTE — Telephone Encounter (Signed)
Form signed and in [place] box for completion.

## 2015-08-02 ENCOUNTER — Ambulatory Visit: Payer: Medicare Other | Admitting: Internal Medicine

## 2015-08-07 DIAGNOSIS — G219 Secondary parkinsonism, unspecified: Secondary | ICD-10-CM | POA: Diagnosis not present

## 2015-08-09 DIAGNOSIS — R197 Diarrhea, unspecified: Secondary | ICD-10-CM | POA: Diagnosis not present

## 2015-08-09 DIAGNOSIS — Z86010 Personal history of colonic polyps: Secondary | ICD-10-CM | POA: Diagnosis not present

## 2015-08-10 DIAGNOSIS — R197 Diarrhea, unspecified: Secondary | ICD-10-CM | POA: Diagnosis not present

## 2015-08-20 ENCOUNTER — Other Ambulatory Visit: Payer: Self-pay | Admitting: Family Medicine

## 2015-08-20 DIAGNOSIS — Z1231 Encounter for screening mammogram for malignant neoplasm of breast: Secondary | ICD-10-CM

## 2015-08-22 ENCOUNTER — Other Ambulatory Visit: Payer: Self-pay | Admitting: Family Medicine

## 2015-08-22 ENCOUNTER — Ambulatory Visit
Admission: RE | Admit: 2015-08-22 | Discharge: 2015-08-22 | Disposition: A | Discharge status: Home / Self Care | Payer: Medicare Other | Source: Ambulatory Visit | Attending: Family Medicine | Admitting: Family Medicine

## 2015-08-22 DIAGNOSIS — Z1231 Encounter for screening mammogram for malignant neoplasm of breast: Secondary | ICD-10-CM | POA: Insufficient documentation

## 2015-08-23 ENCOUNTER — Telehealth: Payer: Self-pay | Admitting: Neurology

## 2015-08-23 NOTE — Telephone Encounter (Signed)
Lori Phillips, please call patient's daughter and tell ehr that patient's DAT scan was normal. This indicates she does not have Parkinson's disease. Thanks.

## 2015-08-24 NOTE — Telephone Encounter (Signed)
Called and spoke to [place] (daughter) who is patient's POA. Relayed that DAT scan normal and indicates that pt does not have Parkinson's disease. She verbalized understanding.

## 2015-09-17 DIAGNOSIS — J449 Chronic obstructive pulmonary disease, unspecified: Secondary | ICD-10-CM | POA: Diagnosis not present

## 2015-10-03 ENCOUNTER — Ambulatory Visit: Payer: Medicare Other | Admitting: Neurology

## 2015-10-09 ENCOUNTER — Ambulatory Visit (INDEPENDENT_AMBULATORY_CARE_PROVIDER_SITE_OTHER): Payer: Medicare Other | Admitting: Neurology

## 2015-10-09 VITALS — BP 141/65 | HR 48 | Ht 64.0 in | Wt 143.0 lb

## 2015-10-09 DIAGNOSIS — G20 Parkinson's disease: Secondary | ICD-10-CM | POA: Diagnosis not present

## 2015-10-09 DIAGNOSIS — G20C Parkinsonism, unspecified: Secondary | ICD-10-CM

## 2015-10-09 NOTE — Progress Notes (Signed)
[facility]    Provider:  Dr Jaynee Eagles Referring Provider: Lucille Passy, MD Primary Care Physician:  Arnette Norris, MD  CC: Parkinsonism  Interval history 10/09/2015: DaT scan was negative making parkinson's disease or related parkinson's disorders of the basal ganglia unlikely. Parkinsonism can have many causes but patient was on long-term dopamine blocking agents which may be the cause. Physical therapy helped. But she is not getting up and not walking a lot due to daughter not being home. She feels like she is getting benefit from the carbidopa/levodopa. Memory 'comes and goes" She takes carbidopa/levodopa 4x a day, no side effects. She is off of the Abilify. She is here with [name] ([name] is her other daughter). Patient and daughter deny significant memory loss. They decline MMSE or MoCA.   Interval history 06/26/2015: She is here for follow up of Parkinsonism. She was taken off of the Abilify in September. Dr. Jacqualine Code. She feels the sinemet kicking in and feels the medicine wearing off and feels it really helps with tremor. She feels more steady now. Discussed parkinsonism vs parkinson's disease. They would like to proceed with DaTScan. Discussed the test, gave literature. Also reviewed MRI of the brain, If DaTScan negative may consider high volume lumbar tap, venticulomegaly.  Interval history 02/22/2015: Patient was not taking medication properly. She was hospitalized and sent to rehab. She got a lot better at physical therapy. Daughter has taken over medication management. Things are going much better. She is still unsteady on her feet, patient's daughter is staying home the next 12 weeks. I discussed that I recommend 24 hour facility care or a caretaker. The carbidopa/levodopa. Discussed separating the pill from protein. Her tone is improved on the Sinemet. Her gait and walking is improved as well. She is still at a slow pace and very unsteady and dazed and confused at times. She  was taken off of the Abilify. She has an enlarged pituitary gland and has not had an MRI in years. TSH was significantly elevated 2 months ago, B12 was normal in March. Last HgbA1c 8.7, recommend f/u for diabetes management and she has been recommended to another doctor but unclear, recommend f/u with Dr. Deborra Medina or doctor she was referred to.   HPI: Lori Phillips is a 60 y.o. female here as a referral from Dr. Deborra Medina for Parkinsonism. She has been evaluated by multiple neurologists and apparently at [facility] and been diagnosed with Parkinsonism. She also has a PMHx of UTI, generalized weakness, DM, chronic pain utilizing pain medication, HTN, CAD, anxiety, bipolar disorder, polysubstabce abuse, a suicide attempt, Fibromyalgia and reported 40-month period when patient was bed bound that daughter thinks may have contributed to symptoms due to disuse. She is Bipolar and is on dopamine-blocking agent and daughter thinks that is may be possible that her parkinsonism started about the same time as the Abilify.    She feels she has seen improvement recently. She is moving better. She has had multiple medication changes and she feels like she is more mobile. Neurontin and Depakote were managed. Her medications were decreased. Her hgba1c is better controlled. She is participating in physical therapy. She has memory loss. She has been diagnosed with parkinsonism. She was in bed for about 8 months starting last summer. She started going to see a doctor about back pain, it helped but after that it didn't and she was in bed because of pain. She started getting out of bed when she went to the rehab center in June.  She started getting physical therapy which helped. She has home health care. She is getting occupational therapy and speech therapy. She still feels weak but she is getting better. She is more alert than what she had been as well. Continuously improving. Her tremor is better since changing the Depakote and starting the  sinemet. Her sinemet has been increased. Volume of voice is stable. Some drooling on the left side of the face. The shuffling gait is improving. She can take bigger steps when she is told. Smell and taste is good. She is on Abilify. Tried Seroquel in the past. She has depression, memory changes. No hallucinations. She doesn't feel the Sinemet kick in. The tremors were in both hands. Takes the Sinemet 3x a day and feels the medication wearing off. She knows it is time and needs her sinemet. Tremor is also in the hands and legs. When she tries to take a step her legs tremble.Symptoms started around the time she started taking the Abilify.   Reviewed notes, labs and imaging from outside physicians, which showed:  CT of the head showed non-specific white matter changes and atrophy which I think is more advanced than stated age. Personally reviewed.  Hemoglobin A1c 7.3, TSH elevated with normal free T4, CMP unremarkable, B12 was 979 but was 292 a year ago  Review of Systems: Patient complains of symptoms per HPI as well as the following symptoms:  fatigue, cold intolerance, flushing, diarrhea,  incontinence of bladder, insomnia, frequently awakening, daytime sleepiness, joint pain, back pain, aching muscles, walking difficulty, memory loss, agitation, , decreased concentration, depression . Pertinent negatives per HPI. All others negative.   Social History   Social History  . Marital Status: Single    Spouse Name: N/A  . Number of Children: 3  . Years of Education: 12   Occupational History  . disability  Other   Social History Main Topics  . Smoking status: Never Smoker   . Smokeless tobacco: Never Used  . Alcohol Use: No  . Drug Use: No  . Sexual Activity: Not on file   Other Topics Concern  . Not on file   Social History Narrative   Has a living will- desires CPR but no prolonged life support if futile.   Caffeine use: Drinks 2 L soda per day   Drinks 2 glasses tea/day    Family  History  Problem Relation Age of Onset  . Hypertension Mother   . Diabetes Mother   . Bipolar disorder Father   . Hypertension Father   . Colon cancer Paternal Aunt   . Colon cancer Paternal Grandmother   . Thyroid disease Neg Hx   . Breast cancer Neg Hx     Past Medical History  Diagnosis Date  . CAD (coronary artery disease)     multiple caths  . Diabetes mellitus   . Hyperlipidemia   . Hypertension   . Thyroid disease     hypothyroid  . Low back pain   . Nephrolithiasis   . Fibromyalgia   . Bipolar disorder ([place])   . Anxiety   . Panic attacks   . Enlarged liver   . Rheumatoid arthritis ([place])   . Enlarged pituitary gland ([place])   . Fibromyalgia   . Scoliosis   . Osteoporosis   . Osteoarthritis   . Kidney stones   . Hypothyroidism   . Allergy   . Parkinson's disease ([place]) 2016    Past Surgical History  Procedure Laterality Date  .  Vaginal hysterectomy    . Tonsillectomy    . Colonoscopy  multiple    2005 [place] records not available-polyps x 3  . Cardiac catheterization      x3  . Esophageal dilation    . Kidney stone removal      Current Outpatient Prescriptions  Medication Sig Dispense Refill  . acetaminophen (TYLENOL) 650 MG CR tablet Take 650 mg by mouth 3 (three) times daily as needed for pain.    Marland Kitchen BIOTIN PO Take 1 tablet by mouth daily.    . carbidopa-levodopa (SINEMET IR) 25-100 MG tablet Take 1 tablet by mouth 4 (four) times daily. Can take it every 3-4 hours. 360 tablet 12  . CARTIA XT 180 MG 24 hr capsule TAKE 1 CAPSULE BY MOUTH EVERY DAY 90 capsule 1  . clonazePAM (KLONOPIN) 0.5 MG tablet Take 1 tablet (0.5 mg total) by mouth daily as needed. (Patient taking differently: Take 0.5 mg by mouth daily as needed for anxiety. ) 30 tablet 0  . diazepam (VALIUM) 5 MG tablet Take 2.5 mg by mouth at bedtime.     . divalproex (DEPAKOTE ER) 500 MG 24 hr tablet Take 1,000 mg by mouth daily.    Marland Kitchen gabapentin (NEURONTIN) 100 MG capsule Take 100-200 mg by  mouth 2 (two) times daily. Pt takes one every morning and two every evening.    Marland Kitchen levothyroxine (SYNTHROID, LEVOTHROID) 100 MCG tablet Take 1 tablet (100 mcg total) by mouth daily. 30 tablet 2  . lisinopril (PRINIVIL,ZESTRIL) 5 MG tablet TAKE 1 TABLET BY MOUTH EVERY DAY 30 tablet 2  . metFORMIN (GLUCOPHAGE) 500 MG tablet TAKE 1 TABLET(500 MG) BY MOUTH TWICE DAILY WITH A MEAL 60 tablet 1  . nystatin cream (MYCOSTATIN) Apply 1 application topically 2 (two) times daily as needed (for sore).    . pantoprazole (PROTONIX) 40 MG tablet Take 1 tablet (40 mg total) by mouth daily. 90 tablet 3  . rosuvastatin (CRESTOR) 20 MG tablet TAKE 1 TABLET BY MOUTH DAILY. 30 tablet 11  . vitamin B-12 (CYANOCOBALAMIN) 500 MCG tablet Take 500 mcg by mouth at bedtime.     . [DISCONTINUED] TAZTIA XT 180 MG 24 hr capsule TAKE ONE CAPSULE BY MOUTH EVERY DAY 90 capsule 0   No current facility-administered medications for this visit.    Allergies as of 10/09/2015 - Review Complete 10/09/2015  Allergen Reaction Noted  . Byetta 10 mcg pen [exenatide] Nausea And Vomiting 10/03/2011  . Glyburide Other (See Comments) 08/15/2014  . Seroquel [quetiapine fumarate] Diarrhea, Nausea And Vomiting, and Other (See Comments) 08/15/2014    Vitals: BP 141/65 mmHg  Pulse 48  Ht 5\' 4"  (1.626 m)  Wt 143 lb (64.864 kg)  BMI 24.53 kg/m2 Last Weight:  Wt Readings from Last 1 Encounters:  10/09/15 143 lb (64.864 kg)   Last Height:   Ht Readings from Last 1 Encounters:  10/09/15 5\' 4"  (1.626 m)       Cranial Nerves:  The pupils are equal, round, and reactive to light. Visual fields are full to finger confrontation. Extraocular movements are intact. Trigeminal sensation is intact and the muscles of mastication are normal. The face is symmetric. The palate elevates in the midline. Hearing intact. Voice is normal. Shoulder shrug is normal. The tongue has normal motion without fasciculations.    Coordination:  Slowed but not  dysmetric. Some mild bradykinesia on finger tapping bilaterally  Gait:  erect, can walk without a walker, picking up her feet better, imbalance,  better turns. Not as shuffling, not magnetic gait. .  Motor Observation: Mild postural tremor. No resting tremor.    Tone:  Some very mild cogwheeling of the right arm with facilitation  Posture:  Stooped   Strength: Mild hip flexion weakness otherwise strength is V/V in the upper and lower limbs.    Sensation: intact to LT   Reflex Exam:  DTR's:  Deep tendon reflexes are brisk in the uppers and 1+ lower extremities  Toes:  The toes are downgoing bilaterally.  Clonus:  Clonus is absent.      Assessment/Plan: SAMARRA TOGUCHI is a 60 y.o. female here as a referral from Dr. Deborra Medina for Parkinsonism. She has been evaluated by multiple neurologists and apparently at [facility] and been diagnosed with Parkinsonism. She also has a PMHx of UTI, generalized weakness, DM, chronic pain utilizing pain medication, HTN, CAD, anxiety, bipolar disorder and reported 109-month period when patient was bed bound that daughter thinks may have contributed to symptoms due to disuse. She is Bipolar and was on long-term dopamine-blocking agent and daughter thinks that is may be possible that her parkinsonism started about the same time as the Abilify, unsure.   I suspect the etiology of her symptoms is multifactorial, DaT scan was negative so unlikely Parkinson's disease. She does have parkinsonian symptoms. There are multiple causes of parkinsonism including dopamine-blocking agents such as the Abilify she was taking, parkinson's plus disorders (psp, msa and others) , lewy-body dementia or other neurodegenerative disorders. I did recommend following up with her psychiatrist to see if the Abilify can be changed but it appears she has a long history of mood disorder and has tried multiple medications already.    Continue Current  medications, sinemet but 4 times, can try decreasing slowly and see if it is really helping   Her ventriculomegaly is commensurate with her volume loss, I do not think this is NPH but can't be sure. Discussed with daughter and patient, offered workup, discussed NPH, they decline at this time maybe in the future.   MRi of the brain: Abnormal MRI scan of the brain showing moderate degree of generalized cerebral atrophy. Thin sections through the pituitary do not show enlargement or definite adenoma. Ventriculomegaly with assymmetric lateral ventricles.  Addendum 1128: Records from [facility] received. Carotid Dopplers were performed and impression on the left no hemodynamically significant stenosis. On the right no hemodynamically significant stenosis. MRI of the cervical spine showed no focal canal or foraminal stenosis. Normal cord signal. She was seen by Dr. Deborra Medina for extremity weakness and tremors. This is dated 07/06/2014. Extremity started 3 months ago with weakness which was progressively worsening. Patient using a walker with difficulty walking. Left-sided facial droop with drooling. Also slurred speech. She has stopped her aspirin for a week prior to an epidural spinal injection. Tremor in the hands for about 3 months. Exam significant for intact strength, left lower facial droop otherwise normal cranial nerve exam, normal reflexes. The time they performed a brain MRI to evaluate for stroke, carotid ultrasound to evaluate for carotid artery stenosis. The results of the MRI of the brain were not included in these records. A note was made for referral to neurology but no neurology records found in the [facility] transmission.  Sarina Ill, MD  [facility] [address] [place], [place] [address]  Phone [phone] Fax [place], [facility] [address] [place], [place] [address]  Phone [phone]  Fax [phone]  A total of 30 minutes was spent face-to-face with this patient. Over half this time was spent on counseling patient on the parkinsonism diagnosis and different diagnostic and therapeutic options available.

## 2015-10-09 NOTE — Patient Instructions (Signed)
Remember to drink plenty of fluid, eat healthy meals and do not skip any meals. Try to eat protein with a every meal and eat a healthy snack such as fruit or nuts in between meals. Try to keep a regular sleep-wake schedule and try to exercise daily, particularly in the form of walking, 20-30 minutes a day, if you can.   I would like to see you back in 6 months or to a year, sooner if we need to. Please call us with any interim questions, concerns, problems, updates or refill requests.   Our phone number is [phone]. We also have an after hours call service for urgent matters and there is a physician on-call for urgent questions. For any emergencies you know to call 911 or go to the nearest emergency room

## 2015-10-11 ENCOUNTER — Encounter: Payer: Self-pay | Admitting: *Deleted

## 2015-10-12 ENCOUNTER — Encounter: Payer: Self-pay | Admitting: *Deleted

## 2015-10-12 ENCOUNTER — Ambulatory Visit
Admission: RE | Admit: 2015-10-12 | Discharge: 2015-10-12 | Disposition: A | Discharge status: Home / Self Care | Payer: Medicare Other | Source: Ambulatory Visit | Attending: Gastroenterology | Admitting: Gastroenterology

## 2015-10-12 ENCOUNTER — Ambulatory Visit: Payer: Medicare Other | Admitting: Anesthesiology

## 2015-10-12 ENCOUNTER — Encounter
Admission: RE | Disposition: A | Discharge status: Home / Self Care | Payer: Self-pay | Source: Ambulatory Visit | Attending: Gastroenterology

## 2015-10-12 DIAGNOSIS — E785 Hyperlipidemia, unspecified: Secondary | ICD-10-CM | POA: Insufficient documentation

## 2015-10-12 DIAGNOSIS — R197 Diarrhea, unspecified: Secondary | ICD-10-CM | POA: Insufficient documentation

## 2015-10-12 DIAGNOSIS — F319 Bipolar disorder, unspecified: Secondary | ICD-10-CM | POA: Insufficient documentation

## 2015-10-12 DIAGNOSIS — K5790 Diverticulosis of intestine, part unspecified, without perforation or abscess without bleeding: Secondary | ICD-10-CM | POA: Diagnosis not present

## 2015-10-12 DIAGNOSIS — E039 Hypothyroidism, unspecified: Secondary | ICD-10-CM | POA: Diagnosis not present

## 2015-10-12 DIAGNOSIS — M069 Rheumatoid arthritis, unspecified: Secondary | ICD-10-CM | POA: Insufficient documentation

## 2015-10-12 DIAGNOSIS — K5730 Diverticulosis of large intestine without perforation or abscess without bleeding: Secondary | ICD-10-CM | POA: Insufficient documentation

## 2015-10-12 DIAGNOSIS — I2510 Atherosclerotic heart disease of native coronary artery without angina pectoris: Secondary | ICD-10-CM | POA: Diagnosis not present

## 2015-10-12 DIAGNOSIS — I10 Essential (primary) hypertension: Secondary | ICD-10-CM | POA: Insufficient documentation

## 2015-10-12 DIAGNOSIS — M1990 Unspecified osteoarthritis, unspecified site: Secondary | ICD-10-CM | POA: Insufficient documentation

## 2015-10-12 DIAGNOSIS — K635 Polyp of colon: Secondary | ICD-10-CM | POA: Diagnosis not present

## 2015-10-12 DIAGNOSIS — Z7984 Long term (current) use of oral hypoglycemic drugs: Secondary | ICD-10-CM | POA: Diagnosis not present

## 2015-10-12 DIAGNOSIS — D123 Benign neoplasm of transverse colon: Secondary | ICD-10-CM | POA: Insufficient documentation

## 2015-10-12 DIAGNOSIS — D175 Benign lipomatous neoplasm of intra-abdominal organs: Secondary | ICD-10-CM | POA: Diagnosis not present

## 2015-10-12 DIAGNOSIS — G20 Parkinson's disease: Secondary | ICD-10-CM | POA: Diagnosis not present

## 2015-10-12 DIAGNOSIS — K648 Other hemorrhoids: Secondary | ICD-10-CM | POA: Diagnosis not present

## 2015-10-12 DIAGNOSIS — D122 Benign neoplasm of ascending colon: Secondary | ICD-10-CM | POA: Diagnosis not present

## 2015-10-12 DIAGNOSIS — Z86010 Personal history of colonic polyps: Secondary | ICD-10-CM | POA: Diagnosis not present

## 2015-10-12 DIAGNOSIS — F419 Anxiety disorder, unspecified: Secondary | ICD-10-CM | POA: Diagnosis not present

## 2015-10-12 DIAGNOSIS — E119 Type 2 diabetes mellitus without complications: Secondary | ICD-10-CM | POA: Diagnosis not present

## 2015-10-12 DIAGNOSIS — Z79899 Other long term (current) drug therapy: Secondary | ICD-10-CM | POA: Diagnosis not present

## 2015-10-12 HISTORY — PX: COLONOSCOPY WITH PROPOFOL: SHX5780

## 2015-10-12 LAB — CBC
HEMATOCRIT: 39.9 % (ref 35.0–47.0)
HEMOGLOBIN: 13.7 g/dL (ref 12.0–16.0)
MCH: 30.4 pg (ref 26.0–34.0)
MCHC: 34.3 g/dL (ref 32.0–36.0)
MCV: 88.6 fL (ref 80.0–100.0)
Platelets: 182 10*3/uL (ref 150–440)
RBC: 4.5 MIL/uL (ref 3.80–5.20)
RDW: 13.1 % (ref 11.5–14.5)
WBC: 7 10*3/uL (ref 3.6–11.0)

## 2015-10-12 LAB — GLUCOSE, CAPILLARY: GLUCOSE-CAPILLARY: 136 mg/dL — AB (ref 65–99)

## 2015-10-12 SURGERY — COLONOSCOPY WITH PROPOFOL
Anesthesia: General

## 2015-10-12 MED ORDER — LIDOCAINE HCL (PF) 2 % IJ SOLN
INTRAMUSCULAR | Status: DC | PRN
Start: 2015-10-12 — End: 2015-10-12
  Administered 2015-10-12: 60 mg via INTRADERMAL

## 2015-10-12 MED ORDER — PROPOFOL 10 MG/ML IV BOLUS
INTRAVENOUS | Status: DC | PRN
  Administered 2015-10-12: 60 mg via INTRAVENOUS

## 2015-10-12 MED ORDER — PROPOFOL 500 MG/50ML IV EMUL
INTRAVENOUS | Status: DC | PRN
  Administered 2015-10-12: 125 ug/kg/min via INTRAVENOUS

## 2015-10-12 MED ORDER — SODIUM CHLORIDE 0.9 % IV SOLN
INTRAVENOUS | Status: DC
  Administered 2015-10-12 (×2): via INTRAVENOUS

## 2015-10-12 MED ORDER — SODIUM CHLORIDE 0.9 % IV SOLN: INTRAVENOUS | Status: DC

## 2015-10-12 NOTE — Anesthesia Preprocedure Evaluation (Addendum)
Anesthesia Evaluation  Patient identified by MRN, date of birth, ID band Patient awake    Reviewed: Allergy & Precautions, NPO status , Patient's Chart, lab work & pertinent test results  History of Anesthesia Complications Negative for: history of anesthetic complications  Airway Mallampati: II       Dental  (+) Poor Dentition   Pulmonary neg pulmonary ROS,           Cardiovascular hypertension, Pt. on medications + CAD       Neuro/Psych Anxiety Bipolar Disorder negative neurological ROS     GI/Hepatic Neg liver ROS,   Endo/Other  diabetes, Type 2, Oral Hypoglycemic AgentsHypothyroidism   Renal/GU Renal disease (stones)negative Renal ROS     Musculoskeletal   Abdominal   Peds  Hematology   Anesthesia Other Findings   Reproductive/Obstetrics                            Anesthesia Physical Anesthesia Plan  ASA: III  Anesthesia Plan: General   Post-op Pain Management:    Induction: Intravenous  Airway Management Planned: Nasal Cannula  Additional Equipment:   Intra-op Plan:   Post-operative Plan:   Informed Consent: I have reviewed the patients History and Physical, chart, labs and discussed the procedure including the risks, benefits and alternatives for the proposed anesthesia with the patient or authorized representative who has indicated his/her understanding and acceptance.     Plan Discussed with:   Anesthesia Plan Comments:         Anesthesia Quick Evaluation

## 2015-10-12 NOTE — Transfer of Care (Signed)
Immediate Anesthesia Transfer of Care Note  Patient: Lori Phillips  Procedure(s) Performed: Procedure(s): COLONOSCOPY WITH PROPOFOL (N/A)  Patient Location: PACU  Anesthesia Type:General  Level of Consciousness: sedated and responds to stimulation  Airway & Oxygen Therapy: Patient Spontanous Breathing and Patient connected to nasal cannula oxygen  Post-op Assessment: Report given to RN and Post -op Vital signs reviewed and stable  Post vital signs: Reviewed and stable  Last Vitals:  Filed Vitals:   10/12/15 0806 10/12/15 1018  BP: 165/76 102/55  Pulse: 54   Temp: 35.6 C   Resp: 14 11    Last Pain: There were no vitals filed for this visit.       Complications: No apparent anesthesia complications

## 2015-10-12 NOTE — H&P (Signed)
Outpatient short stay form Pre-procedure 10/12/2015 8:45 AM Lollie Sails MD  Primary Physician: Dr. Arnette Norris  Reason for visit:  Colonoscopy  History of present illness:  Patient is a 60 year old female with presenting today for colonoscopy. He has a personal history of adenomatous colon polyps last colonoscopy being done about 4 years ago. She also has since then developed some fecal urgency with diarrhea after eating greasy foods. She does have a history of Parkinson's disease. Except for an 81 mg aspirin daily that has been held, She takes no aspirin products or blood thinning agents.   Current facility-administered medications:  .  0.9 %  sodium chloride infusion, , Intravenous, Continuous, Lollie Sails, MD, Last Rate: 20 mL/hr at 10/12/15 0841 .  0.9 %  sodium chloride infusion, , Intravenous, Continuous, Lollie Sails, MD  Prescriptions prior to admission  Medication Sig Dispense Refill Last Dose  . BIOTIN PO Take 1 tablet by mouth daily.   Past Week at Unknown time  . carbidopa-levodopa (SINEMET IR) 25-100 MG tablet Take 1 tablet by mouth 4 (four) times daily. Can take it every 3-4 hours. 360 tablet 12 10/11/2015 at Unknown time  . CARTIA XT 180 MG 24 hr capsule TAKE 1 CAPSULE BY MOUTH EVERY DAY 90 capsule 1 10/11/2015 at Unknown time  . clonazePAM (KLONOPIN) 0.5 MG tablet Take 1 tablet (0.5 mg total) by mouth daily as needed. (Patient taking differently: Take 0.5 mg by mouth daily as needed for anxiety. ) 30 tablet 0 10/11/2015 at Unknown time  . divalproex (DEPAKOTE ER) 500 MG 24 hr tablet Take 1,000 mg by mouth daily.   10/11/2015 at Unknown time  . gabapentin (NEURONTIN) 100 MG capsule Take 100-200 mg by mouth 2 (two) times daily. Pt takes one every morning and two every evening.   10/11/2015 at Unknown time  . levothyroxine (SYNTHROID, LEVOTHROID) 100 MCG tablet Take 1 tablet (100 mcg total) by mouth daily. 30 tablet 2 10/11/2015 at Unknown time  . lisinopril  (PRINIVIL,ZESTRIL) 5 MG tablet TAKE 1 TABLET BY MOUTH EVERY DAY 30 tablet 2 10/11/2015 at Unknown time  . metFORMIN (GLUCOPHAGE) 500 MG tablet TAKE 1 TABLET(500 MG) BY MOUTH TWICE DAILY WITH A MEAL 60 tablet 1 10/11/2015 at Unknown time  . pantoprazole (PROTONIX) 40 MG tablet Take 1 tablet (40 mg total) by mouth daily. 90 tablet 3 10/11/2015 at Unknown time  . rosuvastatin (CRESTOR) 20 MG tablet TAKE 1 TABLET BY MOUTH DAILY. 30 tablet 11 10/11/2015 at Unknown time  . vitamin B-12 (CYANOCOBALAMIN) 500 MCG tablet Take 500 mcg by mouth at bedtime.    Past Week at Unknown time  . acetaminophen (TYLENOL) 650 MG CR tablet Take 650 mg by mouth 3 (three) times daily as needed for pain.   Taking  . diazepam (VALIUM) 5 MG tablet Take 2.5 mg by mouth at bedtime.    Taking  . nystatin cream (MYCOSTATIN) Apply 1 application topically 2 (two) times daily as needed (for sore).   Taking     Allergies  Allergen Reactions  . Byetta 10 Mcg Pen [Exenatide] Nausea And Vomiting  . Glyburide Other (See Comments)    Reaction:  Dizziness and confusion   . Seroquel [Quetiapine Fumarate] Diarrhea, Nausea And Vomiting and Other (See Comments)    Reaction:  Confusion      Past Medical History  Diagnosis Date  . CAD (coronary artery disease)     multiple caths  . Diabetes mellitus   . Hyperlipidemia   .  Hypertension   . Thyroid disease     hypothyroid  . Low back pain   . Nephrolithiasis   . Fibromyalgia   . Bipolar disorder ([place])   . Anxiety   . Panic attacks   . Enlarged liver   . Rheumatoid arthritis ([place])   . Enlarged pituitary gland ([place])   . Fibromyalgia   . Scoliosis   . Osteoporosis   . Osteoarthritis   . Kidney stones   . Hypothyroidism   . Allergy   . Parkinson's disease ([place]) 2016    Review of systems:      Physical Exam    Heart and lungs: Regular rate and rhythm without rub or gallop, lungs are bilaterally clear.    HEENT: Normocephalic atraumatic eyes are anicteric    Other:      Pertinant exam for procedure: Soft nontender nondistended bowel sounds positive normoactive.    Planned proceedures: Colonoscopy and indicated procedures. I have discussed the risks benefits and complications of procedures to include not limited to bleeding, infection, perforation and the risk of sedation and the patient wishes to proceed.

## 2015-10-12 NOTE — OR Nursing (Signed)
[place] Ink 4 ml injected in colon at 52 cm.

## 2015-10-12 NOTE — Op Note (Signed)
[facility] Gastroenterology Patient Name: Lori Phillips Procedure Date: 10/12/2015 8:50 AM MRN: [id number] Account #: [id number] Date of Birth: Aug 05, 1955 Admit Type: Outpatient Age: 60 Room: [facility] ENDO ROOM 4 Gender: Female Note Status: Finalized Procedure:            Colonoscopy Indications:          Clinically significant diarrhea of unexplained origin,                        Personal history of colonic polyps Providers:            Lollie Sails, MD Referring MD:         Marciano Sequin. Deborra Medina (Referring MD) Medicines:            Monitored Anesthesia Care Complications:        No immediate complications. Procedure:            Pre-Anesthesia Assessment:                       - ASA Grade Assessment: III - A patient with severe                        systemic disease.                       After obtaining informed consent, the colonoscope was                        passed under direct vision. Throughout the procedure,                        the patient's blood pressure, pulse, and oxygen                        saturations were monitored continuously. The                        Colonoscope was introduced through the anus and                        advanced to the the cecum, identified by appendiceal                        orifice and ileocecal valve. The colonoscopy was                        extremely difficult due to significant looping and a                        tortuous colon. Successful completion of the procedure                        was aided by changing the patient to a supine position,                        using manual pressure and withdrawing and reinserting                        the scope. The patient tolerated the procedure well.  The quality of the bowel preparation was good. Findings:      Multiple small-mouthed diverticula were found in the sigmoid colon,       descending colon, transverse colon and ascending colon.      One  6 mm very firmsubmucosal nodule was found at 52 cm proximal to the       anus. Biopsies were taken in a biopsy on biopsy fashion with a cold       forceps for histology. Area was tattooed with an injection of 2 mL of       [place] ink.      A 4 mm polyp was found in the transverse colon. The polyp was sessile.       The polyp was removed with a cold snare. Resection and retrieval were       complete.      A 4 mm polyp was found in the transverse colon. The polyp was sessile.       The polyp was removed with a cold snare. Resection and retrieval were       complete.      A 3 mm polyp was found in the transverse colon. The polyp was sessile.       The polyp was removed with a cold biopsy forceps. Resection and       retrieval were complete.      A 4 mm polyp was found in the hepatic flexure. The polyp was sessile.       The polyp was removed with a cold snare. Resection and retrieval were       complete.      A 4 mm polyp was found in the ascending colon. The polyp was sessile.       The polyp was removed with a cold snare. Resection and retrieval were       complete.      Biopsies for histology were taken with a cold forceps from the right       colon and left colon for evaluation of microscopic colitis.      Non-bleeding internal hemorrhoids were found during anoscopy. The       hemorrhoids were small. Impression:           - Diverticulosis in the sigmoid colon, in the                        descending colon, in the transverse colon and in the                        ascending colon.                       - Submucosal nodule at 52 cm proximal to the anus.                        Biopsied. Tattooed.                       - One 4 mm polyp in the transverse colon, removed with                        a cold snare. Resected and retrieved.                       - One 4 mm polyp in  the transverse colon, removed with                        a cold snare. Resected and retrieved.                        - One 3 mm polyp in the transverse colon, removed with                        a cold biopsy forceps. Resected and retrieved.                       - One 4 mm polyp at the hepatic flexure, removed with a                        cold snare. Resected and retrieved.                       - One 4 mm polyp in the ascending colon, removed with a                        cold snare. Resected and retrieved.                       - Non-bleeding internal hemorrhoids.                       - Biopsies were taken with a cold forceps from the                        right colon and left colon for evaluation of                        microscopic colitis. Recommendation:       - Await pathology results.                       - Telephone GI clinic for pathology results in 1 week.                       - Return to GI clinic in 1 month. Procedure Code(s):    --- Professional ---                       [phone], Colonoscopy, flexible; with removal of tumor(s),                        polyp(s), or other lesion(s) by snare technique                       [id number], Colonoscopy, flexible; with directed submucosal                        injection(s), any substance                       [id number], 59, Colonoscopy, flexible; with biopsy, single                        or multiple Diagnosis Code(s):    --- Professional ---  K64.8, Other hemorrhoids                       K63.89, Other specified diseases of intestine                       D12.3, Benign neoplasm of transverse colon (hepatic                        flexure or splenic flexure)                       D12.2, Benign neoplasm of ascending colon                       R19.7, Diarrhea, unspecified                       Z86.010, Personal history of colonic polyps                       K57.30, Diverticulosis of large intestine without                        perforation or abscess without bleeding CPT copyright 2016 American Medical Association. All rights  reserved. The codes documented in this report are preliminary and upon coder review may  be revised to meet current compliance requirements. Lollie Sails, MD 10/12/2015 10:19:35 AM This report has been signed electronically. Number of Addenda: 0 Note Initiated On: 10/12/2015 8:50 AM Scope Withdrawal Time: 0 hours 14 minutes 11 seconds  Total Procedure Duration: 1 hour 9 minutes 2 seconds       [facility]

## 2015-10-12 NOTE — Anesthesia Postprocedure Evaluation (Signed)
Anesthesia Post Note  Patient: Lori Phillips  Procedure(s) Performed: Procedure(s) (LRB): COLONOSCOPY WITH PROPOFOL (N/A)  Patient location during evaluation: Endoscopy Anesthesia Type: General Level of consciousness: awake and alert Pain management: pain level controlled Vital Signs Assessment: post-procedure vital signs reviewed and stable Respiratory status: spontaneous breathing and respiratory function stable Cardiovascular status: stable Anesthetic complications: no    Last Vitals:  Filed Vitals:   10/12/15 1030 10/12/15 1040  BP: 122/78 142/60  Pulse:  52  Temp:    Resp: 21 17    Last Pain: There were no vitals filed for this visit.               Christopherjame Carnell K

## 2015-10-15 ENCOUNTER — Encounter: Payer: Self-pay | Admitting: Gastroenterology

## 2015-10-16 LAB — SURGICAL PATHOLOGY

## 2015-10-25 ENCOUNTER — Other Ambulatory Visit: Payer: Self-pay | Admitting: Internal Medicine

## 2015-10-25 ENCOUNTER — Other Ambulatory Visit: Payer: Self-pay | Admitting: Family Medicine

## 2015-10-30 ENCOUNTER — Telehealth: Payer: Self-pay

## 2015-10-30 NOTE — Telephone Encounter (Signed)
  Oncology Nurse Navigator Documentation  Navigator Location: [facility] Onc (10/30/15 1400) Navigator Encounter Type: Telephone (10/30/15 1400) Telephone: Outgoing Call (10/30/15 1400)             Barriers/Navigation Needs: Coordination of Care (10/30/15 1400)   Interventions: Coordination of Care (10/30/15 1400)   Coordination of Care: EUS (10/30/15 1400)        Acuity: Level 2 (10/30/15 1400)   Acuity Level 2: Initial guidance, education and coordination as needed;Assistance expediting appointments;Ongoing guidance and education throughout treatment as needed (10/30/15 1400)     Time Spent with Patient: 30 (10/30/15 1400)   Received referral from [facility] for EUS for lesion found on colonoscopy. Voicemail left with patient to return call for scheduling.

## 2015-10-30 NOTE — Telephone Encounter (Signed)
  Oncology Nurse Navigator Documentation  Navigator Location: [facility] Onc (10/30/15 1600) Navigator Encounter Type: Telephone (10/30/15 1600) Telephone: Incoming Call (10/30/15 1600)             Barriers/Navigation Needs: Coordination of Care;Education (10/30/15 1600)   Interventions: Coordination of Care (10/30/15 1600)   Coordination of Care: EUS (10/30/15 1600)                  Time Spent with Patient: 30 (10/30/15 1600)   Spoke with Ms Gneiting on the telephone. EUS scheduled for 11-01-15 with Dr Mont Dutton. Went over instructions with her and her daughter is coming by today to get a written copy. Miralax/Gatorade prep enclosed in instructions.  INSTRUCTIONS FOR ENDOSCOPIC ULTRASOUND   -Your procedure has been scheduled for July 13th with Dr Mont Dutton.  -The hospital will contact you to pre-register over the phone.   -To get your scheduled arrival time, please call the Endoscopy unit at  [phone] between 1-3pm on: July 12th   - Instructions for Miralax-Gatorade Bowel Preparation are enclosed. To ensure a successful exam, please follow the instructions carefully.  -Begin your bowel prep at 12:00PM on July 12th    -ON THE DAY OF YOU PROCEDURE:              1. You may take your heart, seizure, blood pressure, Parkinson's or breathing        medications at 6am with just enough water to get your pills down             2. Do not take any oral Diabetic medications the morning of your procedure. 3. If you are a diabetic and are using insulin, please notify your prescribing physician of this procedure as your dose may need to be altered for this procedure             4. Do not take Vitamin E or fish oil for 7 days prior to exam  -On the day of your procedure, come to the [facility] Admitting/Registration desk (First desk on the right) at the scheduled arrival time.  You MUST have someone drive you home from your procedure. You must have a responsible adult with  a valid drivers license who is on site throughout your entire procedure and who can stay with you for several hours after your procedure. You may not go home alone in a taxi, shuttle [place] or bus, as the drivers will not be responsible for you.  --If you have any questions please call me at the above contact

## 2015-11-02 ENCOUNTER — Telehealth: Payer: Self-pay

## 2015-11-02 NOTE — Telephone Encounter (Signed)
  Oncology Nurse Navigator Documentation  Navigator Location: [facility] Onc (11/02/15 1500)                                            Time Spent with Patient: 15 (11/02/15 1500)   Luria at [facility] GI was notified 7/12 regarding recommendation from Dr Mont Dutton that patient did not need any further screening based on pathology. If Dr Gustavo Lah would like to pursue EUS it will need to be arranged at [facility].

## 2015-11-06 ENCOUNTER — Telehealth: Payer: Self-pay

## 2015-11-06 NOTE — Telephone Encounter (Signed)
  Oncology Nurse Navigator Documentation  Navigator Location: [facility] Onc (11/06/15 1400) Navigator Encounter Type: Telephone (11/06/15 1400) Telephone: Lahoma Crocker Call;Incoming Call (11/06/15 1400)                                        Time Spent with Patient: 30 (11/06/15 1400)   Spoke with Suanne Marker from Dr Durenda Age office. He would like to proceed with EUS at [facility] . Spoke with patient and she is in agreeable. Records sent and confirmation received.

## 2015-11-11 ENCOUNTER — Other Ambulatory Visit: Payer: Self-pay | Admitting: Family Medicine

## 2015-11-16 ENCOUNTER — Ambulatory Visit: Payer: Medicare Other | Admitting: Internal Medicine

## 2015-11-19 DIAGNOSIS — Z7984 Long term (current) use of oral hypoglycemic drugs: Secondary | ICD-10-CM | POA: Diagnosis not present

## 2015-11-19 DIAGNOSIS — K219 Gastro-esophageal reflux disease without esophagitis: Secondary | ICD-10-CM | POA: Diagnosis not present

## 2015-11-19 DIAGNOSIS — E039 Hypothyroidism, unspecified: Secondary | ICD-10-CM | POA: Diagnosis not present

## 2015-11-19 DIAGNOSIS — E785 Hyperlipidemia, unspecified: Secondary | ICD-10-CM | POA: Diagnosis not present

## 2015-11-19 DIAGNOSIS — Z79899 Other long term (current) drug therapy: Secondary | ICD-10-CM | POA: Diagnosis not present

## 2015-11-19 DIAGNOSIS — E119 Type 2 diabetes mellitus without complications: Secondary | ICD-10-CM | POA: Diagnosis not present

## 2015-11-19 DIAGNOSIS — G20 Parkinson's disease: Secondary | ICD-10-CM | POA: Diagnosis not present

## 2015-11-19 DIAGNOSIS — I2510 Atherosclerotic heart disease of native coronary artery without angina pectoris: Secondary | ICD-10-CM | POA: Diagnosis not present

## 2015-11-19 DIAGNOSIS — I10 Essential (primary) hypertension: Secondary | ICD-10-CM | POA: Diagnosis not present

## 2015-11-19 DIAGNOSIS — Z8719 Personal history of other diseases of the digestive system: Secondary | ICD-10-CM | POA: Diagnosis not present

## 2015-11-19 DIAGNOSIS — D124 Benign neoplasm of descending colon: Secondary | ICD-10-CM | POA: Diagnosis not present

## 2015-11-21 ENCOUNTER — Encounter: Payer: Self-pay | Admitting: Family Medicine

## 2015-11-27 ENCOUNTER — Encounter: Payer: Self-pay | Admitting: Family Medicine

## 2015-11-27 ENCOUNTER — Ambulatory Visit: Payer: Medicare Other | Admitting: Internal Medicine

## 2015-11-27 DIAGNOSIS — Z0289 Encounter for other administrative examinations: Secondary | ICD-10-CM

## 2015-11-30 ENCOUNTER — Other Ambulatory Visit: Payer: Self-pay | Admitting: Internal Medicine

## 2015-12-13 ENCOUNTER — Encounter: Payer: Self-pay | Admitting: Family Medicine

## 2015-12-13 ENCOUNTER — Ambulatory Visit (INDEPENDENT_AMBULATORY_CARE_PROVIDER_SITE_OTHER): Payer: Medicare Other | Admitting: Family Medicine

## 2015-12-13 VITALS — BP 152/82 | HR 47 | Temp 97.8°F | Wt 149.5 lb

## 2015-12-13 DIAGNOSIS — Z9889 Other specified postprocedural states: Secondary | ICD-10-CM | POA: Diagnosis not present

## 2015-12-13 DIAGNOSIS — Z23 Encounter for immunization: Secondary | ICD-10-CM | POA: Diagnosis not present

## 2015-12-13 DIAGNOSIS — R32 Unspecified urinary incontinence: Secondary | ICD-10-CM

## 2015-12-13 HISTORY — DX: Other specified postprocedural states: Z98.890

## 2015-12-13 HISTORY — DX: Unspecified urinary incontinence: R32

## 2015-12-13 LAB — POC URINALSYSI DIPSTICK (AUTOMATED)
Bilirubin, UA: NEGATIVE
Ketones, UA: NEGATIVE
Leukocytes, UA: NEGATIVE
Nitrite, UA: NEGATIVE
PROTEIN UA: NEGATIVE
RBC UA: NEGATIVE
UROBILINOGEN UA: 0.2
pH, UA: 6

## 2015-12-13 NOTE — Assessment & Plan Note (Signed)
Deteriorated. Neg UA. Mixed. Refer to urogyn given h/o bladder sling. The patient indicates understanding of these issues and agrees with the plan.

## 2015-12-13 NOTE — Patient Instructions (Signed)
Good to see you. Please stop by to see Lori Phillips or Lori Phillips on your way out. 

## 2015-12-13 NOTE — Progress Notes (Signed)
Subjective:   Patient ID: Lori Phillips, female    DOB: 1955-08-24, 60 y.o.   MRN: [id number]  Lori Phillips is a pleasant 60 y.o. year old female who presents to clinic today with her daughter with Urinary Frequency and Urinary Incontinence  on 12/13/2015  HPI:  Several months of mixed urinary incontinence. No dysuria.  Has had more urinary frequency.  Remote h/o bladder sling surgery.  No back pain.  Current Outpatient Prescriptions on File Prior to Visit  Medication Sig Dispense Refill  . acetaminophen (TYLENOL) 650 MG CR tablet Take 650 mg by mouth 3 (three) times daily as needed for pain.    Marland Kitchen BIOTIN PO Take 1 tablet by mouth daily.    . carbidopa-levodopa (SINEMET IR) 25-100 MG tablet Take 1 tablet by mouth 4 (four) times daily. Can take it every 3-4 hours. 360 tablet 12  . CARTIA XT 180 MG 24 hr capsule TAKE 1 CAPSULE BY MOUTH EVERY DAY 90 capsule 1  . clonazePAM (KLONOPIN) 0.5 MG tablet Take 1 tablet (0.5 mg total) by mouth daily as needed. (Patient taking differently: Take 0.5 mg by mouth daily as needed for anxiety. ) 30 tablet 0  . diazepam (VALIUM) 5 MG tablet Take 2.5 mg by mouth at bedtime.     . divalproex (DEPAKOTE ER) 500 MG 24 hr tablet Take 1,000 mg by mouth daily.    Marland Kitchen gabapentin (NEURONTIN) 100 MG capsule Take 100-200 mg by mouth 2 (two) times daily. Pt takes one every morning and two every evening.    Marland Kitchen levothyroxine (SYNTHROID, LEVOTHROID) 100 MCG tablet TAKE 1 TABLET( 100 MCG) BY MOUTH DAILY 90 tablet 0  . lisinopril (PRINIVIL,ZESTRIL) 5 MG tablet TAKE 1 TABLET BY MOUTH EVERY DAY 30 tablet 0  . metFORMIN (GLUCOPHAGE) 500 MG tablet TAKE 1 TABLET(500 MG) BY MOUTH TWICE DAILY WITH A MEAL 60 tablet 1  . nystatin cream (MYCOSTATIN) Apply 1 application topically 2 (two) times daily as needed (for sore).    . pantoprazole (PROTONIX) 40 MG tablet Take 1 tablet (40 mg total) by mouth daily. 90 tablet 3  . rosuvastatin (CRESTOR) 20 MG tablet TAKE 1 TABLET BY MOUTH  DAILY. 30 tablet 11  . vitamin B-12 (CYANOCOBALAMIN) 500 MCG tablet Take 500 mcg by mouth at bedtime.     . [DISCONTINUED] TAZTIA XT 180 MG 24 hr capsule TAKE ONE CAPSULE BY MOUTH EVERY DAY 90 capsule 0   No current facility-administered medications on file prior to visit.     Allergies  Allergen Reactions  . Byetta 10 Mcg Pen [Exenatide] Nausea And Vomiting  . Glyburide Other (See Comments)    Reaction:  Dizziness and confusion   . Seroquel [Quetiapine Fumarate] Diarrhea, Nausea And Vomiting and Other (See Comments)    Reaction:  Confusion     Past Medical History:  Diagnosis Date  . Allergy   . Anxiety   . Bipolar disorder ([place])   . CAD (coronary artery disease)    multiple caths  . Diabetes mellitus   . Enlarged liver   . Enlarged pituitary gland ([place])   . Fibromyalgia   . Fibromyalgia   . Hyperlipidemia   . Hypertension   . Hypothyroidism   . Kidney stones   . Low back pain   . Nephrolithiasis   . Osteoarthritis   . Osteoporosis   . Panic attacks   . Parkinson's disease ([place]) 2016  . Rheumatoid arthritis ([place])   . Scoliosis   . Thyroid  disease    hypothyroid    Past Surgical History:  Procedure Laterality Date  . CARDIAC CATHETERIZATION     x3  . COLONOSCOPY  multiple   2005 [place] records not available-polyps x 3  . COLONOSCOPY WITH PROPOFOL N/A 10/12/2015   Procedure: COLONOSCOPY WITH PROPOFOL;  Surgeon: Lollie Sails, MD;  Location: [facility] ENDOSCOPY;  Service: Endoscopy;  Laterality: N/A;  . ESOPHAGEAL DILATION    . kidney stone removal    . TONSILLECTOMY    . VAGINAL HYSTERECTOMY      Family History  Problem Relation Age of Onset  . Hypertension Mother   . Diabetes Mother   . Bipolar disorder Father   . Hypertension Father   . Colon cancer Paternal Aunt   . Colon cancer Paternal Grandmother   . Thyroid disease Neg Hx   . Breast cancer Neg Hx     Social History   Social History  . Marital status: Single    Spouse name: N/A  .  Number of children: 3  . Years of education: 12   Occupational History  . disability  Other   Social History Main Topics  . Smoking status: Never Smoker  . Smokeless tobacco: Never Used  . Alcohol use No  . Drug use: No  . Sexual activity: Not on file   Other Topics Concern  . Not on file   Social History Narrative   Has a living will- desires CPR but no prolonged life support if futile.   Caffeine use: Drinks 2 L soda per day   Drinks 2 glasses tea/day   The PMH, PSH, Social History, Family History, Medications, and allergies have been reviewed in [facility], and have been updated if relevant.   Review of Systems  Constitutional: Negative.   Gastrointestinal: Negative.   Genitourinary: Positive for frequency. Negative for dysuria.       +urinary frequency  All other systems reviewed and are negative.      Objective:    BP (!) 152/82   Pulse (!) 47   Temp 97.8 F (36.6 C) (Oral)   Wt 149 lb 8 oz (67.8 kg)   SpO2 99%   BMI 25.66 kg/m    Physical Exam  Constitutional: She is oriented to person, place, and time. She appears well-developed and well-nourished. No distress.  HENT:  Head: Normocephalic.  Eyes: Conjunctivae are normal.  Cardiovascular: Normal rate.   Pulmonary/Chest: Effort normal.  Neurological: She is alert and oriented to person, place, and time. No cranial nerve deficit.  Skin: Skin is warm and dry. She is not diaphoretic.  Psychiatric: She has a normal mood and affect. Her behavior is normal. Judgment and thought content normal.  Nursing note and vitals reviewed.         Assessment & Plan:   Urinary incontinence, unspecified incontinence type - Plan: Ambulatory referral to Urogynecology  History of suburethral sling procedure - Plan: Ambulatory referral to Urogynecology No Follow-up on file.

## 2015-12-13 NOTE — Progress Notes (Signed)
Pre visit review using our clinic review tool, if applicable. No additional management support is needed unless otherwise documented below in the visit note. 

## 2015-12-21 ENCOUNTER — Other Ambulatory Visit: Payer: Self-pay | Admitting: Pain Medicine

## 2015-12-25 ENCOUNTER — Other Ambulatory Visit: Payer: Self-pay | Admitting: Internal Medicine

## 2015-12-25 ENCOUNTER — Other Ambulatory Visit: Payer: Self-pay | Admitting: Family Medicine

## 2016-01-04 ENCOUNTER — Telehealth: Payer: Self-pay

## 2016-01-04 ENCOUNTER — Ambulatory Visit (INDEPENDENT_AMBULATORY_CARE_PROVIDER_SITE_OTHER): Payer: Medicare Other | Admitting: Urology

## 2016-01-04 ENCOUNTER — Encounter: Payer: Self-pay | Admitting: Urology

## 2016-01-04 VITALS — BP 134/83 | HR 88 | Ht 64.5 in | Wt 151.5 lb

## 2016-01-04 DIAGNOSIS — N3946 Mixed incontinence: Secondary | ICD-10-CM

## 2016-01-04 DIAGNOSIS — N3281 Overactive bladder: Secondary | ICD-10-CM

## 2016-01-04 DIAGNOSIS — N39498 Other specified urinary incontinence: Secondary | ICD-10-CM

## 2016-01-04 LAB — URINALYSIS, COMPLETE
BILIRUBIN UA: NEGATIVE
Glucose, UA: NEGATIVE
KETONES UA: NEGATIVE
NITRITE UA: NEGATIVE
PH UA: 5 (ref 5.0–7.5)
RBC UA: NEGATIVE
SPEC GRAV UA: 1.025 (ref 1.005–1.030)
Urobilinogen, Ur: 0.2 mg/dL (ref 0.2–1.0)

## 2016-01-04 LAB — MICROSCOPIC EXAMINATION

## 2016-01-04 MED ORDER — SOLIFENACIN SUCCINATE 10 MG PO TABS: 10.0000 mg | ORAL_TABLET | Freq: Every day | ORAL | 11 refills | Status: DC

## 2016-01-04 MED ORDER — FESOTERODINE FUMARATE ER 8 MG PO TB24: 8.0000 mg | ORAL_TABLET | Freq: Every day | ORAL | 11 refills | Status: DC

## 2016-01-04 NOTE — Progress Notes (Signed)
01/04/2016 3:16 PM   Lori Phillips [birth year] [id number]  Referring provider: Lucille Passy, MD [address] [place], [place] [address]  Chief Complaint  Patient presents with  . New Patient (Initial Visit)    urinary incontinence     HPI:   The patient has urinary incontinence worsening over 1 year. He sometimes leaks with coughing sneezing and possibly with bending. Her primary symptom is urgency incontinence. She wears a pad at night but she thinks it is normally dried. She wears 1 or 2 pads a day I can be damp or soaking.   She voids every 15-30 minutes but she thinks she consented through a 2 hour movie. She is a 3-4 times at night. She is on oral hypoglycemics. She uses a walker. She has had a hysterectomy. She has loose bowel movements  Her incontinence is not been medically treated  Modifying factors: There are no other modifying factors  Associated signs and symptoms: There are no other associated signs and symptoms Aggravating and relieving factors: There are no other aggravating or relieving factors Severity: Moderate Duration: Persistent   PMH: Past Medical History:  Diagnosis Date  . Allergy   . Anxiety   . Bipolar disorder ([place])   . CAD (coronary artery disease)    multiple caths  . Diabetes mellitus   . Enlarged liver   . Enlarged pituitary gland ([place])   . Fibromyalgia   . Fibromyalgia   . Hyperlipidemia   . Hypertension   . Hypothyroidism   . Kidney stones   . Low back pain   . Nephrolithiasis   . Osteoarthritis   . Osteoporosis   . Panic attacks   . Parkinson's disease ([place]) 2016  . Rheumatoid arthritis ([place])   . Scoliosis   . Thyroid disease    hypothyroid    Surgical History: Past Surgical History:  Procedure Laterality Date  . BLADDER SURGERY     ?bladder sling   . CARDIAC CATHETERIZATION     x3  . COLONOSCOPY  multiple   2005 [place] records not available-polyps x 3  . COLONOSCOPY WITH PROPOFOL N/A 10/12/2015   Procedure:  COLONOSCOPY WITH PROPOFOL;  Surgeon: Lollie Sails, MD;  Location: [facility] ENDOSCOPY;  Service: Endoscopy;  Laterality: N/A;  . ESOPHAGEAL DILATION    . kidney stone removal    . TONSILLECTOMY    . VAGINAL HYSTERECTOMY      Home Medications:    Medication List       Accurate as of 01/04/16  3:16 PM. Always use your most recent med list.          acetaminophen 650 MG CR tablet Commonly known as:  TYLENOL Take 650 mg by mouth 3 (three) times daily as needed for pain.   BIOTIN PO Take 1 tablet by mouth daily.   carbidopa-levodopa 25-100 MG tablet Commonly known as:  SINEMET IR Take 1 tablet by mouth 4 (four) times daily. Can take it every 3-4 hours.   CARTIA XT 180 MG 24 hr capsule Generic drug:  diltiazem TAKE 1 CAPSULE BY MOUTH EVERY DAY   clonazePAM 0.5 MG tablet Commonly known as:  KLONOPIN Take 1 tablet (0.5 mg total) by mouth daily as needed.   diazepam 5 MG tablet Commonly known as:  VALIUM Take 2.5 mg by mouth at bedtime.   divalproex 500 MG 24 hr tablet Commonly known as:  DEPAKOTE ER Take 1,000 mg by mouth daily.   gabapentin 100 MG capsule  Commonly known as:  NEURONTIN Take 100-200 mg by mouth 2 (two) times daily. Pt takes one every morning and two every evening.   gabapentin 100 MG capsule Commonly known as:  NEURONTIN LIMIT 1 TO 2 CAPSULES BY MOUTH TWICE TO THREE TIMES DAILY IF TOLERATED   levothyroxine 100 MCG tablet Commonly known as:  SYNTHROID, LEVOTHROID TAKE 1 TABLET( 100 MCG) BY MOUTH DAILY   lisinopril 5 MG tablet Commonly known as:  PRINIVIL,ZESTRIL TAKE 1 TABLET BY MOUTH EVERY DAY   metFORMIN 500 MG tablet Commonly known as:  GLUCOPHAGE TAKE 1 TABLET(500 MG) BY MOUTH TWICE DAILY WITH A MEAL   nystatin cream Commonly known as:  MYCOSTATIN Apply 1 application topically 2 (two) times daily as needed (for sore).   pantoprazole 40 MG tablet Commonly known as:  PROTONIX Take 1 tablet (40 mg total) by mouth daily.   rosuvastatin 20  MG tablet Commonly known as:  CRESTOR TAKE 1 TABLET BY MOUTH DAILY.   vitamin B-12 500 MCG tablet Commonly known as:  CYANOCOBALAMIN Take 500 mcg by mouth at bedtime.       Allergies:  Allergies  Allergen Reactions  . Byetta 10 Mcg Pen [Exenatide] Nausea And Vomiting    Other reaction(s): Nausea And Vomiting  . Glyburide Other (See Comments)    Reaction:  Dizziness and confusion   . Seroquel [Quetiapine Fumarate] Diarrhea, Nausea And Vomiting and Other (See Comments)    Reaction:  Confusion     Family History: Family History  Problem Relation Age of Onset  . Hypertension Mother   . Diabetes Mother   . Bipolar disorder Father   . Hypertension Father   . Colon cancer Paternal Aunt   . Colon cancer Paternal Grandmother   . Thyroid disease Neg Hx   . Breast cancer Neg Hx     Social History:  reports that she has never smoked. She has never used smokeless tobacco. She reports that she does not drink alcohol or use drugs.  ROS: UROLOGY Frequent Urination?: Yes Hard to postpone urination?: Yes Burning/pain with urination?: No Get up at night to urinate?: Yes Leakage of urine?: Yes Urine stream starts and stops?: Yes Trouble starting stream?: Yes Do you have to strain to urinate?: Yes Blood in urine?: No Urinary tract infection?: No Sexually transmitted disease?: No Injury to kidneys or bladder?: No Painful intercourse?: No Weak stream?: No Currently pregnant?: No Vaginal bleeding?: No Last menstrual period?: No  Gastrointestinal Nausea?: No Vomiting?: No Indigestion/heartburn?: No Diarrhea?: Yes Constipation?: No  Constitutional Fever: No Night sweats?: No Weight loss?: No Fatigue?: Yes  Skin Skin rash/lesions?: No Itching?: No  Eyes Blurred vision?: No Double vision?: No  Ears/Nose/Throat Sore throat?: No Sinus problems?: No  Hematologic/Lymphatic Swollen glands?: No Easy bruising?: No  Cardiovascular Leg swelling?: No Chest pain?:  No  Respiratory Cough?: No Shortness of breath?: No  Endocrine Excessive thirst?: Yes  Musculoskeletal Back pain?: Yes Joint pain?: Yes  Neurological Headaches?: No Dizziness?: No  Psychologic Depression?: Yes Anxiety?: Yes  Physical Exam: BP 134/83   Pulse 88   Ht 5' 4.5" (1.638 m)   Wt 151 lb 8 oz (68.7 kg)   BMI 25.60 kg/m   Constitutional:  Alert and oriented, No acute distress. HEENT: [place] AT, moist mucus membranes.  Trachea midline, no masses. Cardiovascular: No clubbing, cyanosis, or edema. Respiratory: Normal respiratory effort, no increased work of breathing. GI: Abdomen is soft, nontender, nondistended, no abdominal masses GU: Grade 1 hyper mobility of the bladder  neck. No stress incontinence with a good cough. Grade 1 cystocele. Moderate atrophy Skin: No rashes, bruises or suspicious lesions. Lymph: No cervical or inguinal adenopathy. Neurologic: Grossly intact, no focal deficits, moving all 4 extremities. Psychiatric: Normal mood and affect.  Laboratory Data: Lab Results  Component Value Date   WBC 7.0 10/12/2015   HGB 13.7 10/12/2015   HCT 39.9 10/12/2015   MCV 88.6 10/12/2015   PLT 182 10/12/2015    Lab Results  Component Value Date   CREATININE 1.10 03/26/2015    No results found for: PSA  No results found for: TESTOSTERONE  Lab Results  Component Value Date   HGBA1C 6.4 06/27/2015    Urinalysis    Component Value Date/Time   COLORURINE YELLOW (A) 01/17/2015 1327   APPEARANCEUR CLEAR (A) 01/17/2015 1327   LABSPEC 1.018 01/17/2015 1327   PHURINE 6.0 01/17/2015 1327   GLUCOSEU NEGATIVE 01/17/2015 1327   HGBUR 1+ (A) 01/17/2015 1327   HGBUR negative 01/01/2010 0858   BILIRUBINUR neg 12/13/2015 1058   KETONESUR TRACE (A) 01/17/2015 1327   PROTEINUR neg 12/13/2015 1058   PROTEINUR 30 (A) 01/17/2015 1327   UROBILINOGEN 0.2 12/13/2015 1058   UROBILINOGEN 0.2 01/01/2010 0858   NITRITE neg 12/13/2015 1058   NITRITE NEGATIVE  01/17/2015 1327   LEUKOCYTESUR Negative 12/13/2015 1058    Pertinent Imaging: none  Assessment & Plan:  He patient has mixed incontinence but by history primarily has urgency incontinence. She is moderate nighttime frequency. She has impressive urinary frequency. She looked utilizing a walker  Clinically I believe the patient primarily has an overactive bladder. I will start her on medical behavioral therapy. If she is not responding favorably to therapy I will order urodynamics  Vesicare 5 mg samples and prescription given.  Toviaz 8 mg samples and prescription were given. See the patient in 2 months  1. Other urinary incontinence 2. Mixed urinary incontinence 3. Urinary frequency 4. Nocturia   - Urinalysis, Complete - CULTURE, URINE COMPREHENSIVE   No Follow-up on file.  Reece Packer, MD  [facility] [address] [place], [place] [address] [phone]

## 2016-01-04 NOTE — Telephone Encounter (Signed)
Pt left /vm; pt was referred to see Dr Matilde Sprang and he is treating pt for overactive bladder; pt has bladder sling for 10 years and pt wants a second opinion. Pt request cb.

## 2016-01-07 LAB — CULTURE, URINE COMPREHENSIVE

## 2016-01-07 NOTE — Telephone Encounter (Signed)
Referral placed.

## 2016-01-30 DIAGNOSIS — R339 Retention of urine, unspecified: Secondary | ICD-10-CM | POA: Diagnosis not present

## 2016-01-30 DIAGNOSIS — Z87442 Personal history of urinary calculi: Secondary | ICD-10-CM | POA: Diagnosis not present

## 2016-01-30 DIAGNOSIS — N3281 Overactive bladder: Secondary | ICD-10-CM | POA: Diagnosis not present

## 2016-01-31 DIAGNOSIS — N393 Stress incontinence (female) (male): Secondary | ICD-10-CM

## 2016-01-31 DIAGNOSIS — R339 Retention of urine, unspecified: Secondary | ICD-10-CM | POA: Insufficient documentation

## 2016-01-31 DIAGNOSIS — N3281 Overactive bladder: Secondary | ICD-10-CM | POA: Insufficient documentation

## 2016-01-31 HISTORY — DX: Overactive bladder: N32.81

## 2016-01-31 HISTORY — DX: Stress incontinence (female) (male): N39.3

## 2016-01-31 HISTORY — DX: Retention of urine, unspecified: R33.9

## 2016-02-07 ENCOUNTER — Other Ambulatory Visit: Payer: Self-pay | Admitting: Internal Medicine

## 2016-02-19 DIAGNOSIS — D369 Benign neoplasm, unspecified site: Secondary | ICD-10-CM | POA: Diagnosis not present

## 2016-02-19 DIAGNOSIS — R197 Diarrhea, unspecified: Secondary | ICD-10-CM | POA: Diagnosis not present

## 2016-03-03 ENCOUNTER — Ambulatory Visit: Payer: Self-pay

## 2016-03-19 DIAGNOSIS — J449 Chronic obstructive pulmonary disease, unspecified: Secondary | ICD-10-CM | POA: Diagnosis not present

## 2016-03-20 ENCOUNTER — Other Ambulatory Visit: Payer: Self-pay | Admitting: Family Medicine

## 2016-03-20 ENCOUNTER — Other Ambulatory Visit: Payer: Self-pay | Admitting: Internal Medicine

## 2016-03-20 NOTE — Telephone Encounter (Signed)
Can Dr. Deborra Phillips refill her prescriptions for Gabapentin 100mg  #180 and Metformin 500mg  # 60?    Metformin has been filled previously by another physician (Dr. Cruzita Lederer); however, she is no longer going to be seeing them and would like for Dr. Deborra Phillips to take over refills.  Please advise and call patient to inform if refills can be sent in.

## 2016-03-21 ENCOUNTER — Other Ambulatory Visit: Payer: Self-pay | Admitting: Family Medicine

## 2016-03-21 MED ORDER — METFORMIN HCL 500 MG PO TABS: ORAL_TABLET | ORAL | 0 refills | Status: DC

## 2016-03-21 NOTE — Telephone Encounter (Signed)
Ok to refill as requested but she needs to follow up with me every 3 months for her diabetes if she is no longer seeing endo.

## 2016-03-21 NOTE — Telephone Encounter (Signed)
Spoke to pt and advised per Dr Deborra Medina. F/u appt scheduled

## 2016-03-21 NOTE — Telephone Encounter (Signed)
Pt will need OV with labs for #90. See additional phone note

## 2016-03-27 ENCOUNTER — Ambulatory Visit (INDEPENDENT_AMBULATORY_CARE_PROVIDER_SITE_OTHER): Payer: Medicare Other | Admitting: Family Medicine

## 2016-03-27 ENCOUNTER — Encounter: Payer: Self-pay | Admitting: Family Medicine

## 2016-03-27 VITALS — BP 136/76 | HR 63 | Temp 97.6°F | Wt 160.8 lb

## 2016-03-27 DIAGNOSIS — IMO0002 Reserved for concepts with insufficient information to code with codable children: Secondary | ICD-10-CM

## 2016-03-27 DIAGNOSIS — E1143 Type 2 diabetes mellitus with diabetic autonomic (poly)neuropathy: Secondary | ICD-10-CM

## 2016-03-27 DIAGNOSIS — E1165 Type 2 diabetes mellitus with hyperglycemia: Secondary | ICD-10-CM | POA: Diagnosis not present

## 2016-03-27 DIAGNOSIS — E038 Other specified hypothyroidism: Secondary | ICD-10-CM | POA: Diagnosis not present

## 2016-03-27 LAB — TSH: TSH: 8.62 u[IU]/mL — AB (ref 0.35–4.50)

## 2016-03-27 LAB — HEMOGLOBIN A1C: Hgb A1c MFr Bld: 9.1 % — ABNORMAL HIGH (ref 4.6–6.5)

## 2016-03-27 LAB — T4, FREE: FREE T4: 1.13 ng/dL (ref 0.60–1.60)

## 2016-03-27 NOTE — Progress Notes (Signed)
Subjective:   Patient ID: Lori Phillips, female    DOB: 28-Sep-1955, 60 y.o.   MRN: [id number]  Lori Phillips is a pleasant 60 y.o. year old female who presents to clinic today with Follow-up  on 03/27/2016  HPI:  Has been seeing endocrinology but diabetes and thyroid have been stable and wants me to resume care.  Currently taking Metformin 500 mg twice daily.  Lab Results  Component Value Date   HGBA1C 6.4 06/27/2015   Denies symptoms of hypo or hyperglycemia.  Hypothyroidism-  Clinically euthyroid on synthroid 100 mcg daily.  Lab Results  Component Value Date   TSH 0.20 (L) 06/27/2015      Current Outpatient Prescriptions on File Prior to Visit  Medication Sig Dispense Refill  . acetaminophen (TYLENOL) 650 MG CR tablet Take 650 mg by mouth 3 (three) times daily as needed for pain.    [place] Kitchen BIOTIN PO Take 1 tablet by mouth daily.    . carbidopa-levodopa (SINEMET IR) 25-100 MG tablet Take 1 tablet by mouth 4 (four) times daily. Can take it every 3-4 hours. 360 tablet 12  . CARTIA XT 180 MG 24 hr capsule TAKE 1 CAPSULE BY MOUTH EVERY DAY 90 capsule 1  . clonazePAM (KLONOPIN) 0.5 MG tablet Take 1 tablet (0.5 mg total) by mouth daily as needed. (Patient taking differently: Take 0.5 mg by mouth daily as needed for anxiety. ) 30 tablet 0  . diazepam (VALIUM) 5 MG tablet Take 2.5 mg by mouth at bedtime.     . divalproex (DEPAKOTE ER) 500 MG 24 hr tablet Take 1,000 mg by mouth daily.    . fesoterodine (TOVIAZ) 8 MG TB24 tablet Take 1 tablet (8 mg total) by mouth daily. 30 tablet 11  . gabapentin (NEURONTIN) 100 MG capsule LIMIT 1 TO 2 CAPSULES BY MOUTH TWICE TO THREE TIMES DAILY IF TOLERATED 180 capsule 0  . levothyroxine (SYNTHROID, LEVOTHROID) 100 MCG tablet TAKE 1 TABLET( 100 MCG) BY MOUTH DAILY 30 tablet 0  . lisinopril (PRINIVIL,ZESTRIL) 5 MG tablet TAKE 1 TABLET BY MOUTH EVERY DAY 90 tablet 1  . metFORMIN (GLUCOPHAGE) 500 MG tablet TAKE 1 TABLET(500 MG) BY MOUTH TWICE DAILY WITH  A MEAL 60 tablet 0  . nystatin cream (MYCOSTATIN) Apply 1 application topically 2 (two) times daily as needed (for sore).    . pantoprazole (PROTONIX) 40 MG tablet Take 1 tablet (40 mg total) by mouth daily. 90 tablet 3  . rosuvastatin (CRESTOR) 20 MG tablet TAKE 1 TABLET BY MOUTH DAILY. 30 tablet 11  . vitamin B-12 (CYANOCOBALAMIN) 500 MCG tablet Take 500 mcg by mouth at bedtime.     . [DISCONTINUED] TAZTIA XT 180 MG 24 hr capsule TAKE ONE CAPSULE BY MOUTH EVERY DAY 90 capsule 0   No current facility-administered medications on file prior to visit.     Allergies  Allergen Reactions  . Byetta 10 Mcg Pen [Exenatide] Nausea And Vomiting    Other reaction(s): Nausea And Vomiting  . Glyburide Other (See Comments)    Reaction:  Dizziness and confusion   . Seroquel [Quetiapine Fumarate] Diarrhea, Nausea And Vomiting and Other (See Comments)    Reaction:  Confusion     Past Medical History:  Diagnosis Date  . Allergy   . Anxiety   . Bipolar disorder ([place])   . CAD (coronary artery disease)    multiple caths  . Diabetes mellitus   . Enlarged liver   . Enlarged pituitary gland ([place])   .  Fibromyalgia   . Fibromyalgia   . Hyperlipidemia   . Hypertension   . Hypothyroidism   . Kidney stones   . Low back pain   . Nephrolithiasis   . Osteoarthritis   . Osteoporosis   . Panic attacks   . Parkinson's disease ([place]) 2016  . Rheumatoid arthritis ([place])   . Scoliosis   . Thyroid disease    hypothyroid    Past Surgical History:  Procedure Laterality Date  . BLADDER SURGERY     ?bladder sling   . CARDIAC CATHETERIZATION     x3  . COLONOSCOPY  multiple   2005 [place] records not available-polyps x 3  . COLONOSCOPY WITH PROPOFOL N/A 10/12/2015   Procedure: COLONOSCOPY WITH PROPOFOL;  Surgeon: Lollie Sails, MD;  Location: [facility] ENDOSCOPY;  Service: Endoscopy;  Laterality: N/A;  . ESOPHAGEAL DILATION    . kidney stone removal    . TONSILLECTOMY    . VAGINAL HYSTERECTOMY       Family History  Problem Relation Age of Onset  . Hypertension Mother   . Diabetes Mother   . Bipolar disorder Father   . Hypertension Father   . Colon cancer Paternal Aunt   . Colon cancer Paternal Grandmother   . Thyroid disease Neg Hx   . Breast cancer Neg Hx     Social History   Social History  . Marital status: Single    Spouse name: N/A  . Number of children: 3  . Years of education: 12   Occupational History  . disability  Other   Social History Main Topics  . Smoking status: Never Smoker  . Smokeless tobacco: Never Used  . Alcohol use No  . Drug use: No  . Sexual activity: Not on file   Other Topics Concern  . Not on file   Social History Narrative   Has a living will- desires CPR but no prolonged life support if futile.   Caffeine use: Drinks 2 L soda per day   Drinks 2 glasses tea/day   The PMH, PSH, Social History, Family History, Medications, and allergies have been reviewed in [facility], and have been updated if relevant.   Review of Systems  Constitutional: Negative.   Eyes: Negative.   Respiratory: Negative.   Cardiovascular: Negative.   Gastrointestinal: Negative.   Endocrine: Negative.   Genitourinary: Negative.   Musculoskeletal: Negative.   Neurological: Negative.   Hematological: Negative.   All other systems reviewed and are negative.      Objective:    BP 136/76   Pulse 63   Temp 97.6 F (36.4 C) ([place])   Wt 160 lb 12 oz (72.9 kg)   SpO2 98%   BMI 27.17 kg/m    Physical Exam  Constitutional: She is oriented to person, place, and time. She appears well-developed and well-nourished. No distress.  HENT:  Head: Normocephalic and atraumatic.  Eyes: Conjunctivae are normal.  Cardiovascular: Normal rate and regular rhythm.   Pulmonary/Chest: Effort normal and breath sounds normal.  Musculoskeletal: Normal range of motion.  Neurological: She is alert and oriented to person, place, and time. No cranial nerve deficit.  Skin: Skin  is warm and dry. She is not diaphoretic.  Psychiatric: She has a normal mood and affect. Her behavior is normal. Judgment and thought content normal.  Nursing note and vitals reviewed.         Assessment & Plan:   Type 2 diabetes, uncontrolled, with gastroparesis ([place]) - Plan: Hemoglobin A1c  Other specified hypothyroidism - Plan: TSH, T4, Free No Follow-up on file.

## 2016-03-27 NOTE — Assessment & Plan Note (Signed)
Continue current dose of metformin. Check a1c today.

## 2016-03-27 NOTE — Progress Notes (Signed)
Pre visit review using our clinic review tool, if applicable. No additional management support is needed unless otherwise documented below in the visit note. 

## 2016-03-27 NOTE — Patient Instructions (Signed)
Great to see you. We will call you with your results from today. 

## 2016-03-27 NOTE — Assessment & Plan Note (Signed)
Continue current dose of synthroid. Recheck labs since last TSH was low and I am now resuming care. The patient indicates understanding of these issues and agrees with the plan.

## 2016-05-02 ENCOUNTER — Other Ambulatory Visit: Payer: Self-pay | Admitting: Family Medicine

## 2016-05-02 ENCOUNTER — Other Ambulatory Visit: Payer: Self-pay | Admitting: Internal Medicine

## 2016-05-02 NOTE — Telephone Encounter (Signed)
Last labs abnormal. pls advise 

## 2016-05-05 NOTE — Telephone Encounter (Signed)
OK to refills for 2 mo but needs appt w/in this period.

## 2016-05-05 NOTE — Telephone Encounter (Signed)
Okay to refill? Patient not seen since 06/2015. Please advise. Thank you!

## 2016-05-06 ENCOUNTER — Other Ambulatory Visit: Payer: Self-pay

## 2016-05-06 ENCOUNTER — Other Ambulatory Visit: Payer: Self-pay | Admitting: Internal Medicine

## 2016-05-06 MED ORDER — LEVOTHYROXINE SODIUM 100 MCG PO TABS: ORAL_TABLET | ORAL | 1 refills | Status: DC

## 2016-06-01 ENCOUNTER — Emergency Department
Admission: EM | Admit: 2016-06-01 | Discharge: 2016-06-01 | Disposition: A | Discharge status: Home / Self Care | Payer: Medicare Other | Attending: Emergency Medicine | Admitting: Emergency Medicine

## 2016-06-01 ENCOUNTER — Other Ambulatory Visit: Payer: Self-pay | Admitting: Family Medicine

## 2016-06-01 DIAGNOSIS — G20 Parkinson's disease: Secondary | ICD-10-CM | POA: Insufficient documentation

## 2016-06-01 DIAGNOSIS — I10 Essential (primary) hypertension: Secondary | ICD-10-CM | POA: Diagnosis not present

## 2016-06-01 DIAGNOSIS — Z79899 Other long term (current) drug therapy: Secondary | ICD-10-CM | POA: Insufficient documentation

## 2016-06-01 DIAGNOSIS — E039 Hypothyroidism, unspecified: Secondary | ICD-10-CM | POA: Diagnosis not present

## 2016-06-01 DIAGNOSIS — Z5321 Procedure and treatment not carried out due to patient leaving prior to being seen by health care provider: Secondary | ICD-10-CM | POA: Diagnosis not present

## 2016-06-01 DIAGNOSIS — I2510 Atherosclerotic heart disease of native coronary artery without angina pectoris: Secondary | ICD-10-CM | POA: Diagnosis not present

## 2016-06-01 DIAGNOSIS — E1165 Type 2 diabetes mellitus with hyperglycemia: Secondary | ICD-10-CM | POA: Diagnosis not present

## 2016-06-01 LAB — CBC
HCT: 40.8 % (ref 35.0–47.0)
HEMOGLOBIN: 13.5 g/dL (ref 12.0–16.0)
MCH: 31 pg (ref 26.0–34.0)
MCHC: 33.2 g/dL (ref 32.0–36.0)
MCV: 93.6 fL (ref 80.0–100.0)
Platelets: 234 10*3/uL (ref 150–440)
RBC: 4.36 MIL/uL (ref 3.80–5.20)
RDW: 13.5 % (ref 11.5–14.5)
WBC: 11.1 10*3/uL — ABNORMAL HIGH (ref 3.6–11.0)

## 2016-06-01 LAB — BASIC METABOLIC PANEL
ANION GAP: 12 (ref 5–15)
BUN: 18 mg/dL (ref 6–20)
CALCIUM: 9.3 mg/dL (ref 8.9–10.3)
CHLORIDE: 99 mmol/L — AB (ref 101–111)
CO2: 25 mmol/L (ref 22–32)
CREATININE: 1.11 mg/dL — AB (ref 0.44–1.00)
GFR calc Af Amer: >60 mL/min (ref >60)
GFR calc non Af Amer: 52 mL/min — ABNORMAL LOW (ref >60)
GLUCOSE: 371 mg/dL — AB (ref 65–99)
POTASSIUM: 4.2 mmol/L (ref 3.5–5.1)
Sodium: 136 mmol/L (ref 135–145)

## 2016-06-01 LAB — GLUCOSE, CAPILLARY
GLUCOSE-CAPILLARY: 335 mg/dL — AB (ref 65–99)
Glucose-Capillary: 332 mg/dL — ABNORMAL HIGH (ref 65–99)

## 2016-06-01 NOTE — ED Triage Notes (Signed)
Pt states blood sugar of 471 at home pta. Pt states takes metformin. Pt denies other symptoms. Pt appears in no acute distress.

## 2016-06-02 ENCOUNTER — Telehealth: Payer: Self-pay

## 2016-06-02 ENCOUNTER — Ambulatory Visit: Payer: Medicare Other | Admitting: Endocrinology

## 2016-06-02 ENCOUNTER — Telehealth: Payer: Self-pay | Admitting: Internal Medicine

## 2016-06-02 NOTE — Telephone Encounter (Signed)
Advised with Dr.Kumar while Dr.Gherghe is out. He asked to see her today at 3:00. We scheduled her and she will be here. Notified Dr.Kumar.

## 2016-06-02 NOTE — Telephone Encounter (Signed)
Please advise in Dr.Gherghe's absence today, on what to tell this patient. Thank you!

## 2016-06-02 NOTE — Telephone Encounter (Signed)
To be seen this afternoon.

## 2016-06-02 NOTE — Telephone Encounter (Signed)
Last labs abnormal. pls advise 

## 2016-06-02 NOTE — Telephone Encounter (Signed)
Pt called in and was saying that she was just recently in the hospital due to her diabetes and was told she needs to follow up with Dr. Cruzita Lederer.  She said that this morning her sugar was still above 200, and she wants to know what she needs to do.  The first available appointment isn't until April. Please advise.

## 2016-06-03 NOTE — Telephone Encounter (Signed)
Can we place this patient on the waiting list to see her. She seen Dr.Kumar yesterday while Dr.Gherghe was out; but Dr.Gherghe needs to see her. Thank you!

## 2016-06-03 NOTE — Telephone Encounter (Signed)
Scheduled with Cruzita Lederer

## 2016-06-03 NOTE — Telephone Encounter (Signed)
This patient was lost for follow-up. I have not seen her in almost a year. I will need to see her to adjust her medicines. We can put her on the waiting list if something comes up, since my schedule is completely full in the next 2 weeks.

## 2016-06-03 NOTE — Telephone Encounter (Signed)
Patient is scheduled. 07/01/16 10:00 put her her on waiting list.

## 2016-06-04 ENCOUNTER — Ambulatory Visit: Payer: Medicare Other | Admitting: Endocrinology

## 2016-06-05 ENCOUNTER — Telehealth: Payer: Self-pay

## 2016-06-05 DIAGNOSIS — E1165 Type 2 diabetes mellitus with hyperglycemia: Secondary | ICD-10-CM

## 2016-06-05 NOTE — Telephone Encounter (Signed)
I am not sure if anyone can see her sooner or if we can call over to endo to try to help.  Will place referral and route to [facility].

## 2016-06-05 NOTE — Telephone Encounter (Signed)
Pt left v/m requesting referral for endocrinologist in [place]. Pt had been seeing Dr Cruzita Lederer but pt recently in ED for 471 BS and cannot get appt with Dr Cruzita Lederer for one month. Now pts BS is averaging 200. Pt request cb.

## 2016-06-05 NOTE — Telephone Encounter (Signed)
Called [facility] and they had given an appt to the patient on 06/02/16 with Dr Dwyane Dee which the patient called and cancelled and then made an appt with Dr Cruzita Lederer on 07/01/16. They have her on a cancellation list but they have nothing sooner to offer her at this time. I called the patient and had to leave her a VM to call me back.I doubt she will get in anywhere sooner than 07/01/16.

## 2016-06-05 NOTE — Telephone Encounter (Signed)
Ok thank you 

## 2016-06-10 ENCOUNTER — Encounter: Payer: Self-pay | Admitting: Neurology

## 2016-06-10 ENCOUNTER — Ambulatory Visit (INDEPENDENT_AMBULATORY_CARE_PROVIDER_SITE_OTHER): Payer: Medicare Other | Admitting: Neurology

## 2016-06-10 DIAGNOSIS — G2119 Other drug induced secondary parkinsonism: Secondary | ICD-10-CM | POA: Diagnosis not present

## 2016-06-10 NOTE — Progress Notes (Signed)
[id number]    Provider:  Dr Lori Phillips Referring Provider: Lucille Passy, MD Primary Care Physician:  Lori Norris, MD  CC: Parkinsonism  Interval history 06/10/2016: DAT scan was negative/normal. Parkinsonism possibly due to neuroleptics. She is walking better. She uses a cane out of the house. No tremor. She stopped the Sinemet No tremors. Feeling better. No falls. She has come close a "time or two" in the house. She had a home safety inspection. More when she turns fast and she is aware of that. Improving. She appears well today. Appetite is good mood is good. Discussed medication-induced parkinsonism, treatment and differential.  Interval history 06/26/2015:  She is here for follow up of Parkinsonism. She was taken off of the Abilify in September.  Dr. Jacqualine Phillips. She feels the sinemet kicking in and feels the medicine wearing off and feels it really helps with tremor. She feels more steady now. Discussed parkinsonism vs parkinson's disease. They would like to proceed with DaTScan. Discussed the test, gave literature. Also reviewed MRI of the brain, If DaTScan negative may consider high volume lumbar tap, venticulomegaly.  Interval history 02/22/2015: Patient was not taking medication properly. She was hospitalized and sent to rehab. She got a lot better at physical therapy. Daughter has taken over medication management. Things are going much better. She is still unsteady on her feet, patient's daughter is staying home the next 12 weeks. I discussed that I recommend 24 hour facility care or a caretaker. The carbidopa/levodopa. Discussed separating the pill from protein. Her tone is improved on the Sinemet. Her gait and walking is improved as well. She is still at a slow pace and very unsteady and dazed and confused at times. She was taken off of the Abilify. She has an enlarged pituitary gland and has not had an MRI in years. TSH was significantly elevated 2 months ago, B12 was normal  in March. Last HgbA1c 8.7, recommend f/u for diabetes management and she has been recommended to another doctor but unclear, recommend f/u with Dr. Deborra Phillips or doctor she was referred to.   HPI: Lori Phillips is a 61 y.o. female here as a referral from Dr. Deborra Phillips for Parkinsonism. She has been evaluated by multiple neurologists and apparently at [facility] and been diagnosed with Parkinsonism. She also has a PMHx of UTI, generalized weakness, DM, chronic pain utilizing pain medication, HTN, CAD, anxiety, bipolar disorder, polysubstabce abuse, a suicide attempt, Fibromyalgia and reported 57-month period when patient was bed bound that daughter thinks may have contributed to symptoms due to disuse. She is Bipolar and is on dopamine-blocking agent and daughter thinks that is may be possible that her parkinsonism started about the same time as the Abilify.    She feels she has seen improvement recently. She is moving better. She has had multiple medication changes and she feels like she is more mobile. Neurontin and Depakote were managed. Her medications were decreased. Her hgba1c is better controlled. She is participating in physical therapy. She has memory loss. She has been diagnosed with parkinsonism. She was in bed for about 8 months starting last summer. She started going to see a doctor about back pain, it helped but after that it didn't and she was in bed because of pain. She started getting out of bed when she went to the rehab center in June. She started getting physical therapy which helped. She has home health care. She is getting occupational therapy and speech therapy. She still feels weak but she  is getting better. She is more alert than what she had been as well. Continuously improving. Her tremor is better since changing the Depakote and starting the sinemet. Her sinemet has been increased. Volume of voice is stable. Some drooling on the left side of the face. The shuffling gait is improving. She can take  bigger steps when she is told. Smell and taste is good. She is on Abilify. Tried Seroquel in the past. She has depression, memory changes. No hallucinations. She doesn't feel the Sinemet kick in. The tremors were in both hands. Takes the Sinemet 3x a day and feels the medication wearing off. She knows it is time and needs her sinemet. Tremor is also in the hands and legs. When she tries to take a step her legs tremble.Symptoms started around the time she started taking the Abilify.   Reviewed notes, labs and imaging from outside physicians, which showed:  CT of the head showed non-specific white matter changes and atrophy which I think is more advanced than stated age. Personally reviewed.  Hemoglobin A1c 7.3, TSH elevated with normal free T4, CMP unremarkable, B12 was 979 but was 292 a year ago  Review of Systems: Patient complains of symptoms per HPI as well as the following symptoms: Activity change, appetite change, chills, fatigue, cold intolerance, flushing, black stools, diarrhea, constant valves, leg swelling, incontinence of bladder, urine decreased, joint pain, apnea, frequently awakening, daytime sleepiness, joint pain, back pain, aching muscles, walking difficulty, neck stiffness, memory loss, speech difficulty, weakness, tremors, facial drooping, confusion, decreased concentration, depression . Pertinent negatives per HPI. All others negative.   Social History   Social History  . Marital status: Single    Spouse name: N/A  . Number of children: 3  . Years of education: 12   Occupational History  . disability  Other   Social History Main Topics  . Smoking status: Never Smoker  . Smokeless tobacco: Never Used  . Alcohol use No  . Drug use: No  . Sexual activity: Not on file   Other Topics Concern  . Not on file   Social History Narrative   Has a living will- desires CPR but no prolonged life support if futile.   Caffeine use: Drinks 2 L soda per day   Drinks 2  glasses tea/day   Right-handed    Family History  Problem Relation Age of Onset  . Hypertension Mother   . Diabetes Mother   . Bipolar disorder Father   . Hypertension Father   . Colon cancer Paternal Aunt   . Colon cancer Paternal Grandmother   . Thyroid disease Neg Hx   . Breast cancer Neg Hx     Past Medical History:  Diagnosis Date  . Allergy   . Anxiety   . Bipolar disorder ([place])   . CAD (coronary artery disease)    multiple caths  . Diabetes mellitus   . Enlarged liver   . Enlarged pituitary gland ([place])   . Fibromyalgia   . Fibromyalgia   . Hyperlipidemia   . Hypertension   . Hypothyroidism   . Kidney stones   . Low back pain   . Nephrolithiasis   . Osteoarthritis   . Osteoporosis   . Panic attacks   . Parkinsonism ([place]) 2016  . Rheumatoid arthritis ([place])   . Scoliosis   . Thyroid disease    hypothyroid    Past Surgical History:  Procedure Laterality Date  . BLADDER SURGERY     ?bladder  sling   . CARDIAC CATHETERIZATION     x3  . COLONOSCOPY  multiple   2005 [place] [facility] records not available-polyps x 3  . COLONOSCOPY WITH PROPOFOL N/A 10/12/2015   Procedure: COLONOSCOPY WITH PROPOFOL;  Surgeon: Lollie Sails, MD;  Location: [facility] ENDOSCOPY;  Service: Endoscopy;  Laterality: N/A;  . ESOPHAGEAL DILATION    . kidney stone removal    . TONSILLECTOMY    . VAGINAL HYSTERECTOMY      Current Outpatient Prescriptions  Medication Sig Dispense Refill  . acetaminophen (TYLENOL) 650 MG CR tablet Take 650 mg by mouth 3 (three) times daily as needed for pain.    Marland Kitchen BIOTIN PO Take 1 tablet by mouth daily.    Marland Kitchen CARTIA XT 180 MG 24 hr capsule TAKE 1 CAPSULE BY MOUTH EVERY DAY 90 capsule 0  . clonazePAM (KLONOPIN) 0.5 MG tablet Take 1 tablet (0.5 mg total) by mouth daily as needed. (Patient taking differently: Take 0.5 mg by mouth daily as needed for anxiety. ) 30 tablet 0  . diazepam (VALIUM) 5 MG tablet Take 2.5 mg by mouth at bedtime.     . divalproex  (DEPAKOTE ER) 500 MG 24 hr tablet Take 1,000 mg by mouth daily.    Marland Kitchen gabapentin (NEURONTIN) 100 MG capsule LIMIT 1 TO 2 CAPSULES BY MOUTH TWICE TO THREE TIMES DAILY IF TOLERATED 180 capsule 0  . levothyroxine (SYNTHROID, LEVOTHROID) 100 MCG tablet TAKE 1 TABLET( 100 MCG) BY MOUTH DAILY 30 tablet 1  . lisinopril (PRINIVIL,ZESTRIL) 5 MG tablet TAKE 1 TABLET BY MOUTH EVERY DAY 90 tablet 1  . metFORMIN (GLUCOPHAGE) 500 MG tablet TAKE 1 TABLET(500 MG) BY MOUTH TWICE DAILY WITH A MEAL 60 tablet 0  . MYRBETRIQ 50 MG TB24 tablet     . nystatin cream (MYCOSTATIN) Apply 1 application topically 2 (two) times daily as needed (for sore).    . pantoprazole (PROTONIX) 40 MG tablet Take 1 tablet (40 mg total) by mouth daily. 90 tablet 3  . rosuvastatin (CRESTOR) 20 MG tablet TAKE 1 TABLET BY MOUTH DAILY. 30 tablet 11  . vitamin B-12 (CYANOCOBALAMIN) 500 MCG tablet Take 500 mcg by mouth at bedtime.      No current facility-administered medications for this visit.     Allergies as of 06/10/2016 - Review Complete 06/10/2016  Allergen Reaction Noted  . Glyburide Other (See Comments) 08/15/2014  . Seroquel [quetiapine fumarate] Diarrhea, Nausea And Vomiting, and Other (See Comments) 08/15/2014  . Byetta 10 mcg pen [exenatide] Nausea And Vomiting 10/03/2011    Vitals: Ht 5\' 4"  (1.626 m)   Wt 160 lb 12.8 oz (72.9 kg)   BMI 27.60 kg/m  Last Weight:  Wt Readings from Last 1 Encounters:  06/10/16 160 lb 12.8 oz (72.9 kg)   Last Height:   Ht Readings from Last 1 Encounters:  06/10/16 5\' 4"  (1.626 m)     Cranial Nerves:  The pupils are equal, round, and reactive to light. Visual fields are full to finger confrontation. Extraocular movements are intact. Trigeminal sensation is intact and the muscles of mastication are normal. The face is symmetric. The palate elevates in the midline. Hearing intact. Voice is normal. Shoulder shrug is normal. The tongue has normal motion without fasciculations.    Coordination:  Slowed but not dysmetric. Some mild bradykinesia on finger tapping bilaterally  Gait:  Can get up without using arms. Dec arm swing, slight imbalance but not shuffling, mildly stooped, good stance width  Motor Observation: No  resting or postural tremor.    Tone: Normal  Posture:  Stooped   Strength: strength is V/V in the upper and lower limbs.    Sensation: intact to LT   Reflex Exam:  DTR's:  Deep tendon reflexes uppers and 1+ lower extremities      Assessment/Plan: Lori Phillips is a 61 y.o. female here as a referral from Dr. Deborra Phillips for Parkinsonism.  She also has a PMHx of UTI, generalized weakness, DM, chronic pain utilizing pain medication, HTN, CAD, anxiety, bipolar disorder and reported 44-month period when patient was bed bound that daughter thinks may have contributed to symptoms due to disuse. She is Bipolar and was on long-term dopamine-blocking agent and daughter thinks that is may be possible that her parkinsonism started about the same time as the Abilify, unsure.   I suspect the etiology of her symptoms is multifactorial. She does have parkinsonian symptoms. Likely medication induced in this patient. There are multiple causes of parkinsonism including dopamine-blocking agents such as the Abilify she was  taking, parkinson's plus disorders (psp, msa and others), lewy-body dementia or other neurodegenerative disorders. I did recommend following up with her psychiatrist to see if the Abilify can be changed but it appears she has a long history of mood disorder and has tried multiple medications already.   MRi of the brain -  Abnormal MRI scan of the brain showing moderate degree of generalized cerebral atrophy. Thin sections through the pituitary do not show enlargement or definite adenoma. Ventriculomegaly with assymmetric lateral ventricles.  DAT Scan: Negative   Addendum 1128: Records from [facility]  received. Carotid Dopplers were performed and impression on the left no hemodynamically significant stenosis. On the right no hemodynamically significant stenosis. MRI of the cervical spine showed no focal canal or foraminal stenosis. Normal cord signal. She was seen by Dr. Deborra Phillips for extremity weakness and tremors. This is dated 07/06/2014. Extremity started 3 months ago with weakness which was progressively worsening. Patient using a walker with difficulty walking. Left-sided facial droop with drooling. Also slurred speech. She has stopped her aspirin for a week prior to an epidural spinal injection. Tremor in the hands for about 3 months. Exam significant for intact strength, left lower facial droop otherwise normal cranial nerve exam, normal reflexes. The time they performed a brain MRI to evaluate for stroke, carotid ultrasound to evaluate for carotid artery stenosis. The results of the MRI of the brain were not included in these records. A note was made for referral to neurology but no neurology records found in the [facility].  Sarina Ill, MD  [facility] [address] [place], [place] [address]  Phone [phone] Fax [phone]  A total of 25 minutes was spent face-to-face with this patient. Over half this time was spent on counseling patient on the Parkinsonism diagnosis and different diagnostic and therapeutic options available.

## 2016-06-10 NOTE — Patient Instructions (Signed)

## 2016-06-11 DIAGNOSIS — G2119 Other drug induced secondary parkinsonism: Secondary | ICD-10-CM | POA: Insufficient documentation

## 2016-06-16 ENCOUNTER — Other Ambulatory Visit: Payer: Self-pay | Admitting: Family Medicine

## 2016-06-29 ENCOUNTER — Other Ambulatory Visit: Payer: Self-pay | Admitting: Family Medicine

## 2016-06-29 ENCOUNTER — Other Ambulatory Visit: Payer: Self-pay | Admitting: Internal Medicine

## 2016-07-01 ENCOUNTER — Ambulatory Visit: Payer: Medicare Other | Admitting: Internal Medicine

## 2016-07-30 DIAGNOSIS — E1140 Type 2 diabetes mellitus with diabetic neuropathy, unspecified: Secondary | ICD-10-CM | POA: Diagnosis not present

## 2016-07-30 DIAGNOSIS — E781 Pure hyperglyceridemia: Secondary | ICD-10-CM | POA: Diagnosis not present

## 2016-07-30 DIAGNOSIS — E039 Hypothyroidism, unspecified: Secondary | ICD-10-CM | POA: Diagnosis not present

## 2016-07-30 DIAGNOSIS — Z794 Long term (current) use of insulin: Secondary | ICD-10-CM | POA: Diagnosis not present

## 2016-08-02 ENCOUNTER — Other Ambulatory Visit: Payer: Self-pay | Admitting: Family Medicine

## 2016-08-04 ENCOUNTER — Other Ambulatory Visit: Payer: Self-pay | Admitting: Family Medicine

## 2016-08-04 NOTE — Telephone Encounter (Signed)
Pt wants to know status of cartia rx;[facility] received rx and will get it ready; pt will call pharmacy before picking up rxs.

## 2016-09-17 ENCOUNTER — Other Ambulatory Visit: Payer: Self-pay | Admitting: Family Medicine

## 2016-09-25 ENCOUNTER — Ambulatory Visit: Payer: Medicare Other

## 2016-10-02 ENCOUNTER — Encounter: Payer: Medicare Other | Admitting: Family Medicine

## 2016-10-02 ENCOUNTER — Ambulatory Visit (INDEPENDENT_AMBULATORY_CARE_PROVIDER_SITE_OTHER): Payer: Medicare Other

## 2016-10-02 VITALS — BP 118/82 | HR 72 | Temp 97.9°F | Ht 65.0 in | Wt 151.8 lb

## 2016-10-02 DIAGNOSIS — Z114 Encounter for screening for human immunodeficiency virus [HIV]: Secondary | ICD-10-CM

## 2016-10-02 DIAGNOSIS — E1143 Type 2 diabetes mellitus with diabetic autonomic (poly)neuropathy: Secondary | ICD-10-CM | POA: Diagnosis not present

## 2016-10-02 DIAGNOSIS — E784 Other hyperlipidemia: Secondary | ICD-10-CM

## 2016-10-02 DIAGNOSIS — E7849 Other hyperlipidemia: Secondary | ICD-10-CM

## 2016-10-02 DIAGNOSIS — Z1159 Encounter for screening for other viral diseases: Secondary | ICD-10-CM | POA: Diagnosis not present

## 2016-10-02 DIAGNOSIS — I10 Essential (primary) hypertension: Secondary | ICD-10-CM

## 2016-10-02 LAB — COMPREHENSIVE METABOLIC PANEL
ALBUMIN: 4.7 g/dL (ref 3.5–5.2)
ALT: 13 U/L (ref 0–35)
AST: 13 U/L (ref 0–37)
Alkaline Phosphatase: 65 U/L (ref 39–117)
BUN: 25 mg/dL — ABNORMAL HIGH (ref 6–23)
CALCIUM: 10.2 mg/dL (ref 8.4–10.5)
CHLORIDE: 104 meq/L (ref 96–112)
CO2: 24 mEq/L (ref 19–32)
Creatinine, Ser: 1.21 mg/dL — ABNORMAL HIGH (ref 0.40–1.20)
GFR: 47.72 mL/min — AB (ref >60.00)
Glucose, Bld: 265 mg/dL — ABNORMAL HIGH (ref 70–99)
POTASSIUM: 4.4 meq/L (ref 3.5–5.1)
Sodium: 139 mEq/L (ref 135–145)
Total Bilirubin: 0.3 mg/dL (ref 0.2–1.2)
Total Protein: 7.5 g/dL (ref 6.0–8.3)

## 2016-10-02 LAB — LIPID PANEL
CHOLESTEROL: 116 mg/dL (ref 0–200)
HDL: 36.2 mg/dL — ABNORMAL LOW (ref >39.00)
NONHDL: 79.9
TRIGLYCERIDES: 271 mg/dL — AB (ref 0.0–149.0)
Total CHOL/HDL Ratio: 3
VLDL: 54.2 mg/dL — ABNORMAL HIGH (ref 0.0–40.0)

## 2016-10-02 LAB — HEMOGLOBIN A1C: Hgb A1c MFr Bld: 8.6 % — ABNORMAL HIGH (ref 4.6–6.5)

## 2016-10-02 LAB — LDL CHOLESTEROL, DIRECT: Direct LDL: 45 mg/dL

## 2016-10-02 LAB — TSH: TSH: 0.02 u[IU]/mL — AB (ref 0.35–4.50)

## 2016-10-02 LAB — T4, FREE: Free T4: 2.12 ng/dL — ABNORMAL HIGH (ref 0.60–1.60)

## 2016-10-02 NOTE — Progress Notes (Signed)
PCP notes:   Health maintenance:  HIV screening - completed Hep C screening - completed A1C - completed Tetanus - postponed/insurance Eye exam - postponed/financial Foot exam - PCP please address at next exam  Abnormal screenings:   Mini-Cog score: 18/20  Patient concerns:   Pt reports soreness to sole of right foot and patches of dry, flaky skin to upper body.   Nurse concerns:  None  Next PCP appt:   10/09/16 @ 0915

## 2016-10-02 NOTE — Progress Notes (Signed)
Pre visit review using our clinic review tool, if applicable. No additional management support is needed unless otherwise documented below in the visit note. 

## 2016-10-02 NOTE — Patient Instructions (Signed)
Ms. Warburton , Thank you for taking time to come for your Medicare Wellness Visit. I appreciate your ongoing commitment to your health goals. Please review the following plan we discussed and let me know if I can assist you in the future.   These are the goals we discussed: Goals    . Increase water intake          Starting 10/02/2016, I will continue to drink at least 6-8 glasses of water daily.        This is a list of the screening recommended for you and due dates:  Health Maintenance  Topic Date Due  . Complete foot exam   10/09/2016*  . Eye exam for diabetics  10/02/2017*  . Tetanus Vaccine  10/03/2026*  . Flu Shot  11/19/2016  . Hemoglobin A1C  04/03/2017  . Mammogram  08/21/2017  . Pneumococcal vaccine (2) 08/27/2018  . Colon Cancer Screening  10/12/2018  . Colon Cancer Screening  11/18/2020  .  Hepatitis C: One time screening is recommended by Center for Disease Control  (CDC) for  adults born from [birth year] through 1967.   Completed  . HIV Screening  Completed  *Topic was postponed. The date shown is not the original due date.   Preventive Care for Adults  A healthy lifestyle and preventive care can promote health and wellness. Preventive health guidelines for adults include the following key practices.  . A routine yearly physical is a good way to check with your health care provider about your health and preventive screening. It is a chance to share any concerns and updates on your health and to receive a thorough exam.  . Visit your dentist for a routine exam and preventive care every 6 months. Brush your teeth twice a day and floss once a day. Good oral hygiene prevents tooth decay and gum disease.  . The frequency of eye exams is based on your age, health, family medical history, use  of contact lenses, and other factors. Follow your health care provider's ecommendations for frequency of eye exams.  . Eat a healthy diet. Foods like vegetables, fruits, whole grains,  low-fat dairy products, and lean protein foods contain the nutrients you need without too many calories. Decrease your intake of foods high in solid fats, added sugars, and salt. Eat the right amount of calories for you. Get information about a proper diet from your health care provider, if necessary.  . Regular physical exercise is one of the most important things you can do for your health. Most adults should get at least 150 minutes of moderate-intensity exercise (any activity that increases your heart rate and causes you to sweat) each week. In addition, most adults need muscle-strengthening exercises on 2 or more days a week.  Silver Sneakers may be a benefit available to you. To determine eligibility, you may visit the website: www.silversneakers.com or contact program at (678)650-7373 Mon-Fri between 8AM-8PM.   . Maintain a healthy weight. The body mass index (BMI) is a screening tool to identify possible weight problems. It provides an estimate of body fat based on height and weight. Your health care provider can find your BMI and can help you achieve or maintain a healthy weight.   For adults 20 years and older: ? A BMI below 18.5 is considered underweight. ? A BMI of 18.5 to 24.9 is normal. ? A BMI of 25 to 29.9 is considered overweight. ? A BMI of 30 and above is considered obese.   Marland Kitchen  Maintain normal blood lipids and cholesterol levels by exercising and minimizing your intake of saturated fat. Eat a balanced diet with plenty of fruit and vegetables. Blood tests for lipids and cholesterol should begin at age 43 and be repeated every 5 years. If your lipid or cholesterol levels are high, you are over 50, or you are at high risk for heart disease, you may need your cholesterol levels checked more frequently. Ongoing high lipid and cholesterol levels should be treated with medicines if diet and exercise are not working.  . If you smoke, find out from your health care provider how to quit. If  you do not use tobacco, please do not start.  . If you choose to drink alcohol, please do not consume more than 2 drinks per day. One drink is considered to be 12 ounces (355 mL) of beer, 5 ounces (148 mL) of wine, or 1.5 ounces (44 mL) of liquor.  . If you are 34-28 years old, ask your health care provider if you should take aspirin to prevent strokes.  . Use sunscreen. Apply sunscreen liberally and repeatedly throughout the day. You should seek shade when your shadow is shorter than you. Protect yourself by wearing long sleeves, pants, a wide-brimmed hat, and sunglasses year round, whenever you are outdoors.  . Once a month, do a whole body skin exam, using a mirror to look at the skin on your back. Tell your health care provider of new moles, moles that have irregular borders, moles that are larger than a pencil eraser, or moles that have changed in shape or color.

## 2016-10-02 NOTE — Progress Notes (Signed)
I reviewed health advisor's note, was available for consultation, and agree with documentation and plan.  

## 2016-10-02 NOTE — Progress Notes (Signed)
Subjective:   Lori Phillips is a 61 y.o. female who presents for Medicare Annual (Subsequent) preventive examination.  Review of Systems:  N/A Cardiac Risk Factors include: advanced age (>75men, >43 women);diabetes mellitus;dyslipidemia;hypertension     Objective:     Vitals: BP 118/82 (BP Location: Right Arm, Patient Position: Sitting, Cuff Size: Normal)   Pulse 72   Temp 97.9 F (36.6 C) (Oral)   Ht 5\' 5"  (1.651 m) Comment: shoes  Wt 151 lb 12 oz (68.8 kg)   SpO2 98%   BMI 25.25 kg/m   Body mass index is 25.25 kg/m.   Tobacco History  Smoking Status  . Never Smoker  Smokeless Tobacco  . Never Used     Counseling given: No   Past Medical History:  Diagnosis Date  . Allergy   . Anxiety   . Bipolar disorder ([place])   . CAD (coronary artery disease)    multiple caths  . Diabetes mellitus   . Enlarged liver   . Enlarged pituitary gland ([place])   . Fibromyalgia   . Fibromyalgia   . Hyperlipidemia   . Hypertension   . Hypothyroidism   . Kidney stones   . Low back pain   . Nephrolithiasis   . Osteoarthritis   . Osteoporosis   . Panic attacks   . Parkinsonism ([place]) 2016  . Rheumatoid arthritis ([place])   . Scoliosis   . Thyroid disease    hypothyroid   Past Surgical History:  Procedure Laterality Date  . BLADDER SURGERY     ?bladder sling   . CARDIAC CATHETERIZATION     x3  . COLONOSCOPY  multiple   2005 [place] records not available-polyps x 3  . COLONOSCOPY WITH PROPOFOL N/A 10/12/2015   Procedure: COLONOSCOPY WITH PROPOFOL;  Surgeon: Lollie Sails, MD;  Location: [facility] ENDOSCOPY;  Service: Endoscopy;  Laterality: N/A;  . ESOPHAGEAL DILATION    . kidney stone removal    . TONSILLECTOMY    . VAGINAL HYSTERECTOMY     Family History  Problem Relation Age of Onset  . Hypertension Mother   . Diabetes Mother   . Bipolar disorder Father   . Hypertension Father   . Colon cancer Paternal Aunt   . Colon cancer Paternal Grandmother   . Thyroid  disease Neg Hx   . Breast cancer Neg Hx    History  Sexual Activity  . Sexual activity: Not on file    Outpatient Encounter Prescriptions as of 10/02/2016  Medication Sig  . acetaminophen (TYLENOL) 650 MG CR tablet Take 650 mg by mouth 3 (three) times daily as needed for pain.  Marland Kitchen BIOTIN PO Take 1 tablet by mouth daily.  Marland Kitchen CARTIA XT 180 MG 24 hr capsule TAKE 1 CAPSULE(180 MG) BY MOUTH DAILY  . clonazePAM (KLONOPIN) 0.5 MG tablet Take 1 tablet (0.5 mg total) by mouth daily as needed. (Patient taking differently: Take 0.5 mg by mouth daily as needed for anxiety. )  . diazepam (VALIUM) 5 MG tablet Take 2.5 mg by mouth at bedtime.   . divalproex (DEPAKOTE ER) 500 MG 24 hr tablet Take 1,000 mg by mouth daily.  Marland Kitchen gabapentin (NEURONTIN) 100 MG capsule LIMIT 1 TO 2 CAPSULES BY MOUTH TWICE TO THREE TIMES DAILY IF TOLERATED  . levothyroxine (SYNTHROID, LEVOTHROID) 100 MCG tablet TAKE 1 TABLET BY MOUTH EVERY DAY  . lisinopril (PRINIVIL,ZESTRIL) 5 MG tablet TAKE 1 TABLET BY MOUTH EVERY DAY  . metFORMIN (GLUCOPHAGE) 500 MG tablet TAKE  1 TABLET(500 MG) BY MOUTH TWICE DAILY WITH A MEAL  . MYRBETRIQ 50 MG TB24 tablet   . nystatin cream (MYCOSTATIN) Apply 1 application topically 2 (two) times daily as needed (for sore).  . pantoprazole (PROTONIX) 40 MG tablet TAKE 1 TABLET(40 MG) BY MOUTH DAILY  . rosuvastatin (CRESTOR) 20 MG tablet TAKE 1 TABLET(20 MG) BY MOUTH DAILY  . vitamin B-12 (CYANOCOBALAMIN) 500 MCG tablet Take 500 mcg by mouth at bedtime.    No facility-administered encounter medications on file as of 10/02/2016.     Activities of Daily Living In your present state of health, do you have any difficulty performing the following activities: 10/02/2016  Hearing? N  Vision? Y  Difficulty concentrating or making decisions? N  Walking or climbing stairs? Y  Dressing or bathing? N  Doing errands, shopping? Y  Preparing Food and eating ? N  Using the Toilet? N  In the past six months, have you  accidently leaked urine? Y  Do you have problems with loss of bowel control? N  Managing your Medications? Y  Managing your Finances? Y  Housekeeping or managing your Housekeeping? N  Some recent data might be hidden    Patient Care Team: Lucille Passy, MD as PCP - General Melvenia Beam, MD as Consulting Physician (Neurology) Mohammed Kindle, MD as Attending Physician (Pain Medicine) Philemon Kingdom, MD as Consulting Physician (Internal Medicine)    Assessment:     Hearing Screening   125Hz  250Hz  500Hz  1000Hz  2000Hz  3000Hz  4000Hz  6000Hz  8000Hz   Right ear:   40 40 40  40    Left ear:   40 40 40  40      Visual Acuity Screening   Right eye Left eye Both eyes  Without correction:     With correction: 20/25 20/50 20/20     Exercise Activities and Dietary recommendations Current Exercise Habits: The patient does not participate in regular exercise at present, Exercise limited by: None identified  Goals    . Increase water intake          Starting 10/02/2016, I will continue to drink at least 6-8 glasses of water daily.       Fall Risk Fall Risk  10/02/2016 06/27/2015 01/30/2015 01/03/2015 09/28/2014  Falls in the past year? No No (No Data) No Yes  Number falls in past yr: - - - - 2 or more  Injury with Fall? - - - - No  Risk Factor Category  - - - - High Fall Risk  Risk for fall due to : - - - - Impaired balance/gait;Impaired mobility;Medication side effect;Impaired vision  Follow up - - - - Falls evaluation completed   Depression Screen PHQ 2/9 Scores 10/02/2016 01/30/2015 01/03/2015 09/28/2014  PHQ - 2 Score 0 0 - 6  PHQ- 9 Score - - - 14  Exception Documentation - (No Data) Other- indicate reason in comment box Medical reason  Not completed - - pt states I have a psychiartist  -     Cognitive Function MMSE - Mini Mental State Exam 10/02/2016  Orientation to time 5  Orientation to Place 5  Registration 3  Attention/ Calculation 0  Recall 1  Recall-comments pt was  unable to recall 2 of 3 words  Language- name 2 objects 0  Language- repeat 1  Language- follow 3 step command 3  Language- read & follow direction 0  Write a sentence 0  Copy design 0  Total score 18   PLEASE NOTE:  A Mini-Cog screen was completed. Maximum score is 20. A value of 0 denotes this part of Folstein MMSE was not completed or the patient failed this part of the Mini-Cog screening.   Mini-Cog Screening Orientation to Time - Max 5 pts Orientation to Place - Max 5 pts Registration - Max 3 pts Recall - Max 3 pts Language Repeat - Max 1 pts Language Follow 3 Step Command - Max 3 pts    Montreal Cognitive Assessment  12/15/2014  Visuospatial/ Executive (0/5) 3  Naming (0/3) 3  Attention: Read list of digits (0/2) 2  Attention: Read list of letters (0/1) 1  Attention: Serial 7 subtraction starting at 100 (0/3) 3  Language: Repeat phrase (0/2) 2  Language : Fluency (0/1) 0  Abstraction (0/2) 0  Delayed Recall (0/5) 0  Orientation (0/6) 6  Total 20  Adjusted Score (based on education) 21      Immunization History  Administered Date(s) Administered  . Influenza Split 01/09/2011  . Influenza,inj,Quad PF,36+ Mos 04/13/2013, 01/15/2015, 12/13/2015  . Pneumococcal Polysaccharide-23 08/26/2013  . Zoster 08/26/2013   Screening Tests Health Maintenance  Topic Date Due  . FOOT EXAM  10/09/2016 (Originally 06/26/2016)  . OPHTHALMOLOGY EXAM  10/02/2017 (Originally 06/06/1965)  . TETANUS/TDAP  10/03/2026 (Originally 06/05/1972)  . INFLUENZA VACCINE  11/19/2016  . HEMOGLOBIN A1C  04/03/2017  . MAMMOGRAM  08/21/2017  . PNEUMOCOCCAL POLYSACCHARIDE VACCINE (2) 08/27/2018  . COLONOSCOPY  10/12/2018  . COLON CANCER SCREENING 5 YEAR SIGMOIDOSCOPY  11/18/2020  . Hepatitis C Screening  Completed  . HIV Screening  Completed      Plan:     I have personally reviewed and addressed the Medicare Annual Wellness questionnaire and have noted the following in the patient's chart:   A. Medical and social history B. Use of alcohol, tobacco or illicit drugs  C. Current medications and supplements D. Functional ability and status E.  Nutritional status F.  Physical activity G. Advance directives H. List of other physicians I.  Hospitalizations, surgeries, and ER visits in previous 12 months J.  [place] to include hearing, vision, cognitive, depression L. Referrals and appointments - none  In addition, I have reviewed and discussed with patient certain preventive protocols, quality metrics, and best practice recommendations. A written personalized care plan for preventive services as well as general preventive health recommendations were provided to patient.  See attached scanned questionnaire for additional information.   Signed,   Lindell Noe, MHA, BS, LPN Health Coach

## 2016-10-03 LAB — HIV ANTIBODY (ROUTINE TESTING W REFLEX): HIV: NONREACTIVE

## 2016-10-03 LAB — HEPATITIS C ANTIBODY: HCV Ab: NEGATIVE

## 2016-10-09 ENCOUNTER — Ambulatory Visit (INDEPENDENT_AMBULATORY_CARE_PROVIDER_SITE_OTHER): Payer: Medicare Other | Admitting: Family Medicine

## 2016-10-09 VITALS — BP 120/88 | HR 71 | Ht 65.0 in | Wt 149.5 lb

## 2016-10-09 DIAGNOSIS — E1169 Type 2 diabetes mellitus with other specified complication: Secondary | ICD-10-CM

## 2016-10-09 DIAGNOSIS — F3132 Bipolar disorder, current episode depressed, moderate: Secondary | ICD-10-CM

## 2016-10-09 DIAGNOSIS — E038 Other specified hypothyroidism: Secondary | ICD-10-CM

## 2016-10-09 DIAGNOSIS — Z01419 Encounter for gynecological examination (general) (routine) without abnormal findings: Secondary | ICD-10-CM

## 2016-10-09 DIAGNOSIS — G894 Chronic pain syndrome: Secondary | ICD-10-CM

## 2016-10-09 DIAGNOSIS — E1165 Type 2 diabetes mellitus with hyperglycemia: Secondary | ICD-10-CM

## 2016-10-09 DIAGNOSIS — I10 Essential (primary) hypertension: Secondary | ICD-10-CM | POA: Diagnosis not present

## 2016-10-09 DIAGNOSIS — IMO0002 Reserved for concepts with insufficient information to code with codable children: Secondary | ICD-10-CM

## 2016-10-09 DIAGNOSIS — E785 Hyperlipidemia, unspecified: Secondary | ICD-10-CM

## 2016-10-09 DIAGNOSIS — K3184 Gastroparesis: Secondary | ICD-10-CM

## 2016-10-09 DIAGNOSIS — E1143 Type 2 diabetes mellitus with diabetic autonomic (poly)neuropathy: Secondary | ICD-10-CM | POA: Diagnosis not present

## 2016-10-09 MED ORDER — GABAPENTIN 100 MG PO CAPS: ORAL_CAPSULE | ORAL | 3 refills | Status: DC

## 2016-10-09 MED ORDER — CANAGLIFLOZIN 100 MG PO TABS: 100.0000 mg | ORAL_TABLET | Freq: Every day | ORAL | 0 refills | Status: DC

## 2016-10-09 NOTE — Assessment & Plan Note (Signed)
At goal for diabetic. No changes made tor rx.

## 2016-10-09 NOTE — Assessment & Plan Note (Signed)
Reviewed preventive care protocols, scheduled due services, and updated immunizations Discussed nutrition, exercise, diet, and healthy lifestyle.  

## 2016-10-09 NOTE — Assessment & Plan Note (Signed)
Followed by psychiatry. No changes made to rxs. 

## 2016-10-09 NOTE — Assessment & Plan Note (Signed)
Still remains poorly controlled but improving. Has follow up scheduled with endo later this summer. No changes made to rxs today.

## 2016-10-09 NOTE — Progress Notes (Signed)
Pre visit review using our clinic review tool, if applicable. No additional management support is needed unless otherwise documented below in the visit note. 

## 2016-10-09 NOTE — Progress Notes (Signed)
Subjective:   Patient ID: Lori Phillips, female    DOB: [birth year], 61 y.o.   MRN: [id number]  Lori Phillips is a pleasant 61 y.o. year old female who presents to clinic today with her daughter for Annual Exam  and follow up of chronic medical conditions on 10/09/2016  HPI:  Annual medicare wellness visit with Candis Musa, RN on 10/02/16. Note reviewed.  Zoster 08/26/13 Pneumovax 08/26/13 Influenza vaccine 01/15/15  Remote h/o hysterectomy Colonoscopy 10/12/15 Mammogram 08/22/15  Bipolar disorder- has been controlled Now on depakote. No recent mania and not depressed Dr Karalee Height is the psychiatrist.  Hypothyroidism- followed by Dr. Graceann Congress.  Synthroid was lowered in May 2018. Lab Results  Component Value Date   TSH 0.02 (L) 10/02/2016   DM- also followed by Dr. Graceann Congress. She is compliant with Invokana and Metformin. She has noticed FSBS are improving- checks daily.  Fasting this morning was 138.  Lab Results  Component Value Date   HGBA1C 8.6 (H) 10/02/2016   Wt Readings from Last 3 Encounters:  10/09/16 149 lb 8 oz (67.8 kg)  10/02/16 151 lb 12 oz (68.8 kg)  06/10/16 160 lb 12.8 oz (72.9 kg)    Parkinsonism- followed by neurology, Dr. Jaynee Eagles.   She has been compliant with sineme, dose increased yesterday.  This has helped with balance and tremors.  Also being worked up for NPH as well.  Lab Results  Component Value Date   NA 139 10/02/2016   K 4.4 10/02/2016   CL 104 10/02/2016   CO2 24 10/02/2016   Lab Results  Component Value Date   ALT 13 10/02/2016   AST 13 10/02/2016   ALKPHOS 65 10/02/2016   BILITOT 0.3 10/02/2016   Lab Results  Component Value Date   WBC 11.1 (H) 06/01/2016   HGB 13.5 06/01/2016   HCT 40.8 06/01/2016   MCV 93.6 06/01/2016   PLT 234 06/01/2016   Lab Results  Component Value Date   CHOL 116 10/02/2016   HDL 36.20 (L) 10/02/2016   LDLCALC 80 07/05/2013   LDLDIRECT 45.0 10/02/2016   TRIG 271.0 (H) 10/02/2016   CHOLHDL 3  10/02/2016    Current Outpatient Prescriptions on File Prior to Visit  Medication Sig Dispense Refill  . acetaminophen (TYLENOL) 650 MG CR tablet Take 650 mg by mouth 3 (three) times daily as needed for pain.    Marland Kitchen BIOTIN PO Take 1 tablet by mouth daily.    Marland Kitchen CARTIA XT 180 MG 24 hr capsule TAKE 1 CAPSULE(180 MG) BY MOUTH DAILY 90 capsule 0  . clonazePAM (KLONOPIN) 0.5 MG tablet Take 1 tablet (0.5 mg total) by mouth daily as needed. (Patient taking differently: Take 0.5 mg by mouth daily as needed for anxiety. ) 30 tablet 0  . diazepam (VALIUM) 5 MG tablet Take 2.5 mg by mouth at bedtime.     . divalproex (DEPAKOTE ER) 500 MG 24 hr tablet Take 1,000 mg by mouth daily.    Marland Kitchen gabapentin (NEURONTIN) 100 MG capsule LIMIT 1 TO 2 CAPSULES BY MOUTH TWICE TO THREE TIMES DAILY IF TOLERATED 180 capsule 0  . levothyroxine (SYNTHROID, LEVOTHROID) 100 MCG tablet TAKE 1 TABLET BY MOUTH EVERY DAY 30 tablet 0  . lisinopril (PRINIVIL,ZESTRIL) 5 MG tablet TAKE 1 TABLET BY MOUTH EVERY DAY 90 tablet 1  . metFORMIN (GLUCOPHAGE) 500 MG tablet TAKE 1 TABLET(500 MG) BY MOUTH TWICE DAILY WITH A MEAL 60 tablet 0  . nystatin cream (MYCOSTATIN) Apply 1 application  topically 2 (two) times daily as needed (for sore).    . pantoprazole (PROTONIX) 40 MG tablet TAKE 1 TABLET(40 MG) BY MOUTH DAILY 90 tablet 1  . rosuvastatin (CRESTOR) 20 MG tablet TAKE 1 TABLET(20 MG) BY MOUTH DAILY 90 tablet 0  . vitamin B-12 (CYANOCOBALAMIN) 500 MCG tablet Take 500 mcg by mouth at bedtime.     . [DISCONTINUED] TAZTIA XT 180 MG 24 hr capsule TAKE ONE CAPSULE BY MOUTH EVERY DAY 90 capsule 0   No current facility-administered medications on file prior to visit.     Allergies  Allergen Reactions  . Glyburide Other (See Comments)    Reaction:  Dizziness and confusion   . Seroquel [Quetiapine Fumarate] Diarrhea, Nausea And Vomiting and Other (See Comments)    Reaction:  Confusion   . Byetta 10 Mcg Pen [Exenatide] Nausea And Vomiting    Other  reaction(s): Nausea And Vomiting    Past Medical History:  Diagnosis Date  . Allergy   . Anxiety   . Bipolar disorder ([place])   . CAD (coronary artery disease)    multiple caths  . Diabetes mellitus   . Enlarged liver   . Enlarged pituitary gland ([place])   . Fibromyalgia   . Fibromyalgia   . Hyperlipidemia   . Hypertension   . Hypothyroidism   . Kidney stones   . Low back pain   . Nephrolithiasis   . Osteoarthritis   . Osteoporosis   . Panic attacks   . Parkinsonism ([place]) 2016  . Rheumatoid arthritis ([place])   . Scoliosis   . Thyroid disease    hypothyroid    Past Surgical History:  Procedure Laterality Date  . BLADDER SURGERY     ?bladder sling   . CARDIAC CATHETERIZATION     x3  . COLONOSCOPY  multiple   2005 [place] records not available-polyps x 3  . COLONOSCOPY WITH PROPOFOL N/A 10/12/2015   Procedure: COLONOSCOPY WITH PROPOFOL;  Surgeon: Lollie Sails, MD;  Location: [facility] ENDOSCOPY;  Service: Endoscopy;  Laterality: N/A;  . ESOPHAGEAL DILATION    . kidney stone removal    . TONSILLECTOMY    . VAGINAL HYSTERECTOMY      Family History  Problem Relation Age of Onset  . Hypertension Mother   . Diabetes Mother   . Bipolar disorder Father   . Hypertension Father   . Colon cancer Paternal Aunt   . Colon cancer Paternal Grandmother   . Thyroid disease Neg Hx   . Breast cancer Neg Hx     Social History   Social History  . Marital status: Single    Spouse name: N/A  . Number of children: 3  . Years of education: 12   Occupational History  . disability  Other   Social History Main Topics  . Smoking status: Never Smoker  . Smokeless tobacco: Never Used  . Alcohol use No  . Drug use: No  . Sexual activity: Not on file   Other Topics Concern  . Not on file   Social History Narrative   Has a living will- desires CPR but no prolonged life support if futile.   Caffeine use: Drinks 2 L soda per day   Drinks 2 glasses tea/day   Right-handed    The PMH, PSH, Social History, Family History, Medications, and allergies have been reviewed in [facility], and have been updated if relevant.    Review of Systems  HENT: Negative.   Eyes: Negative.  Respiratory: Negative.   Cardiovascular: Negative.   Gastrointestinal: Negative.   Endocrine: Negative.   Genitourinary: Negative.   Musculoskeletal: Negative.   Skin: Negative.   Allergic/Immunologic: Negative.   Neurological: Negative for tremors, syncope, speech difficulty and weakness.       Objective:    BP 120/88   Pulse 71   Ht 5\' 5"  (1.651 m)   Wt 149 lb 8 oz (67.8 kg)   SpO2 100%   BMI 24.88 kg/m    Physical Exam   General:  Well-developed,well-nourished,in no acute distress; alert,appropriate and cooperative throughout examination Head:  normocephalic and atraumatic.   Eyes:  vision grossly intact, PERRL Ears:  R ear normal and L ear normal externally, TMs clear bilaterally Nose:  no external deformity.   Mouth:  good dentition.   Neck:  No deformities, masses, or tenderness noted. Breasts:  No mass, nodules, thickening, tenderness, bulging, retraction, inflamation, nipple discharge or skin changes noted.   Lungs:  Normal respiratory effort, chest expands symmetrically. Lungs are clear to auscultation, no crackles or wheezes. Heart:  Normal rate and regular rhythm. S1 and S2 normal without gallop, murmur, click, rub or other extra sounds. Abdomen:  Bowel sounds positive,abdomen soft and non-tender without masses, organomegaly or hernias noted. Msk:  No deformity or scoliosis noted of thoracic or lumbar spine.   Extremities:  No clubbing, cyanosis, edema, or deformity noted with normal full range of motion of all joints.   Neurologic:  alert & oriented X3 and gait normal.   Skin:  Intact without suspicious lesions or rashes Cervical Nodes:  No lymphadenopathy noted Axillary Nodes:  No palpable lymphadenopathy Psych:  Cognition and judgment appear intact. Alert and  cooperative with normal attention span and concentration. No apparent delusions, illusions, hallucinations      Assessment & Plan:   Well woman exam  Other specified hypothyroidism  Hyperlipidemia associated with type 2 diabetes mellitus ([place])  Gastroparesis due to DM [facility])  Essential hypertension  Chronic pain syndrome  Bipolar affective disorder, currently depressed, moderate ([place]) No Follow-up on file.

## 2016-10-09 NOTE — Addendum Note (Signed)
Addended by: Lucille Passy on: 10/09/2016 09:38 AM   Modules accepted: Orders

## 2016-10-09 NOTE — Patient Instructions (Signed)
Great to see you.  Please return in 2 months to see me and for labs.

## 2016-10-09 NOTE — Assessment & Plan Note (Signed)
Was over corrected and now appears to be fluctuating. Will continue current dose of synthroid. Managed by endo but she will come see me in a couple of months to repeat labs.

## 2016-10-24 IMAGING — MG MM DIGITAL SCREENING BILAT W/ TOMO W/ CAD
8 of 12 series · 8 of 28 positions shown · non-contrast
Comparison: Previous exam(s).

CLINICAL DATA: Screening.

EXAM:
2D DIGITAL SCREENING BILATERAL MAMMOGRAM WITH CAD AND ADJUNCT TOMO

[L MLO synth-2D]
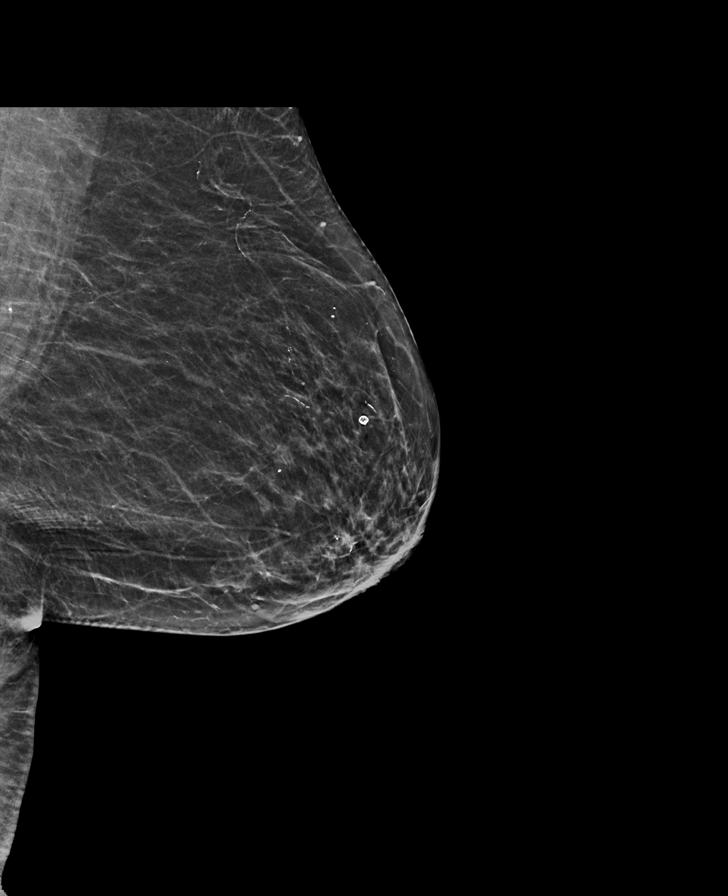

[R MLO]
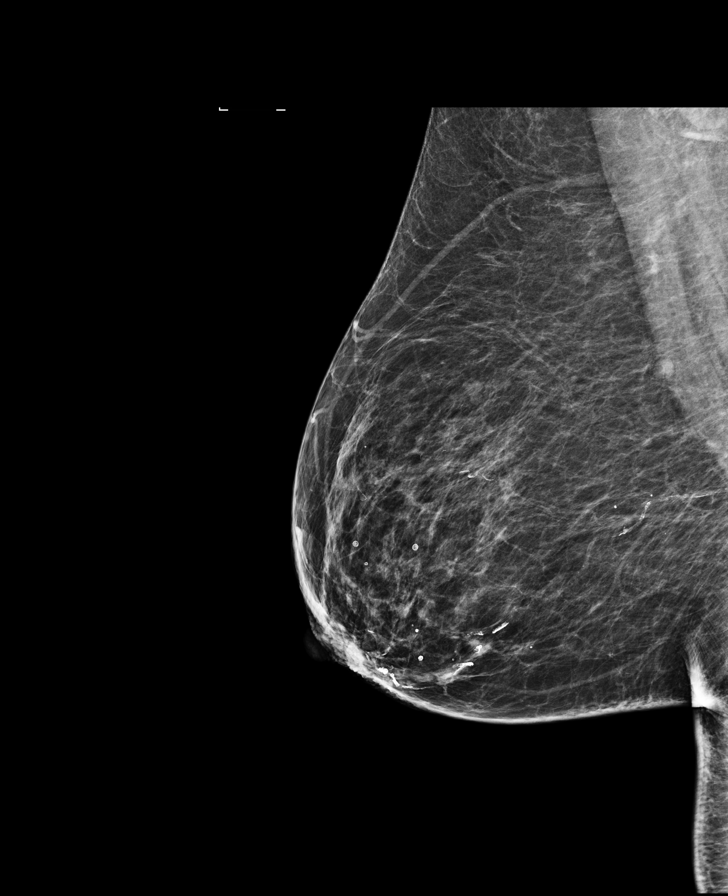

[R MLO synth-2D]
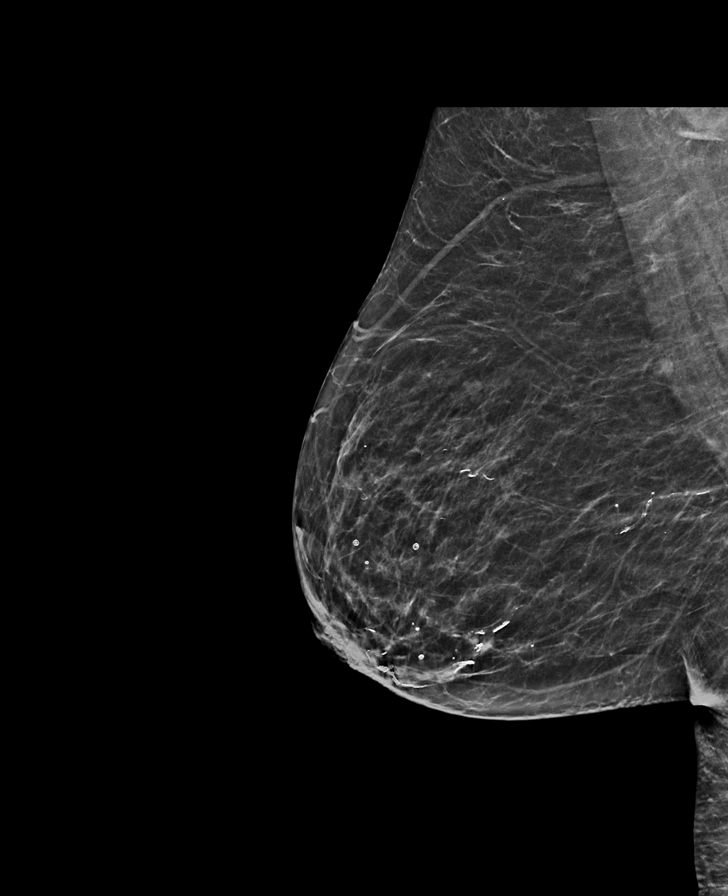

[L CC synth-2D]
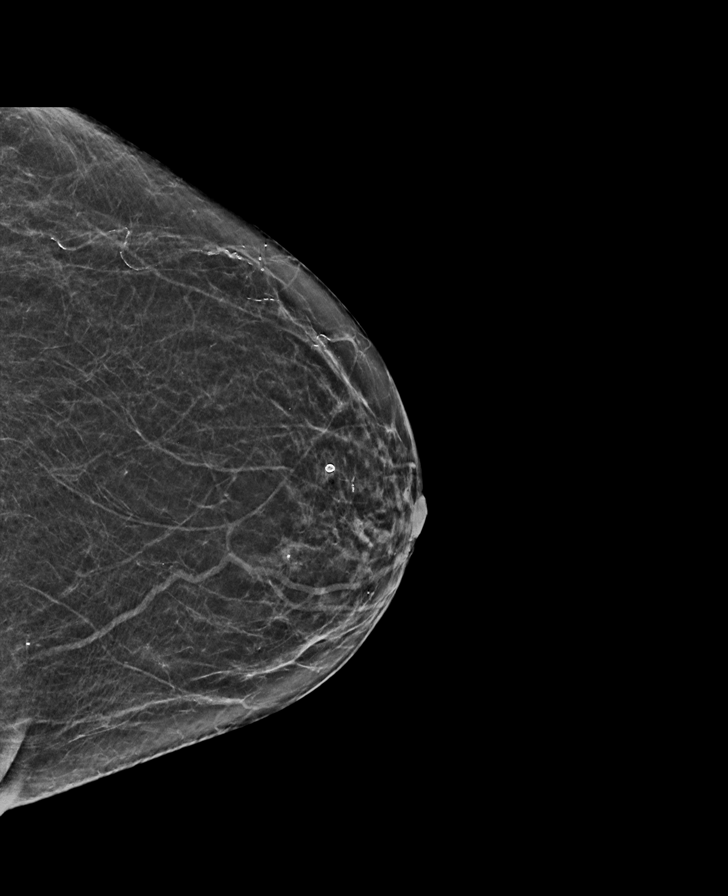

[R CC synth-2D]
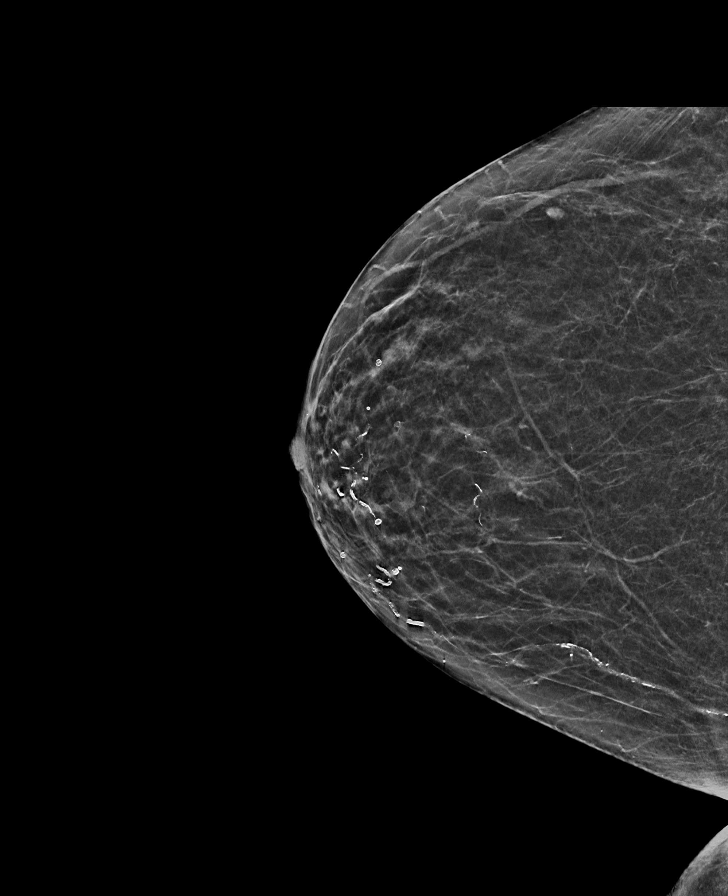

[R CC]
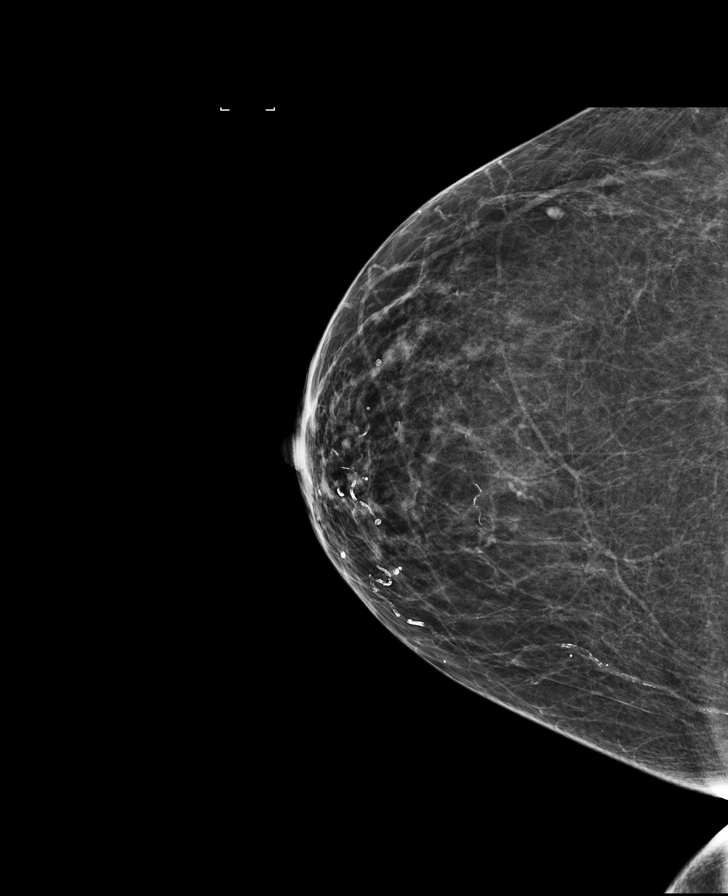

[L CC]
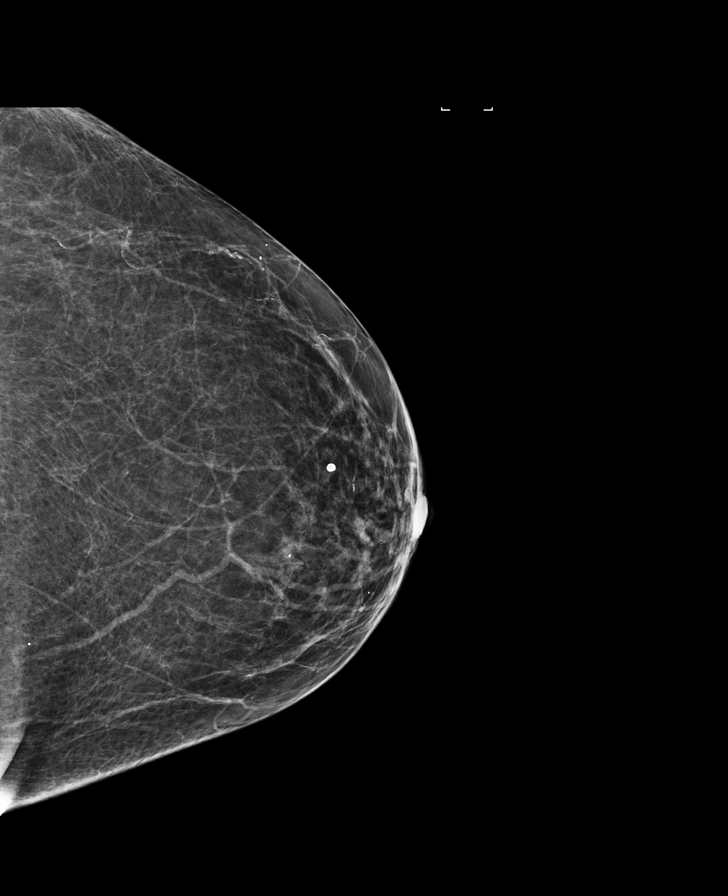

[L MLO]
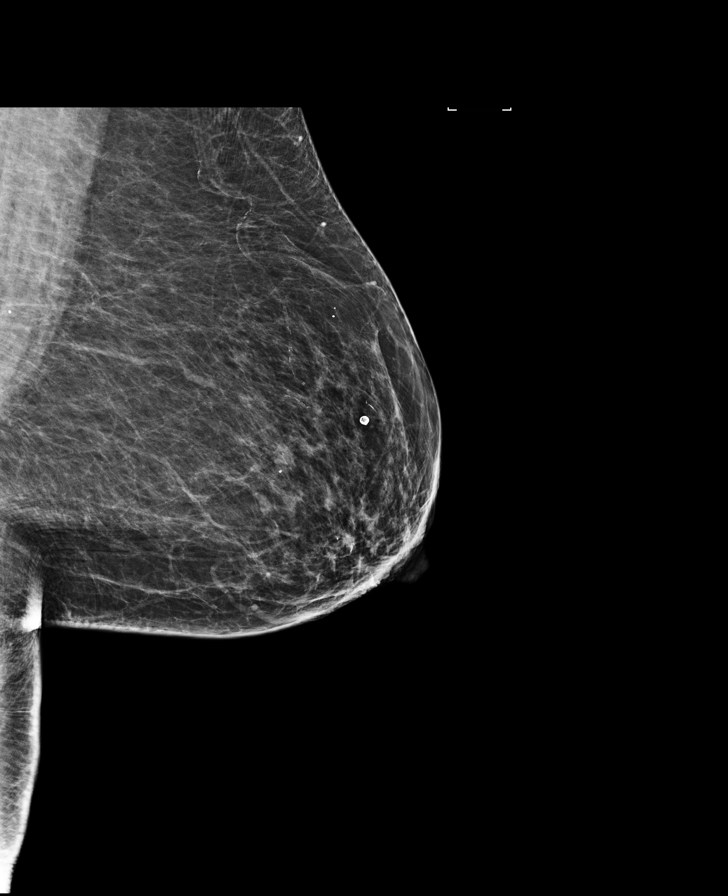

[8 of 28 positions shown; findings below may reference images not displayed]

ACR Breast Density Category b: There are scattered areas of
fibroglandular density.
FINDINGS: There are no findings suspicious for malignancy. Images were
processed with CAD.
IMPRESSION: No mammographic evidence of malignancy. A result letter of this
screening mammogram will be mailed directly to the patient.

RECOMMENDATION:
Screening mammogram in one year. (Code:97-6-RS4)

BI-RADS CATEGORY  1: Negative.

## 2016-11-27 ENCOUNTER — Other Ambulatory Visit: Payer: Self-pay | Admitting: Family Medicine

## 2016-11-27 DIAGNOSIS — E038 Other specified hypothyroidism: Secondary | ICD-10-CM

## 2016-12-02 ENCOUNTER — Other Ambulatory Visit (INDEPENDENT_AMBULATORY_CARE_PROVIDER_SITE_OTHER): Payer: Medicare Other

## 2016-12-02 DIAGNOSIS — E038 Other specified hypothyroidism: Secondary | ICD-10-CM | POA: Diagnosis not present

## 2016-12-02 LAB — T4, FREE: Free T4: 2.11 ng/dL — ABNORMAL HIGH (ref 0.60–1.60)

## 2016-12-02 LAB — TSH

## 2016-12-02 LAB — T3, FREE: T3 FREE: 3.9 pg/mL (ref 2.3–4.2)

## 2016-12-03 ENCOUNTER — Telehealth: Payer: Self-pay | Admitting: Family Medicine

## 2016-12-03 NOTE — Telephone Encounter (Signed)
Pt was notified of results and instructions, pt verbalized understanding.   

## 2016-12-03 NOTE — Telephone Encounter (Signed)
Caller Name:Valla Marts   Relationship to Patient:self Best number:[phone] Pharmacy:  Reason for call:  Request cb about labs

## 2016-12-03 NOTE — Telephone Encounter (Signed)
Called to inform patient that Labs have been resent to Dr Honor Junes @ [facility]

## 2016-12-03 NOTE — Telephone Encounter (Signed)
Patient said Direce asked her to call back with the fax number to her endocrinologist, so results can be faxed.  Patient said her doctor was Dr.Abby, but she left and the new doctor's name is Dr.O'Connell and the fax number is [phone].

## 2016-12-04 ENCOUNTER — Ambulatory Visit: Payer: Medicare Other | Admitting: Family Medicine

## 2016-12-05 ENCOUNTER — Encounter: Payer: Self-pay | Admitting: Internal Medicine

## 2016-12-08 ENCOUNTER — Ambulatory Visit: Payer: Medicare Other | Admitting: Family Medicine

## 2016-12-08 ENCOUNTER — Telehealth: Payer: Self-pay | Admitting: Family Medicine

## 2016-12-08 NOTE — Telephone Encounter (Signed)
Patient called and left message on line. She states that she has recently had bloodwork drawn and wants to kniw if PCP can discuss over the phone instead of having her come in for an appointment. She states things are hard right now and over the phone would be easiest.  Callback# [phone]

## 2016-12-16 ENCOUNTER — Telehealth: Payer: Self-pay | Admitting: Internal Medicine

## 2016-12-16 NOTE — Telephone Encounter (Signed)
Patient dismissed from [facility] by Philemon Kingdom , effective December 05, 2016. Dismissal letter sent out by certified / registered mail.  daj

## 2016-12-18 ENCOUNTER — Telehealth: Payer: Self-pay | Admitting: Family Medicine

## 2016-12-18 NOTE — Telephone Encounter (Signed)
Caller Name:Carneshia Hilyard  Relationship to Patient:self Best number: [phone] Pharmacy:  Reason for call:  Pt would like call regarding last labs.  She has not received any info from endo.  Requesting cb. Also, cancelled follow up appt due to not having ride to the office

## 2016-12-19 NOTE — Telephone Encounter (Signed)
Left detailed message, also pt can contact endo to get an update I faxed those results to them again.

## 2016-12-21 ENCOUNTER — Other Ambulatory Visit: Payer: Self-pay | Admitting: Family Medicine

## 2016-12-23 ENCOUNTER — Other Ambulatory Visit: Payer: Self-pay | Admitting: Family Medicine

## 2016-12-24 ENCOUNTER — Ambulatory Visit: Payer: Medicare Other | Admitting: Family Medicine

## 2016-12-24 NOTE — Telephone Encounter (Signed)
Received signed domestic return receipt verifying delivery of certified letter on December 18, 2016. Article number [id number]

## 2016-12-30 DIAGNOSIS — E039 Hypothyroidism, unspecified: Secondary | ICD-10-CM | POA: Diagnosis not present

## 2016-12-30 DIAGNOSIS — E1165 Type 2 diabetes mellitus with hyperglycemia: Secondary | ICD-10-CM | POA: Diagnosis not present

## 2017-01-13 ENCOUNTER — Other Ambulatory Visit: Payer: Self-pay | Admitting: Family Medicine

## 2017-01-13 ENCOUNTER — Other Ambulatory Visit: Payer: Self-pay

## 2017-01-13 MED ORDER — DIVALPROEX SODIUM ER 500 MG PO TB24: 1000.0000 mg | ORAL_TABLET | Freq: Every day | ORAL | 0 refills | Status: DC

## 2017-01-13 NOTE — Telephone Encounter (Signed)
Pt left v/m pts psychiatrist is not on pts ins any longer; pt is having problems getting appt with another psychiatrist that takes pts ins. Previous psychiatrist will not continue to fill depakote and pt wants to know if Dr Deborra Medina can prescribed depakote until pt can get established with a psychiatrist that is approved by pts ins co. Pt request cb.

## 2017-01-17 ENCOUNTER — Other Ambulatory Visit: Payer: Self-pay | Admitting: Family Medicine

## 2017-01-21 ENCOUNTER — Telehealth: Payer: Self-pay | Admitting: *Deleted

## 2017-01-21 DIAGNOSIS — IMO0002 Reserved for concepts with insufficient information to code with codable children: Secondary | ICD-10-CM

## 2017-01-21 DIAGNOSIS — E1165 Type 2 diabetes mellitus with hyperglycemia: Secondary | ICD-10-CM

## 2017-01-21 DIAGNOSIS — E038 Other specified hypothyroidism: Secondary | ICD-10-CM

## 2017-01-21 DIAGNOSIS — E1143 Type 2 diabetes mellitus with diabetic autonomic (poly)neuropathy: Secondary | ICD-10-CM

## 2017-01-21 NOTE — Telephone Encounter (Signed)
PATIENT REQUESTS A 90 DAY SUPPLY  Last refill  01/13/17  #60 Last OV 10/09/16 Ok to refill?

## 2017-01-21 NOTE — Telephone Encounter (Signed)
Lm on pts vm requesting a call back to schedule lab only appt for microalbumin

## 2017-01-27 ENCOUNTER — Telehealth: Payer: Self-pay | Admitting: Family Medicine

## 2017-01-27 ENCOUNTER — Other Ambulatory Visit: Payer: Self-pay | Admitting: Family Medicine

## 2017-01-27 DIAGNOSIS — E039 Hypothyroidism, unspecified: Secondary | ICD-10-CM

## 2017-01-27 NOTE — Telephone Encounter (Signed)
Yes okay to TSH, FT4.

## 2017-01-27 NOTE — Telephone Encounter (Signed)
Pt called about needing a refill for all medications per pt. She does not know which ones. Thank you!  Pharmacy is [facility]  Call pt @ [phone].

## 2017-01-27 NOTE — Telephone Encounter (Signed)
Ordered TSH and FT4. Can you order rest of labs that she needs. Thank you.

## 2017-01-27 NOTE — Telephone Encounter (Signed)
Patient called to schedule lab appointment on 01/29/17.  Patient wants to know if her thyroid can be checked at the same time, since the dosage was decreased.

## 2017-01-28 MED ORDER — DIVALPROEX SODIUM ER 500 MG PO TB24: ORAL_TABLET | ORAL | 0 refills | Status: DC

## 2017-01-28 MED ORDER — ROSUVASTATIN CALCIUM 20 MG PO TABS: 20.0000 mg | ORAL_TABLET | Freq: Every day | ORAL | 0 refills | Status: DC

## 2017-01-28 MED ORDER — PANTOPRAZOLE SODIUM 40 MG PO TBEC: 40.0000 mg | DELAYED_RELEASE_TABLET | Freq: Every day | ORAL | 0 refills | Status: DC

## 2017-01-28 MED ORDER — GABAPENTIN 100 MG PO CAPS: ORAL_CAPSULE | ORAL | 0 refills | Status: DC

## 2017-01-28 MED ORDER — LEVOTHYROXINE SODIUM 100 MCG PO TABS: 100.0000 ug | ORAL_TABLET | Freq: Every day | ORAL | 0 refills | Status: DC

## 2017-01-28 MED ORDER — METFORMIN HCL 500 MG PO TABS: ORAL_TABLET | ORAL | 0 refills | Status: DC

## 2017-01-28 MED ORDER — LISINOPRIL 5 MG PO TABS: 5.0000 mg | ORAL_TABLET | Freq: Every day | ORAL | 0 refills | Status: DC

## 2017-01-28 MED ORDER — DILTIAZEM HCL ER COATED BEADS 180 MG PO CP24: 180.0000 mg | ORAL_CAPSULE | Freq: Every day | ORAL | 0 refills | Status: DC

## 2017-01-28 NOTE — Telephone Encounter (Signed)
Pt got refill depakote # 60 on 01/21/17 but Manuela Schwartz pharmacist at [facility] did not get depakote # 180 on 01/21/17. Medication phoned to Manuela Schwartz pharmacist at [facility] as instructed. Manuela Schwartz will put on hold until time to fill and pt calls for refill. Pt voiced understanding and very appreciative.

## 2017-01-28 NOTE — Addendum Note (Signed)
Addended by: Helene Shoe on: 01/28/2017 11:33 AM   Modules accepted: Orders

## 2017-01-28 NOTE — Telephone Encounter (Signed)
Discussed with pt that she must be seen by someone before this refill runs out/she agrees/thx dmf

## 2017-01-28 NOTE — Telephone Encounter (Signed)
She needs to be seen before the spring but okay to refill rxs one time only as I do not want them to run out.

## 2017-01-28 NOTE — Telephone Encounter (Signed)
TA-Patient called requesting refills/she is not able to get here until Thanksgiving/I advised her that she was to be seen in August and has cancelled several times/she said that she has a new pt appt in April for when her daughter is on her spring break since you will be leaving this office/plz advise about refills? Thx dmf

## 2017-01-29 ENCOUNTER — Other Ambulatory Visit (INDEPENDENT_AMBULATORY_CARE_PROVIDER_SITE_OTHER): Payer: Medicare Other

## 2017-01-29 DIAGNOSIS — E039 Hypothyroidism, unspecified: Secondary | ICD-10-CM | POA: Diagnosis not present

## 2017-01-29 DIAGNOSIS — E1143 Type 2 diabetes mellitus with diabetic autonomic (poly)neuropathy: Secondary | ICD-10-CM

## 2017-01-29 DIAGNOSIS — E1165 Type 2 diabetes mellitus with hyperglycemia: Secondary | ICD-10-CM | POA: Diagnosis not present

## 2017-01-29 DIAGNOSIS — E038 Other specified hypothyroidism: Secondary | ICD-10-CM

## 2017-01-29 DIAGNOSIS — IMO0002 Reserved for concepts with insufficient information to code with codable children: Secondary | ICD-10-CM

## 2017-01-29 LAB — LIPID PANEL
Cholesterol: 147 mg/dL (ref 0–200)
HDL: 45 mg/dL (ref >39.00)
NONHDL: 101.79
Total CHOL/HDL Ratio: 3
Triglycerides: 373 mg/dL — ABNORMAL HIGH (ref 0.0–149.0)
VLDL: 74.6 mg/dL — ABNORMAL HIGH (ref 0.0–40.0)

## 2017-01-29 LAB — COMPREHENSIVE METABOLIC PANEL
ALK PHOS: 58 U/L (ref 39–117)
ALT: 9 U/L (ref 0–35)
AST: 11 U/L (ref 0–37)
Albumin: 4.5 g/dL (ref 3.5–5.2)
BILIRUBIN TOTAL: 0.4 mg/dL (ref 0.2–1.2)
BUN: 20 mg/dL (ref 6–23)
CO2: 28 mEq/L (ref 19–32)
CREATININE: 1.11 mg/dL (ref 0.40–1.20)
Calcium: 10.2 mg/dL (ref 8.4–10.5)
Chloride: 104 mEq/L (ref 96–112)
GFR: 52.66 mL/min — ABNORMAL LOW (ref >60.00)
GLUCOSE: 119 mg/dL — AB (ref 70–99)
Potassium: 5.5 mEq/L — ABNORMAL HIGH (ref 3.5–5.1)
SODIUM: 140 meq/L (ref 135–145)
TOTAL PROTEIN: 7.2 g/dL (ref 6.0–8.3)

## 2017-01-29 LAB — TSH: TSH: 9.1 u[IU]/mL — ABNORMAL HIGH (ref 0.35–4.50)

## 2017-01-29 LAB — HEMOGLOBIN A1C: HEMOGLOBIN A1C: 7.4 % — AB (ref 4.6–6.5)

## 2017-01-29 LAB — LDL CHOLESTEROL, DIRECT: LDL DIRECT: 56 mg/dL

## 2017-01-29 LAB — T4, FREE: FREE T4: 0.97 ng/dL (ref 0.60–1.60)

## 2017-03-15 ENCOUNTER — Other Ambulatory Visit: Payer: Self-pay | Admitting: Family Medicine

## 2017-03-16 NOTE — Telephone Encounter (Signed)
On 10.10.18 #90 was faxed/thx dmf

## 2017-04-29 DIAGNOSIS — E039 Hypothyroidism, unspecified: Secondary | ICD-10-CM | POA: Diagnosis not present

## 2017-05-15 ENCOUNTER — Encounter: Payer: Self-pay | Admitting: Primary Care

## 2017-05-15 ENCOUNTER — Ambulatory Visit (INDEPENDENT_AMBULATORY_CARE_PROVIDER_SITE_OTHER): Payer: PPO | Admitting: Primary Care

## 2017-05-15 VITALS — BP 140/100 | HR 51 | Temp 97.7°F | Ht 65.0 in | Wt 153.1 lb

## 2017-05-15 DIAGNOSIS — E785 Hyperlipidemia, unspecified: Secondary | ICD-10-CM | POA: Diagnosis not present

## 2017-05-15 DIAGNOSIS — E1143 Type 2 diabetes mellitus with diabetic autonomic (poly)neuropathy: Secondary | ICD-10-CM | POA: Diagnosis not present

## 2017-05-15 DIAGNOSIS — E1165 Type 2 diabetes mellitus with hyperglycemia: Secondary | ICD-10-CM | POA: Diagnosis not present

## 2017-05-15 DIAGNOSIS — I10 Essential (primary) hypertension: Secondary | ICD-10-CM

## 2017-05-15 DIAGNOSIS — IMO0002 Reserved for concepts with insufficient information to code with codable children: Secondary | ICD-10-CM

## 2017-05-15 DIAGNOSIS — E1169 Type 2 diabetes mellitus with other specified complication: Secondary | ICD-10-CM

## 2017-05-15 LAB — BASIC METABOLIC PANEL
BUN: 23 mg/dL (ref 6–23)
CALCIUM: 9.5 mg/dL (ref 8.4–10.5)
CO2: 27 meq/L (ref 19–32)
CREATININE: 1.06 mg/dL (ref 0.40–1.20)
Chloride: 101 mEq/L (ref 96–112)
GFR: 55.48 mL/min — ABNORMAL LOW (ref >60.00)
Glucose, Bld: 153 mg/dL — ABNORMAL HIGH (ref 70–99)
Potassium: 4.9 mEq/L (ref 3.5–5.1)
Sodium: 137 mEq/L (ref 135–145)

## 2017-05-15 MED ORDER — ROSUVASTATIN CALCIUM 20 MG PO TABS: 20.0000 mg | ORAL_TABLET | Freq: Every day | ORAL | 1 refills | Status: DC

## 2017-05-15 MED ORDER — METFORMIN HCL 500 MG PO TABS: ORAL_TABLET | ORAL | 1 refills | Status: DC

## 2017-05-15 MED ORDER — LISINOPRIL 5 MG PO TABS: 5.0000 mg | ORAL_TABLET | Freq: Every day | ORAL | 1 refills | Status: DC

## 2017-05-15 NOTE — Progress Notes (Signed)
Subjective:    Patient ID: Lori Phillips, female    DOB: 04-Sep-1955, 62 y.o.   MRN: [id number]  HPI  Lori Phillips is a 62 year old female, patient of Dr. Hulen Shouts to transfer to Dr. Silvio Pate in April, needing medication refill for hypertension.  1) Essential Hypertension: Currently managed on diltiazem CD 180 mg capsules, lisinopril 5 mg. She ran out of her lisinopril about one week ago and is needing refills. She denies chest pain, dizziness, shortness of breath.   BP Readings from Last 3 Encounters:  05/15/17 (!) 140/100  10/09/16 120/88  10/02/16 118/82   2) Type 2 Diabetes: Currently managed on metformin 500 mg twice daily and Truliciy 3.30 mg and injects one weekly. She is following with endocrinology. She would like a refill of her metformin. Her endocrinologist manages her levothyroxine and her Trulicity. She's not taken the Invokana as she cannot afford.   3) Hyperlipidemia: Currently managed on rosuvastatin 20 mg. She is needing refills. Lipid panel in October 2018 with TC of 147, LDL of 56. She denies myalgias.  Review of Systems  Constitutional: Negative for fatigue.  Respiratory: Negative for shortness of breath.   Cardiovascular: Negative for chest pain.  Musculoskeletal: Negative for myalgias.  Neurological: Negative for dizziness and headaches.       Past Medical History:  Diagnosis Date  . Allergy   . Anxiety   . Bipolar disorder ([place])   . CAD (coronary artery disease)    multiple caths  . Diabetes mellitus   . Enlarged liver   . Enlarged pituitary gland ([place])   . Fibromyalgia   . Fibromyalgia   . Hyperlipidemia   . Hypertension   . Hypothyroidism   . Kidney stones   . Low back pain   . Nephrolithiasis   . Osteoarthritis   . Osteoporosis   . Panic attacks   . Parkinsonism ([place]) 2016  . Rheumatoid arthritis ([place])   . Scoliosis   . Thyroid disease    hypothyroid     Social History   Socioeconomic History  . Marital status: Single    Spouse name:  Not on file  . Number of children: 3  . Years of education: 34  . Highest education level: Not on file  Social Needs  . Financial resource strain: Not on file  . Food insecurity - worry: Not on file  . Food insecurity - inability: Not on file  . Transportation needs - medical: Not on file  . Transportation needs - non-medical: Not on file  Occupational History  . Occupation: disability     Employer: OTHER  Tobacco Use  . Smoking status: Never Smoker  . Smokeless tobacco: Never Used  Substance and Sexual Activity  . Alcohol use: No  . Drug use: No  . Sexual activity: Not on file  Other Topics Concern  . Not on file  Social History Narrative   Has a living will- desires CPR but no prolonged life support if futile.   Caffeine use: Drinks 2 L soda per day   Drinks 2 glasses tea/day   Right-handed    Past Surgical History:  Procedure Laterality Date  . BLADDER SURGERY     ?bladder sling   . CARDIAC CATHETERIZATION     x3  . COLONOSCOPY  multiple   2005 [place] records not available-polyps x 3  . COLONOSCOPY WITH PROPOFOL N/A 10/12/2015   Procedure: COLONOSCOPY WITH PROPOFOL;  Surgeon: Lollie Sails, MD;  Location: [facility]  ENDOSCOPY;  Service: Endoscopy;  Laterality: N/A;  . ESOPHAGEAL DILATION    . kidney stone removal    . TONSILLECTOMY    . VAGINAL HYSTERECTOMY      Family History  Problem Relation Age of Onset  . Hypertension Mother   . Diabetes Mother   . Bipolar disorder Father   . Hypertension Father   . Colon cancer Paternal Aunt   . Colon cancer Paternal Grandmother   . Thyroid disease Neg Hx   . Breast cancer Neg Hx     Allergies  Allergen Reactions  . Glyburide Other (See Comments)    Reaction:  Dizziness and confusion   . Seroquel [Quetiapine Fumarate] Diarrhea, Nausea And Vomiting and Other (See Comments)    Reaction:  Confusion   . Byetta 10 Mcg Pen [Exenatide] Nausea And Vomiting    Other reaction(s): Nausea And Vomiting    Current  Outpatient Medications on File Prior to Visit  Medication Sig Dispense Refill  . acetaminophen (TYLENOL) 650 MG CR tablet Take 650 mg by mouth 3 (three) times daily as needed for pain.    Marland Kitchen BIOTIN PO Take 1 tablet by mouth daily.    Marland Kitchen diltiazem (CARDIZEM CD) 180 MG 24 hr capsule TAKE 1 CAPSULE BY MOUTH DAILY    . divalproex (DEPAKOTE ER) 500 MG 24 hr tablet TAKE 2 TABLETS(1000 MG) BY MOUTH DAILY 180 tablet 0  . gabapentin (NEURONTIN) 100 MG capsule LIMIT 1 TO 2 CAPSULES BY MOUTH TWICE TO THREE TIMES DAILY IF TOLERATED 180 capsule 0  . levothyroxine (SYNTHROID, LEVOTHROID) 100 MCG tablet Take 1 tablet (100 mcg total) by mouth daily. 90 tablet 0  . pantoprazole (PROTONIX) 40 MG tablet Take 1 tablet (40 mg total) by mouth daily. 90 tablet 0  . TRULICITY 7.41 UL/8.4TX SOPN INJ 0.75 MG [place] Q 7 DAYS  11  . vitamin B-12 (CYANOCOBALAMIN) 500 MCG tablet Take 500 mcg by mouth at bedtime.     . [DISCONTINUED] TAZTIA XT 180 MG 24 hr capsule TAKE ONE CAPSULE BY MOUTH EVERY DAY 90 capsule 0   No current facility-administered medications on file prior to visit.     BP (!) 140/100   Pulse (!) 51   Temp 97.7 F (36.5 C) (Oral)   Ht 5\' 5"  (1.651 m)   Wt 153 lb 1.9 oz (69.5 kg)   SpO2 99%   BMI 25.48 kg/m    Objective:   Physical Exam  Constitutional: She appears well-nourished.  Neck: Neck supple.  Cardiovascular: Normal rate and regular rhythm.  Pulmonary/Chest: Effort normal and breath sounds normal.  Skin: Skin is warm and dry.  Psychiatric: She has a normal mood and affect.          Assessment & Plan:

## 2017-05-15 NOTE — Patient Instructions (Signed)
I sent refills of your medications to your pharmacy.   Stop by the lab prior to leaving today. I will notify you of your results once received.   Call your endocrinologist as discussed.  It was a pleasure meeting you!

## 2017-05-15 NOTE — Assessment & Plan Note (Signed)
Following with endocrinology. Refilled Metformin per patient requests. BMP pending.

## 2017-05-15 NOTE — Assessment & Plan Note (Signed)
Above goal today, refilled Lisinopril. Repeat BMP pending given last reading with potassium of 5.5. May need to remove ACE if potassium high.

## 2017-05-15 NOTE — Assessment & Plan Note (Signed)
Lipid panel in October 2018 with LDL at goal. Continue rosuvastatin. Refills provided today.

## 2017-05-18 ENCOUNTER — Encounter: Payer: Self-pay | Admitting: *Deleted

## 2017-06-17 DIAGNOSIS — E785 Hyperlipidemia, unspecified: Secondary | ICD-10-CM | POA: Diagnosis not present

## 2017-06-17 DIAGNOSIS — E1159 Type 2 diabetes mellitus with other circulatory complications: Secondary | ICD-10-CM | POA: Diagnosis not present

## 2017-06-17 DIAGNOSIS — E1169 Type 2 diabetes mellitus with other specified complication: Secondary | ICD-10-CM | POA: Insufficient documentation

## 2017-06-17 DIAGNOSIS — Z794 Long term (current) use of insulin: Secondary | ICD-10-CM | POA: Diagnosis not present

## 2017-06-17 DIAGNOSIS — E039 Hypothyroidism, unspecified: Secondary | ICD-10-CM | POA: Diagnosis not present

## 2017-06-17 DIAGNOSIS — E1142 Type 2 diabetes mellitus with diabetic polyneuropathy: Secondary | ICD-10-CM | POA: Diagnosis not present

## 2017-06-17 DIAGNOSIS — I10 Essential (primary) hypertension: Secondary | ICD-10-CM | POA: Diagnosis not present

## 2017-06-17 DIAGNOSIS — E1143 Type 2 diabetes mellitus with diabetic autonomic (poly)neuropathy: Secondary | ICD-10-CM | POA: Diagnosis not present

## 2017-06-17 HISTORY — DX: Type 2 diabetes mellitus with other specified complication: E11.69

## 2017-06-17 LAB — HEMOGLOBIN A1C: HEMOGLOBIN A1C: 7.6

## 2017-06-17 LAB — TSH: TSH: 10.84 — AB (ref 0.41–5.90)

## 2017-06-22 ENCOUNTER — Other Ambulatory Visit: Payer: Self-pay | Admitting: Family Medicine

## 2017-06-22 ENCOUNTER — Encounter: Payer: Self-pay | Admitting: Family Medicine

## 2017-06-22 NOTE — Progress Notes (Signed)
Abstracted [facility]/thx dmf

## 2017-07-06 ENCOUNTER — Telehealth: Payer: Self-pay | Admitting: Family Medicine

## 2017-07-06 NOTE — Telephone Encounter (Signed)
She really isn't a new pt, just a transfer. That is fine.

## 2017-07-06 NOTE — Telephone Encounter (Signed)
Appointment 5/30 pt aware °

## 2017-07-06 NOTE — Telephone Encounter (Signed)
See below message is it ok to schedule on Thursday 30 min appointment  Copied from [place]. Topic: Appointment Scheduling - New Patient >> Jul 06, 2017  8:37 AM Boyd Kerbs wrote: New patient has been scheduled for your office. Provider: Silvio Pate Date of Appointment: 4-16  Her daughter brings her to appt. And she just started new job.  Ailynn is asking to reschedule appt.  I did let her know Dr. Silvio Pate sees new patients on Tuesday and Wednesdays at 11:30 and do not have anything through July.. There is some physicals in those slots.   Her daughter is off on Thursdays and would love to do it on a Thursday if possible..  Please call patient and see if can help with this.   those slots and not sure   Route to department's P[facility] pool.

## 2017-08-04 ENCOUNTER — Ambulatory Visit: Payer: Medicare Other | Admitting: Internal Medicine

## 2017-09-17 ENCOUNTER — Ambulatory Visit (INDEPENDENT_AMBULATORY_CARE_PROVIDER_SITE_OTHER): Payer: PPO | Admitting: Internal Medicine

## 2017-09-17 ENCOUNTER — Encounter: Payer: Self-pay | Admitting: Internal Medicine

## 2017-09-17 VITALS — BP 104/72 | HR 74 | Temp 98.1°F | Ht 65.0 in | Wt 151.0 lb

## 2017-09-17 DIAGNOSIS — M069 Rheumatoid arthritis, unspecified: Secondary | ICD-10-CM | POA: Insufficient documentation

## 2017-09-17 DIAGNOSIS — Z23 Encounter for immunization: Secondary | ICD-10-CM

## 2017-09-17 DIAGNOSIS — E039 Hypothyroidism, unspecified: Secondary | ICD-10-CM

## 2017-09-17 DIAGNOSIS — F3170 Bipolar disorder, currently in remission, most recent episode unspecified: Secondary | ICD-10-CM | POA: Diagnosis not present

## 2017-09-17 DIAGNOSIS — M05741 Rheumatoid arthritis with rheumatoid factor of right hand without organ or systems involvement: Secondary | ICD-10-CM | POA: Diagnosis not present

## 2017-09-17 DIAGNOSIS — M05742 Rheumatoid arthritis with rheumatoid factor of left hand without organ or systems involvement: Secondary | ICD-10-CM

## 2017-09-17 DIAGNOSIS — E1140 Type 2 diabetes mellitus with diabetic neuropathy, unspecified: Secondary | ICD-10-CM | POA: Diagnosis not present

## 2017-09-17 DIAGNOSIS — G894 Chronic pain syndrome: Secondary | ICD-10-CM

## 2017-09-17 LAB — HM DIABETES FOOT EXAM

## 2017-09-17 NOTE — Assessment & Plan Note (Signed)
Seems to be quiet Will check sed rate and CRP

## 2017-09-17 NOTE — Assessment & Plan Note (Signed)
Doing better now No interest in narcotics now

## 2017-09-17 NOTE — Progress Notes (Signed)
Subjective:    Patient ID: NYHLA MOUNTJOY, female    DOB: 12-02-1955, 62 y.o.   MRN: [id number]  HPI Here to establish care---transferring from Dr Deborra Medina  Doing okay Feels she is doing much better than in the past Had been in SNF at [facility]---- Parkinsonism, etc. Trouble walking May have been from medication for bipolar disorder--which was stopped  Daughter and granddaughter live with her Has now been driving short distances Independent with ADLs and mostly does the instrumental ADLs while daughter at work  Classic type 1 bipolar ---manic without meds depakote has controlled her well Some question of schizophrenia but doesn't seem clear No recent depression  Diabetes since ~0932 Tolerating trulicity Checks sugars 1-2 times a day. 130-170 Past insulin --but stopped with better eating Known gastroparesis in the past No apparent pain in feet---the neuropathy is better. Has decreased the gabapentin  On thyroid med No problems with that  Chronic pain in the past No longer on the narcotics  Diagnosed with rheumatoid arthritis Did have positive RF MTX in past  now just NSAIDs for the most part  Current Outpatient Medications on File Prior to Visit  Medication Sig Dispense Refill  . acetaminophen (TYLENOL) 650 MG CR tablet Take 650 mg by mouth 3 (three) times daily as needed for pain.    Marland Kitchen BIOTIN PO Take 1 tablet by mouth daily.    Marland Kitchen diltiazem (CARDIZEM CD) 180 MG 24 hr capsule TAKE 1 CAPSULE BY MOUTH DAILY    . divalproex (DEPAKOTE ER) 500 MG 24 hr tablet TAKE 1 TABLET BY MOUTH TWICE DAILY 180 tablet 0  . gabapentin (NEURONTIN) 100 MG capsule LIMIT 1 TO 2 CAPSULES BY MOUTH TWICE TO THREE TIMES DAILY IF TOLERATED (Patient taking differently: Take 100 mg by mouth daily. LIMIT 1 TO 2 CAPSULES BY MOUTH TWICE TO THREE TIMES DAILY IF TOLERATED) 180 capsule 0  . levothyroxine (SYNTHROID, LEVOTHROID) 100 MCG tablet Take 1 tablet (100 mcg total) by mouth daily. 90 tablet 0  . lisinopril  (PRINIVIL,ZESTRIL) 5 MG tablet Take 1 tablet (5 mg total) by mouth daily. 90 tablet 1  . metFORMIN (GLUCOPHAGE) 500 MG tablet TAKE 1 TABLET(500 MG) BY MOUTH TWICE DAILY WITH A MEAL 180 tablet 1  . pantoprazole (PROTONIX) 40 MG tablet Take 1 tablet (40 mg total) by mouth daily. 90 tablet 0  . rosuvastatin (CRESTOR) 20 MG tablet Take 1 tablet (20 mg total) by mouth daily. 90 tablet 1  . TRULICITY 3.55 DD/2.2GU SOPN INJ 0.75 MG [place] Q 7 DAYS  11  . vitamin B-12 (CYANOCOBALAMIN) 500 MCG tablet Take 500 mcg by mouth at bedtime.     . [DISCONTINUED] TAZTIA XT 180 MG 24 hr capsule TAKE ONE CAPSULE BY MOUTH EVERY DAY 90 capsule 0   No current facility-administered medications on file prior to visit.     Allergies  Allergen Reactions  . Glyburide Other (See Comments)    Reaction:  Dizziness and confusion   . Seroquel [Quetiapine Fumarate] Diarrhea, Nausea And Vomiting and Other (See Comments)    Reaction:  Confusion   . Byetta 10 Mcg Pen [Exenatide] Nausea And Vomiting    Other reaction(s): Nausea And Vomiting    Past Medical History:  Diagnosis Date  . Anxiety    with panic  . Bipolar disorder ([place])   . CAD (coronary artery disease)    multiple caths  . Diabetes mellitus   . Enlarged liver   . Enlarged pituitary gland ([place])   .  Fibromyalgia   . Hyperlipidemia   . Hypertension   . Hypothyroidism   . Kidney stones   . Low back pain   . Nephrolithiasis   . Osteoarthritis   . Osteoporosis   . Parkinsonism ([place]) 2016   better after antipsychotics stopped  . Rheumatoid arthritis ([place])   . Scoliosis     Past Surgical History:  Procedure Laterality Date  . BLADDER SURGERY     ?bladder sling   . CARDIAC CATHETERIZATION     x3  . CATARACT EXTRACTION, BILATERAL    . COLONOSCOPY  multiple   2005 [place] records not available-polyps x 3  . COLONOSCOPY WITH PROPOFOL N/A 10/12/2015   Procedure: COLONOSCOPY WITH PROPOFOL;  Surgeon: Lollie Sails, MD;  Location: [facility] ENDOSCOPY;   Service: Endoscopy;  Laterality: N/A;  . ESOPHAGEAL DILATION    . kidney stone removal    . TONSILLECTOMY    . VAGINAL HYSTERECTOMY      Family History  Problem Relation Age of Onset  . Hypertension Mother   . Diabetes Mother   . Bipolar disorder Father   . Hypertension Father   . Colon cancer Paternal Aunt   . Colon cancer Paternal Grandmother   . Thyroid disease Neg Hx   . Breast cancer Neg Hx     Social History   Socioeconomic History  . Marital status: Widowed    Spouse name: Not on file  . Number of children: 3  . Years of education: 58  . Highest education level: Not on file  Occupational History  . Occupation: [place]: disabled  Social Needs  . Financial resource strain: Not on file  . Food insecurity:    Worry: Not on file    Inability: Not on file  . Transportation needs:    Medical: Not on file    Non-medical: Not on file  Tobacco Use  . Smoking status: Never Smoker  . Smokeless tobacco: Never Used  Substance and Sexual Activity  . Alcohol use: Yes    Comment: very rare  . Drug use: No  . Sexual activity: Not on file  Lifestyle  . Physical activity:    Days per week: Not on file    Minutes per session: Not on file  . Stress: Not on file  Relationships  . Social connections:    Talks on phone: Not on file    Gets together: Not on file    Attends religious service: Not on file    Active member of club or organization: Not on file    Attends meetings of clubs or organizations: Not on file    Relationship status: Not on file  . Intimate partner violence:    Fear of current or ex partner: Not on file    Emotionally abused: Not on file    Physically abused: Not on file    Forced sexual activity: Not on file  Other Topics Concern  . Not on file  Social History Narrative   Has a living will- desires CPR but no prolonged life support if futile.   Daughter [name] is health care POA   Not sure about tube feeds    Review of Systems Sleep has been "wacko" lately. Sleeping in day and then up at night Appetite is good Weight stable protonix for GERD    Objective:   Physical Exam  Constitutional: She appears well-developed. No distress.  Neck: No thyromegaly present.  Cardiovascular: Normal rate,  regular rhythm and normal heart sounds. Exam reveals no gallop.  No murmur heard. Respiratory: Effort normal and breath sounds normal. No respiratory distress. She has no wheezes. She has no rales.  GI: Soft. There is no tenderness.  Musculoskeletal: She exhibits no edema.  No synovitis or tenderness in hands or knees  Lymphadenopathy:    She has no cervical adenopathy.  Neurological:  Decreased sensation in feet  Skin: No rash noted. No erythema.  No foot lesions  Psychiatric: She has a normal mood and affect. Her behavior is normal.           Assessment & Plan:

## 2017-09-17 NOTE — Assessment & Plan Note (Signed)
Seems to be clinically euthyroid

## 2017-09-17 NOTE — Addendum Note (Signed)
Addended by: Pilar Grammes on: 09/17/2017 05:05 PM   Modules accepted: Orders

## 2017-09-17 NOTE — Assessment & Plan Note (Signed)
Doing well with depakote as mood stabilizer

## 2017-09-17 NOTE — Assessment & Plan Note (Signed)
Doing well with her regimen Will check labs Weaning off gabapentin

## 2017-09-18 ENCOUNTER — Encounter: Payer: Self-pay | Admitting: *Deleted

## 2017-09-18 LAB — RENAL FUNCTION PANEL
ALBUMIN: 4.4 g/dL (ref 3.5–5.2)
BUN: 17 mg/dL (ref 6–23)
CALCIUM: 10.1 mg/dL (ref 8.4–10.5)
CO2: 30 meq/L (ref 19–32)
Chloride: 99 mEq/L (ref 96–112)
Creatinine, Ser: 1.25 mg/dL — ABNORMAL HIGH (ref 0.40–1.20)
GFR: 45.82 mL/min — ABNORMAL LOW (ref >60.00)
Glucose, Bld: 184 mg/dL — ABNORMAL HIGH (ref 70–99)
POTASSIUM: 4.1 meq/L (ref 3.5–5.1)
Phosphorus: 3.3 mg/dL (ref 2.3–4.6)
Sodium: 141 mEq/L (ref 135–145)

## 2017-09-18 LAB — TSH: TSH: 6.48 u[IU]/mL — ABNORMAL HIGH (ref 0.35–4.50)

## 2017-09-18 LAB — VALPROIC ACID LEVEL: VALPROIC ACID LVL: 110.1 mg/L — AB (ref 50.0–100.0)

## 2017-09-18 LAB — HEMOGLOBIN A1C: Hgb A1c MFr Bld: 7.6 % — ABNORMAL HIGH (ref 4.6–6.5)

## 2017-09-18 LAB — T4, FREE: FREE T4: 1.14 ng/dL (ref 0.60–1.60)

## 2017-09-29 ENCOUNTER — Encounter: Payer: Self-pay | Admitting: Internal Medicine

## 2017-09-29 ENCOUNTER — Ambulatory Visit (INDEPENDENT_AMBULATORY_CARE_PROVIDER_SITE_OTHER): Payer: PPO | Admitting: Internal Medicine

## 2017-09-29 VITALS — BP 118/84 | HR 71 | Temp 97.9°F | Ht 65.0 in | Wt 154.0 lb

## 2017-09-29 DIAGNOSIS — K047 Periapical abscess without sinus: Secondary | ICD-10-CM

## 2017-09-29 MED ORDER — AMOXICILLIN-POT CLAVULANATE 875-125 MG PO TABS: 1.0000 | ORAL_TABLET | Freq: Two times a day (BID) | ORAL | 1 refills | Status: DC

## 2017-09-29 NOTE — Assessment & Plan Note (Signed)
No systemic symptoms so hopefully no mandibular involvement Daughter will call for dental appt ASAP----[facility]? augmentin for a week  Call if not improving within 2-3 days--consider clinda

## 2017-09-29 NOTE — Progress Notes (Signed)
Subjective:    Patient ID: Lori Phillips, female    DOB: [birth year], 62 y.o.   MRN: [id number]  HPI Here due to tooth problem  Had significant swelling in right jaw-- since 2 days ago Slightly better now Very painful Entire mouth hurts--but the jaw is very painful  Current Outpatient Medications on File Prior to Visit  Medication Sig Dispense Refill  . acetaminophen (TYLENOL) 650 MG CR tablet Take 650 mg by mouth 3 (three) times daily as needed for pain.    Marland Kitchen BIOTIN PO Take 1 tablet by mouth daily.    Marland Kitchen diltiazem (CARDIZEM CD) 180 MG 24 hr capsule TAKE 1 CAPSULE BY MOUTH DAILY    . divalproex (DEPAKOTE ER) 500 MG 24 hr tablet TAKE 1 TABLET BY MOUTH TWICE DAILY 180 tablet 0  . gabapentin (NEURONTIN) 100 MG capsule LIMIT 1 TO 2 CAPSULES BY MOUTH TWICE TO THREE TIMES DAILY IF TOLERATED (Patient taking differently: Take 100 mg by mouth daily. LIMIT 1 TO 2 CAPSULES BY MOUTH TWICE TO THREE TIMES DAILY IF TOLERATED) 180 capsule 0  . levothyroxine (SYNTHROID, LEVOTHROID) 100 MCG tablet Take 1 tablet (100 mcg total) by mouth daily. 90 tablet 0  . lisinopril (PRINIVIL,ZESTRIL) 5 MG tablet Take 1 tablet (5 mg total) by mouth daily. 90 tablet 1  . metFORMIN (GLUCOPHAGE) 500 MG tablet TAKE 1 TABLET(500 MG) BY MOUTH TWICE DAILY WITH A MEAL 180 tablet 1  . pantoprazole (PROTONIX) 40 MG tablet Take 1 tablet (40 mg total) by mouth daily. 90 tablet 0  . rosuvastatin (CRESTOR) 20 MG tablet Take 1 tablet (20 mg total) by mouth daily. 90 tablet 1  . TRULICITY 6.30 ZS/0.1UX SOPN INJ 0.75 MG [place] Q 7 DAYS  11  . vitamin B-12 (CYANOCOBALAMIN) 500 MCG tablet Take 500 mcg by mouth at bedtime.     . [DISCONTINUED] TAZTIA XT 180 MG 24 hr capsule TAKE ONE CAPSULE BY MOUTH EVERY DAY 90 capsule 0   No current facility-administered medications on file prior to visit.     Allergies  Allergen Reactions  . Glyburide Other (See Comments)    Reaction:  Dizziness and confusion   . Seroquel [Quetiapine Fumarate]  Diarrhea, Nausea And Vomiting and Other (See Comments)    Reaction:  Confusion   . Byetta 10 Mcg Pen [Exenatide] Nausea And Vomiting    Other reaction(s): Nausea And Vomiting    Past Medical History:  Diagnosis Date  . Anxiety    with panic  . Bipolar disorder ([place])   . CAD (coronary artery disease)    multiple caths  . Diabetes mellitus   . Enlarged liver   . Enlarged pituitary gland ([place])   . Fibromyalgia   . Hyperlipidemia   . Hypertension   . Hypothyroidism   . Kidney stones   . Low back pain   . Nephrolithiasis   . Osteoarthritis   . Osteoporosis   . Parkinsonism ([place]) 2016   better after antipsychotics stopped  . Rheumatoid arthritis ([place])   . Scoliosis     Past Surgical History:  Procedure Laterality Date  . BLADDER SURGERY     ?bladder sling   . CARDIAC CATHETERIZATION     x3  . CATARACT EXTRACTION, BILATERAL    . COLONOSCOPY  multiple   2005 [place] records not available-polyps x 3  . COLONOSCOPY WITH PROPOFOL N/A 10/12/2015   Procedure: COLONOSCOPY WITH PROPOFOL;  Surgeon: Lollie Sails, MD;  Location: [facility] ENDOSCOPY;  Service: Endoscopy;  Laterality: N/A;  . ESOPHAGEAL DILATION    . kidney stone removal    . TONSILLECTOMY    . VAGINAL HYSTERECTOMY      Family History  Problem Relation Age of Onset  . Hypertension Mother   . Diabetes Mother   . Bipolar disorder Father   . Hypertension Father   . Colon cancer Paternal Aunt   . Colon cancer Paternal Grandmother   . Thyroid disease Neg Hx   . Breast cancer Neg Hx     Social History   Socioeconomic History  . Marital status: Widowed    Spouse name: Not on file  . Number of children: 3  . Years of education: 69  . Highest education level: Not on file  Occupational History  . Occupation: [place]: disabled  Social Needs  . Financial resource strain: Not on file  . Food insecurity:    Worry: Not on file    Inability: Not on file  .  Transportation needs:    Medical: Not on file    Non-medical: Not on file  Tobacco Use  . Smoking status: Never Smoker  . Smokeless tobacco: Never Used  Substance and Sexual Activity  . Alcohol use: Yes    Comment: very rare  . Drug use: No  . Sexual activity: Not on file  Lifestyle  . Physical activity:    Days per week: Not on file    Minutes per session: Not on file  . Stress: Not on file  Relationships  . Social connections:    Talks on phone: Not on file    Gets together: Not on file    Attends religious service: Not on file    Active member of club or organization: Not on file    Attends meetings of clubs or organizations: Not on file    Relationship status: Not on file  . Intimate partner violence:    Fear of current or ex partner: Not on file    Emotionally abused: Not on file    Physically abused: Not on file    Forced sexual activity: Not on file  Other Topics Concern  . Not on file  Social History Narrative   Has a living will- desires CPR but no prolonged life support if futile.   Daughter [name] is health care POA   Not sure about tube feeds   Review of Systems No fever No chills or sweats Only able to eat soft foods    Objective:   Physical Exam  HENT:  Swelling and tenderness of mid right lower jaw All lower teeth broken Redness and swelling (and tenderness) along lower molars (all broken off)  Neck: No thyromegaly present.  Lymphadenopathy:    She has no cervical adenopathy.           Assessment & Plan:

## 2017-10-04 ENCOUNTER — Encounter: Payer: Self-pay | Admitting: Internal Medicine

## 2017-10-04 ENCOUNTER — Other Ambulatory Visit: Payer: Self-pay | Admitting: Family Medicine

## 2017-10-05 MED ORDER — DIVALPROEX SODIUM ER 500 MG PO TB24
ORAL_TABLET | ORAL | 3 refills | Status: DC
Start: 2017-10-05 — End: 2018-09-06

## 2017-10-05 MED ORDER — DILTIAZEM HCL ER COATED BEADS 180 MG PO CP24: 180.0000 mg | ORAL_CAPSULE | Freq: Every day | ORAL | 3 refills | Status: DC

## 2017-10-05 NOTE — Telephone Encounter (Signed)
SB-Plz see refill req/thx dmf 

## 2017-10-07 ENCOUNTER — Ambulatory Visit: Payer: Medicare Other

## 2017-10-08 DIAGNOSIS — E1159 Type 2 diabetes mellitus with other circulatory complications: Secondary | ICD-10-CM | POA: Diagnosis not present

## 2017-10-08 DIAGNOSIS — I10 Essential (primary) hypertension: Secondary | ICD-10-CM | POA: Diagnosis not present

## 2017-10-08 DIAGNOSIS — E1142 Type 2 diabetes mellitus with diabetic polyneuropathy: Secondary | ICD-10-CM | POA: Diagnosis not present

## 2017-10-08 DIAGNOSIS — E1169 Type 2 diabetes mellitus with other specified complication: Secondary | ICD-10-CM | POA: Diagnosis not present

## 2017-10-08 DIAGNOSIS — E785 Hyperlipidemia, unspecified: Secondary | ICD-10-CM | POA: Diagnosis not present

## 2017-10-08 DIAGNOSIS — E039 Hypothyroidism, unspecified: Secondary | ICD-10-CM | POA: Diagnosis not present

## 2017-10-14 ENCOUNTER — Encounter: Payer: Medicare Other | Admitting: Family Medicine

## 2017-10-15 ENCOUNTER — Encounter: Payer: Medicare Other | Admitting: Internal Medicine

## 2017-10-15 ENCOUNTER — Other Ambulatory Visit: Payer: Self-pay | Admitting: Internal Medicine

## 2017-10-15 DIAGNOSIS — E1169 Type 2 diabetes mellitus with other specified complication: Secondary | ICD-10-CM

## 2017-10-15 DIAGNOSIS — I10 Essential (primary) hypertension: Secondary | ICD-10-CM

## 2017-10-15 DIAGNOSIS — E785 Hyperlipidemia, unspecified: Principal | ICD-10-CM

## 2017-10-15 NOTE — Telephone Encounter (Signed)
Copied from [place] [id number]. Topic: Quick Communication - Rx Refill/Question >> Oct 15, 2017  9:37 AM Selinda Flavin B, NT wrote: Medication: lisinopril (PRINIVIL,ZESTRIL) 5 MG tablet pantoprazole (PROTONIX) 40 MG tablet  levothyroxine (SYNTHROID, LEVOTHROID) 100 MCG tablet rosuvastatin (CRESTOR) 20 MG tablet  Has the patient contacted their pharmacy? Yes.   (Agent: If no, request that the patient contact the pharmacy for the refill.) (Agent: If yes, when and what did the pharmacy advise?)  Preferred Pharmacy (with phone number or street name): [facility] [id number] - [place], [place]: Please be advised that RX refills may take up to 3 business days. We ask that you follow-up with your pharmacy.   **Patient is requesting that Dr Silvio Pate fill these prescriptions from now on. States she is not going to go back to her diabetes Dr. Lynnda Child like these in 90 day supplies. CB: [phone]**

## 2017-10-16 MED ORDER — ROSUVASTATIN CALCIUM 20 MG PO TABS
20.0000 mg | ORAL_TABLET | Freq: Every day | ORAL | 3 refills | Status: DC
Start: 2017-10-16 — End: 2018-12-06

## 2017-10-16 MED ORDER — LEVOTHYROXINE SODIUM 100 MCG PO TABS: 100.0000 ug | ORAL_TABLET | Freq: Every day | ORAL | 3 refills | Status: DC

## 2017-10-16 MED ORDER — LISINOPRIL 5 MG PO TABS: 5.0000 mg | ORAL_TABLET | Freq: Every day | ORAL | 3 refills | Status: DC

## 2017-10-16 MED ORDER — PANTOPRAZOLE SODIUM 40 MG PO TBEC: 40.0000 mg | DELAYED_RELEASE_TABLET | Freq: Every day | ORAL | 3 refills | Status: DC

## 2017-10-16 NOTE — Telephone Encounter (Signed)
Rxs sent electronically.  

## 2017-10-16 NOTE — Telephone Encounter (Signed)
Patient is requesting that Dr Silvio Pate fill medications listed below from now on. States she is not going to go back to her diabetes doctor. Would like these in 90 day supplies. CB: [phone]   LOV: 09/17/17 PCP: Dr. Hilton Cork in [place]

## 2018-02-25 MED ORDER — TRULICITY 0.75 MG/0.5ML ~~LOC~~ SOAJ: SUBCUTANEOUS | 12 refills | Status: DC

## 2018-03-25 ENCOUNTER — Encounter: Payer: Self-pay | Admitting: Internal Medicine

## 2018-03-25 ENCOUNTER — Ambulatory Visit (INDEPENDENT_AMBULATORY_CARE_PROVIDER_SITE_OTHER): Payer: PPO | Admitting: Internal Medicine

## 2018-03-25 ENCOUNTER — Ambulatory Visit (INDEPENDENT_AMBULATORY_CARE_PROVIDER_SITE_OTHER)
Admission: RE | Admit: 2018-03-25 | Discharge: 2018-03-25 | Disposition: A | Discharge status: Home / Self Care | Payer: PPO | Source: Ambulatory Visit | Attending: Internal Medicine | Admitting: Internal Medicine

## 2018-03-25 VITALS — BP 116/78 | HR 64 | Temp 97.6°F | Ht 65.0 in | Wt 148.0 lb

## 2018-03-25 DIAGNOSIS — I10 Essential (primary) hypertension: Secondary | ICD-10-CM | POA: Diagnosis not present

## 2018-03-25 DIAGNOSIS — M05741 Rheumatoid arthritis with rheumatoid factor of right hand without organ or systems involvement: Secondary | ICD-10-CM | POA: Diagnosis not present

## 2018-03-25 DIAGNOSIS — E1140 Type 2 diabetes mellitus with diabetic neuropathy, unspecified: Secondary | ICD-10-CM | POA: Diagnosis not present

## 2018-03-25 DIAGNOSIS — M06041 Rheumatoid arthritis without rheumatoid factor, right hand: Secondary | ICD-10-CM | POA: Diagnosis not present

## 2018-03-25 DIAGNOSIS — Z7189 Other specified counseling: Secondary | ICD-10-CM | POA: Diagnosis not present

## 2018-03-25 DIAGNOSIS — Z0000 Encounter for general adult medical examination without abnormal findings: Secondary | ICD-10-CM

## 2018-03-25 DIAGNOSIS — Z1239 Encounter for other screening for malignant neoplasm of breast: Secondary | ICD-10-CM

## 2018-03-25 DIAGNOSIS — M05742 Rheumatoid arthritis with rheumatoid factor of left hand without organ or systems involvement: Secondary | ICD-10-CM

## 2018-03-25 DIAGNOSIS — F3170 Bipolar disorder, currently in remission, most recent episode unspecified: Secondary | ICD-10-CM

## 2018-03-25 DIAGNOSIS — M06042 Rheumatoid arthritis without rheumatoid factor, left hand: Secondary | ICD-10-CM | POA: Diagnosis not present

## 2018-03-25 NOTE — Progress Notes (Signed)
Subjective:    Patient ID: Lori Phillips, female    DOB: [birth year], 62 y.o.   MRN: [id number]  HPI Here with daughter for Medicare wellness visit and follow up of chronic health conditions Reviewed form and advanced directives Reviewed other doctors No alcohol or tobacco No exercise---discussed Vision is okay---but notices "fog" over left eye. Will get this checked Hearing okay Stumbled in bathroom once almost a year ago---mild bruising only  Drives short distances---to get granddaughter to bus stop, local stores, etc Does all the housework  Hasn't been checking sugars lately No recent pain in feet Has left plantar callous Still with bed sore on buttock--from 3 years ago. Cream helps some  Some memory issues Daughter finds she repeats herself  Not manic recently Fairly level Some bouts with depression---like a few hours only Intermittent crying spells  Some knee pain and hips Some daily back pain--relates to scoliosis Uses tylenol and OTC creams Only has pain in hands if overdoes with housework May need some hours of rest if back gets bad (like after work)  Current Outpatient Medications on File Prior to Visit  Medication Sig Dispense Refill  . acetaminophen (TYLENOL) 650 MG CR tablet Take 650 mg by mouth 3 (three) times daily as needed for pain.    Marland Kitchen BIOTIN PO Take 1 tablet by mouth daily.    Marland Kitchen diltiazem (CARDIZEM CD) 180 MG 24 hr capsule Take 1 capsule (180 mg total) by mouth daily. 90 capsule 3  . divalproex (DEPAKOTE ER) 500 MG 24 hr tablet TAKE 1 TABLET BY MOUTH TWICE DAILY 180 tablet 3  . levothyroxine (SYNTHROID, LEVOTHROID) 100 MCG tablet Take 1 tablet (100 mcg total) by mouth daily. 90 tablet 3  . lisinopril (PRINIVIL,ZESTRIL) 5 MG tablet Take 1 tablet (5 mg total) by mouth daily. 90 tablet 3  . metFORMIN (GLUCOPHAGE) 500 MG tablet TAKE 1 TABLET(500 MG) BY MOUTH TWICE DAILY WITH A MEAL 180 tablet 1  . pantoprazole (PROTONIX) 40 MG tablet Take 1 tablet (40 mg  total) by mouth daily. 90 tablet 3  . rosuvastatin (CRESTOR) 20 MG tablet Take 1 tablet (20 mg total) by mouth daily. 90 tablet 3  . TRULICITY 3.81 OF/7.5ZW SOPN INJ 0.75 MG [place] Q 7 DAYS 4 pen 12  . vitamin B-12 (CYANOCOBALAMIN) 500 MCG tablet Take 500 mcg by mouth at bedtime.     Marland Kitchen VITAMIN D PO Take by mouth.    . [DISCONTINUED] TAZTIA XT 180 MG 24 hr capsule TAKE ONE CAPSULE BY MOUTH EVERY DAY 90 capsule 0   No current facility-administered medications on file prior to visit.     Allergies  Allergen Reactions  . Glyburide Other (See Comments)    Reaction:  Dizziness and confusion   . Seroquel [Quetiapine Fumarate] Diarrhea, Nausea And Vomiting and Other (See Comments)    Reaction:  Confusion   . Byetta 10 Mcg Pen [Exenatide] Nausea And Vomiting    Other reaction(s): Nausea And Vomiting    Past Medical History:  Diagnosis Date  . Anxiety    with panic  . Bipolar disorder ([place])   . CAD (coronary artery disease)    multiple caths  . Diabetes mellitus   . Enlarged liver   . Enlarged pituitary gland ([place])   . Fibromyalgia   . Hyperlipidemia   . Hypertension   . Hypothyroidism   . Kidney stones   . Low back pain   . Nephrolithiasis   . Osteoarthritis   . Osteoporosis   .  Parkinsonism ([place]) 2016   better after antipsychotics stopped  . Rheumatoid arthritis ([place])   . Scoliosis     Past Surgical History:  Procedure Laterality Date  . BLADDER SURGERY     ?bladder sling   . CARDIAC CATHETERIZATION     x3  . CATARACT EXTRACTION, BILATERAL    . COLONOSCOPY  multiple   2005 [place] records not available-polyps x 3  . COLONOSCOPY WITH PROPOFOL N/A 10/12/2015   Procedure: COLONOSCOPY WITH PROPOFOL;  Surgeon: Lollie Sails, MD;  Location: [facility] ENDOSCOPY;  Service: Endoscopy;  Laterality: N/A;  . ESOPHAGEAL DILATION    . kidney stone removal    . TONSILLECTOMY    . VAGINAL HYSTERECTOMY      Family History  Problem Relation Age of Onset  . Hypertension Mother   .  Diabetes Mother   . Bipolar disorder Father   . Hypertension Father   . Colon cancer Paternal Aunt   . Colon cancer Paternal Grandmother   . Thyroid disease Neg Hx   . Breast cancer Neg Hx     Social History   Socioeconomic History  . Marital status: Widowed    Spouse name: Not on file  . Number of children: 3  . Years of education: 10  . Highest education level: Not on file  Occupational History  . Occupation: [place]: disabled  Social Needs  . Financial resource strain: Not on file  . Food insecurity:    Worry: Not on file    Inability: Not on file  . Transportation needs:    Medical: Not on file    Non-medical: Not on file  Tobacco Use  . Smoking status: Never Smoker  . Smokeless tobacco: Never Used  Substance and Sexual Activity  . Alcohol use: Yes    Comment: very rare  . Drug use: No  . Sexual activity: Not on file  Lifestyle  . Physical activity:    Days per week: Not on file    Minutes per session: Not on file  . Stress: Not on file  Relationships  . Social connections:    Talks on phone: Not on file    Gets together: Not on file    Attends religious service: Not on file    Active member of club or organization: Not on file    Attends meetings of clubs or organizations: Not on file    Relationship status: Not on file  . Intimate partner violence:    Fear of current or ex partner: Not on file    Emotionally abused: Not on file    Physically abused: Not on file    Forced sexual activity: Not on file  Other Topics Concern  . Not on file  Social History Narrative   Has a living will- desires CPR but no prolonged life support if futile.   Daughter [name] is health care POA   Not sure about tube feeds   Review of Systems  Appetite is okay Weight down slightly Goes to sleep early--often up early (like 4AM) Wears seat belt Few teeth--getting the rest of them pulled Dry skin---no suspicious lesions Bowels somewhat  loose. No blood No heartburn. Dysphagia about a month ago--but mild and better now Voids okay. Some urge incontinence. Wears pad No chest pain or palpitations No dizziness No syncope Not steady on her feet still---has to walk slowly No SOB     Objective:   Physical Exam  Constitutional: She  is oriented to person, place, and time. She appears well-developed. No distress.  HENT:  Mouth/Throat: Oropharynx is clear and moist. No oropharyngeal exudate.  Few,broken teeth  Neck: No thyromegaly present.  Cardiovascular: Normal rate, regular rhythm, normal heart sounds and intact distal pulses. Exam reveals no gallop.  No murmur heard. Respiratory: Effort normal and breath sounds normal. No respiratory distress. She has no wheezes. She has no rales.  GI: Soft. There is no tenderness.  Musculoskeletal: She exhibits no edema or tenderness.  Thickening in DIPs in hands--not tender  Lymphadenopathy:    She has no cervical adenopathy.  Neurological: She is alert and oriented to person, place, and time.  President---"Trump, Obama, Clinton was there..." [id number] Recall 3/3  Mild decrease in sensation in plantar feet  Skin:  No foot lesions Hypertrophic area below pilonidal area--no ulcer  Psychiatric: She has a normal mood and affect. Her behavior is normal.           Assessment & Plan:

## 2018-03-25 NOTE — Progress Notes (Signed)
Hearing Screening   Method: Audiometry   125Hz  250Hz  500Hz  1000Hz  2000Hz  3000Hz  4000Hz  6000Hz  8000Hz   Right ear:   20 20 20  20     Left ear:   20 20 20  20       Visual Acuity Screening   Right eye Left eye Both eyes  Without correction:     With correction: 20/20 20/50 20/20

## 2018-03-25 NOTE — Assessment & Plan Note (Signed)
Hopefully still good control Asked her to check at least weekly Mild neuropathy only

## 2018-03-25 NOTE — Assessment & Plan Note (Addendum)
I have personally reviewed the Medicare Annual Wellness questionnaire and have noted 1. The patient's medical and social history 2. Their use of alcohol, tobacco or illicit drugs 3. Their current medications and supplements 4. The patient's functional ability including ADL's, fall risks, home safety risks and hearing or visual             impairment. 5. Diet and physical activities 6. Evidence for depression or mood disorders  The patients weight, height, BMI and visual acuity have been recorded in the chart I have made referrals, counseling and provided education to the patient based review of the above and I have provided the pt with a written personalized care plan for preventive services.  I have provided you with a copy of your personalized plan for preventive services. Please take the time to review along with your updated medication list.  Daughter doesn't want her to get flu vaccine--always gets sick Colon due next June Will set up mammogram No pap due to hyster Discussed exercise Td with next injury due to cost

## 2018-03-25 NOTE — Assessment & Plan Note (Signed)
No mania Mild short depression Will cut valproic acid dose if level still high

## 2018-03-25 NOTE — Assessment & Plan Note (Signed)
See social history 

## 2018-03-25 NOTE — Assessment & Plan Note (Signed)
BP Readings from Last 3 Encounters:  03/25/18 116/78  09/29/17 118/84  09/17/17 104/72   Good control

## 2018-03-25 NOTE — Assessment & Plan Note (Addendum)
Will check ESR and hand x-rays If negative--will remove diagnosis If abnormal, will start MTX '10mg'$  daily

## 2018-03-26 LAB — COMPREHENSIVE METABOLIC PANEL
ALT: 7 U/L (ref 0–35)
AST: 12 U/L (ref 0–37)
Albumin: 4.5 g/dL (ref 3.5–5.2)
Alkaline Phosphatase: 50 U/L (ref 39–117)
BUN: 21 mg/dL (ref 6–23)
CHLORIDE: 100 meq/L (ref 96–112)
CO2: 27 mEq/L (ref 19–32)
Calcium: 10.6 mg/dL — ABNORMAL HIGH (ref 8.4–10.5)
Creatinine, Ser: 1.14 mg/dL (ref 0.40–1.20)
GFR: 50.87 mL/min — ABNORMAL LOW (ref >60.00)
Glucose, Bld: 112 mg/dL — ABNORMAL HIGH (ref 70–99)
POTASSIUM: 4.6 meq/L (ref 3.5–5.1)
SODIUM: 139 meq/L (ref 135–145)
Total Bilirubin: 0.4 mg/dL (ref 0.2–1.2)
Total Protein: 7.2 g/dL (ref 6.0–8.3)

## 2018-03-26 LAB — LIPID PANEL
Cholesterol: 249 mg/dL — ABNORMAL HIGH (ref 0–200)
HDL: 47.9 mg/dL (ref >39.00)
Total CHOL/HDL Ratio: 5
Triglycerides: 484 mg/dL — ABNORMAL HIGH (ref 0.0–149.0)

## 2018-03-26 LAB — CBC
HCT: 39.3 % (ref 36.0–46.0)
Hemoglobin: 13.1 g/dL (ref 12.0–15.0)
MCHC: 33.2 g/dL (ref 30.0–36.0)
MCV: 93.6 fl (ref 78.0–100.0)
PLATELETS: 263 10*3/uL (ref 150.0–400.0)
RBC: 4.2 Mil/uL (ref 3.87–5.11)
RDW: 14.1 % (ref 11.5–15.5)
WBC: 8.4 10*3/uL (ref 4.0–10.5)

## 2018-03-26 LAB — HEMOGLOBIN A1C: Hgb A1c MFr Bld: 6.9 % — ABNORMAL HIGH (ref 4.6–6.5)

## 2018-03-26 LAB — SEDIMENTATION RATE: Sed Rate: 6 mm/hr (ref 0–30)

## 2018-03-26 LAB — VALPROIC ACID LEVEL: Valproic Acid Lvl: 79.2 mg/L (ref 50.0–100.0)

## 2018-03-26 LAB — LDL CHOLESTEROL, DIRECT: Direct LDL: 145 mg/dL

## 2018-03-26 LAB — T4, FREE: Free T4: 0.94 ng/dL (ref 0.60–1.60)

## 2018-06-03 ENCOUNTER — Ambulatory Visit (INDEPENDENT_AMBULATORY_CARE_PROVIDER_SITE_OTHER): Payer: PPO | Admitting: Internal Medicine

## 2018-06-03 ENCOUNTER — Encounter: Payer: Self-pay | Admitting: Internal Medicine

## 2018-06-03 VITALS — BP 140/84 | HR 57 | Temp 97.6°F | Ht 65.0 in | Wt 153.0 lb

## 2018-06-03 DIAGNOSIS — E1143 Type 2 diabetes mellitus with diabetic autonomic (poly)neuropathy: Secondary | ICD-10-CM

## 2018-06-03 DIAGNOSIS — E1165 Type 2 diabetes mellitus with hyperglycemia: Secondary | ICD-10-CM

## 2018-06-03 DIAGNOSIS — I872 Venous insufficiency (chronic) (peripheral): Secondary | ICD-10-CM

## 2018-06-03 DIAGNOSIS — E1140 Type 2 diabetes mellitus with diabetic neuropathy, unspecified: Secondary | ICD-10-CM

## 2018-06-03 DIAGNOSIS — IMO0002 Reserved for concepts with insufficient information to code with codable children: Secondary | ICD-10-CM

## 2018-06-03 HISTORY — DX: Venous insufficiency (chronic) (peripheral): I87.2

## 2018-06-03 MED ORDER — METFORMIN HCL 500 MG PO TABS: ORAL_TABLET | ORAL | 3 refills | Status: DC

## 2018-06-03 MED ORDER — GABAPENTIN 100 MG PO CAPS: 200.0000 mg | ORAL_CAPSULE | Freq: Two times a day (BID) | ORAL | 3 refills | Status: DC

## 2018-06-03 NOTE — Progress Notes (Signed)
Subjective:    Patient ID: Lori Phillips, female    DOB: [birth year], 63 y.o.   MRN: [id number]  HPI Here due to edema With daughter and granddaughter  Over the past few weeks Burning and stinging They get red Hurts to have shoes and socks on  Has been off her metformin by mistake--so sugars up a little Also stopped the gabapentin like we discussed  No sig SOB Did slip and hit right ribs---this has caused some pain with breathing No chest pain No palpitations No dizziness or syncope  Current Outpatient Medications on File Prior to Visit  Medication Sig Dispense Refill  . acetaminophen (TYLENOL) 650 MG CR tablet Take 650 mg by mouth 3 (three) times daily as needed for pain.    Marland Kitchen BIOTIN PO Take 1 tablet by mouth daily.    Marland Kitchen diltiazem (CARDIZEM CD) 180 MG 24 hr capsule Take 1 capsule (180 mg total) by mouth daily. 90 capsule 3  . divalproex (DEPAKOTE ER) 500 MG 24 hr tablet TAKE 1 TABLET BY MOUTH TWICE DAILY 180 tablet 3  . levothyroxine (SYNTHROID, LEVOTHROID) 100 MCG tablet Take 1 tablet (100 mcg total) by mouth daily. 90 tablet 3  . lisinopril (PRINIVIL,ZESTRIL) 5 MG tablet Take 1 tablet (5 mg total) by mouth daily. 90 tablet 3  . metFORMIN (GLUCOPHAGE) 500 MG tablet TAKE 1 TABLET(500 MG) BY MOUTH TWICE DAILY WITH A MEAL 180 tablet 1  . pantoprazole (PROTONIX) 40 MG tablet Take 1 tablet (40 mg total) by mouth daily. 90 tablet 3  . rosuvastatin (CRESTOR) 20 MG tablet Take 1 tablet (20 mg total) by mouth daily. 90 tablet 3  . TRULICITY 4.09 BD/5.3GD SOPN INJ 0.75 MG [place] Q 7 DAYS 4 pen 12  . vitamin B-12 (CYANOCOBALAMIN) 500 MCG tablet Take 500 mcg by mouth at bedtime.     Marland Kitchen VITAMIN D PO Take by mouth.    . [DISCONTINUED] TAZTIA XT 180 MG 24 hr capsule TAKE ONE CAPSULE BY MOUTH EVERY DAY 90 capsule 0   No current facility-administered medications on file prior to visit.     Allergies  Allergen Reactions  . Glyburide Other (See Comments)    Reaction:  Dizziness and confusion    . Seroquel [Quetiapine Fumarate] Diarrhea, Nausea And Vomiting and Other (See Comments)    Reaction:  Confusion   . Byetta 10 Mcg Pen [Exenatide] Nausea And Vomiting    Other reaction(s): Nausea And Vomiting    Past Medical History:  Diagnosis Date  . Anxiety    with panic  . Bipolar disorder ([place])   . CAD (coronary artery disease)    multiple caths  . Diabetes mellitus   . Enlarged liver   . Enlarged pituitary gland ([place])   . Fibromyalgia   . Hyperlipidemia   . Hypertension   . Hypothyroidism   . Kidney stones   . Low back pain   . Nephrolithiasis   . Osteoarthritis   . Osteoporosis   . Parkinsonism ([place]) 2016   better after antipsychotics stopped  . Rheumatoid arthritis ([place])   . Scoliosis     Past Surgical History:  Procedure Laterality Date  . BLADDER SURGERY     ?bladder sling   . CARDIAC CATHETERIZATION     x3  . CATARACT EXTRACTION, BILATERAL    . COLONOSCOPY  multiple   2005 [place] records not available-polyps x 3  . COLONOSCOPY WITH PROPOFOL N/A 10/12/2015   Procedure: COLONOSCOPY WITH PROPOFOL;  Surgeon: Hassell Done  Kassie Mends, MD;  Location: [facility] ENDOSCOPY;  Service: Endoscopy;  Laterality: N/A;  . ESOPHAGEAL DILATION    . kidney stone removal    . TONSILLECTOMY    . VAGINAL HYSTERECTOMY      Family History  Problem Relation Age of Onset  . Hypertension Mother   . Diabetes Mother   . Bipolar disorder Father   . Hypertension Father   . Colon cancer Paternal Aunt   . Colon cancer Paternal Grandmother   . Thyroid disease Neg Hx   . Breast cancer Neg Hx     Social History   Socioeconomic History  . Marital status: Widowed    Spouse name: Not on file  . Number of children: 3  . Years of education: 83  . Highest education level: Not on file  Occupational History  . Occupation: [place]: disabled  Social Needs  . Financial resource strain: Not on file  . Food insecurity:    Worry: Not on file     Inability: Not on file  . Transportation needs:    Medical: Not on file    Non-medical: Not on file  Tobacco Use  . Smoking status: Never Smoker  . Smokeless tobacco: Never Used  Substance and Sexual Activity  . Alcohol use: Yes    Comment: very rare  . Drug use: No  . Sexual activity: Not on file  Lifestyle  . Physical activity:    Days per week: Not on file    Minutes per session: Not on file  . Stress: Not on file  Relationships  . Social connections:    Talks on phone: Not on file    Gets together: Not on file    Attends religious service: Not on file    Active member of club or organization: Not on file    Attends meetings of clubs or organizations: Not on file    Relationship status: Not on file  . Intimate partner violence:    Fear of current or ex partner: Not on file    Emotionally abused: Not on file    Physically abused: Not on file    Forced sexual activity: Not on file  Other Topics Concern  . Not on file  Social History Narrative   Has a living will- desires CPR but no prolonged life support if futile.   Daughter [name] is health care POA   Not sure about tube feeds   Review of Systems Does use salt Appetite is fine Sleep is variable---awakens several times Sleeps in bed---flat. No PND    Objective:   Physical Exam  Constitutional: She appears well-developed. No distress.  Neck: No thyromegaly present.  Cardiovascular: Normal rate, regular rhythm, normal heart sounds and intact distal pulses. Exam reveals no gallop.  No murmur heard. Respiratory: Effort normal and breath sounds normal. No respiratory distress. She has no wheezes. She has no rales.  Musculoskeletal:     Comments: Trace edema Mild stasis changes  Lymphadenopathy:    She has no cervical adenopathy.  Skin:  No leg ulcers           Assessment & Plan:

## 2018-06-03 NOTE — Assessment & Plan Note (Signed)
Reassured Nothing to suggest CHF or other etiology Started after car trip to [place] Discussed stopping salt Support hose/socks---especially if prolonged sitting If worsens, would try intermittent furosemide

## 2018-06-03 NOTE — Assessment & Plan Note (Signed)
I think the neuropathy is the main symptom now (stinging, etc) Will restart the gabapentin

## 2018-09-05 ENCOUNTER — Other Ambulatory Visit: Payer: Self-pay | Admitting: Internal Medicine

## 2018-09-16 ENCOUNTER — Ambulatory Visit (INDEPENDENT_AMBULATORY_CARE_PROVIDER_SITE_OTHER): Payer: PPO | Admitting: Internal Medicine

## 2018-09-16 ENCOUNTER — Encounter: Payer: Self-pay | Admitting: Internal Medicine

## 2018-09-16 VITALS — Temp 98.6°F | Ht 65.0 in | Wt 155.0 lb

## 2018-09-16 DIAGNOSIS — I872 Venous insufficiency (chronic) (peripheral): Secondary | ICD-10-CM

## 2018-09-16 DIAGNOSIS — E1140 Type 2 diabetes mellitus with diabetic neuropathy, unspecified: Secondary | ICD-10-CM | POA: Diagnosis not present

## 2018-09-16 DIAGNOSIS — I10 Essential (primary) hypertension: Secondary | ICD-10-CM | POA: Diagnosis not present

## 2018-09-16 MED ORDER — LISINOPRIL 5 MG PO TABS: 5.0000 mg | ORAL_TABLET | Freq: Every day | ORAL | 3 refills | Status: DC

## 2018-09-16 NOTE — Assessment & Plan Note (Signed)
Still feels her control is good Will try to arrange for a new meter Gabapentin is helping the neuropathy

## 2018-09-16 NOTE — Assessment & Plan Note (Signed)
Edema is better now I think a lot of her symptoms were more from the neuropathy

## 2018-09-16 NOTE — Progress Notes (Signed)
Subjective:    Patient ID: Lori Phillips, female    DOB: May 10, 1955, 63 y.o.   MRN: [id number]  HPI Virtual visit for follow up of edema and diabetic neuropathy Identification done Reviewed billing and she gave consent She is at car and I am in my office Daughter is there also  The swelling did resolve about 2 weeks after my last visit Pain in her legs is gone Is back on the gabapentin  Sugar still good--- control seems fine Needs new machine though  Current Outpatient Medications on File Prior to Visit  Medication Sig Dispense Refill  . acetaminophen (TYLENOL) 650 MG CR tablet Take 650 mg by mouth 3 (three) times daily as needed for pain.    Marland Kitchen BIOTIN PO Take 1 tablet by mouth daily.    Marland Kitchen diltiazem (CARDIZEM CD) 180 MG 24 hr capsule TAKE 1 CAPSULE(180 MG) BY MOUTH DAILY 90 capsule 0  . divalproex (DEPAKOTE ER) 500 MG 24 hr tablet TAKE 1 TABLET BY MOUTH TWICE DAILY 180 tablet 0  . gabapentin (NEURONTIN) 100 MG capsule Take 2 capsules (200 mg total) by mouth 2 (two) times daily. 360 capsule 3  . levothyroxine (SYNTHROID) 100 MCG tablet TAKE 1 TABLET(100 MCG) BY MOUTH DAILY 90 tablet 0  . lisinopril (PRINIVIL,ZESTRIL) 5 MG tablet Take 1 tablet (5 mg total) by mouth daily. 90 tablet 3  . metFORMIN (GLUCOPHAGE) 500 MG tablet TAKE 1 TABLET(500 MG) BY MOUTH TWICE DAILY WITH A MEAL 180 tablet 3  . pantoprazole (PROTONIX) 40 MG tablet TAKE 1 TABLET BY MOUTH DAILY 90 tablet 0  . rosuvastatin (CRESTOR) 20 MG tablet Take 1 tablet (20 mg total) by mouth daily. 90 tablet 3  . TRULICITY 7.41 UL/8.4TX SOPN INJ 0.75 MG [place] Q 7 DAYS 4 pen 12  . vitamin B-12 (CYANOCOBALAMIN) 500 MCG tablet Take 500 mcg by mouth at bedtime.     Marland Kitchen VITAMIN D PO Take by mouth.    . [DISCONTINUED] TAZTIA XT 180 MG 24 hr capsule TAKE ONE CAPSULE BY MOUTH EVERY DAY 90 capsule 0   No current facility-administered medications on file prior to visit.     Allergies  Allergen Reactions  . Glyburide Other (See Comments)    Reaction:  Dizziness and confusion   . Seroquel [Quetiapine Fumarate] Diarrhea, Nausea And Vomiting and Other (See Comments)    Reaction:  Confusion   . Byetta 10 Mcg Pen [Exenatide] Nausea And Vomiting    Other reaction(s): Nausea And Vomiting    Past Medical History:  Diagnosis Date  . Anxiety    with panic  . Bipolar disorder ([place])   . CAD (coronary artery disease)    multiple caths  . Diabetes mellitus   . Enlarged liver   . Enlarged pituitary gland ([place])   . Fibromyalgia   . Hyperlipidemia   . Hypertension   . Hypothyroidism   . Kidney stones   . Low back pain   . Nephrolithiasis   . Osteoarthritis   . Osteoporosis   . Parkinsonism ([place]) 2016   better after antipsychotics stopped  . Rheumatoid arthritis ([place])   . Scoliosis     Past Surgical History:  Procedure Laterality Date  . BLADDER SURGERY     ?bladder sling   . CARDIAC CATHETERIZATION     x3  . CATARACT EXTRACTION, BILATERAL    . COLONOSCOPY  multiple   2005 [place] records not available-polyps x 3  . COLONOSCOPY WITH PROPOFOL N/A 10/12/2015  Procedure: COLONOSCOPY WITH PROPOFOL;  Surgeon: Lollie Sails, MD;  Location: [facility] ENDOSCOPY;  Service: Endoscopy;  Laterality: N/A;  . ESOPHAGEAL DILATION    . kidney stone removal    . TONSILLECTOMY    . VAGINAL HYSTERECTOMY      Family History  Problem Relation Age of Onset  . Hypertension Mother   . Diabetes Mother   . Bipolar disorder Father   . Hypertension Father   . Colon cancer Paternal Aunt   . Colon cancer Paternal Grandmother   . Thyroid disease Neg Hx   . Breast cancer Neg Hx     Social History   Socioeconomic History  . Marital status: Widowed    Spouse name: Not on file  . Number of children: 3  . Years of education: 1  . Highest education level: Not on file  Occupational History  . Occupation: [place]: disabled  Social Needs  . Financial resource strain: Not on file  . Food  insecurity:    Worry: Not on file    Inability: Not on file  . Transportation needs:    Medical: Not on file    Non-medical: Not on file  Tobacco Use  . Smoking status: Never Smoker  . Smokeless tobacco: Never Used  Substance and Sexual Activity  . Alcohol use: Yes    Comment: very rare  . Drug use: No  . Sexual activity: Not on file  Lifestyle  . Physical activity:    Days per week: Not on file    Minutes per session: Not on file  . Stress: Not on file  Relationships  . Social connections:    Talks on phone: Not on file    Gets together: Not on file    Attends religious service: Not on file    Active member of club or organization: Not on file    Attends meetings of clubs or organizations: Not on file    Relationship status: Not on file  . Intimate partner violence:    Fear of current or ex partner: Not on file    Emotionally abused: Not on file    Physically abused: Not on file    Forced sexual activity: Not on file  Other Topics Concern  . Not on file  Social History Narrative   Has a living will- desires CPR but no prolonged life support if futile.   Daughter Lori Phillips is health care POA   Not sure about tube feeds   Review of Systems No chest pain No SOB Appetite is good Sleeping well    Objective:   Physical Exam  Constitutional: She appears well-developed. No distress.  Respiratory: Effort normal. No respiratory distress.  Musculoskeletal:        General: No edema.  Skin:  Stasis changes have improved--almost gone           Assessment & Plan:

## 2018-11-11 ENCOUNTER — Telehealth: Payer: Self-pay | Admitting: Internal Medicine

## 2018-11-11 MED ORDER — ONETOUCH VERIO VI STRP: ORAL_STRIP | 12 refills | Status: DC

## 2018-11-11 MED ORDER — ONETOUCH DELICA LANCETS 33G MISC: 1.0000 | Freq: Every day | 3 refills | Status: DC

## 2018-11-11 MED ORDER — ONETOUCH DELICA LANCING DEV MISC: 1.0000 | Freq: Once | 0 refills | Status: AC

## 2018-11-11 MED ORDER — ONETOUCH VERIO W/DEVICE KIT: 1.0000 | PACK | Freq: Once | 0 refills | Status: AC

## 2018-11-11 NOTE — Telephone Encounter (Signed)
Best number [phone]  Pt called she needs a rx for a glucose machine  Insurance will pay for Freestyle  One touch or precison She needs lancet and test strips also  Pt stated she would like to have a small machine  [facility] in [place]

## 2018-11-11 NOTE — Telephone Encounter (Signed)
Rxs sent electronically.  

## 2018-12-06 ENCOUNTER — Other Ambulatory Visit: Payer: Self-pay | Admitting: Internal Medicine

## 2018-12-06 DIAGNOSIS — E785 Hyperlipidemia, unspecified: Secondary | ICD-10-CM

## 2018-12-06 DIAGNOSIS — E1169 Type 2 diabetes mellitus with other specified complication: Secondary | ICD-10-CM

## 2018-12-23 ENCOUNTER — Encounter: Payer: Self-pay | Admitting: Internal Medicine

## 2018-12-23 ENCOUNTER — Ambulatory Visit (INDEPENDENT_AMBULATORY_CARE_PROVIDER_SITE_OTHER): Payer: PPO | Admitting: Internal Medicine

## 2018-12-23 VITALS — Wt 158.0 lb

## 2018-12-23 DIAGNOSIS — E1140 Type 2 diabetes mellitus with diabetic neuropathy, unspecified: Secondary | ICD-10-CM | POA: Diagnosis not present

## 2018-12-23 DIAGNOSIS — I872 Venous insufficiency (chronic) (peripheral): Secondary | ICD-10-CM

## 2018-12-23 NOTE — Assessment & Plan Note (Signed)
Sugars run on the high side but last A1c was fine Will see if we can get her in to recheck A1c Neuropathy under control again with the gabapentin

## 2018-12-23 NOTE — Progress Notes (Signed)
Subjective:    Patient ID: Lori Phillips, female    DOB: [birth year], 63 y.o.   MRN: [id number]  HPI Virtual visit for review of her edema and diabetes Identification done Reviewed billing and she gave consent I am in my office and she is home   No problems with the edema now Using the gabapentin again--neuropathy controlled  Did get a new machine  Sugars have been 170-180 but random (not all fasting) This is similar to before  Current Outpatient Medications on File Prior to Visit  Medication Sig Dispense Refill  . acetaminophen (TYLENOL) 650 MG CR tablet Take 650 mg by mouth 3 (three) times daily as needed for pain.    Marland Kitchen BIOTIN PO Take 1 tablet by mouth daily.    Marland Kitchen diltiazem (CARDIZEM CD) 180 MG 24 hr capsule TAKE 1 CAPSULE(180 MG) BY MOUTH DAILY 90 capsule 3  . divalproex (DEPAKOTE ER) 500 MG 24 hr tablet TAKE 1 TABLET BY MOUTH TWICE DAILY 180 tablet 3  . gabapentin (NEURONTIN) 100 MG capsule Take 2 capsules (200 mg total) by mouth 2 (two) times daily. 360 capsule 3  . glucose blood (ONETOUCH VERIO) test strip Use to check blood sugar once a day. Dx Code E11.40 100 each 12  . levothyroxine (SYNTHROID) 100 MCG tablet TAKE 1 TABLET(100 MCG) BY MOUTH DAILY 90 tablet 0  . lisinopril (ZESTRIL) 5 MG tablet Take 1 tablet (5 mg total) by mouth daily. 90 tablet 3  . metFORMIN (GLUCOPHAGE) 500 MG tablet TAKE 1 TABLET(500 MG) BY MOUTH TWICE DAILY WITH A MEAL 180 tablet 3  . OneTouch Delica Lancets 99991111 MISC 1 each by Does not apply route daily. Use to obtain blood sample for blood sugar once a day. Dx Code E11.40 100 each 3  . pantoprazole (PROTONIX) 40 MG tablet TAKE 1 TABLET BY MOUTH DAILY 90 tablet 0  . rosuvastatin (CRESTOR) 20 MG tablet TAKE 1 TABLET BY MOUTH DAILY 90 tablet 3  . TRULICITY A999333 0000000 SOPN INJ 0.75 MG [place] Q 7 DAYS 4 pen 12  . vitamin B-12 (CYANOCOBALAMIN) 500 MCG tablet Take 500 mcg by mouth at bedtime.     Marland Kitchen VITAMIN D PO Take by mouth.    . [DISCONTINUED] TAZTIA XT  180 MG 24 hr capsule TAKE ONE CAPSULE BY MOUTH EVERY DAY 90 capsule 0   No current facility-administered medications on file prior to visit.     Allergies  Allergen Reactions  . Glyburide Other (See Comments)    Reaction:  Dizziness and confusion   . Seroquel [Quetiapine Fumarate] Diarrhea, Nausea And Vomiting and Other (See Comments)    Reaction:  Confusion   . Byetta 10 Mcg Pen [Exenatide] Nausea And Vomiting    Other reaction(s): Nausea And Vomiting    Past Medical History:  Diagnosis Date  . Anxiety    with panic  . Bipolar disorder ([place])   . CAD (coronary artery disease)    multiple caths  . Diabetes mellitus   . Enlarged liver   . Enlarged pituitary gland ([place])   . Fibromyalgia   . Hyperlipidemia   . Hypertension   . Hypothyroidism   . Kidney stones   . Low back pain   . Nephrolithiasis   . Osteoarthritis   . Osteoporosis   . Parkinsonism ([place]) 2016   better after antipsychotics stopped  . Rheumatoid arthritis ([place])   . Scoliosis     Past Surgical History:  Procedure Laterality Date  . BLADDER SURGERY     ?  bladder sling   . CARDIAC CATHETERIZATION     x3  . CATARACT EXTRACTION, BILATERAL    . COLONOSCOPY  multiple   2005 [place] records not available-polyps x 3  . COLONOSCOPY WITH PROPOFOL N/A 10/12/2015   Procedure: COLONOSCOPY WITH PROPOFOL;  Surgeon: Lollie Sails, MD;  Location: [facility] ENDOSCOPY;  Service: Endoscopy;  Laterality: N/A;  . ESOPHAGEAL DILATION    . kidney stone removal    . TONSILLECTOMY    . VAGINAL HYSTERECTOMY      Family History  Problem Relation Age of Onset  . Hypertension Mother   . Diabetes Mother   . Bipolar disorder Father   . Hypertension Father   . Colon cancer Paternal Aunt   . Colon cancer Paternal Grandmother   . Thyroid disease Neg Hx   . Breast cancer Neg Hx     Social History   Socioeconomic History  . Marital status: Widowed    Spouse name: Not on file  . Number of children: 3  . Years of  education: 41  . Highest education level: Not on file  Occupational History  . Occupation: [place]: disabled  Social Needs  . Financial resource strain: Not on file  . Food insecurity    Worry: Not on file    Inability: Not on file  . Transportation needs    Medical: Not on file    Non-medical: Not on file  Tobacco Use  . Smoking status: Never Smoker  . Smokeless tobacco: Never Used  Substance and Sexual Activity  . Alcohol use: Yes    Comment: very rare  . Drug use: No  . Sexual activity: Not on file  Lifestyle  . Physical activity    Days per week: Not on file    Minutes per session: Not on file  . Stress: Not on file  Relationships  . Social Herbalist on phone: Not on file    Gets together: Not on file    Attends religious service: Not on file    Active member of club or organization: Not on file    Attends meetings of clubs or organizations: Not on file    Relationship status: Not on file  . Intimate partner violence    Fear of current or ex partner: Not on file    Emotionally abused: Not on file    Physically abused: Not on file    Forced sexual activity: Not on file  Other Topics Concern  . Not on file  Social History Narrative   Has a living will- desires CPR but no prolonged life support if futile.   Daughter [name] is health care POA   Not sure about tube feeds   Review of Systems Appetite is okay Weight is about the same Sleeps fairly well--up and down some nights though    Objective:   Physical Exam  Constitutional: She appears well-developed. No distress.  Respiratory: Effort normal. No respiratory distress.  Psychiatric: She has a normal mood and affect. Her behavior is normal.           Assessment & Plan:

## 2018-12-23 NOTE — Assessment & Plan Note (Signed)
Better now She is more careful with salt, elevating legs I guess

## 2018-12-24 ENCOUNTER — Telehealth: Payer: Self-pay | Admitting: Internal Medicine

## 2018-12-24 NOTE — Telephone Encounter (Signed)
I left a message on patient's voice mail to call back and schedule lab appointment to check A1c in the next couple of weeks and schedule AWV with Dr.Letvak in 6 months.

## 2018-12-30 ENCOUNTER — Other Ambulatory Visit (INDEPENDENT_AMBULATORY_CARE_PROVIDER_SITE_OTHER): Payer: PPO

## 2018-12-30 DIAGNOSIS — E1140 Type 2 diabetes mellitus with diabetic neuropathy, unspecified: Secondary | ICD-10-CM | POA: Diagnosis not present

## 2018-12-30 LAB — POCT GLYCOSYLATED HEMOGLOBIN (HGB A1C): Hemoglobin A1C: 8.8 % — AB (ref 4.0–5.6)

## 2019-01-04 ENCOUNTER — Telehealth: Payer: Self-pay

## 2019-01-04 MED ORDER — GLIPIZIDE 5 MG PO TABS: 5.0000 mg | ORAL_TABLET | Freq: Every day | ORAL | 3 refills | Status: DC

## 2019-01-04 NOTE — Telephone Encounter (Signed)
Pt left v/m pt said she received a my chart notice tht Dr Silvio Pate was going to send in Glipizide for increased BS. [facility] just told pt still do not have glipizide rx. Pt request cb; see 12/30/18 lab result note on. Was glipizide sent to [facility]?

## 2019-01-04 NOTE — Telephone Encounter (Signed)
I must have been distracted or taken away form my desk after I spoke to her. Sent the rx to [facility]

## 2019-02-21 ENCOUNTER — Telehealth: Payer: Self-pay

## 2019-02-21 NOTE — Telephone Encounter (Signed)
Okay Please call her in the next 2 days to monitor her status

## 2019-02-21 NOTE — Telephone Encounter (Signed)
Pt said that pts daughters coworkers have tested positive for covid. This morning at 5 AM pt started with H/A. Dry Cough,sinus drainage and pt feels bad. No fever. Pt has been around her daughter with no mask; daughter lives with pt. Pt is concerned she might have covid and wants to know if should be tested. pts daugher just found out about co workers earlier today. Advised could be tested at [facility] main visitors entrance M-F 8 AM- 3:30 PM. Pt said she does not drive and will wait for her daughters day off on 02/24/19. UC & ED precautions given and pt voiced understanding. FYI to Dr Silvio Pate. Pt is self quarantining.Pt was also given the [facility] in [place] option but also advised of cost and pt would need to ck with ins co. Pt voiced understanding.

## 2019-02-22 ENCOUNTER — Other Ambulatory Visit: Payer: Self-pay

## 2019-02-23 MED ORDER — PANTOPRAZOLE SODIUM 40 MG PO TBEC: 40.0000 mg | DELAYED_RELEASE_TABLET | Freq: Every day | ORAL | 3 refills | Status: DC

## 2019-02-23 MED ORDER — LEVOTHYROXINE SODIUM 100 MCG PO TABS: 100.0000 ug | ORAL_TABLET | Freq: Every day | ORAL | 3 refills | Status: DC

## 2019-02-24 NOTE — Telephone Encounter (Signed)
Patient called back to give update  She is no longer having sinus drainage or Cough.  No fever. She stated that now she feel perfectly fine,  patient was not sure if maybe her sugar was up and that is why she felt that bad

## 2019-02-24 NOTE — Telephone Encounter (Signed)
Left message to call office with update

## 2019-03-11 ENCOUNTER — Ambulatory Visit (INDEPENDENT_AMBULATORY_CARE_PROVIDER_SITE_OTHER): Payer: PPO | Admitting: Internal Medicine

## 2019-03-11 ENCOUNTER — Encounter: Payer: Self-pay | Admitting: Internal Medicine

## 2019-03-11 DIAGNOSIS — J069 Acute upper respiratory infection, unspecified: Secondary | ICD-10-CM

## 2019-03-11 NOTE — Progress Notes (Signed)
   Subjective:    Patient ID: Lori Phillips, female    DOB: 11/14/1955, 63 y.o.   MRN: [id number]  HPI Virtual Visit via Telephone Note  I connected with Lori Phillips on 03/11/19 at 11:00 AM EST by telephone and verified that I am speaking with the correct person using two identifiers.  Location: Patient: home Provider: office   I discussed the limitations, risks, security and privacy concerns of performing an evaluation and management service by telephone and the availability of in person appointments. I also discussed with the patient that there may be a patient responsible charge related to this service. The patient expressed understanding and agreed to proceed.   History of Present Illness: House got cold the other night---1-2 days later started feeling bad Started about 3 days ago Lots of cough Scratchy throat Somewhat better now--the cough is better Some head congestion--blowing nose Still raspy  no fever No SOB No change in smell or taste Does get sinus symptoms with weather changes---OTC meds usually help Eating fine No N/V  Daughter with same symptoms---she is going to get tested due to work requirements Observations/Objective: Voice is slightly hoarse No respiratory symptoms  Assessment and Plan: See problem list  Follow Up Instructions:    I discussed the assessment and treatment plan with the patient. The patient was provided an opportunity to ask questions and all were answered. The patient agreed with the plan and demonstrated an understanding of the instructions.   The patient was advised to call back or seek an in-person evaluation if the symptoms worsen or if the condition fails to improve as anticipated.  I provided 13 minutes of non-face-to-face time during this encounter.   Viviana Simpler, MD    Review of Systems     Objective:   Physical Exam         Assessment & Plan:

## 2019-03-11 NOTE — Assessment & Plan Note (Signed)
Sounds viral--COVID or otherwise She will try to get tested if no long lines---discussed >20% false negatives though (so stay quarantined till symptoms are gone and at least 10 days---another week). Reviewed symptomatic care If worse next week, would consider empiric amoxil for sinusitis Whether tested or not--discussed getting seen if sig dyspnea

## 2019-04-09 ENCOUNTER — Other Ambulatory Visit: Payer: Self-pay

## 2019-04-09 MED ORDER — TRULICITY 0.75 MG/0.5ML ~~LOC~~ SOAJ: SUBCUTANEOUS | 12 refills | Status: DC

## 2019-05-13 ENCOUNTER — Telehealth: Payer: Self-pay

## 2019-05-13 NOTE — Telephone Encounter (Signed)
Patient l/m stating she received 2 different notifications about when her COVID vaccine appointment is, also she thought it would be in [place]. Wanted some information/clarification. I can see appointment was made for patient for vaccine but just see 1 appointment date which is on 05/25/19 at [facility]  Left message for patient to call back

## 2019-05-17 NOTE — Telephone Encounter (Signed)
Advised pt of vaccine date and time. Pt verbalized understanding

## 2019-05-25 ENCOUNTER — Ambulatory Visit: Payer: PPO

## 2019-05-28 IMAGING — DX DG HAND 2V*R*
2 series · 2 of 2 positions shown · non-contrast
Comparison: No recent prior.

CLINICAL DATA: Bilateral hand pain.  Rheumatoid arthritis.

EXAM:
RIGHT HAND - 2 VIEW

[hand ap]
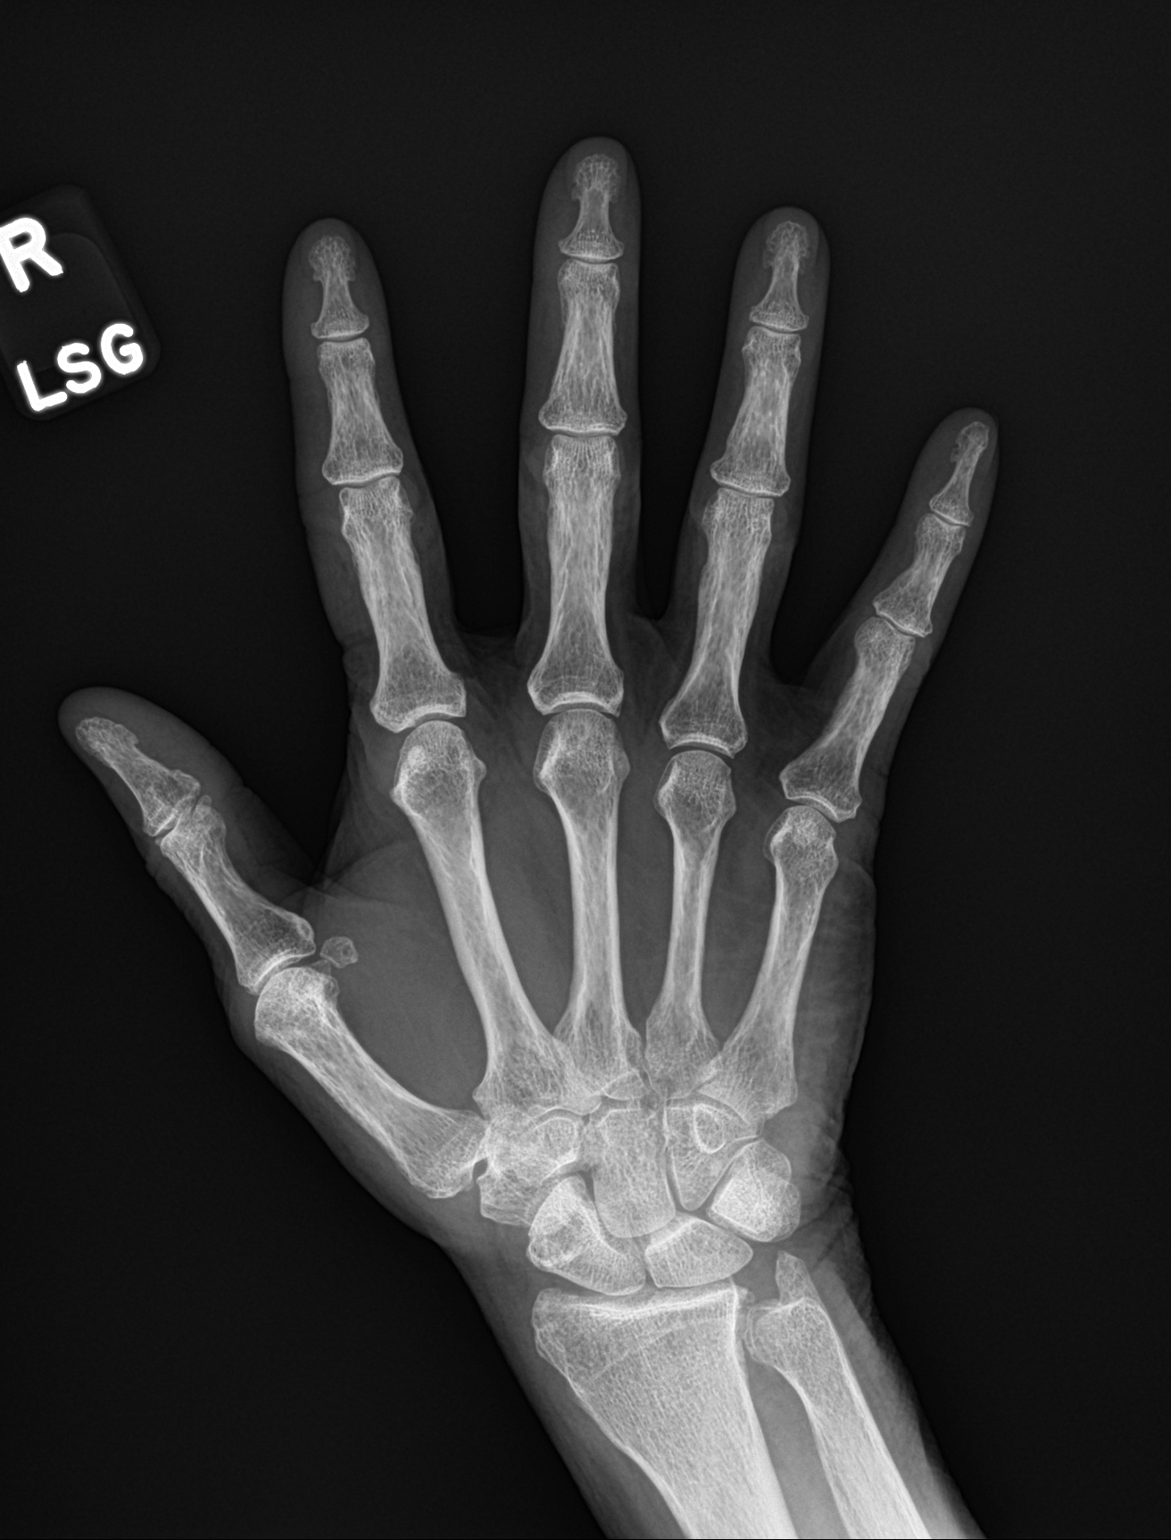

[hand lat]
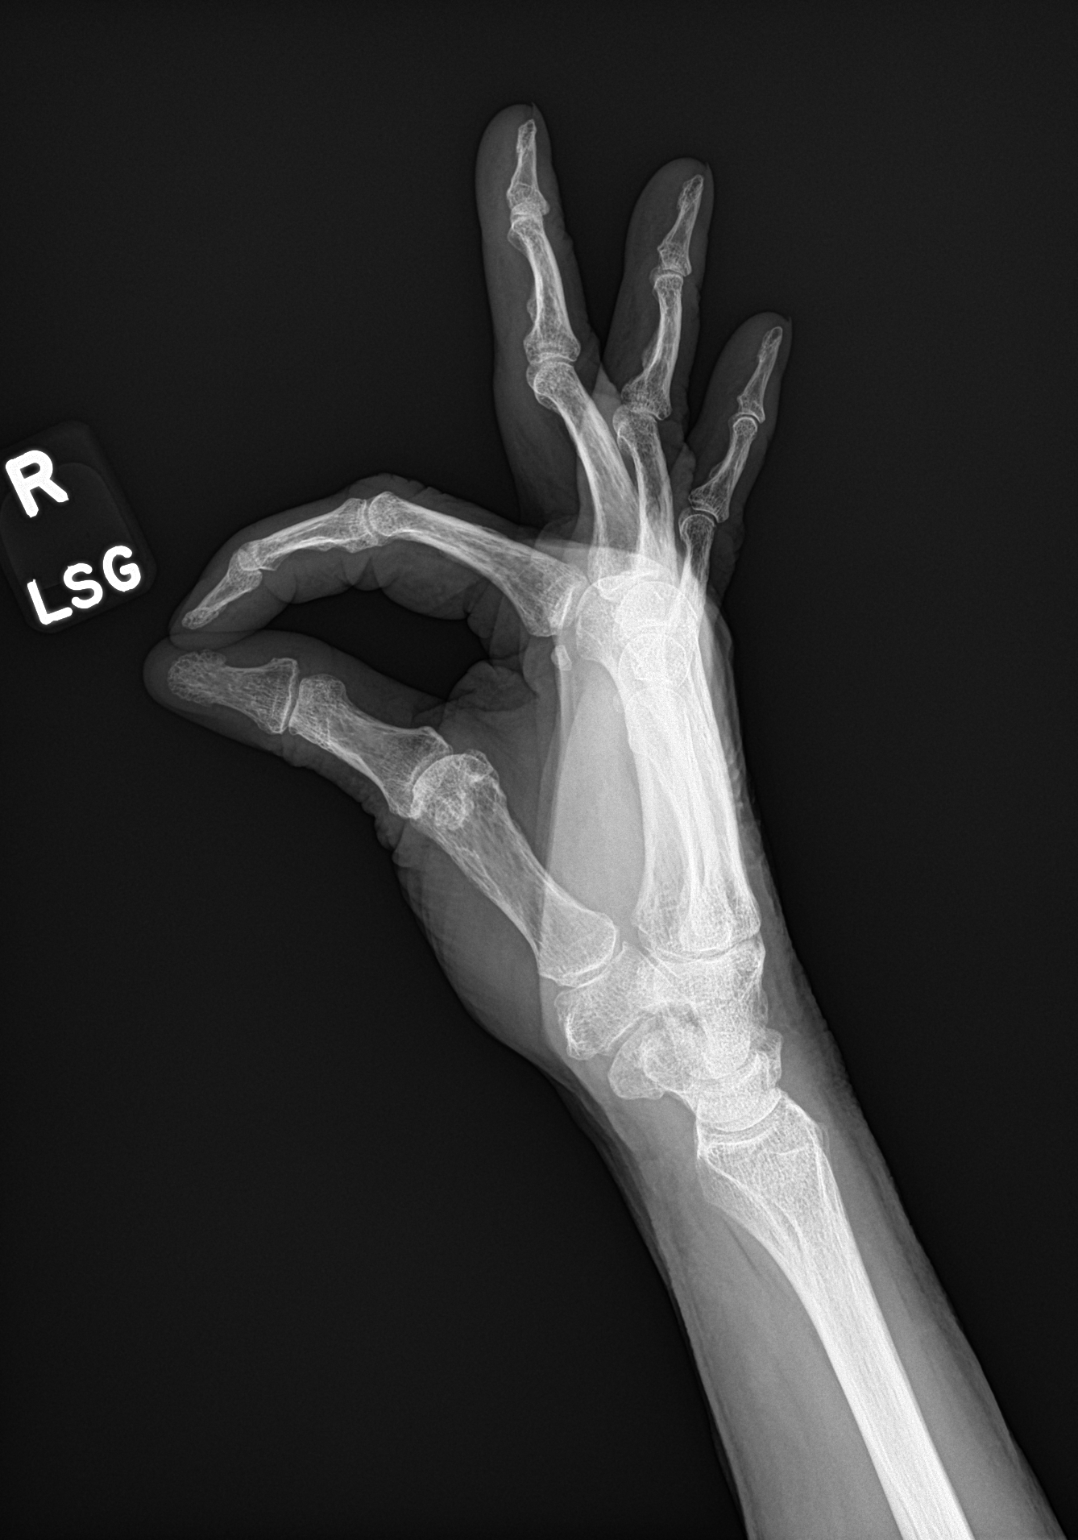

[2 of 2 positions shown; findings below may reference images not displayed]

FINDINGS: No acute soft tissue bony abnormality identified. Mild diffuse
degenerative change. No evidence of inflammatory arthropathy.
IMPRESSION: No acute abnormality. Diffuse mild degenerative change. No evidence
of inflammatory arthropathy.

## 2019-07-07 ENCOUNTER — Ambulatory Visit (INDEPENDENT_AMBULATORY_CARE_PROVIDER_SITE_OTHER): Payer: PPO | Admitting: Internal Medicine

## 2019-07-07 ENCOUNTER — Encounter: Payer: Self-pay | Admitting: Internal Medicine

## 2019-07-07 ENCOUNTER — Other Ambulatory Visit: Payer: Self-pay

## 2019-07-07 VITALS — BP 132/82 | HR 61 | Temp 97.2°F | Ht 65.0 in | Wt 165.0 lb

## 2019-07-07 DIAGNOSIS — I10 Essential (primary) hypertension: Secondary | ICD-10-CM

## 2019-07-07 DIAGNOSIS — K219 Gastro-esophageal reflux disease without esophagitis: Secondary | ICD-10-CM | POA: Insufficient documentation

## 2019-07-07 DIAGNOSIS — E039 Hypothyroidism, unspecified: Secondary | ICD-10-CM | POA: Diagnosis not present

## 2019-07-07 DIAGNOSIS — F3131 Bipolar disorder, current episode depressed, mild: Secondary | ICD-10-CM

## 2019-07-07 DIAGNOSIS — Z0000 Encounter for general adult medical examination without abnormal findings: Secondary | ICD-10-CM | POA: Diagnosis not present

## 2019-07-07 DIAGNOSIS — E1140 Type 2 diabetes mellitus with diabetic neuropathy, unspecified: Secondary | ICD-10-CM | POA: Diagnosis not present

## 2019-07-07 DIAGNOSIS — Z23 Encounter for immunization: Secondary | ICD-10-CM

## 2019-07-07 DIAGNOSIS — Z7189 Other specified counseling: Secondary | ICD-10-CM | POA: Diagnosis not present

## 2019-07-07 LAB — HM DIABETES FOOT EXAM

## 2019-07-07 NOTE — Progress Notes (Signed)
Hearing Screening   Method: Audiometry   125Hz  250Hz  500Hz  1000Hz  2000Hz  3000Hz  4000Hz  6000Hz  8000Hz   Right ear:   20 20 20  20     Left ear:   20 20 20  20       Visual Acuity Screening   Right eye Left eye Both eyes  Without correction:     With correction: 20/15 20/25 20/15

## 2019-07-07 NOTE — Patient Instructions (Addendum)
Please check with Dr Gustavo Lah about whether you need your colonoscopy now. Have your eye doctor do a dilated diabetic eye exam and send me the results. Please set up your screening mammogram Get your COVID vaccine!!!

## 2019-07-07 NOTE — Assessment & Plan Note (Signed)
See social history 

## 2019-07-07 NOTE — Progress Notes (Signed)
Subjective:    Patient ID: Lori Phillips, female    DOB: March 24, 1956, 64 y.o.   MRN: [id number]  HPI Here for Medicare wellness visit and follow up of chronic health conditions This visit occurred during the SARS-CoV-2 public health emergency.  Safety protocols were in place, including screening questions prior to the visit, additional usage of staff PPE, and extensive cleaning of exam room while observing appropriate contact time as indicated for disinfecting solutions.   Reviewed form and advanced directives Reviewed other doctors No exercise---discussed No alcohol--or [facility] No tobacco No falls Needs new glasses---will go to eye doctor when she can Hearing is fine Chronic mood problems Independent with instrumental ADLs Mild memory problems--repeats herself at times  Reviewed her slide of diabetes control She did add the glipizide Knows she hasn't been eating right Did add the glipizide Takes gabapentin--this helps feet (as no major pain now) Mild balance problems  Depression acting up --relates to isolation from [place] gotten the vaccine Not manic Crying some  No chest pain No palpitations No dizziness or syncope No edema  Current Outpatient Medications on File Prior to Visit  Medication Sig Dispense Refill  . acetaminophen (TYLENOL) 650 MG CR tablet Take 650 mg by mouth 3 (three) times daily as needed for pain.    Marland Kitchen BIOTIN PO Take 1 tablet by mouth daily.    Marland Kitchen diltiazem (CARDIZEM CD) 180 MG 24 hr capsule TAKE 1 CAPSULE(180 MG) BY MOUTH DAILY 90 capsule 3  . divalproex (DEPAKOTE ER) 500 MG 24 hr tablet TAKE 1 TABLET BY MOUTH TWICE DAILY 180 tablet 3  . gabapentin (NEURONTIN) 100 MG capsule Take 2 capsules (200 mg total) by mouth 2 (two) times daily. 360 capsule 3  . glipiZIDE (GLUCOTROL) 5 MG tablet Take 1 tablet (5 mg total) by mouth daily before breakfast. 90 tablet 3  . glucose blood (ONETOUCH VERIO) test strip Use to check blood sugar once a  day. Dx Code E11.40 100 each 12  . levothyroxine (SYNTHROID) 100 MCG tablet Take 1 tablet (100 mcg total) by mouth daily before breakfast. 90 tablet 3  . lisinopril (ZESTRIL) 5 MG tablet Take 1 tablet (5 mg total) by mouth daily. 90 tablet 3  . metFORMIN (GLUCOPHAGE) 500 MG tablet TAKE 1 TABLET(500 MG) BY MOUTH TWICE DAILY WITH A MEAL 180 tablet 3  . OneTouch Delica Lancets 99991111 MISC 1 each by Does not apply route daily. Use to obtain blood sample for blood sugar once a day. Dx Code E11.40 100 each 3  . pantoprazole (PROTONIX) 40 MG tablet Take 1 tablet (40 mg total) by mouth daily. 90 tablet 3  . rosuvastatin (CRESTOR) 20 MG tablet TAKE 1 TABLET BY MOUTH DAILY 90 tablet 3  . TRULICITY A999333 0000000 SOPN INJ 0.75 MG [place] Q 7 DAYS 4 pen 12  . vitamin B-12 (CYANOCOBALAMIN) 500 MCG tablet Take 500 mcg by mouth at bedtime.     Marland Kitchen VITAMIN D PO Take by mouth.    . [DISCONTINUED] TAZTIA XT 180 MG 24 hr capsule TAKE ONE CAPSULE BY MOUTH EVERY DAY 90 capsule 0   No current facility-administered medications on file prior to visit.    Allergies  Allergen Reactions  . Glyburide Other (See Comments)    Reaction:  Dizziness and confusion   . Seroquel [Quetiapine Fumarate] Diarrhea, Nausea And Vomiting and Other (See Comments)    Reaction:  Confusion   . Byetta 10 Mcg Pen [Exenatide] Nausea And Vomiting  Other reaction(s): Nausea And Vomiting    Past Medical History:  Diagnosis Date  . Anxiety    with panic  . Bipolar disorder ([place])   . CAD (coronary artery disease)    multiple caths  . Diabetes mellitus   . Enlarged liver   . Enlarged pituitary gland ([place])   . Fibromyalgia   . GERD (gastroesophageal reflux disease)   . Hyperlipidemia   . Hypertension   . Hypothyroidism   . Kidney stones   . Low back pain   . Nephrolithiasis   . Osteoarthritis   . Osteoporosis   . Parkinsonism ([place]) 2016   better after antipsychotics stopped  . Rheumatoid arthritis ([place])   . Scoliosis     Past  Surgical History:  Procedure Laterality Date  . BLADDER SURGERY     ?bladder sling   . CARDIAC CATHETERIZATION     x3  . CATARACT EXTRACTION, BILATERAL    . COLONOSCOPY  multiple   2005 [place] records not available-polyps x 3  . COLONOSCOPY WITH PROPOFOL N/A 10/12/2015   Procedure: COLONOSCOPY WITH PROPOFOL;  Surgeon: Lollie Sails, MD;  Location: [facility] ENDOSCOPY;  Service: Endoscopy;  Laterality: N/A;  . ESOPHAGEAL DILATION    . kidney stone removal    . TONSILLECTOMY    . VAGINAL HYSTERECTOMY      Family History  Problem Relation Age of Onset  . Hypertension Mother   . Diabetes Mother   . Bipolar disorder Father   . Hypertension Father   . Colon cancer Paternal Aunt   . Colon cancer Paternal Grandmother   . Thyroid disease Neg Hx   . Breast cancer Neg Hx     Social History   Socioeconomic History  . Marital status: Widowed    Spouse name: Not on file  . Number of children: 3  . Years of education: 71  . Highest education level: Not on file  Occupational History  . Occupation: Research officer, trade union company    Comment: disabled  Tobacco Use  . Smoking status: Never Smoker  . Smokeless tobacco: Never Used  Substance and Sexual Activity  . Alcohol use: Yes    Comment: very rare  . Drug use: No  . Sexual activity: Not on file  Other Topics Concern  . Not on file  Social History Narrative   Daughter and granddaughter live with her      Has a living will- desires CPR but no prolonged life support if futile.   Daughter Museum/gallery conservator is health care POA   No tube feeds if cognitively unaware   Social Determinants of Health   Financial Resource Strain:   . Difficulty of Paying Living Expenses:   Food Insecurity:   . Worried About Charity fundraiser in the Last Year:   . Arboriculturist in the Last Year:   Transportation Needs:   . Film/video editor (Medical):   Marland Kitchen Lack of Transportation (Non-Medical):   Physical Activity:   . Days of Exercise per  Week:   . Minutes of Exercise per Session:   Stress:   . Feeling of Stress :   Social Connections:   . Frequency of Communication with Friends and Family:   . Frequency of Social Gatherings with Friends and Family:   . Attends Religious Services:   . Active Member of Clubs or Organizations:   . Attends Archivist Meetings:   Marland Kitchen Marital Status:   Intimate Partner Violence:   .  Fear of Current or Ex-Partner:   . Emotionally Abused:   Marland Kitchen Physically Abused:   . Sexually Abused:    Review of Systems Has gained some weight Tends to go to sleep late and sleep late--discussed consistent awakening time Wears seat belt Teeth all removed--awaiting dentures No sig heartburn---rare mild symptoms (alka seltzer). No dysphagia Bowels move "too much"---urgency with coffee. No blood Some urinary incontinence--urge (wears pad) Chronic back pain---no other major joint issues. Known scoliosis and bulging discs No rash or suspicious skin lesions    Objective:   Physical Exam  Constitutional: She is oriented to person, place, and time. She appears well-developed. No distress.  HENT:  Mouth/Throat: Oropharynx is clear and moist. No oropharyngeal exudate.  edentulous  Neck: No thyromegaly present.  Cardiovascular: Normal rate, regular rhythm, normal heart sounds and intact distal pulses. Exam reveals no gallop.  No murmur heard. Respiratory: Effort normal and breath sounds normal. No respiratory distress. She has no wheezes. She has no rales.  GI: Soft.  Musculoskeletal:        General: No tenderness or edema.  Lymphadenopathy:    She has no cervical adenopathy.  Neurological: She is alert and oriented to person, place, and time.  President--- "Biden, Trump, Obama" [phone] Recall 2/3  Mild decreased sensation in plantar feet  Skin:  No foot lesions  Psychiatric:  Not overtly depressed           Assessment & Plan:

## 2019-07-07 NOTE — Addendum Note (Signed)
Addended by: Pilar Grammes on: 07/07/2019 03:21 PM   Modules accepted: Orders

## 2019-07-07 NOTE — Assessment & Plan Note (Signed)
Mostly controlled with PPI

## 2019-07-07 NOTE — Assessment & Plan Note (Signed)
No mania but increased depression--mostly reactive Continue the depakote

## 2019-07-07 NOTE — Assessment & Plan Note (Addendum)
I have personally reviewed the Medicare Annual Wellness questionnaire and have noted 1. The patient's medical and social history 2. Their use of alcohol, tobacco or illicit drugs 3. Their current medications and supplements 4. The patient's functional ability including ADL's, fall risks, home safety risks and hearing or visual             impairment. 5. Diet and physical activities 6. Evidence for depression or mood disorders  The patients weight, height, BMI and visual acuity have been recorded in the chart I have made referrals, counseling and provided education to the patient based review of the above and I have provided the pt with a written personalized care plan for preventive services.  I have provided you with a copy of your personalized plan for preventive services. Please take the time to review along with your updated medication list.  Really needs her COVID vaccine Flu vaccine in fall--daughter resists this Td if any injury Discussed outdoor time and exercise Overdue for mammogram May be due for colonoscopy---she needs to check with Dr Gustavo Lah Update pneumovax

## 2019-07-07 NOTE — Assessment & Plan Note (Signed)
Not following lifestyle---discussed If still not better--will increase trulicity and see back in 3 months Mild neuropathy--okay with gabapentin

## 2019-07-07 NOTE — Assessment & Plan Note (Signed)
BP Readings from Last 3 Encounters:  07/07/19 132/82  06/03/18 140/84  03/25/18 116/78   Acceptable control Is on the ACEI

## 2019-07-07 NOTE — Assessment & Plan Note (Signed)
Seems to be euthyroid 

## 2019-07-08 ENCOUNTER — Other Ambulatory Visit: Payer: Self-pay | Admitting: Internal Medicine

## 2019-07-08 LAB — COMPREHENSIVE METABOLIC PANEL
ALT: 9 U/L (ref 0–35)
AST: 12 U/L (ref 0–37)
Albumin: 4.6 g/dL (ref 3.5–5.2)
Alkaline Phosphatase: 71 U/L (ref 39–117)
BUN: 16 mg/dL (ref 6–23)
CO2: 27 mEq/L (ref 19–32)
Calcium: 10.4 mg/dL (ref 8.4–10.5)
Chloride: 99 mEq/L (ref 96–112)
Creatinine, Ser: 1.14 mg/dL (ref 0.40–1.20)
GFR: 47.67 mL/min — ABNORMAL LOW (ref >60.00)
Glucose, Bld: 242 mg/dL — ABNORMAL HIGH (ref 70–99)
Potassium: 4.3 mEq/L (ref 3.5–5.1)
Sodium: 137 mEq/L (ref 135–145)
Total Bilirubin: 0.4 mg/dL (ref 0.2–1.2)
Total Protein: 7.6 g/dL (ref 6.0–8.3)

## 2019-07-08 LAB — LIPID PANEL
Cholesterol: 147 mg/dL (ref 0–200)
HDL: 45.4 mg/dL (ref >39.00)
NonHDL: 101.83
Total CHOL/HDL Ratio: 3
Triglycerides: 305 mg/dL — ABNORMAL HIGH (ref 0.0–149.0)
VLDL: 61 mg/dL — ABNORMAL HIGH (ref 0.0–40.0)

## 2019-07-08 LAB — CBC
HCT: 42 % (ref 36.0–46.0)
Hemoglobin: 13.9 g/dL (ref 12.0–15.0)
MCHC: 33 g/dL (ref 30.0–36.0)
MCV: 94.9 fl (ref 78.0–100.0)
Platelets: 241 10*3/uL (ref 150.0–400.0)
RBC: 4.43 Mil/uL (ref 3.87–5.11)
RDW: 13.6 % (ref 11.5–15.5)
WBC: 8.6 10*3/uL (ref 4.0–10.5)

## 2019-07-08 LAB — VALPROIC ACID LEVEL: Valproic Acid Lvl: 95.6 mg/L (ref 50.0–100.0)

## 2019-07-08 LAB — LDL CHOLESTEROL, DIRECT: Direct LDL: 62 mg/dL

## 2019-07-08 LAB — TSH: TSH: 20.67 u[IU]/mL — ABNORMAL HIGH (ref 0.35–4.50)

## 2019-07-08 LAB — T4, FREE: Free T4: 0.93 ng/dL (ref 0.60–1.60)

## 2019-07-08 LAB — HEMOGLOBIN A1C: Hgb A1c MFr Bld: 9.5 % — ABNORMAL HIGH (ref 4.6–6.5)

## 2019-07-08 MED ORDER — DULAGLUTIDE 1.5 MG/0.5ML ~~LOC~~ SOAJ: 1.5000 mg | SUBCUTANEOUS | 3 refills | Status: DC

## 2019-07-08 NOTE — Progress Notes (Signed)
dula

## 2019-07-11 ENCOUNTER — Other Ambulatory Visit: Payer: Self-pay

## 2019-08-01 ENCOUNTER — Other Ambulatory Visit: Payer: Self-pay | Admitting: Internal Medicine

## 2019-08-01 DIAGNOSIS — I10 Essential (primary) hypertension: Secondary | ICD-10-CM

## 2019-08-22 ENCOUNTER — Other Ambulatory Visit: Payer: Self-pay | Admitting: Internal Medicine

## 2019-08-22 DIAGNOSIS — E1143 Type 2 diabetes mellitus with diabetic autonomic (poly)neuropathy: Secondary | ICD-10-CM

## 2019-08-22 DIAGNOSIS — IMO0002 Reserved for concepts with insufficient information to code with codable children: Secondary | ICD-10-CM

## 2019-10-12 ENCOUNTER — Ambulatory Visit: Payer: PPO | Admitting: Internal Medicine

## 2019-11-20 ENCOUNTER — Other Ambulatory Visit: Payer: Self-pay | Admitting: Internal Medicine

## 2020-01-03 ENCOUNTER — Telehealth: Payer: PPO | Admitting: Internal Medicine

## 2020-01-03 ENCOUNTER — Other Ambulatory Visit: Payer: Self-pay

## 2020-01-09 ENCOUNTER — Ambulatory Visit: Payer: PPO | Admitting: Internal Medicine

## 2020-01-13 ENCOUNTER — Ambulatory Visit (INDEPENDENT_AMBULATORY_CARE_PROVIDER_SITE_OTHER): Payer: PPO | Admitting: Internal Medicine

## 2020-01-13 ENCOUNTER — Encounter: Payer: Self-pay | Admitting: Internal Medicine

## 2020-01-13 ENCOUNTER — Other Ambulatory Visit: Payer: Self-pay

## 2020-01-13 VITALS — BP 150/80 | HR 55 | Temp 97.5°F | Wt 148.5 lb

## 2020-01-13 DIAGNOSIS — F3131 Bipolar disorder, current episode depressed, mild: Secondary | ICD-10-CM | POA: Diagnosis not present

## 2020-01-13 DIAGNOSIS — E1140 Type 2 diabetes mellitus with diabetic neuropathy, unspecified: Secondary | ICD-10-CM

## 2020-01-13 LAB — POCT GLYCOSYLATED HEMOGLOBIN (HGB A1C): Hemoglobin A1C: 7.4 % — AB (ref 4.0–5.6)

## 2020-01-13 MED ORDER — GABAPENTIN 100 MG PO CAPS: 100.0000 mg | ORAL_CAPSULE | Freq: Every evening | ORAL | 0 refills | Status: DC | PRN

## 2020-01-13 NOTE — Progress Notes (Signed)
Subjective:    Patient ID: Lori Phillips, female    DOB: 08/06/1955, 64 y.o.   MRN: [id number]  HPI Here for follow up of uncontrolled diabetes This visit occurred during the SARS-CoV-2 public health emergency.  Safety protocols were in place, including screening questions prior to the visit, additional usage of staff PPE, and extensive cleaning of exam room while observing appropriate contact time as indicated for disinfecting solutions.   Sugars up and down some still More "level" lately More careful with her eating now---has cut out a lot of sweets She did increase the trulicity dose Having trouble with the gabapentin---feels it is causing unsteadiness, memory problems, bowel issues, weakness Cut back on gabapentin to 1 capsule bid No recent burning or foot problems  Mood is okay Some depression  Is getting out a little more though No recent mania---"just blues" Daughter would not let her get COVID vaccine--though middle daughter has had it  No chest pain No SOB  Current Outpatient Medications on File Prior to Visit  Medication Sig Dispense Refill  . acetaminophen (TYLENOL) 650 MG CR tablet Take 650 mg by mouth 3 (three) times daily as needed for pain.    Marland Kitchen BIOTIN PO Take 1 tablet by mouth daily.    Marland Kitchen diltiazem (CARDIZEM CD) 180 MG 24 hr capsule TAKE 1 CAPSULE(180 MG) BY MOUTH DAILY 90 capsule 3  . divalproex (DEPAKOTE ER) 500 MG 24 hr tablet TAKE 1 TABLET BY MOUTH TWICE DAILY 180 tablet 3  . Dulaglutide 1.5 MG/0.5ML SOPN Inject 1.5 mg into the skin once a week. 13 pen 3  . glipiZIDE (GLUCOTROL) 5 MG tablet TAKE 1 TABLET(5 MG) BY MOUTH DAILY BEFORE BREAKFAST 90 tablet 3  . glucose blood (ONETOUCH VERIO) test strip Use to check blood sugar once a day. Dx Code E11.40 100 each 12  . levothyroxine (SYNTHROID) 100 MCG tablet Take 1 tablet (100 mcg total) by mouth daily before breakfast. 90 tablet 3  . lisinopril (ZESTRIL) 5 MG tablet TAKE 1 TABLET(5 MG) BY MOUTH DAILY 90 tablet 3    . metFORMIN (GLUCOPHAGE) 500 MG tablet TAKE 1 TABLET(500 MG) BY MOUTH TWICE DAILY WITH A MEAL 180 tablet 3  . OneTouch Delica Lancets 94H MISC 1 each by Does not apply route daily. Use to obtain blood sample for blood sugar once a day. Dx Code E11.40 100 each 3  . pantoprazole (PROTONIX) 40 MG tablet Take 1 tablet (40 mg total) by mouth daily. 90 tablet 3  . rosuvastatin (CRESTOR) 20 MG tablet TAKE 1 TABLET BY MOUTH DAILY 90 tablet 3  . vitamin B-12 (CYANOCOBALAMIN) 500 MCG tablet Take 500 mcg by mouth at bedtime.     Marland Kitchen VITAMIN D PO Take by mouth.    . [DISCONTINUED] TAZTIA XT 180 MG 24 hr capsule TAKE ONE CAPSULE BY MOUTH EVERY DAY 90 capsule 0   No current facility-administered medications on file prior to visit.    Allergies  Allergen Reactions  . Glyburide Other (See Comments)    Reaction:  Dizziness and confusion   . Seroquel [Quetiapine Fumarate] Diarrhea, Nausea And Vomiting and Other (See Comments)    Reaction:  Confusion   . Byetta 10 Mcg Pen [Exenatide] Nausea And Vomiting    Other reaction(s): Nausea And Vomiting    Past Medical History:  Diagnosis Date  . Anxiety    with panic  . Bipolar disorder ([place])   . CAD (coronary artery disease)    multiple caths  . Diabetes  mellitus   . Enlarged liver   . Enlarged pituitary gland ([place])   . Fibromyalgia   . GERD (gastroesophageal reflux disease)   . Hyperlipidemia   . Hypertension   . Hypothyroidism   . Kidney stones   . Low back pain   . Nephrolithiasis   . Osteoarthritis   . Osteoporosis   . Parkinsonism ([place]) 2016   better after antipsychotics stopped  . Rheumatoid arthritis ([place])   . Scoliosis     Past Surgical History:  Procedure Laterality Date  . BLADDER SURGERY     ?bladder sling   . CARDIAC CATHETERIZATION     x3  . CATARACT EXTRACTION, BILATERAL    . COLONOSCOPY  multiple   2005 [place] not available-polyps x 3  . COLONOSCOPY WITH PROPOFOL N/A 10/12/2015   Procedure: COLONOSCOPY WITH  PROPOFOL;  Surgeon: Lollie Sails, MD;  Location: [facility] ENDOSCOPY;  Service: Endoscopy;  Laterality: N/A;  . ESOPHAGEAL DILATION    . kidney stone removal    . TONSILLECTOMY    . VAGINAL HYSTERECTOMY      Family History  Problem Relation Age of Onset  . Hypertension Mother   . Diabetes Mother   . Bipolar disorder Father   . Hypertension Father   . Colon cancer Paternal Aunt   . Colon cancer Paternal Grandmother   . Thyroid disease Neg Hx   . Breast cancer Neg Hx     Social History   Socioeconomic History  . Marital status: Widowed    Spouse name: Not on file  . Number of children: 3  . Years of education: 13  . Highest education level: Not on file  Occupational History  . Occupation: Research officer, trade union company    Comment: disabled  Tobacco Use  . Smoking status: Never Smoker  . Smokeless tobacco: Never Used  Substance and Sexual Activity  . Alcohol use: Yes    Comment: very rare  . Drug use: No  . Sexual activity: Not on file  Other Topics Concern  . Not on file  Social History Narrative   Daughter and granddaughter live with her      Has a living will- desires CPR but no prolonged life support if futile.   Daughter Museum/gallery conservator is health care POA   No tube feeds if cognitively unaware   Social Determinants of Health   Financial Resource Strain:   . Difficulty of Paying Living Expenses: Not on file  Food Insecurity:   . Worried About Charity fundraiser in the Last Year: Not on file  . Ran Out of Food in the Last Year: Not on file  Transportation Needs:   . Lack of Transportation (Medical): Not on file  . Lack of Transportation (Non-Medical): Not on file  Physical Activity:   . Days of Exercise per Week: Not on file  . Minutes of Exercise per Session: Not on file  Stress:   . Feeling of Stress : Not on file  Social Connections:   . Frequency of Communication with Friends and Family: Not on file  . Frequency of Social Gatherings with Friends and  Family: Not on file  . Attends Religious Services: Not on file  . Active Member of Clubs or Organizations: Not on file  . Attends Archivist Meetings: Not on file  . Marital Status: Not on file  Intimate Partner Violence:   . Fear of Current or Ex-Partner: Not on file  . Emotionally Abused: Not on  file  . Physically Abused: Not on file  . Sexually Abused: Not on file   Review of Systems Has lost almost 20# Sleeping fair---takes a while to initiate    Objective:   Physical Exam Constitutional:      Appearance: Normal appearance.  Cardiovascular:     Rate and Rhythm: Normal rate and regular rhythm.     Pulses: Normal pulses.     Heart sounds: No murmur heard.  No gallop.   Pulmonary:     Effort: Pulmonary effort is normal.     Breath sounds: Normal breath sounds. No wheezing or rales.  Musculoskeletal:     Cervical back: Neck supple.     Right lower leg: No edema.     Left lower leg: No edema.  Lymphadenopathy:     Cervical: No cervical adenopathy.  Neurological:     Mental Status: She is alert.  Psychiatric:        Mood and Affect: Mood normal.        Behavior: Behavior normal.     Comments: No overt depression            Assessment & Plan:

## 2020-01-13 NOTE — Patient Instructions (Signed)
You can try cutting back the gabapentin to 1 capsule at bedtime only----and if you still don't have pain/buring in your feet---you can try stopping it.

## 2020-01-13 NOTE — Assessment & Plan Note (Signed)
Lab Results  Component Value Date   HGBA1C 7.4 (A) 01/13/2020   Much better Cut back on sweets, lost weight Helped by the increased dulaglutide (1.5), glipizide 5mg  daily and metformin 500mg  bid  Having side effects with the gabapentin--some better on lower dose Will have her try lower dose or off to see how she does

## 2020-01-13 NOTE — Assessment & Plan Note (Signed)
Worse now with some depression Caught inbetween daughters--two won't let her get vaccine, one has gotten it and she would prefer to get it (discussed going to pharmacy herself to get it) If worsens, would add bupropion 150 SR to the depakote

## 2020-02-16 ENCOUNTER — Other Ambulatory Visit: Payer: Self-pay | Admitting: Internal Medicine

## 2020-02-16 DIAGNOSIS — E1169 Type 2 diabetes mellitus with other specified complication: Secondary | ICD-10-CM

## 2020-04-09 ENCOUNTER — Telehealth: Payer: Self-pay

## 2020-04-09 NOTE — Telephone Encounter (Signed)
I spoke with Lori Phillips; pt said that she and her family are concerned that pt is having difficulty remembering what she says and then pt has to repeat herself a lot because she does not remember what she has previously said. Pt does not want to go back on gabapentin. I offered pt an appt this wk but pt said would have to be after first of year and depending on her family's schedule. Pt will ck with her family and cb for appt. FYI to DR Andree Coss CMA.

## 2020-04-10 NOTE — Telephone Encounter (Signed)
I would be most concerned about depression --but could have a primary cognitive problem. Please set her up--with family along--when she is able to get in

## 2020-04-10 NOTE — Telephone Encounter (Signed)
Spoke to pt. Made appt. 

## 2020-04-24 ENCOUNTER — Telehealth: Payer: PPO | Admitting: Internal Medicine

## 2020-05-08 ENCOUNTER — Ambulatory Visit: Payer: PPO | Admitting: Internal Medicine

## 2020-05-19 ENCOUNTER — Other Ambulatory Visit: Payer: Self-pay | Admitting: Internal Medicine

## 2020-05-23 ENCOUNTER — Encounter: Payer: Self-pay | Admitting: Internal Medicine

## 2020-05-23 ENCOUNTER — Other Ambulatory Visit: Payer: Self-pay

## 2020-05-23 ENCOUNTER — Ambulatory Visit (INDEPENDENT_AMBULATORY_CARE_PROVIDER_SITE_OTHER): Payer: PPO | Admitting: Internal Medicine

## 2020-05-23 VITALS — BP 122/78 | HR 48 | Temp 97.5°F | Ht 65.0 in | Wt 146.0 lb

## 2020-05-23 DIAGNOSIS — F3170 Bipolar disorder, currently in remission, most recent episode unspecified: Secondary | ICD-10-CM | POA: Diagnosis not present

## 2020-05-23 DIAGNOSIS — E039 Hypothyroidism, unspecified: Secondary | ICD-10-CM

## 2020-05-23 DIAGNOSIS — G3184 Mild cognitive impairment, so stated: Secondary | ICD-10-CM | POA: Insufficient documentation

## 2020-05-23 DIAGNOSIS — R413 Other amnesia: Secondary | ICD-10-CM | POA: Diagnosis not present

## 2020-05-23 HISTORY — DX: Mild cognitive impairment of uncertain or unknown etiology: G31.84

## 2020-05-23 LAB — CBC
HCT: 42 % (ref 36.0–46.0)
Hemoglobin: 13.7 g/dL (ref 12.0–15.0)
MCHC: 32.5 g/dL (ref 30.0–36.0)
MCV: 93.7 fl (ref 78.0–100.0)
Platelets: 230 10*3/uL (ref 150.0–400.0)
RBC: 4.48 Mil/uL (ref 3.87–5.11)
RDW: 13.6 % (ref 11.5–15.5)
WBC: 8.3 10*3/uL (ref 4.0–10.5)

## 2020-05-23 LAB — COMPREHENSIVE METABOLIC PANEL
ALT: 13 U/L (ref 0–35)
AST: 15 U/L (ref 0–37)
Albumin: 4.6 g/dL (ref 3.5–5.2)
Alkaline Phosphatase: 72 U/L (ref 39–117)
BUN: 24 mg/dL — ABNORMAL HIGH (ref 6–23)
CO2: 29 mEq/L (ref 19–32)
Calcium: 10.5 mg/dL (ref 8.4–10.5)
Chloride: 102 mEq/L (ref 96–112)
Creatinine, Ser: 1.21 mg/dL — ABNORMAL HIGH (ref 0.40–1.20)
GFR: 46.55 mL/min — ABNORMAL LOW (ref >60.00)
Glucose, Bld: 169 mg/dL — ABNORMAL HIGH (ref 70–99)
Potassium: 4.2 mEq/L (ref 3.5–5.1)
Sodium: 140 mEq/L (ref 135–145)
Total Bilirubin: 0.3 mg/dL (ref 0.2–1.2)
Total Protein: 7.1 g/dL (ref 6.0–8.3)

## 2020-05-23 LAB — VITAMIN B12: Vitamin B-12: >1506 pg/mL — ABNORMAL HIGH (ref 211–911)

## 2020-05-23 LAB — LIPID PANEL
Cholesterol: 129 mg/dL (ref 0–200)
HDL: 46.4 mg/dL (ref >39.00)
NonHDL: 82.54
Total CHOL/HDL Ratio: 3
Triglycerides: 250 mg/dL — ABNORMAL HIGH (ref 0.0–149.0)
VLDL: 50 mg/dL — ABNORMAL HIGH (ref 0.0–40.0)

## 2020-05-23 LAB — LDL CHOLESTEROL, DIRECT: Direct LDL: 45 mg/dL

## 2020-05-23 LAB — T4, FREE: Free T4: 1.75 ng/dL — ABNORMAL HIGH (ref 0.60–1.60)

## 2020-05-23 LAB — TSH: TSH: 3.85 u[IU]/mL (ref 0.35–4.50)

## 2020-05-23 MED ORDER — DIVALPROEX SODIUM 250 MG PO DR TAB: 250.0000 mg | DELAYED_RELEASE_TABLET | Freq: Two times a day (BID) | ORAL | 11 refills | Status: DC

## 2020-05-23 NOTE — Assessment & Plan Note (Signed)
Has been doing well---will cut the depakote dose to 250 bid

## 2020-05-23 NOTE — Progress Notes (Signed)
Subjective:    Patient ID: Lori Phillips, female    DOB: 1956-03-25, 65 y.o.   MRN: [id number]  HPI Here with daughter and granddaughter due to concerns about her memory This visit occurred during the SARS-CoV-2 public health emergency.  Safety protocols were in place, including screening questions prior to the visit, additional usage of staff PPE, and extensive cleaning of exam room while observing appropriate contact time as indicated for disinfecting solutions.   She is having memory problems They live together Daughter notes that she is telling people the same thing over and over Repeats herself---and the repeating is getting closer and closer No loss of function Rarely drives---short distances. Granddaughter notices that she gets confused about directions in the car  Some depression---holidays limited due to COVID, etc No mania Off the gabapentin over the past 4 weeks--daughter notices some improvement in memory then  Sleeps okay--but stays up later (in bed by 10 some nights---1AM other days) Appetite is variable Not checking sugars  Current Outpatient Medications on File Prior to Visit  Medication Sig Dispense Refill  . acetaminophen (TYLENOL) 650 MG CR tablet Take 650 mg by mouth 3 (three) times daily as needed for pain.    Marland Kitchen BIOTIN PO Take 1 tablet by mouth daily.    Marland Kitchen diltiazem (CARDIZEM CD) 180 MG 24 hr capsule TAKE 1 CAPSULE(180 MG) BY MOUTH DAILY 90 capsule 3  . divalproex (DEPAKOTE ER) 500 MG 24 hr tablet TAKE 1 TABLET BY MOUTH TWICE DAILY 180 tablet 3  . Dulaglutide 1.5 MG/0.5ML SOPN Inject 1.5 mg into the skin once a week. 13 pen 3  . glipiZIDE (GLUCOTROL) 5 MG tablet TAKE 1 TABLET(5 MG) BY MOUTH DAILY BEFORE BREAKFAST 90 tablet 3  . glucose blood (ONETOUCH VERIO) test strip Use to check blood sugar once a day. Dx Code E11.40 100 each 12  . levothyroxine (SYNTHROID) 100 MCG tablet TAKE 1 TABLET(100 MCG) BY MOUTH DAILY BEFORE BREAKFAST 90 tablet 0  . lisinopril  (ZESTRIL) 5 MG tablet TAKE 1 TABLET(5 MG) BY MOUTH DAILY 90 tablet 3  . metFORMIN (GLUCOPHAGE) 500 MG tablet TAKE 1 TABLET(500 MG) BY MOUTH TWICE DAILY WITH A MEAL 180 tablet 3  . OneTouch Delica Lancets 56L MISC 1 each by Does not apply route daily. Use to obtain blood sample for blood sugar once a day. Dx Code E11.40 100 each 3  . pantoprazole (PROTONIX) 40 MG tablet Take 1 tablet (40 mg total) by mouth daily. 90 tablet 3  . rosuvastatin (CRESTOR) 20 MG tablet TAKE 1 TABLET BY MOUTH DAILY 90 tablet 3  . vitamin B-12 (CYANOCOBALAMIN) 500 MCG tablet Take 500 mcg by mouth at bedtime.     Marland Kitchen VITAMIN D PO Take by mouth.    . [DISCONTINUED] TAZTIA XT 180 MG 24 hr capsule TAKE ONE CAPSULE BY MOUTH EVERY DAY 90 capsule 0   No current facility-administered medications on file prior to visit.    Allergies  Allergen Reactions  . Glyburide Other (See Comments)    Reaction:  Dizziness and confusion   . Seroquel [Quetiapine Fumarate] Diarrhea, Nausea And Vomiting and Other (See Comments)    Reaction:  Confusion   . Byetta 10 Mcg Pen [Exenatide] Nausea And Vomiting    Other reaction(s): Nausea And Vomiting    Past Medical History:  Diagnosis Date  . Anxiety    with panic  . Bipolar disorder ([place])   . CAD (coronary artery disease)    multiple caths  .  Diabetes mellitus   . Enlarged liver   . Enlarged pituitary gland ([place])   . Fibromyalgia   . GERD (gastroesophageal reflux disease)   . Hyperlipidemia   . Hypertension   . Hypothyroidism   . Kidney stones   . Low back pain   . Nephrolithiasis   . Osteoarthritis   . Osteoporosis   . Parkinsonism ([place]) 2016   better after antipsychotics stopped  . Rheumatoid arthritis ([place])   . Scoliosis     Past Surgical History:  Procedure Laterality Date  . BLADDER SURGERY     ?bladder sling   . CARDIAC CATHETERIZATION     x3  . CATARACT EXTRACTION, BILATERAL    . COLONOSCOPY  multiple   2005 [place] records not available-polyps x 3  .  COLONOSCOPY WITH PROPOFOL N/A 10/12/2015   Procedure: COLONOSCOPY WITH PROPOFOL;  Surgeon: Lollie Sails, MD;  Location: [facility] ENDOSCOPY;  Service: Endoscopy;  Laterality: N/A;  . ESOPHAGEAL DILATION    . kidney stone removal    . TONSILLECTOMY    . VAGINAL HYSTERECTOMY      Family History  Problem Relation Age of Onset  . Hypertension Mother   . Diabetes Mother   . Bipolar disorder Father   . Hypertension Father   . Colon cancer Paternal Aunt   . Colon cancer Paternal Grandmother   . Thyroid disease Neg Hx   . Breast cancer Neg Hx     Social History   Socioeconomic History  . Marital status: Widowed    Spouse name: Not on file  . Number of children: 3  . Years of education: 58  . Highest education level: Not on file  Occupational History  . Occupation: Research officer, trade union company    Comment: disabled  Tobacco Use  . Smoking status: Never Smoker  . Smokeless tobacco: Never Used  Substance and Sexual Activity  . Alcohol use: Yes    Comment: very rare  . Drug use: No  . Sexual activity: Not on file  Other Topics Concern  . Not on file  Social History Narrative   Daughter and granddaughter live with her      Has a living will- desires CPR but no prolonged life support if futile.   Daughter Museum/gallery conservator is health care POA   No tube feeds if cognitively unaware   Social Determinants of Health   Financial Resource Strain: Not on file  Food Insecurity: Not on file  Transportation Needs: Not on file  Physical Activity: Not on file  Stress: Not on file  Social Connections: Not on file  Intimate Partner Violence: Not on file   Review of Systems Weight stable Maintaining personal care No exercise No focal weakness, aphasia, facial droop etc     Objective:   Physical Exam Constitutional:      Appearance: Normal appearance.  Cardiovascular:     Rate and Rhythm: Normal rate and regular rhythm.     Heart sounds: No murmur heard. No gallop.   Pulmonary:      Effort: Pulmonary effort is normal.     Breath sounds: Normal breath sounds. No wheezing or rales.  Musculoskeletal:     Right lower leg: No edema.     Left lower leg: No edema.  Neurological:     General: No focal deficit present.     Mental Status: She is alert and oriented to person, place, and time.     Comments: President--- "Biden, Milinda Pointer, ?" [phone] [id number] Recall 3/3  Psychiatric:        Mood and Affect: Mood normal.        Behavior: Behavior normal.            Assessment & Plan:

## 2020-05-23 NOTE — Assessment & Plan Note (Signed)
TSH was high last time--will increase levothyroxine if still up

## 2020-05-23 NOTE — Patient Instructions (Signed)
Please decrease the depakote to 250mg  twice a day. You need regular physical and mental exercise to keep your mind sharp.

## 2020-05-23 NOTE — Assessment & Plan Note (Signed)
Likely multifactorial--high risk for vascular dementia Depression is not an issue--so doubt mood Will have to recheck thyroid Rare statin problems---but more likely it is part of the solution Will check MRI--if benign----will have trial off the statin Will cut the depakote--it could be affecting (seems better off the gabapentin)

## 2020-05-24 LAB — RPR: RPR Ser Ql: NONREACTIVE

## 2020-05-24 LAB — HIV ANTIBODY (ROUTINE TESTING W REFLEX): HIV 1&2 Ab, 4th Generation: NONREACTIVE

## 2020-05-24 LAB — VALPROIC ACID LEVEL: Valproic Acid Lvl: 82.5 mg/L (ref 50.0–100.0)

## 2020-06-01 ENCOUNTER — Other Ambulatory Visit: Payer: Self-pay | Admitting: Internal Medicine

## 2020-06-02 ENCOUNTER — Other Ambulatory Visit: Payer: Self-pay

## 2020-06-02 ENCOUNTER — Ambulatory Visit
Admission: RE | Admit: 2020-06-02 | Discharge: 2020-06-02 | Disposition: A | Discharge status: Home / Self Care | Payer: PPO | Source: Ambulatory Visit | Attending: Internal Medicine | Admitting: Internal Medicine

## 2020-06-02 DIAGNOSIS — G319 Degenerative disease of nervous system, unspecified: Secondary | ICD-10-CM | POA: Diagnosis not present

## 2020-06-02 DIAGNOSIS — R413 Other amnesia: Secondary | ICD-10-CM | POA: Insufficient documentation

## 2020-06-20 ENCOUNTER — Ambulatory Visit: Payer: PPO | Admitting: Internal Medicine

## 2020-07-11 ENCOUNTER — Ambulatory Visit (INDEPENDENT_AMBULATORY_CARE_PROVIDER_SITE_OTHER): Payer: Medicare HMO | Admitting: Internal Medicine

## 2020-07-11 ENCOUNTER — Other Ambulatory Visit: Payer: Self-pay

## 2020-07-11 ENCOUNTER — Encounter: Payer: Self-pay | Admitting: Internal Medicine

## 2020-07-11 VITALS — BP 118/78 | HR 51 | Temp 97.0°F | Ht 65.0 in | Wt 145.0 lb

## 2020-07-11 DIAGNOSIS — Z860100 Personal history of colon polyps, unspecified: Secondary | ICD-10-CM

## 2020-07-11 DIAGNOSIS — G3184 Mild cognitive impairment, so stated: Secondary | ICD-10-CM

## 2020-07-11 DIAGNOSIS — Z0000 Encounter for general adult medical examination without abnormal findings: Secondary | ICD-10-CM | POA: Diagnosis not present

## 2020-07-11 DIAGNOSIS — E1140 Type 2 diabetes mellitus with diabetic neuropathy, unspecified: Secondary | ICD-10-CM

## 2020-07-11 DIAGNOSIS — F3170 Bipolar disorder, currently in remission, most recent episode unspecified: Secondary | ICD-10-CM

## 2020-07-11 DIAGNOSIS — N1832 Chronic kidney disease, stage 3b: Secondary | ICD-10-CM | POA: Insufficient documentation

## 2020-07-11 DIAGNOSIS — N1831 Chronic kidney disease, stage 3a: Secondary | ICD-10-CM

## 2020-07-11 HISTORY — DX: Chronic kidney disease, stage 3b: N18.32

## 2020-07-11 LAB — POCT GLYCOSYLATED HEMOGLOBIN (HGB A1C): Hemoglobin A1C: 7.4 % — AB (ref 4.0–5.6)

## 2020-07-11 LAB — HM DIABETES FOOT EXAM

## 2020-07-11 MED ORDER — ONETOUCH VERIO VI STRP: ORAL_STRIP | 4 refills | Status: DC

## 2020-07-11 NOTE — Assessment & Plan Note (Signed)
MRI shows atrophy Is on statin Some improvement with mental stimulation and more social engagement

## 2020-07-11 NOTE — Progress Notes (Signed)
Subjective:    Patient ID: MIIA BLANKS, female    DOB: 11/25/1955, 65 y.o.   MRN: [id number]  HPI  Here with daughter for Medicare wellness visit and follow up of chronic health conditions This visit occurred during the SARS-CoV-2 public health emergency.  Safety protocols were in place, including screening questions prior to the visit, additional usage of staff PPE, and extensive cleaning of exam room while observing appropriate contact time as indicated for disinfecting solutions.   Reviewed advanced directives Reviewed other doctors----Dr Nillissen--dentist at [facility], Dr Shawn Route No hospitalizations or surgery in the past year No tobacco or alcohol Trying to be more active--but no set exercise Vision is an issue---overdue for eye exam Hearing is okay No falls Mood has been okay Daughter brings her shopping---does her own instrumental ADLs. Daughter and granddaughter live with her Memory problems are some better  Daughter thinks she is still a bit foggy---but remembering better Has started word puzzles She feels she is some better Not really exercises--but getting out more (walking in mall, etc)  No mania or depression on lower dose of depakote  Checks sugars infrequently--out of strips Nothing recently Neuropathy is not worse---even off the gabapentin  No chest pain or SOB No dizziness or syncope No edema--but has some discoloration No palpitations  Reviewed labs GFR 46  Current Outpatient Medications on File Prior to Visit  Medication Sig Dispense Refill  . acetaminophen (TYLENOL) 650 MG CR tablet Take 650 mg by mouth 3 (three) times daily as needed for pain.    Marland Kitchen diltiazem (CARDIZEM CD) 180 MG 24 hr capsule TAKE 1 CAPSULE(180 MG) BY MOUTH DAILY 90 capsule 3  . divalproex (DEPAKOTE) 250 MG DR tablet Take 1 tablet (250 mg total) by mouth 2 (two) times daily. 60 tablet 11  . Dulaglutide 1.5 MG/0.5ML SOPN Inject 1.5 mg into the skin once a week. 13 pen 3  .  glipiZIDE (GLUCOTROL) 5 MG tablet TAKE 1 TABLET(5 MG) BY MOUTH DAILY BEFORE BREAKFAST 90 tablet 3  . glucose blood (ONETOUCH VERIO) test strip Use to check blood sugar once a day. Dx Code E11.40 100 each 12  . levothyroxine (SYNTHROID) 100 MCG tablet TAKE 1 TABLET(100 MCG) BY MOUTH DAILY BEFORE BREAKFAST 90 tablet 0  . lisinopril (ZESTRIL) 5 MG tablet TAKE 1 TABLET(5 MG) BY MOUTH DAILY 90 tablet 3  . metFORMIN (GLUCOPHAGE) 500 MG tablet TAKE 1 TABLET(500 MG) BY MOUTH TWICE DAILY WITH A MEAL 180 tablet 3  . OneTouch Delica Lancets 90W MISC 1 each by Does not apply route daily. Use to obtain blood sample for blood sugar once a day. Dx Code E11.40 100 each 3  . pantoprazole (PROTONIX) 40 MG tablet TAKE 1 TABLET(40 MG) BY MOUTH DAILY 90 tablet 3  . rosuvastatin (CRESTOR) 20 MG tablet TAKE 1 TABLET BY MOUTH DAILY 90 tablet 3  . vitamin B-12 (CYANOCOBALAMIN) 500 MCG tablet Take 500 mcg by mouth at bedtime.     Marland Kitchen VITAMIN D PO Take by mouth.    . [DISCONTINUED] TAZTIA XT 180 MG 24 hr capsule TAKE ONE CAPSULE BY MOUTH EVERY DAY 90 capsule 0   No current facility-administered medications on file prior to visit.    Allergies  Allergen Reactions  . Glyburide Other (See Comments)    Reaction:  Dizziness and confusion   . Seroquel [Quetiapine Fumarate] Diarrhea, Nausea And Vomiting and Other (See Comments)    Reaction:  Confusion   . Byetta 10 Mcg Pen [Exenatide] Nausea And  Vomiting    Other reaction(s): Nausea And Vomiting    Past Medical History:  Diagnosis Date  . Anxiety    with panic  . Bipolar disorder ([place])   . CAD (coronary artery disease)    multiple caths  . Diabetes mellitus   . Enlarged liver   . Enlarged pituitary gland ([place])   . Fibromyalgia   . GERD (gastroesophageal reflux disease)   . Hyperlipidemia   . Hypertension   . Hypothyroidism   . Kidney stones   . Low back pain   . Nephrolithiasis   . Osteoarthritis   . Osteoporosis   . Parkinsonism ([place]) 2016   better after  antipsychotics stopped  . Rheumatoid arthritis ([place])   . Scoliosis     Past Surgical History:  Procedure Laterality Date  . BLADDER SURGERY     ?bladder sling   . CARDIAC CATHETERIZATION     x3  . CATARACT EXTRACTION, BILATERAL    . COLONOSCOPY  multiple   2005 [place] records not available-polyps x 3  . COLONOSCOPY WITH PROPOFOL N/A 10/12/2015   Procedure: COLONOSCOPY WITH PROPOFOL;  Surgeon: Lollie Sails, MD;  Location: [facility] ENDOSCOPY;  Service: Endoscopy;  Laterality: N/A;  . ESOPHAGEAL DILATION    . kidney stone removal    . TONSILLECTOMY    . VAGINAL HYSTERECTOMY      Family History  Problem Relation Age of Onset  . Hypertension Mother   . Diabetes Mother   . Bipolar disorder Father   . Hypertension Father   . Colon cancer Paternal Aunt   . Colon cancer Paternal Grandmother   . Thyroid disease Neg Hx   . Breast cancer Neg Hx     Social History   Socioeconomic History  . Marital status: Widowed    Spouse name: Not on file  . Number of children: 3  . Years of education: 49  . Highest education level: Not on file  Occupational History  . Occupation: Research officer, trade union company    Comment: disabled  Tobacco Use  . Smoking status: Never Smoker  . Smokeless tobacco: Never Used  Substance and Sexual Activity  . Alcohol use: Yes    Comment: very rare  . Drug use: No  . Sexual activity: Not on file  Other Topics Concern  . Not on file  Social History Narrative   Daughter and granddaughter live with her      Has a living will- desires CPR but no prolonged life support if futile.   Daughter Museum/gallery conservator is health care POA   No tube feeds if cognitively unaware   Social Determinants of Health   Financial Resource Strain: Not on file  Food Insecurity: Not on file  Transportation Needs: Not on file  Physical Activity: Not on file  Stress: Not on file  Social Connections: Not on file  Intimate Partner Violence: Not on file   Review of  Systems Some post nasal drip---allergies (cats). Has been taking benedryl---discussed changing to claritin or zyrtec Appetite is okay Weight is stable Sleeps okay Needs dentures Wears seat belt No heartburn with daily protonix ---no dysphagia Bowels are runny--?from alka seltzer. No blood Voids okay----some urge incontinence (better with regular voids) No suspicious skin lesions Some back pain---if prolonged standing. Better if sits.     Objective:   Physical Exam Constitutional:      Appearance: Normal appearance.  HENT:     Mouth/Throat:     Pharynx: No oropharyngeal exudate or posterior oropharyngeal  erythema.     Comments: edentulous Eyes:     Extraocular Movements: Extraocular movements intact.     Pupils: Pupils are equal, round, and reactive to light.  Cardiovascular:     Rate and Rhythm: Normal rate and regular rhythm.     Pulses: Normal pulses.     Heart sounds: No murmur heard. No gallop.   Pulmonary:     Effort: Pulmonary effort is normal.     Breath sounds: Normal breath sounds. No wheezing or rales.  Abdominal:     Palpations: Abdomen is soft.     Tenderness: There is no abdominal tenderness.  Musculoskeletal:     Cervical back: Neck supple.     Right lower leg: No edema.     Left lower leg: No edema.  Lymphadenopathy:     Cervical: No cervical adenopathy.  Skin:    Findings: No rash.     Comments: No foot lesions  Neurological:     General: No focal deficit present.     Mental Status: She is alert.     Comments: Slightly decreased sensation in feet  Psychiatric:        Mood and Affect: Mood normal.        Behavior: Behavior normal.            Assessment & Plan:

## 2020-07-11 NOTE — Patient Instructions (Addendum)
Please try to cut back and only take the acid blocker, pantoprazole, 5-6 days a week (or even just every other day if you don't have symptoms come back)  Please set up your screening mammogram and see your eye doctor for a diabetes eye exam!!

## 2020-07-11 NOTE — Assessment & Plan Note (Signed)
Is on lisinopril Will recheck next time

## 2020-07-11 NOTE — Progress Notes (Signed)
Hearing Screening   Method: Audiometry   125Hz  250Hz  500Hz  1000Hz  2000Hz  3000Hz  4000Hz  6000Hz  8000Hz   Right ear:   20 20 20  20     Left ear:   20 20 20  20       Visual Acuity Screening   Right eye Left eye Both eyes  Without correction:     With correction: 20/13 20/13 20/13

## 2020-07-11 NOTE — Assessment & Plan Note (Signed)
Due for colon

## 2020-07-11 NOTE — Addendum Note (Signed)
Addended by: Pilar Grammes on: 07/11/2020 10:06 AM   Modules accepted: Orders

## 2020-07-11 NOTE — Assessment & Plan Note (Addendum)
Hopefully still okay with dulaglutide, glipizide, metformin Neuropathy okay off the gabapentin  Lab Results  Component Value Date   HGBA1C 7.4 (A) 07/11/2020   Still good control No med changes needed

## 2020-07-11 NOTE — Assessment & Plan Note (Signed)
I have personally reviewed the Medicare Annual Wellness questionnaire and have noted 1. The patient's medical and social history 2. Their use of alcohol, tobacco or illicit drugs 3. Their current medications and supplements 4. The patient's functional ability including ADL's, fall risks, home safety risks and hearing or visual             impairment. 5. Diet and physical activities 6. Evidence for depression or mood disorders  The patients weight, height, BMI and visual acuity have been recorded in the chart I have made referrals, counseling and provided education to the patient based review of the above and I have provided the pt with a written personalized care plan for preventive services.  I have provided you with a copy of your personalized plan for preventive services. Please take the time to review along with your updated medication list.  Prefers no COVID vaccine Flu vaccine in fall if willing Td if any injury Due for colonoscopy Overdue for [facility] her to set up

## 2020-07-11 NOTE — Assessment & Plan Note (Signed)
No mania or depression on the lower depakote dose

## 2020-08-09 ENCOUNTER — Other Ambulatory Visit: Payer: Self-pay | Admitting: Internal Medicine

## 2020-08-22 ENCOUNTER — Other Ambulatory Visit: Payer: Self-pay | Admitting: Internal Medicine

## 2020-08-22 DIAGNOSIS — IMO0002 Reserved for concepts with insufficient information to code with codable children: Secondary | ICD-10-CM

## 2020-08-22 DIAGNOSIS — E1143 Type 2 diabetes mellitus with diabetic autonomic (poly)neuropathy: Secondary | ICD-10-CM

## 2020-08-22 DIAGNOSIS — I10 Essential (primary) hypertension: Secondary | ICD-10-CM

## 2020-08-25 ENCOUNTER — Other Ambulatory Visit: Payer: Self-pay | Admitting: Internal Medicine

## 2020-08-29 ENCOUNTER — Telehealth: Payer: Self-pay | Admitting: Internal Medicine

## 2020-08-29 NOTE — Telephone Encounter (Signed)
Spoke to pt. Advised her that we sent a rx for the 250mg  to [facility] 05-23-20. Advised her to clear this up with the pharmacy.

## 2020-08-29 NOTE — Telephone Encounter (Signed)
Mrs. Langstaff called in Dr. Silvio Pate changed her Depakote to 250mg  but when she got it filled they filled the 500mg  and wanted to know if Larene Beach can call and fix the issue.

## 2020-09-27 ENCOUNTER — Other Ambulatory Visit: Payer: Self-pay | Admitting: Internal Medicine

## 2020-09-27 DIAGNOSIS — Z1231 Encounter for screening mammogram for malignant neoplasm of breast: Secondary | ICD-10-CM

## 2020-10-03 ENCOUNTER — Ambulatory Visit
Admission: RE | Admit: 2020-10-03 | Discharge: 2020-10-03 | Disposition: A | Discharge status: Home / Self Care | Payer: Medicare HMO | Source: Ambulatory Visit | Attending: Internal Medicine | Admitting: Internal Medicine

## 2020-10-03 ENCOUNTER — Other Ambulatory Visit: Payer: Self-pay

## 2020-10-03 DIAGNOSIS — Z1231 Encounter for screening mammogram for malignant neoplasm of breast: Secondary | ICD-10-CM | POA: Insufficient documentation

## 2020-10-17 ENCOUNTER — Telehealth: Payer: Self-pay | Admitting: Internal Medicine

## 2020-10-17 NOTE — Telephone Encounter (Signed)
Pt dropped off parking placard form to be filled out. Placed in inbox.

## 2020-10-18 DIAGNOSIS — K5909 Other constipation: Secondary | ICD-10-CM | POA: Diagnosis not present

## 2020-10-18 DIAGNOSIS — K219 Gastro-esophageal reflux disease without esophagitis: Secondary | ICD-10-CM | POA: Diagnosis not present

## 2020-10-18 DIAGNOSIS — Z86010 Personal history of colonic polyps: Secondary | ICD-10-CM | POA: Diagnosis not present

## 2020-10-18 NOTE — Telephone Encounter (Signed)
Form placed in Dr Letvak's inbox on his desk 

## 2020-11-19 ENCOUNTER — Telehealth: Payer: Self-pay | Admitting: *Deleted

## 2020-11-19 NOTE — Telephone Encounter (Signed)
Spoke to pt. Advised her to call her county DSS office.

## 2020-11-19 NOTE — Telephone Encounter (Signed)
Patient left a voicemail stating that her daughter is her caregiver and does everything for her. Patient wants to know if anything can be done so that her daughter can get paid for caring for her.

## 2020-11-20 ENCOUNTER — Other Ambulatory Visit: Payer: Self-pay | Admitting: Internal Medicine

## 2020-11-22 ENCOUNTER — Telehealth: Payer: Self-pay | Admitting: *Deleted

## 2020-11-22 NOTE — Telephone Encounter (Signed)
Ms. Pfuhl left voicemail on triage stating that her daughter is positive for Covid and probably her granddaughter as well.  She currently lives with them and is now having a running nose.  She is asking if she needs to get tested.  I returned patient's call to get more information and got her voicemail.  Left message for Ms. Stitt to return call to office.

## 2020-11-22 NOTE — Telephone Encounter (Signed)
See note below access nurse where pt was left v/m requesting cb to [facility]. Sending note to [facility] triage, Dr Silvio Pate and Larene Beach CMA.

## 2020-11-22 NOTE — Telephone Encounter (Signed)
[place] Day - Client TELEPHONE ADVICE RECORD AccessNurse Patient Name: Lori Phillips Gender: Female DOB: Jun 27, 1955 Age: 65 Y 17 M 20 D Return Phone Number: [phone] (Primary), [phone] (Secondary) Address: [address] City/ State/ Zip: [place] [address] Client [facility] Day - Client Client Site [place] - Day Physician Viviana Simpler- MD Contact Type Call Who Is Calling Patient / Member / Family / Caregiver Call Type Triage / Clinical Relationship To Patient Self Return Phone Number [phone] (Primary) Chief Complaint Headache Reason for Call Symptomatic / Request for Health Information Initial Comment Has been exposed to covid. Feels weak, headache, runny nose and sneezing Translation No Nurse Assessment Nurse: Hassell Done, RN, Melanie Date/Time (Eastern Time): 11/22/2020 3:06:59 PM Confirm and document reason for call. If symptomatic, describe symptoms. ---Caller states she felt real bad a week ago and now still not up to her normal self. No fever. she has sneezing and runny nose and has a headache and feels weak. She has continued to feel bad for over a week. Does the patient have any new or worsening symptoms? ---Yes Will a triage be completed? ---Yes Related visit to physician within the last 2 weeks? ---No Does the PT have any chronic conditions? (i.e. diabetes, asthma, this includes High risk factors for pregnancy, etc.) ---Yes List chronic conditions. ---diabetes Is this a behavioral health or substance abuse call? ---No Guidelines Guideline Title Affirmed Question Affirmed Notes Nurse Date/Time (Eastern Time) COVID-19 - Diagnosed or Suspected [1] COVID-19 infection suspected by caller or triager AND [2] mild symptoms (cough, fever, or others) AND [3] negative COVID-19 rapid test Arnaldo Natal 11/22/2020 3:10:56 PM PLEASE NOTE: All  timestamps contained within this report are represented as [place] Standard Time. CONFIDENTIALTY NOTICE: This fax transmission is intended only for the addressee. It contains information that is legally privileged, confidential or otherwise protected from use or disclosure. If you are not the intended recipient, you are strictly prohibited from reviewing, disclosing, copying using or disseminating any of this information or taking any action in reliance on or regarding this information. If you have received this fax in error, please notify us immediately by telephone so that we can arrange for its return to [place]. Phone: [phone], Toll-Free: [phone], Fax: [phone] Page: 2 of 2 Call Id: [id number] [place]. Time Eilene Ghazi Time) Disposition Final User 11/22/2020 2:52:17 PM Attempt made - message left Arnaldo Natal 11/22/2020 2:53:05 PM Attempt made - no message left Arnaldo Natal 11/22/2020 3:21:10 PM Call PCP when Office is Open Yes Hassell Done, RN, Donnajean Lopes Disagree/Comply Comply Caller Understands Yes PreDisposition Call Doctor Care Advice Given Per Guideline CALL PCP WHEN OFFICE IS OPEN: * You need to discuss this with your doctor (or NP/PA) within the next few days. * Call the office when it is open. ALTERNATE DISPOSITION - LOCAL CLINIC OR URGENT CARE CENTER: * Positive rapid test results are accurate and can be trusted. Negative rapid test results are usually accurate, but can sometimes be wrong. REASSURANCE AND EDUCATION - SUSPECTED COVID-19 AND NEGATIVE RAPID COVID-19 TEST: * An error is more likely with tests performed at home. Rapid tests performed at a test site are usually more accurate. REPEATING A COVID-19 VIRAL TEST: * NEGATIVE VIRAL TEST: A repeat test is sometimes needed after a negative viral test. Reason: A test may be incorrectly negative. For example, if a person gets the test too soon after exposure or does not swab the  nose the right way. Further, if a person is  exposed again or develops symptoms suggestive of COVID-19, then repeat viral testing should be performed. Home self-tests may recommend repeat testing after 1 to 2 days if the first test is negative. GENERAL CARE ADVICE FOR COVID-19 SYMPTOMS: * Feeling dehydrated: Drink extra liquids. If the air in your home is dry, use a humidifier. * Fever: For fever over 101 F (38.3 C), take acetaminophen every 4 to 6 hours (Adults 650 mg) OR ibuprofen every 6 to 8 hours (Adults 400 mg). Before taking any medicine, read all the instructions on the package. Do not take aspirin unless your doctor has prescribed it for you. HUMIDIFIER: * If the air is dry, use a humidifier in the bedroom. COUGHING SPELLS: COVID-19 - HOW TO PROTECT OTHERS - WHEN YOU ARE SICK WITH COVID-19: CALL BACK IF: * Fever over 103 F (39.4 C) * Chest pain or difficulty breathing occurs * You become worse CARE ADVICE given per COVID-19 - DIAGNOSED OR SUSPECTED (Adult) guideline.

## 2020-11-23 NOTE — Telephone Encounter (Signed)
Spoke to pt. She said Access Nurse was telling her she may not have Covid or had Covid last week. She said she is feeling better this morning. Does not think she needs anything right now.

## 2021-01-16 ENCOUNTER — Ambulatory Visit (INDEPENDENT_AMBULATORY_CARE_PROVIDER_SITE_OTHER): Payer: Medicare HMO | Admitting: Internal Medicine

## 2021-01-16 ENCOUNTER — Other Ambulatory Visit: Payer: Self-pay

## 2021-01-16 ENCOUNTER — Encounter: Payer: Self-pay | Admitting: Internal Medicine

## 2021-01-16 VITALS — BP 134/78 | HR 52 | Temp 97.2°F | Ht 64.0 in | Wt 137.0 lb

## 2021-01-16 DIAGNOSIS — G3184 Mild cognitive impairment, so stated: Secondary | ICD-10-CM | POA: Diagnosis not present

## 2021-01-16 DIAGNOSIS — E1140 Type 2 diabetes mellitus with diabetic neuropathy, unspecified: Secondary | ICD-10-CM

## 2021-01-16 DIAGNOSIS — F310 Bipolar disorder, current episode hypomanic: Secondary | ICD-10-CM

## 2021-01-16 LAB — POCT GLYCOSYLATED HEMOGLOBIN (HGB A1C): Hemoglobin A1C: 6.7 % — AB (ref 4.0–5.6)

## 2021-01-16 MED ORDER — TRULICITY 0.75 MG/0.5ML ~~LOC~~ SOAJ: 0.7500 mg | SUBCUTANEOUS | 3 refills | Status: DC

## 2021-01-16 MED ORDER — DIVALPROEX SODIUM 500 MG PO DR TAB: 500.0000 mg | DELAYED_RELEASE_TABLET | Freq: Two times a day (BID) | ORAL | 3 refills | Status: DC

## 2021-01-16 NOTE — Patient Instructions (Signed)
Please add 1 or 2 loratadine (claritin) to the zyrtec daily

## 2021-01-16 NOTE — Assessment & Plan Note (Signed)
Lab Results  Component Value Date   HGBA1C 6.7 (A) 01/16/2021   Very good control but trulicity too much--will cut back to 0.75 weekly Glipizide and metformin

## 2021-01-16 NOTE — Progress Notes (Signed)
Subjective:    Patient ID: Lori Phillips, female    DOB: 07-27-1955, 65 y.o.   MRN: [id number]  HPI Here with daughter for follow up of diabetes and other chronic health conditions This visit occurred during the SARS-CoV-2 public health emergency.  Safety protocols were in place, including screening questions prior to the visit, additional usage of staff PPE, and extensive cleaning of exam room while observing appropriate contact time as indicated for disinfecting solutions.   She feels her memory is worse Forgets things she just asked, etc Worse if tired No change in function---but not doing word searches, etc Not much social engagement--no church  Eating less Weight down Not checking sugars--still needs new meter  No chest pain No SOB--just some allergy issues (rhinorrhea)  Emotions flare a bit--since depakote dose lowered  Current Outpatient Medications on File Prior to Visit  Medication Sig Dispense Refill   acetaminophen (TYLENOL) 650 MG CR tablet Take 650 mg by mouth 3 (three) times daily as needed for pain.     diltiazem (CARDIZEM CD) 180 MG 24 hr capsule TAKE 1 CAPSULE(180 MG) BY MOUTH DAILY 90 capsule 3   divalproex (DEPAKOTE) 250 MG DR tablet Take 1 tablet (250 mg total) by mouth 2 (two) times daily. 60 tablet 11   glipiZIDE (GLUCOTROL) 5 MG tablet TAKE 1 TABLET(5 MG) BY MOUTH DAILY BEFORE BREAKFAST 90 tablet 3   glucose blood (ONETOUCH VERIO) test strip Use to check blood sugar once a day. Dx Code E11.40 100 each 4   levothyroxine (SYNTHROID) 100 MCG tablet TAKE 1 TABLET(100 MCG) BY MOUTH DAILY BEFORE BREAKFAST 90 tablet 3   lisinopril (ZESTRIL) 5 MG tablet TAKE 1 TABLET(5 MG) BY MOUTH DAILY 90 tablet 3   metFORMIN (GLUCOPHAGE) 500 MG tablet TAKE 1 TABLET(500 MG) BY MOUTH TWICE DAILY WITH A MEAL 180 tablet 3   OneTouch Delica Lancets 27P MISC 1 each by Does not apply route daily. Use to obtain blood sample for blood sugar once a day. Dx Code E11.40 100 each 3    pantoprazole (PROTONIX) 40 MG tablet TAKE 1 TABLET(40 MG) BY MOUTH DAILY 90 tablet 3   rosuvastatin (CRESTOR) 20 MG tablet TAKE 1 TABLET BY MOUTH DAILY 90 tablet 3   TRULICITY 1.5 OE/4.2PN SOPN ADMINISTER 1.5 MG UNDER THE SKIN 1 TIME A WEEK 6 mL 5   vitamin B-12 (CYANOCOBALAMIN) 500 MCG tablet Take 500 mcg by mouth at bedtime.      VITAMIN D PO Take by mouth.     [DISCONTINUED] TAZTIA XT 180 MG 24 hr capsule TAKE ONE CAPSULE BY MOUTH EVERY DAY 90 capsule 0   No current facility-administered medications on file prior to visit.    Allergies  Allergen Reactions   Glyburide Other (See Comments)    Reaction:  Dizziness and confusion    Seroquel [Quetiapine Fumarate] Diarrhea, Nausea And Vomiting and Other (See Comments)    Reaction:  Confusion    Byetta 10 Mcg Pen [Exenatide] Nausea And Vomiting    Other reaction(s): Nausea And Vomiting    Past Medical History:  Diagnosis Date   Anxiety    with panic   Bipolar disorder ([place])    CAD (coronary artery disease)    multiple caths   Diabetes mellitus    Enlarged liver    Enlarged pituitary gland (HCC)    Fibromyalgia    GERD (gastroesophageal reflux disease)    Hyperlipidemia    Hypertension    Hypothyroidism    Kidney stones  Low back pain    Nephrolithiasis    Osteoarthritis    Osteoporosis    Parkinsonism ([place]) 2016   better after antipsychotics stopped   Rheumatoid arthritis ([place])    Scoliosis     Past Surgical History:  Procedure Laterality Date   BLADDER SURGERY     ?bladder sling    CARDIAC CATHETERIZATION     x3   CATARACT EXTRACTION, BILATERAL     COLONOSCOPY  multiple   2005 [place] records not available-polyps x 3   COLONOSCOPY WITH PROPOFOL N/A 10/12/2015   Procedure: COLONOSCOPY WITH PROPOFOL;  Surgeon: Lollie Sails, MD;  Location: [facility] END[facility]COPY;  Service: Endoscopy;  Laterality: N/A;   ESOPHAGEAL DILATION     kidney stone removal     TONSILLECTOMY     VAGINAL HYSTERECTOMY      Family  History  Problem Relation Age of Onset   Hypertension Mother    Diabetes Mother    Bipolar disorder Father    Hypertension Father    Colon cancer Paternal Aunt    Colon cancer Paternal Grandmother    Thyroid disease Neg Hx    Breast cancer Neg Hx     Social History   Socioeconomic History   Marital status: Widowed    Spouse name: Not on file   Number of children: 3   Years of education: 12   Highest education level: Not on file  Occupational History   Occupation: [employer]: disabled  Tobacco Use   Smoking status: Never   Smokeless tobacco: Never  Substance and Sexual Activity   Alcohol use: Yes    Comment: very rare   Drug use: No   Sexual activity: Not on file  Other Topics Concern   Not on file  Social History Narrative   Daughter and granddaughter live with her      Has a living will- desires CPR but no prolonged life support if futile.   Daughter Museum/gallery conservator is health care POA   No tube feeds if cognitively unaware   Social Determinants of Health   Financial Resource Strain: Not on file  Food Insecurity: Not on file  Transportation Needs: Not on file  Physical Activity: Not on file  Stress: Not on file  Social Connections: Not on file  Intimate Partner Violence: Not on file   Review of Systems Has spot on back to be checked Sleeping okay     Objective:   Physical Exam Constitutional:      Appearance: Normal appearance.  Cardiovascular:     Rate and Rhythm: Normal rate and regular rhythm.     Pulses: Normal pulses.     Heart sounds: No murmur heard.   No gallop.  Pulmonary:     Effort: Pulmonary effort is normal.     Breath sounds: Normal breath sounds. No wheezing or rales.  Musculoskeletal:     Cervical back: Neck supple.     Right lower leg: No edema.     Left lower leg: No edema.  Lymphadenopathy:     Cervical: No cervical adenopathy.  Skin:    Comments: Seb keratoses on back  Neurological:     Mental  Status: She is alert.  Psychiatric:        Mood and Affect: Mood normal.        Behavior: Behavior normal.           Assessment & Plan:

## 2021-01-16 NOTE — Assessment & Plan Note (Signed)
Again discussed exercise and social engagement

## 2021-01-16 NOTE — Assessment & Plan Note (Signed)
More irritable lately Will increase the depakote back to 500 bid

## 2021-01-17 ENCOUNTER — Encounter: Payer: Self-pay | Admitting: Internal Medicine

## 2021-01-29 ENCOUNTER — Telehealth: Payer: Self-pay

## 2021-01-29 NOTE — Telephone Encounter (Signed)
Pt wants the letter mailed

## 2021-01-29 NOTE — Telephone Encounter (Signed)
Left message for pt that we have the jury duty letter ready and we have the summons. Does she want [place] to mail it or she will come get it.

## 2021-01-29 NOTE — Telephone Encounter (Addendum)
PA started on Cover My Meds

## 2021-02-08 NOTE — Telephone Encounter (Signed)
The note was removed from my box. I spoke to the pt and someone will pick it up.

## 2021-02-08 NOTE — Telephone Encounter (Signed)
Pt picked up paperwork.

## 2021-02-18 ENCOUNTER — Other Ambulatory Visit: Payer: Self-pay | Admitting: Internal Medicine

## 2021-02-18 DIAGNOSIS — E1169 Type 2 diabetes mellitus with other specified complication: Secondary | ICD-10-CM

## 2021-02-18 DIAGNOSIS — E785 Hyperlipidemia, unspecified: Secondary | ICD-10-CM

## 2021-03-27 ENCOUNTER — Telehealth: Payer: Self-pay | Admitting: Internal Medicine

## 2021-03-27 NOTE — Telephone Encounter (Signed)
Pt called in requesting a call back to discuss her memory loss stated it was also discuss with Dr. Silvio Pate wants to know is there anything that can be proscribe . Please advise [phone]

## 2021-03-28 NOTE — Telephone Encounter (Signed)
Spoke to pt. She also said she thinks she needs something for depression. She is pretty sure this has been discussed in the past.

## 2021-04-01 MED ORDER — BUPROPION HCL ER (XL) 150 MG PO TB24: 150.0000 mg | ORAL_TABLET | Freq: Every day | ORAL | 3 refills | Status: DC

## 2021-04-01 NOTE — Telephone Encounter (Signed)
LMTCB to schedule a 3-4 wk follow up

## 2021-04-01 NOTE — Telephone Encounter (Signed)
Spoke to pt. She will talk to her daughter and decide if she should get the Aricept or Wellbutrin. She is aware either one she chooses, she will need an OV in 3-4 weeks.

## 2021-04-01 NOTE — Addendum Note (Signed)
Addended by: Pilar Grammes on: 04/01/2021 11:15 AM   Modules accepted: Orders

## 2021-04-01 NOTE — Telephone Encounter (Signed)
Pt called back she would like to start the Wellbutrin

## 2021-04-01 NOTE — Telephone Encounter (Signed)
Rx for bupropion sent to [facility]. Please schedule pt an appt in the next 3-4 weeks for follow-up. Thanks.

## 2021-04-24 ENCOUNTER — Telehealth: Payer: Self-pay | Admitting: Internal Medicine

## 2021-04-24 NOTE — Telephone Encounter (Signed)
Lori Phillips called in and wanted to know about getting an updated copy of her medication , she is having issues with [facility] on what medication she takes. And wanted to know if she can get it emailed the email [email or web address].

## 2021-04-25 NOTE — Telephone Encounter (Signed)
Called and left message for pt on DPR that she can find her med list on MyChart. If she has trouble let me know and we will send I to her.

## 2021-04-29 ENCOUNTER — Encounter
Admission: RE | Disposition: A | Discharge status: Home / Self Care | Payer: Self-pay | Source: Home / Self Care | Attending: Gastroenterology

## 2021-04-29 ENCOUNTER — Encounter: Payer: Self-pay | Admitting: *Deleted

## 2021-04-29 ENCOUNTER — Ambulatory Visit: Payer: PPO | Admitting: Anesthesiology

## 2021-04-29 ENCOUNTER — Ambulatory Visit
Admission: RE | Admit: 2021-04-29 | Discharge: 2021-04-29 | Disposition: A | Discharge status: Home / Self Care | Payer: PPO | Attending: Gastroenterology | Admitting: Gastroenterology

## 2021-04-29 DIAGNOSIS — K219 Gastro-esophageal reflux disease without esophagitis: Secondary | ICD-10-CM | POA: Diagnosis not present

## 2021-04-29 DIAGNOSIS — Q438 Other specified congenital malformations of intestine: Secondary | ICD-10-CM | POA: Diagnosis not present

## 2021-04-29 DIAGNOSIS — D120 Benign neoplasm of cecum: Secondary | ICD-10-CM | POA: Diagnosis not present

## 2021-04-29 DIAGNOSIS — E119 Type 2 diabetes mellitus without complications: Secondary | ICD-10-CM | POA: Diagnosis not present

## 2021-04-29 DIAGNOSIS — E039 Hypothyroidism, unspecified: Secondary | ICD-10-CM | POA: Insufficient documentation

## 2021-04-29 DIAGNOSIS — I10 Essential (primary) hypertension: Secondary | ICD-10-CM | POA: Insufficient documentation

## 2021-04-29 DIAGNOSIS — I2510 Atherosclerotic heart disease of native coronary artery without angina pectoris: Secondary | ICD-10-CM | POA: Insufficient documentation

## 2021-04-29 DIAGNOSIS — Z800 Family history of malignant neoplasm of digestive organs: Secondary | ICD-10-CM | POA: Diagnosis not present

## 2021-04-29 DIAGNOSIS — Z1211 Encounter for screening for malignant neoplasm of colon: Secondary | ICD-10-CM | POA: Diagnosis not present

## 2021-04-29 DIAGNOSIS — F319 Bipolar disorder, unspecified: Secondary | ICD-10-CM | POA: Diagnosis not present

## 2021-04-29 DIAGNOSIS — Z79899 Other long term (current) drug therapy: Secondary | ICD-10-CM | POA: Insufficient documentation

## 2021-04-29 DIAGNOSIS — E785 Hyperlipidemia, unspecified: Secondary | ICD-10-CM | POA: Diagnosis not present

## 2021-04-29 DIAGNOSIS — K635 Polyp of colon: Secondary | ICD-10-CM | POA: Diagnosis not present

## 2021-04-29 DIAGNOSIS — Z86010 Personal history of colonic polyps: Secondary | ICD-10-CM | POA: Diagnosis not present

## 2021-04-29 DIAGNOSIS — K5730 Diverticulosis of large intestine without perforation or abscess without bleeding: Secondary | ICD-10-CM | POA: Insufficient documentation

## 2021-04-29 HISTORY — PX: COLONOSCOPY WITH PROPOFOL: SHX5780

## 2021-04-29 LAB — GLUCOSE, CAPILLARY: Glucose-Capillary: 132 mg/dL — ABNORMAL HIGH (ref 70–99)

## 2021-04-29 SURGERY — COLONOSCOPY WITH PROPOFOL
Anesthesia: General

## 2021-04-29 MED ORDER — SODIUM CHLORIDE 0.9 % IV SOLN: INTRAVENOUS | Status: DC

## 2021-04-29 MED ORDER — PROPOFOL 500 MG/50ML IV EMUL
INTRAVENOUS | Status: AC
  Filled 2021-04-29: qty 50

## 2021-04-29 MED ORDER — EPHEDRINE 5 MG/ML INJ
INTRAVENOUS | Status: AC
  Filled 2021-04-29: qty 5

## 2021-04-29 MED ORDER — EPHEDRINE SULFATE 50 MG/ML IJ SOLN
INTRAMUSCULAR | Status: DC | PRN
  Administered 2021-04-29: 5 mg via INTRAVENOUS
  Administered 2021-04-29: 10 mg via INTRAVENOUS

## 2021-04-29 MED ORDER — PROPOFOL 500 MG/50ML IV EMUL
INTRAVENOUS | Status: DC | PRN
  Administered 2021-04-29: 150 ug/kg/min via INTRAVENOUS

## 2021-04-29 MED ORDER — PHENYLEPHRINE HCL (PRESSORS) 10 MG/ML IV SOLN
INTRAVENOUS | Status: AC
  Filled 2021-04-29: qty 1

## 2021-04-29 NOTE — H&P (Signed)
Outpatient short stay form Pre-procedure 04/29/2021  Lori Rubenstein, MD  Primary Physician: Venia Carbon, MD  Reason for visit:  Surveillance  History of present illness:   66 y/o lady with history of DM II, hypothyroidism, and hypertension here for surveillance colonoscopy. Last colonoscopy was > 5 years ago and had several adenomatous polyps. No blood thinners. History of vaginal hysterectomy. Aunt and grandmother with colon cancer.    Current Facility-Administered Medications:    0.9 %  sodium chloride infusion, , Intravenous, Continuous, Davarius Ridener, Hilton Cork, MD  Medications Prior to Admission  Medication Sig Dispense Refill Last Dose   diltiazem (CARDIZEM CD) 180 MG 24 hr capsule TAKE 1 CAPSULE(180 MG) BY MOUTH DAILY 90 capsule 3 04/29/2021   divalproex (DEPAKOTE) 500 MG DR tablet Take 1 tablet (500 mg total) by mouth 2 (two) times daily. 180 tablet 3 04/28/2021   Dulaglutide (TRULICITY) 4.09 WJ/1.9JY SOPN Inject 0.75 mg into the skin once a week. 6.5 mL 3 04/28/2021   gabapentin (NEURONTIN) 100 MG capsule Take 100 mg by mouth 2 (two) times daily.   04/28/2021   glipiZIDE (GLUCOTROL) 5 MG tablet TAKE 1 TABLET(5 MG) BY MOUTH DAILY BEFORE BREAKFAST 90 tablet 3 04/28/2021   levothyroxine (SYNTHROID) 100 MCG tablet TAKE 1 TABLET(100 MCG) BY MOUTH DAILY BEFORE BREAKFAST 90 tablet 3 04/28/2021   lisinopril (ZESTRIL) 5 MG tablet TAKE 1 TABLET(5 MG) BY MOUTH DAILY 90 tablet 3 04/29/2021   pantoprazole (PROTONIX) 40 MG tablet TAKE 1 TABLET(40 MG) BY MOUTH DAILY 90 tablet 3 04/28/2021   rosuvastatin (CRESTOR) 20 MG tablet TAKE 1 TABLET BY MOUTH DAILY 90 tablet 3 04/28/2021   acetaminophen (TYLENOL) 650 MG CR tablet Take 650 mg by mouth 3 (three) times daily as needed for pain.      buPROPion (WELLBUTRIN XL) 150 MG 24 hr tablet Take 1 tablet (150 mg total) by mouth daily. 90 tablet 3    glucose blood (ONETOUCH VERIO) test strip Use to check blood sugar once a day. Dx Code E11.40 100 each 4    metFORMIN  (GLUCOPHAGE) 500 MG tablet TAKE 1 TABLET(500 MG) BY MOUTH TWICE DAILY WITH A MEAL 180 tablet 3    OneTouch Delica Lancets 78G MISC 1 each by Does not apply route daily. Use to obtain blood sample for blood sugar once a day. Dx Code E11.40 100 each 3    vitamin B-12 (CYANOCOBALAMIN) 500 MCG tablet Take 500 mcg by mouth at bedtime.       VITAMIN D PO Take by mouth.        Allergies  Allergen Reactions   Glyburide Other (See Comments)    Reaction:  Dizziness and confusion    Seroquel [Quetiapine Fumarate] Diarrhea, Nausea And Vomiting and Other (See Comments)    Reaction:  Confusion    Byetta 10 Mcg Pen [Exenatide] Nausea And Vomiting    Other reaction(s): Nausea And Vomiting     Past Medical History:  Diagnosis Date   Anxiety    with panic   Bipolar disorder ([place])    CAD (coronary artery disease)    multiple caths   Diabetes mellitus    Enlarged liver    Enlarged pituitary gland (HCC)    Fibromyalgia    GERD (gastroesophageal reflux disease)    Hyperlipidemia    Hypertension    Hypothyroidism    Kidney stones    Low back pain    Nephrolithiasis    Osteoarthritis    Osteoporosis    Parkinsonism (  [place]) 2016   better after antipsychotics stopped   Rheumatoid arthritis ([place])    Scoliosis     Review of systems:  Otherwise negative.    Physical Exam  Gen: Alert, oriented. Appears stated age.  HEENT: PERRLA. Lungs: No respiratory distress CV: RRR Abd: soft, benign, no masses Ext: No edema    Planned procedures: Proceed with colonoscopy. The patient understands the nature of the planned procedure, indications, risks, alternatives and potential complications including but not limited to bleeding, infection, perforation, damage to internal organs and possible oversedation/side effects from anesthesia. The patient agrees and gives consent to proceed.  Please refer to procedure notes for findings, recommendations and patient disposition/instructions.     Lori Rubenstein, MD [facility] Gastroenterology

## 2021-04-29 NOTE — Interval H&P Note (Signed)
History and Physical Interval Note:  04/29/2021 7:45 AM  Hoy Morn  has presented today for surgery, with the diagnosis of HX ADEN POLYPS.  The various methods of treatment have been discussed with the patient and family. After consideration of risks, benefits and other options for treatment, the patient has consented to  Procedure(s) with comments: COLONOSCOPY WITH PROPOFOL (N/A) - DM as a surgical intervention.  The patient's history has been reviewed, patient examined, no change in status, stable for surgery.  I have reviewed the patient's chart and labs.  Questions were answered to the patient's satisfaction.     Lori Phillips  Ok to proceed with colonoscopy

## 2021-04-29 NOTE — Anesthesia Postprocedure Evaluation (Signed)
Anesthesia Post Note  Patient: Lori Phillips  Procedure(s) Performed: COLONOSCOPY WITH PROPOFOL  Patient location during evaluation: Endoscopy Anesthesia Type: General Level of consciousness: awake and alert Pain management: pain level controlled Vital Signs Assessment: post-procedure vital signs reviewed and stable Respiratory status: spontaneous breathing, nonlabored ventilation, respiratory function stable and patient connected to nasal cannula oxygen Cardiovascular status: blood pressure returned to baseline and stable Postop Assessment: no apparent nausea or vomiting Anesthetic complications: no   No notable events documented.   Last Vitals:  Vitals:   04/29/21 0840 04/29/21 0850  BP: (!) 115/56 (!) 149/98  Pulse: (!) 54 (!) 46  Resp: 19 18  Temp:    SpO2: 99% 99%    Last Pain:  Vitals:   04/29/21 0820  TempSrc: Temporal                 Precious Haws [name]

## 2021-04-29 NOTE — Anesthesia Procedure Notes (Signed)
Date/Time: 04/29/2021 7:56 AM Performed by: Vaughan Sine Pre-anesthesia Checklist: Patient identified, Emergency Drugs available, Suction available, Patient being monitored and Timeout performed Patient Re-evaluated:Patient Re-evaluated prior to induction Oxygen Delivery Method: Nasal cannula Preoxygenation: Pre-oxygenation with 100% oxygen Induction Type: IV induction Placement Confirmation: positive ETCO2 and CO2 detector

## 2021-04-29 NOTE — Transfer of Care (Signed)
Immediate Anesthesia Transfer of Care Note  Patient: Lori Phillips  Procedure(s) Performed: COLONOSCOPY WITH PROPOFOL  Patient Location: PACU  Anesthesia Type:General  Level of Consciousness: awake and sedated  Airway & Oxygen Therapy: Patient Spontanous Breathing and Patient connected to nasal cannula oxygen  Post-op Assessment: Report given to RN and Post -op Vital signs reviewed and stable  Post vital signs: Reviewed and stable  Last Vitals:  Vitals Value Taken Time  BP    Temp    Pulse    Resp    SpO2      Last Pain:  Vitals:   04/29/21 0731  TempSrc: Temporal         Complications: No notable events documented.

## 2021-04-29 NOTE — Op Note (Signed)
[facility] Gastroenterology Patient Name: Lori Phillips Procedure Date: 04/29/2021 7:50 AM MRN: [id number] Account #: [id number] Date of Birth: 05/09/1955 Admit Type: Outpatient Age: 66 Room: [facility] ENDO ROOM 3 Gender: Female Note Status: Finalized Instrument Name: Jasper Riling [id number] Procedure:             Colonoscopy Indications:           Surveillance: Personal history of adenomatous polyps                         on last colonoscopy > 5 years ago Providers:             Andrey Farmer MD, MD Referring MD:          Venia Carbon (Referring MD) Medicines:             Monitored Anesthesia Care Complications:         No immediate complications. Estimated blood loss:                         Minimal. Procedure:             Pre-Anesthesia Assessment:                        - Prior to the procedure, a History and Physical was                         performed, and patient medications and allergies were                         reviewed. The patient is competent. The risks and                         benefits of the procedure and the sedation options and                         risks were discussed with the patient. All questions                         were answered and informed consent was obtained.                         Patient identification and proposed procedure were                         verified by the physician, the nurse, the                         anesthesiologist, the anesthetist and the technician                         in the endoscopy suite. Mental Status Examination:                         alert and oriented. Airway Examination: normal                         oropharyngeal airway and neck mobility. Respiratory  Examination: clear to auscultation. CV Examination:                         normal. Prophylactic Antibiotics: The patient does not                         require prophylactic antibiotics. Prior                          Anticoagulants: The patient has taken no previous                         anticoagulant or antiplatelet agents. ASA Grade                         Assessment: II - A patient with mild systemic disease.                         After reviewing the risks and benefits, the patient                         was deemed in satisfactory condition to undergo the                         procedure. The anesthesia plan was to use monitored                         anesthesia care (MAC). Immediately prior to                         administration of medications, the patient was                         re-assessed for adequacy to receive sedatives. The                         heart rate, respiratory rate, oxygen saturations,                         blood pressure, adequacy of pulmonary ventilation, and                         response to care were monitored throughout the                         procedure. The physical status of the patient was                         re-assessed after the procedure.                        After obtaining informed consent, the colonoscope was                         passed under direct vision. Throughout the procedure,                         the patient's blood pressure, pulse, and oxygen  saturations were monitored continuously. The                         Colonoscope was introduced through the anus and                         advanced to the the cecum, identified by appendiceal                         orifice and ileocecal valve. The colonoscopy was                         somewhat difficult due to a redundant colon.                         Successful completion of the procedure was aided by                         applying abdominal pressure. The patient tolerated the                         procedure well. The quality of the bowel preparation                         was good. Findings:      The perianal and digital rectal examinations were  normal.      A 2 mm polyp was found in the cecum. The polyp was sessile. The polyp       was removed with a cold snare. Resection and retrieval were complete.       Estimated blood loss was minimal.      A tattoo was seen in the descending colon. The tattoo site appeared       normal.      Scattered small-mouthed diverticula were found in the sigmoid colon,       descending colon, splenic flexure and hepatic flexure.      The exam was otherwise without abnormality on direct and retroflexion       views. Impression:            - One 2 mm polyp in the cecum, removed with a cold                         snare. Resected and retrieved.                        - A tattoo was seen in the descending colon. The                         tattoo site appeared normal.                        - Diverticulosis in the sigmoid colon, in the                         descending colon, at the splenic flexure and at the                         hepatic flexure.                        -  The examination was otherwise normal on direct and                         retroflexion views. Recommendation:        - Discharge patient to home.                        - Resume previous diet.                        - Continue present medications.                        - Await pathology results.                        - Repeat colonoscopy in 7 years for surveillance.                        - Return to referring physician as previously                         scheduled. Procedure Code(s):     --- Professional ---                        [phone], Colonoscopy, flexible; with removal of                         tumor(s), polyp(s), or other lesion(s) by snare                         technique Diagnosis Code(s):     --- Professional ---                        Z86.010, Personal history of colonic polyps                        K63.5, Polyp of colon                        K57.30, Diverticulosis of large intestine without                          perforation or abscess without bleeding CPT copyright 2019 American [employer] Association. All rights reserved. The codes documented in this report are preliminary and upon coder review may  be revised to meet current compliance requirements. Andrey Farmer MD, MD 04/29/2021 8:25:46 AM Number of Addenda: 0 Note Initiated On: 04/29/2021 7:50 AM Scope Withdrawal Time: 0 hours 10 minutes 13 seconds  Total Procedure Duration: 0 hours 25 minutes 13 seconds  Estimated Blood Loss:  Estimated blood loss was minimal.      [facility]

## 2021-04-29 NOTE — Anesthesia Preprocedure Evaluation (Signed)
Anesthesia Evaluation  Patient identified by MRN, date of birth, ID band Patient awake    Reviewed: Allergy & Precautions, NPO status , Patient's Chart, lab work & pertinent test results  History of Anesthesia Complications Negative for: history of anesthetic complications  Airway Mallampati: III  TM Distance: >3 FB Neck ROM: full    Dental  (+) Missing   Pulmonary neg shortness of breath,    Pulmonary exam normal        Cardiovascular hypertension, (-) angina+ CAD  Normal cardiovascular exam     Neuro/Psych  Neuromuscular disease negative psych ROS   GI/Hepatic Neg liver ROS, GERD  Medicated and Controlled,  Endo/Other  diabetes, Type 2Hypothyroidism   Renal/GU Renal disease  negative genitourinary   Musculoskeletal   Abdominal   Peds  Hematology negative hematology ROS (+)   Anesthesia Other Findings Past Medical History: No date: Anxiety     Comment:  with panic No date: Bipolar disorder (HCC) No date: CAD (coronary artery disease)     Comment:  multiple caths No date: Diabetes mellitus No date: Enlarged liver No date: Enlarged pituitary gland (HCC) No date: Fibromyalgia No date: GERD (gastroesophageal reflux disease) No date: Hyperlipidemia No date: Hypertension No date: Hypothyroidism No date: Kidney stones No date: Low back pain No date: Nephrolithiasis No date: Osteoarthritis No date: Osteoporosis 2016: Parkinsonism ([place])     Comment:  better after antipsychotics stopped No date: Rheumatoid arthritis ([place]) No date: Scoliosis  Past Surgical History: No date: BLADDER SURGERY     Comment:  ?bladder sling  No date: CARDIAC CATHETERIZATION     Comment:  x3 No date: CATARACT EXTRACTION, BILATERAL multiple: COLONOSCOPY     Comment:  2005 [place] records not available-polyps x 3 10/12/2015: COLONOSCOPY WITH PROPOFOL; N/A     Comment:  Procedure: COLONOSCOPY WITH PROPOFOL;  Surgeon: Lollie Sails, MD;  Location: [facility] ENDOSCOPY;  Service:               Endoscopy;  Laterality: N/A; No date: ESOPHAGEAL DILATION No date: kidney stone removal No date: TONSILLECTOMY No date: VAGINAL HYSTERECTOMY     Reproductive/Obstetrics negative OB ROS                             Anesthesia Physical Anesthesia Plan  ASA: 3  Anesthesia Plan: General   Post-op Pain Management:    Induction: Intravenous  PONV Risk Score and Plan: Propofol infusion and TIVA  Airway Management Planned: Natural Airway and Nasal Cannula  Additional Equipment:   Intra-op Plan:   Post-operative Plan:   Informed Consent: I have reviewed the patients History and Physical, chart, labs and discussed the procedure including the risks, benefits and alternatives for the proposed anesthesia with the patient or authorized representative who has indicated his/her understanding and acceptance.     Dental Advisory Given  Plan Discussed with: Anesthesiologist, CRNA and Surgeon  Anesthesia Plan Comments: (Patient consented for risks of anesthesia including but not limited to:  - adverse reactions to medications - risk of airway placement if required - damage to eyes, teeth, lips or other oral mucosa - nerve damage due to positioning  - sore throat or hoarseness - Damage to heart, brain, nerves, lungs, other parts of body or loss of life  Patient voiced understanding.)        Anesthesia Quick Evaluation

## 2021-04-30 LAB — SURGICAL PATHOLOGY

## 2021-05-19 ENCOUNTER — Other Ambulatory Visit: Payer: Self-pay | Admitting: Internal Medicine

## 2021-07-18 ENCOUNTER — Encounter: Payer: Self-pay | Admitting: Internal Medicine

## 2021-07-18 ENCOUNTER — Ambulatory Visit (INDEPENDENT_AMBULATORY_CARE_PROVIDER_SITE_OTHER): Payer: PPO | Admitting: Internal Medicine

## 2021-07-18 VITALS — BP 142/90 | HR 62 | Ht 66.0 in | Wt 134.0 lb

## 2021-07-18 DIAGNOSIS — F3131 Bipolar disorder, current episode depressed, mild: Secondary | ICD-10-CM

## 2021-07-18 DIAGNOSIS — E039 Hypothyroidism, unspecified: Secondary | ICD-10-CM | POA: Diagnosis not present

## 2021-07-18 DIAGNOSIS — E1140 Type 2 diabetes mellitus with diabetic neuropathy, unspecified: Secondary | ICD-10-CM | POA: Diagnosis not present

## 2021-07-18 DIAGNOSIS — G3184 Mild cognitive impairment, so stated: Secondary | ICD-10-CM

## 2021-07-18 LAB — POCT GLYCOSYLATED HEMOGLOBIN (HGB A1C): Hemoglobin A1C: 6.8 % — AB (ref 4.0–5.6)

## 2021-07-18 LAB — HM DIABETES FOOT EXAM

## 2021-07-18 MED ORDER — BUPROPION HCL ER (XL) 300 MG PO TB24: 300.0000 mg | ORAL_TABLET | Freq: Every day | ORAL | 3 refills | Status: DC

## 2021-07-18 NOTE — Progress Notes (Signed)
? ?Subjective:  ? ? Patient ID: Lori Phillips, female    DOB: 07-Nov-1955, 66 y.o.   MRN: [id number] ? ?HPI ?Here for follow up of diabetes and other chronic health conditions ?With daughter ? ?Daughter does voice concerns ?Wellbutrin has helped her---dealing with stress (like pulling her sister off life support) ?Ongoing depression still  ?Sits in chair--not exercising or doing her word searches, etc ?Less active and resists doing things ? ?Still has some trouble with memory---repeats herself, etc ?Does butt heads with granddaughter at times (they all live together) ?Will have rare manic spells--much less than in the past ? ?No sig edema ?No sig nerve pain--just in back ?Mostly can't stand for extended times---pain if up for 15 minutes ?They wonder about gabapentin having side effects ? ?Not checking sugars--though they have the equipment ?Mild sensory changes ?No low sugar reactions ? ?Current Outpatient Medications on File Prior to Visit  ?Medication Sig Dispense Refill  ? acetaminophen (TYLENOL) 650 MG CR tablet Take 650 mg by mouth 3 (three) times daily as needed for pain.    ? buPROPion (WELLBUTRIN XL) 150 MG 24 hr tablet Take 1 tablet (150 mg total) by mouth daily. 90 tablet 3  ? diltiazem (CARDIZEM CD) 180 MG 24 hr capsule TAKE 1 CAPSULE(180 MG) BY MOUTH DAILY 90 capsule 3  ? divalproex (DEPAKOTE) 500 MG DR tablet Take 1 tablet (500 mg total) by mouth 2 (two) times daily. 180 tablet 3  ? Dulaglutide (TRULICITY) 1.49 FW/2.6VZ SOPN Inject 0.75 mg into the skin once a week. 6.5 mL 3  ? gabapentin (NEURONTIN) 100 MG capsule Take 100 mg by mouth 2 (two) times daily.    ? glipiZIDE (GLUCOTROL) 5 MG tablet TAKE 1 TABLET(5 MG) BY MOUTH DAILY BEFORE BREAKFAST 90 tablet 3  ? glucose blood (ONETOUCH VERIO) test strip Use to check blood sugar once a day. Dx Code E11.40 100 each 4  ? levothyroxine (SYNTHROID) 100 MCG tablet TAKE 1 TABLET(100 MCG) BY MOUTH DAILY BEFORE BREAKFAST 90 tablet 3  ? lisinopril (ZESTRIL) 5 MG  tablet TAKE 1 TABLET(5 MG) BY MOUTH DAILY 90 tablet 3  ? metFORMIN (GLUCOPHAGE) 500 MG tablet TAKE 1 TABLET(500 MG) BY MOUTH TWICE DAILY WITH A MEAL 180 tablet 3  ? OneTouch Delica Lancets 85Y MISC 1 each by Does not apply route daily. Use to obtain blood sample for blood sugar once a day. Dx Code E11.40 100 each 3  ? pantoprazole (PROTONIX) 40 MG tablet TAKE 1 TABLET(40 MG) BY MOUTH DAILY 90 tablet 3  ? rosuvastatin (CRESTOR) 20 MG tablet TAKE 1 TABLET BY MOUTH DAILY 90 tablet 3  ? vitamin B-12 (CYANOCOBALAMIN) 500 MCG tablet Take 500 mcg by mouth at bedtime.     ? VITAMIN D PO Take by mouth.    ? [DISCONTINUED] TAZTIA XT 180 MG 24 hr capsule TAKE ONE CAPSULE BY MOUTH EVERY DAY 90 capsule 0  ? ?No current facility-administered medications on file prior to visit.  ? ? ?Allergies  ?Allergen Reactions  ? Glyburide Other (See Comments)  ?  Reaction:  Dizziness and confusion   ? Seroquel [Quetiapine Fumarate] Diarrhea, Nausea And Vomiting and Other (See Comments)  ?  Reaction:  Confusion   ? Byetta 10 Mcg Pen [Exenatide] Nausea And Vomiting  ?  Other reaction(s): Nausea And Vomiting  ? ? ?Past Medical History:  ?Diagnosis Date  ? Anxiety   ? with panic  ? Bipolar disorder ([place])   ? CAD (coronary artery disease)   ?  multiple caths  ? Diabetes mellitus   ? Enlarged liver   ? Enlarged pituitary gland ([place])   ? Fibromyalgia   ? GERD (gastroesophageal reflux disease)   ? Hyperlipidemia   ? Hypertension   ? Hypothyroidism   ? Kidney stones   ? Low back pain   ? Nephrolithiasis   ? Osteoarthritis   ? Osteoporosis   ? Parkinsonism ([place]) 2016  ? better after antipsychotics stopped  ? Rheumatoid arthritis ([place])   ? Scoliosis   ? ? ?Past Surgical History:  ?Procedure Laterality Date  ? BLADDER SURGERY    ? ?bladder sling   ? CARDIAC CATHETERIZATION    ? x3  ? CATARACT EXTRACTION, BILATERAL    ? COLONOSCOPY  multiple  ? 2005 [place] records not available-polyps x 3  ? COLONOSCOPY WITH PROPOFOL N/A 10/12/2015  ? Procedure:  COLONOSCOPY WITH PROPOFOL;  Surgeon: Lollie Sails, MD;  Location: [facility] ENDOSCOPY;  Service: Endoscopy;  Laterality: N/A;  ? COLONOSCOPY WITH PROPOFOL N/A 04/29/2021  ? Procedure: COLONOSCOPY WITH PROPOFOL;  Surgeon: [name], MD;  Location: [facility] ENDOSCOPY;  Service: Endoscopy;  Laterality: N/A;  DM  ? ESOPHAGEAL DILATION    ? kidney stone removal    ? TONSILLECTOMY    ? VAGINAL HYSTERECTOMY    ? ? ?Family History  ?Problem Relation Age of Onset  ? Hypertension Mother   ? Diabetes Mother   ? Bipolar disorder Father   ? Hypertension Father   ? Colon cancer Paternal Aunt   ? Colon cancer Paternal Grandmother   ? Thyroid disease Neg Hx   ? Breast cancer Neg Hx   ? ? ?Social History  ? ?Socioeconomic History  ? Marital status: Widowed  ?  Spouse name: Not on file  ? Number of children: 3  ? Years of education: 55  ? Highest education level: Not on file  ?Occupational History  ? Occupation: Research officer, trade union company  ?  Comment: disabled  ?Tobacco Use  ? Smoking status: Never  ? Smokeless tobacco: Never  ?Vaping Use  ? Vaping Use: Never used  ?Substance and Sexual Activity  ? Alcohol use: Yes  ?  Comment: very rare  ? Drug use: No  ? Sexual activity: Not on file  ?Other Topics Concern  ? Not on file  ?Social History Narrative  ? Daughter and granddaughter live with her  ?   ? Has a living will- desires CPR but no prolonged life support if futile.  ? Daughter [name] is health care POA  ? No tube feeds if cognitively unaware  ? ?Social Determinants of Health  ? ?Financial Resource Strain: Not on file  ?Food Insecurity: Not on file  ?Transportation Needs: Not on file  ?Physical Activity: Not on file  ?Stress: Not on file  ?Social Connections: Not on file  ?Intimate Partner Violence: Not on file  ? ?Review of Systems ?No sleep problems---other than sleeping too much at times ?Appetite down on the trulicity ? ?   ?Objective:  ? Physical Exam ?Constitutional:   ?   Appearance: Normal appearance.   ?Cardiovascular:  ?   Rate and Rhythm: Normal rate and regular rhythm.  ?   Heart sounds: No murmur heard. ?  No gallop.  ?   Comments: Faint pedal pulses ?Pulmonary:  ?   Effort: Pulmonary effort is normal.  ?   Breath sounds: Normal breath sounds. No wheezing or rales.  ?Musculoskeletal:  ?   Cervical back: Neck supple.  ?  Right lower leg: No edema.  ?   Left lower leg: No edema.  ?Lymphadenopathy:  ?   Cervical: No cervical adenopathy.  ?Skin: ?   Comments: No foot lesions  ?Neurological:  ?   Mental Status: She is alert.  ?   Comments: Fairly normal sensation in feet  ?Psychiatric:  ?   Comments: No overt depression  ?  ? ? ? ? ?   ?Assessment & Plan:  ? ?

## 2021-07-18 NOTE — Assessment & Plan Note (Signed)
Lab Results  ?Component Value Date  ? HGBA1C 6.8 (A) 07/18/2021  ? ?Continues to have good control ?Some concerns about ongoing weight loss---if that persists (even after changing meds for depression), we will stop the trulicity 1.94 mg weekly ?Continue glipizide '5mg'$  daily and metformin 500 bid ?

## 2021-07-18 NOTE — Assessment & Plan Note (Signed)
Seems euthyroid other than weight loss ?Levothyroxine 157mg daily ?

## 2021-07-18 NOTE — Patient Instructions (Signed)
Increase the bupropion to '300mg'$  daily (morning). ?Try off the gabapentin. ?If you continue to lose weight, stop the trulicity (then it would be very important for you to monitor your sugar--at least weekly) ?

## 2021-07-18 NOTE — Assessment & Plan Note (Signed)
Concerns about depression causing memory issues ???gabapentin---will try off this ?

## 2021-07-18 NOTE — Assessment & Plan Note (Signed)
Rare brief mania ?Persistent depression---some better with low dose bupropion ?Will increase bupropion to '300mg'$   ?Continue depakote 500 bid ?

## 2021-07-19 LAB — RENAL FUNCTION PANEL
Albumin: 4.6 g/dL (ref 3.5–5.2)
BUN: 33 mg/dL — ABNORMAL HIGH (ref 6–23)
CO2: 26 mEq/L (ref 19–32)
Calcium: 10.1 mg/dL (ref 8.4–10.5)
Chloride: 104 mEq/L (ref 96–112)
Creatinine, Ser: 1.4 mg/dL — ABNORMAL HIGH (ref 0.40–1.20)
GFR: 38.76 mL/min — ABNORMAL LOW (ref >60.00)
Glucose, Bld: 140 mg/dL — ABNORMAL HIGH (ref 70–99)
Phosphorus: 3.7 mg/dL (ref 2.3–4.6)
Potassium: 4.7 mEq/L (ref 3.5–5.1)
Sodium: 139 mEq/L (ref 135–145)

## 2021-07-19 LAB — LIPID PANEL
Cholesterol: 143 mg/dL (ref 0–200)
HDL: 49.6 mg/dL (ref >39.00)
NonHDL: 93.83
Total CHOL/HDL Ratio: 3
Triglycerides: 202 mg/dL — ABNORMAL HIGH (ref 0.0–149.0)
VLDL: 40.4 mg/dL — ABNORMAL HIGH (ref 0.0–40.0)

## 2021-07-19 LAB — CBC
HCT: 38.3 % (ref 36.0–46.0)
Hemoglobin: 12.6 g/dL (ref 12.0–15.0)
MCHC: 33 g/dL (ref 30.0–36.0)
MCV: 94.4 fl (ref 78.0–100.0)
Platelets: 247 10*3/uL (ref 150.0–400.0)
RBC: 4.06 Mil/uL (ref 3.87–5.11)
RDW: 13.7 % (ref 11.5–15.5)
WBC: 8.6 10*3/uL (ref 4.0–10.5)

## 2021-07-19 LAB — HEPATIC FUNCTION PANEL
ALT: 15 U/L (ref 0–35)
AST: 16 U/L (ref 0–37)
Albumin: 4.6 g/dL (ref 3.5–5.2)
Alkaline Phosphatase: 71 U/L (ref 39–117)
Bilirubin, Direct: 0.1 mg/dL (ref 0.0–0.3)
Total Bilirubin: 0.3 mg/dL (ref 0.2–1.2)
Total Protein: 6.7 g/dL (ref 6.0–8.3)

## 2021-07-19 LAB — VALPROIC ACID LEVEL: Valproic Acid Lvl: <12.5 mg/L — ABNORMAL LOW (ref 50.0–100.0)

## 2021-07-19 LAB — MICROALBUMIN / CREATININE URINE RATIO
Creatinine,U: 45.3 mg/dL
Microalb Creat Ratio: 4.9 mg/g (ref 0.0–30.0)
Microalb, Ur: 2.2 mg/dL — ABNORMAL HIGH (ref 0.0–1.9)

## 2021-07-19 LAB — T4, FREE: Free T4: 0.99 ng/dL (ref 0.60–1.60)

## 2021-07-19 LAB — TSH: TSH: 1.22 u[IU]/mL (ref 0.35–5.50)

## 2021-07-19 LAB — LDL CHOLESTEROL, DIRECT: Direct LDL: 59 mg/dL

## 2021-07-21 ENCOUNTER — Other Ambulatory Visit: Payer: Self-pay | Admitting: Internal Medicine

## 2021-07-21 DIAGNOSIS — I10 Essential (primary) hypertension: Secondary | ICD-10-CM

## 2021-07-23 ENCOUNTER — Other Ambulatory Visit: Payer: Self-pay | Admitting: Internal Medicine

## 2021-07-23 DIAGNOSIS — N1832 Chronic kidney disease, stage 3b: Secondary | ICD-10-CM

## 2021-07-23 NOTE — Progress Notes (Signed)
Referral placed.

## 2021-08-06 ENCOUNTER — Encounter: Payer: Self-pay | Admitting: *Deleted

## 2021-09-30 ENCOUNTER — Telehealth: Payer: Self-pay | Admitting: Internal Medicine

## 2021-09-30 NOTE — Telephone Encounter (Signed)
Patient called stating that her memory issues have increased since her last appointment and wants to know if she can have another brain scan. Also, having issues with swallowing and wondering if her esophagus needs to be stretched again. Please advise.

## 2021-09-30 NOTE — Telephone Encounter (Signed)
Spoke to pt. She will have to get with her daughter and call back for an appt.

## 2021-10-02 DIAGNOSIS — N200 Calculus of kidney: Secondary | ICD-10-CM | POA: Diagnosis not present

## 2021-10-02 DIAGNOSIS — R8290 Unspecified abnormal findings in urine: Secondary | ICD-10-CM | POA: Diagnosis not present

## 2021-10-02 DIAGNOSIS — F319 Bipolar disorder, unspecified: Secondary | ICD-10-CM | POA: Diagnosis not present

## 2021-10-02 DIAGNOSIS — R809 Proteinuria, unspecified: Secondary | ICD-10-CM | POA: Diagnosis not present

## 2021-10-02 DIAGNOSIS — E1122 Type 2 diabetes mellitus with diabetic chronic kidney disease: Secondary | ICD-10-CM | POA: Diagnosis not present

## 2021-10-02 DIAGNOSIS — I10 Essential (primary) hypertension: Secondary | ICD-10-CM | POA: Diagnosis not present

## 2021-10-02 DIAGNOSIS — N1832 Chronic kidney disease, stage 3b: Secondary | ICD-10-CM | POA: Diagnosis not present

## 2021-10-02 HISTORY — DX: Proteinuria, unspecified: R80.9

## 2021-10-02 HISTORY — DX: Unspecified abnormal findings in urine: R82.90

## 2021-10-08 ENCOUNTER — Ambulatory Visit (INDEPENDENT_AMBULATORY_CARE_PROVIDER_SITE_OTHER): Payer: PPO | Admitting: Internal Medicine

## 2021-10-08 ENCOUNTER — Encounter: Payer: Self-pay | Admitting: Internal Medicine

## 2021-10-08 VITALS — BP 118/70 | HR 50 | Temp 97.5°F | Ht 66.0 in | Wt 134.0 lb

## 2021-10-08 DIAGNOSIS — G3184 Mild cognitive impairment, so stated: Secondary | ICD-10-CM

## 2021-10-08 NOTE — Progress Notes (Signed)
Subjective:    Patient ID: Lori Phillips, female    DOB: Feb 14, 1956, 66 y.o.   MRN: [id number]  HPI Here with daughter due to concerns about worsening memory  Memory is worsening Repeats herself a lot--- repeats questions and comments Sometimes she will realize she has done this--but not always Daughter notes that she can ask the same question 6 times in 2 hours---other times it is more spread out  Daughter feels she "exaggerates things" at times Doing research on dementia--since past testing that was negative  Asked about mood---"I may be having some problems with my mood" Is better with the wellbutrin More active and better since off gabapentin Daughter let her do her meds for 2 months---but wasn't doing it right so daughter has taken over   Current Outpatient Medications on File Prior to Visit  Medication Sig Dispense Refill   acetaminophen (TYLENOL) 650 MG CR tablet Take 650 mg by mouth 3 (three) times daily as needed for pain.     buPROPion (WELLBUTRIN XL) 300 MG 24 hr tablet Take 1 tablet (300 mg total) by mouth daily. 90 tablet 3   diltiazem (CARDIZEM CD) 180 MG 24 hr capsule TAKE 1 CAPSULE(180 MG) BY MOUTH DAILY 90 capsule 3   divalproex (DEPAKOTE) 500 MG DR tablet Take 1 tablet (500 mg total) by mouth 2 (two) times daily. 180 tablet 3   glipiZIDE (GLUCOTROL) 5 MG tablet TAKE 1 TABLET(5 MG) BY MOUTH DAILY BEFORE BREAKFAST 90 tablet 3   glucose blood (ONETOUCH VERIO) test strip Use to check blood sugar once a day. Dx Code E11.40 100 each 4   levothyroxine (SYNTHROID) 100 MCG tablet TAKE 1 TABLET BY MOUTH DAILY BEFORE BREAKFAST 90 tablet 3   lisinopril (ZESTRIL) 5 MG tablet TAKE 1 TABLET(5 MG) BY MOUTH DAILY 90 tablet 3   metFORMIN (GLUCOPHAGE) 500 MG tablet TAKE 1 TABLET(500 MG) BY MOUTH TWICE DAILY WITH A MEAL 180 tablet 3   OneTouch Delica Lancets 14G MISC 1 each by Does not apply route daily. Use to obtain blood sample for blood sugar once a day. Dx Code E11.40 100 each 3    pantoprazole (PROTONIX) 40 MG tablet TAKE 1 TABLET(40 MG) BY MOUTH DAILY 90 tablet 3   rosuvastatin (CRESTOR) 20 MG tablet TAKE 1 TABLET BY MOUTH DAILY 90 tablet 3   vitamin B-12 (CYANOCOBALAMIN) 500 MCG tablet Take 500 mcg by mouth at bedtime.      VITAMIN D PO Take by mouth.     [DISCONTINUED] TAZTIA XT 180 MG 24 hr capsule TAKE ONE CAPSULE BY MOUTH EVERY DAY 90 capsule 0   No current facility-administered medications on file prior to visit.    Allergies  Allergen Reactions   Glyburide Other (See Comments)    Reaction:  Dizziness and confusion    Seroquel [Quetiapine Fumarate] Diarrhea, Nausea And Vomiting and Other (See Comments)    Reaction:  Confusion    Byetta 10 Mcg Pen [Exenatide] Nausea And Vomiting    Other reaction(s): Nausea And Vomiting    Past Medical History:  Diagnosis Date   Anxiety    with panic   Bipolar disorder ([place])    CAD (coronary artery disease)    multiple caths   Diabetes mellitus    Enlarged liver    Enlarged pituitary gland (HCC)    Fibromyalgia    GERD (gastroesophageal reflux disease)    Hyperlipidemia    Hypertension    Hypothyroidism    Kidney stones  Low back pain    Nephrolithiasis    Osteoarthritis    Osteoporosis    Parkinsonism ([place]) 2016   better after antipsychotics stopped   Rheumatoid arthritis ([place])    Scoliosis     Past Surgical History:  Procedure Laterality Date   BLADDER SURGERY     ?bladder sling    CARDIAC CATHETERIZATION     x3   CATARACT EXTRACTION, BILATERAL     COLONOSCOPY  multiple   2005 [place] records not available-polyps x 3   COLONOSCOPY WITH PROPOFOL N/A 10/12/2015   Procedure: COLONOSCOPY WITH PROPOFOL;  Surgeon: Lollie Sails, MD;  Location: [facility] ENDOSCOPY;  Service: Endoscopy;  Laterality: N/A;   COLONOSCOPY WITH PROPOFOL N/A 04/29/2021   Procedure: COLONOSCOPY WITH PROPOFOL;  Surgeon: [name], MD;  Location: [facility] ENDOSCOPY;  Service: Endoscopy;  Laterality: N/A;  DM    ESOPHAGEAL DILATION     kidney stone removal     TONSILLECTOMY     VAGINAL HYSTERECTOMY      Family History  Problem Relation Age of Onset   Hypertension Mother    Diabetes Mother    Bipolar disorder Father    Hypertension Father    Colon cancer Paternal Aunt    Colon cancer Paternal Grandmother    Thyroid disease Neg Hx    Breast cancer Neg Hx     Social History   Socioeconomic History   Marital status: Widowed    Spouse name: Not on file   Number of children: 3   Years of education: 30   Highest education level: Not on file  Occupational History   Occupation: [place]: disabled  Tobacco Use   Smoking status: Never   Smokeless tobacco: Never  Vaping Use   Vaping Use: Never used  Substance and Sexual Activity   Alcohol use: Yes    Comment: very rare   Drug use: No   Sexual activity: Not on file  Other Topics Concern   Not on file  Social History Narrative   Daughter and granddaughter live with her      Has a living will- desires CPR but no prolonged life support if futile.   Daughter Museum/gallery conservator is health care POA   No tube feeds if cognitively unaware   Social Determinants of Health   Financial Resource Strain: Not on file  Food Insecurity: Not on file  Transportation Needs: Not on file  Physical Activity: Not on file  Stress: Not on file  Social Connections: Not on file  Intimate Partner Violence: Not on file   Review of Systems Daughter lives with her and cares for the house Sleeps okay Is off the trulicity---weight kept going down     Objective:   Physical Exam Constitutional:      Appearance: Normal appearance.  Neurological:     Mental Status: She is alert.  Psychiatric:     Comments: No overt depression Fairly normal interaction            Assessment & Plan:

## 2021-10-08 NOTE — Assessment & Plan Note (Signed)
Still has not had functional decline---other than not driving (not new) Discussed --would defer further testing till seen by neurology Will likely need neuropsych testing as well Doesn't seem like Alzheimer's May have vascular component---but may also be related to bipolar/depression affective state Should consider ASA Start with neurology

## 2021-10-10 ENCOUNTER — Other Ambulatory Visit: Payer: Self-pay | Admitting: Nephrology

## 2021-10-10 DIAGNOSIS — R809 Proteinuria, unspecified: Secondary | ICD-10-CM

## 2021-10-10 DIAGNOSIS — R8290 Unspecified abnormal findings in urine: Secondary | ICD-10-CM

## 2021-10-10 DIAGNOSIS — N1832 Chronic kidney disease, stage 3b: Secondary | ICD-10-CM

## 2021-10-10 DIAGNOSIS — I10 Essential (primary) hypertension: Secondary | ICD-10-CM

## 2021-10-10 DIAGNOSIS — E1122 Type 2 diabetes mellitus with diabetic chronic kidney disease: Secondary | ICD-10-CM

## 2021-10-14 ENCOUNTER — Encounter: Payer: Self-pay | Admitting: Physician Assistant

## 2021-10-23 ENCOUNTER — Telehealth: Payer: Self-pay | Admitting: Internal Medicine

## 2021-10-23 ENCOUNTER — Ambulatory Visit
Admission: RE | Admit: 2021-10-23 | Discharge: 2021-10-23 | Disposition: A | Discharge status: Home / Self Care | Payer: PPO | Source: Ambulatory Visit | Attending: Nephrology | Admitting: Nephrology

## 2021-10-23 ENCOUNTER — Encounter: Payer: Self-pay | Admitting: Physician Assistant

## 2021-10-23 ENCOUNTER — Ambulatory Visit: Payer: PPO | Admitting: Physician Assistant

## 2021-10-23 VITALS — BP 150/56 | HR 64 | Resp 18 | Ht 66.0 in | Wt 133.0 lb

## 2021-10-23 DIAGNOSIS — N1832 Chronic kidney disease, stage 3b: Secondary | ICD-10-CM

## 2021-10-23 DIAGNOSIS — R809 Proteinuria, unspecified: Secondary | ICD-10-CM | POA: Diagnosis not present

## 2021-10-23 DIAGNOSIS — F319 Bipolar disorder, unspecified: Secondary | ICD-10-CM

## 2021-10-23 DIAGNOSIS — E1122 Type 2 diabetes mellitus with diabetic chronic kidney disease: Secondary | ICD-10-CM | POA: Diagnosis not present

## 2021-10-23 DIAGNOSIS — R8290 Unspecified abnormal findings in urine: Secondary | ICD-10-CM

## 2021-10-23 DIAGNOSIS — G3184 Mild cognitive impairment, so stated: Secondary | ICD-10-CM

## 2021-10-23 DIAGNOSIS — I10 Essential (primary) hypertension: Secondary | ICD-10-CM | POA: Diagnosis not present

## 2021-10-23 DIAGNOSIS — E119 Type 2 diabetes mellitus without complications: Secondary | ICD-10-CM | POA: Diagnosis not present

## 2021-10-23 DIAGNOSIS — N281 Cyst of kidney, acquired: Secondary | ICD-10-CM | POA: Diagnosis not present

## 2021-10-23 DIAGNOSIS — R413 Other amnesia: Secondary | ICD-10-CM | POA: Diagnosis not present

## 2021-10-23 NOTE — Progress Notes (Signed)
Assessment/Plan:   Lori Phillips is a very pleasant 66 y.o. year old RH female with  a history of hypertension, hyperlipidemia, DM2, anxiety with panic attacks, bipolar disorder, depression, CAD, fibromyalgia, history of CKD, hypothyroidism, rheumatoid arthritis, B12 deficiency, hepatomegaly.  Seen today for evaluation of memory loss.  Prior MRI in February 2022 was remarkable for generalized advanced for age atrophy, and mild chronic small vessel disease.  MoCA today is 22+1 with 0/5 delayed recall, 3/5 visual-spatial but with good attention  language abstraction and orientation.  Daughter reports that the patient reads all of the symptoms of a disease, and then is aware of how to reproduce them, "as a form of manipulating me "-daughter says.  It is unclear the etiology of the disease, however, there is enough evidence that despite psychiatric issues, Alzheimer's disease may be present.    Recommendations:   MIld Cognitive Impairment, likely due to Alzheimer's disease with behavioral disturbance.  MRI brain with/without contrast to assess for underlying structural abnormality and assess vascular load  Neurocognitive testing to further evaluate cognitive concerns and determine other additional underlying cause of memory changes, including potential contribution from sleep, anxiety, or depression  Continue B12 replenishment   Subjective:    The patient is seen in neurologic consultation at the request of Venia Carbon, MD for the evaluation of memory.  The patient is accompanied by her daughter and granddaughter who supplements the history.   How long did patient have memory difficulties?  Patient reports that her memory changes have been present for about 1 year, getting harder to recall new information, new names.   Patient lives with: Daughter and granddaughter. repeats oneself?  "A lot "-repeats questions and comments. She is aware of what is going on. Daughter notes that she  can asked the same questions 6 times in 2 hours.  Of note, the daughter reports that the patient has been doing research on dementia,  since her past testing was negative for date.  She has been reading just about every pamphlet she can.  She feels like her mother is manipulating her diagnosis. Disoriented when walking into a room?  Patient denies   Leaving objects in unusual places?  Patient denies   Ambulates  with difficulty?  Her daughter states that the patient "may drag her feet to go to the grocery store, but if she has to take a check to the bank, then she runs ".   Recent falls?  Patient denies   Any head injuries?  Patient denies   History of seizures?   Patient denies   Wandering behavior?  Patient denies   Patient drives?   Patient no longer drives. "I just don't drive" "she is not a good driver, she pays attention to everything but to the road "-daughter says    Any mood changes such irritability agitation?  "They may be having some problems with my mood "-daughter endorses " It is better with Wellbutrin, more alert and present ".  She has a history of bipolar disorder, but for the last 6 years she has not been on medication because she refuses to go to the doctor, and in the interim, "she expects people to do things for her at all times ", especially her daughter.  Any history of depression?:  Has a history of depression.  She does not like to interact with people, she has refused several times going to senior centers.  When the pamphlets arrive by mail, she hides them  so no one can see them-daughter says. Hallucinations?  Patient denies   Paranoia?  The patient has had a diagnosis of borderline paranoid-schizophrenic (going to [place])  Patient reports that he sleeps well without vivid dreams, REM behavior or sleepwalking    History of sleep apnea?  In the past she used in the past her CPAP, but that since her sinus surgery, she does not need it anymore. Any hygiene concerns?  Endorsed. She  doesn't shower enough.  She has bed sores.  She "fakes "her showers especially during the last year Independent of bathing and dressing?  She her daughter has to help her Does the patient needs help with medications?  Daughter is in charge "as she was not doing it right "-daughter said..  Who is in charge of the finances is in charge Any changes in appetite?  Patient denies.  She was losing weight to trulicity and gabapentin, she is off of those medications, and has increased her weight Patient have trouble swallowing? Patient denies  needs a new esophageal dilatation. Does the patient cook?  Patient denies   Any kitchen accidents such as leaving the stove on? Patient denies   Any headaches?  Patient denies   The double vision? Patient denies   Any focal numbness or tingling?  Patient denies   Chronic back pain Patient denies   Unilateral weakness?  Patient denies   Any tremors?  Patient denies  when nervous or tired or sugars low  Any history of anosmia?  Patient denies   Any incontinence of urine?  Needs diapers "hard to hold it if I had to go ".  Her daughter states that she feels up to the point that she has to go to the bathroom. Any bowel dysfunction?   She has chronic diarrhea.  She also takes Alka-Seltzer daily. History of heavy alcohol intake?  Patient denies   History of heavy tobacco use?  Patient denies   Family history of dementia?  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  Mild chronic small vessel change of the white matter.  No abnormalities in brainstem or cerebellum.  No hydrocephalus.  Past Medical History:  Diagnosis Date   Anxiety    with panic   Bipolar disorder ([place])     CAD (coronary artery disease)    multiple caths   Diabetes mellitus    Enlarged liver    Enlarged pituitary gland (HCC)    Fibromyalgia    GERD (gastroesophageal reflux disease)    Hyperlipidemia    Hypertension    Hypothyroidism    Kidney stones    Low back pain    Nephrolithiasis    Osteoarthritis    Osteoporosis    Parkinsonism ([place]) 2016   better after antipsychotics stopped   Rheumatoid arthritis ([place])    Scoliosis      Past Surgical History:  Procedure Laterality Date   BLADDER SURGERY     ?bladder sling    CARDIAC CATHETERIZATION     x3   CATARACT EXTRACTION, BILATERAL     COLONOSCOPY  multiple   2005 [place] records not available-polyps x 3   COLONOSCOPY WITH PROPOFOL N/A 10/12/2015   Procedure: COLONOSCOPY WITH PROPOFOL;  Surgeon:  Lollie Sails, MD;  Location: [facility] ENDOSCOPY;  Service: Endoscopy;  Laterality: N/A;   COLONOSCOPY WITH PROPOFOL N/A 04/29/2021   Procedure: COLONOSCOPY WITH PROPOFOL;  Surgeon: [name], MD;  Location: [facility] ENDOSCOPY;  Service: Endoscopy;  Laterality: N/A;  DM   ESOPHAGEAL DILATION     kidney stone removal     TONSILLECTOMY     VAGINAL HYSTERECTOMY       Allergies  Allergen Reactions   Glyburide Other (See Comments)    Reaction:  Dizziness and confusion    Seroquel [Quetiapine Fumarate] Diarrhea, Nausea And Vomiting and Other (See Comments)    Reaction:  Confusion    Byetta 10 Mcg Pen [Exenatide] Nausea And Vomiting    Other reaction(s): Nausea And Vomiting    Current Outpatient Medications  Medication Instructions   acetaminophen (TYLENOL) 650 mg, Oral, 3 times daily PRN   buPROPion (WELLBUTRIN XL) 300 mg, Oral, Daily   diltiazem (CARDIZEM CD) 180 MG 24 hr capsule TAKE 1 CAPSULE(180 MG) BY MOUTH DAILY   divalproex (DEPAKOTE) 500 mg, Oral, 2 times daily   glipiZIDE (GLUCOTROL) 5 MG tablet TAKE 1 TABLET(5 MG) BY MOUTH DAILY BEFORE BREAKFAST   glucose blood (ONETOUCH VERIO) test strip Use to check blood  sugar once a day. Dx Code E11.40   levothyroxine (SYNTHROID) 100 MCG tablet TAKE 1 TABLET BY MOUTH DAILY BEFORE BREAKFAST   lisinopril (ZESTRIL) 5 MG tablet TAKE 1 TABLET(5 MG) BY MOUTH DAILY   metFORMIN (GLUCOPHAGE) 500 MG tablet TAKE 1 TABLET(500 MG) BY MOUTH TWICE DAILY WITH A MEAL   OneTouch Delica Lancets 46O MISC 1 each, Does not apply, Daily, Use to obtain blood sample for blood sugar once a day. Dx Code E11.40   pantoprazole (PROTONIX) 40 MG tablet TAKE 1 TABLET(40 MG) BY MOUTH DAILY   rosuvastatin (CRESTOR) 20 MG tablet TAKE 1 TABLET BY MOUTH DAILY   vitamin B-12 (CYANOCOBALAMIN) 500 mcg, Oral, Daily at bedtime   VITAMIN D PO Oral     VITALS:  There were no vitals filed for this visit.    07/18/2021    2:46 PM 10/02/2016   10:55 AM 01/30/2015    9:59 AM 09/28/2014    7:45 AM 08/26/2013    1:09 PM  Depression screen PHQ 2/9  Decreased Interest 1 0 0 3 1  Down, Depressed, Hopeless 1 0 0 3 1  PHQ - 2 Score 2 0 0 6 2  Altered sleeping 0   1 0  Tired, decreased energy 1   2 0  Change in appetite '1   2 1  '$ Feeling bad or failure about yourself  0   0 0  Trouble concentrating 1   1 0  Moving slowly or fidgety/restless 1   2 0  Suicidal thoughts    0 0  PHQ-9 Score '6   14 3  '$ Difficult doing work/chores    Somewhat difficult     PHYSICAL EXAM   HEENT:  Normocephalic, atraumatic. The mucous membranes are moist. The superficial temporal arteries are without ropiness or tenderness. Cardiovascular: Regular rate and rhythm. Lungs: Clear to auscultation bilaterally. Neck: There are no carotid bruits noted bilaterally.  NEUROLOGICAL:    12/15/2014   11:19 AM  Montreal Cognitive Assessment   Visuospatial/ Executive (0/5) 3  Naming (0/3) 3  Attention: Read list of digits (0/2) 2  Attention: Read list of letters (0/1) 1  Attention: Serial 7 subtraction starting at 100 (0/3) 3  Language: Repeat phrase (0/2) 2  Language : Fluency (0/1) 0  Abstraction (0/2) 0  Delayed Recall (0/5) 0   Orientation (0/6) 6  Total 20  Adjusted Score (based on education) 21       10/02/2016   11:10 AM  MMSE - Mini Mental State Exam  Orientation to time 5  Orientation to Place 5  Registration 3  Attention/ Calculation 0  Recall 1  Recall-comments pt was unable to recall 2 of 3 words  Language- name 2 objects 0  Language- repeat 1  Language- follow 3 step command 3  Language- read & follow direction 0  Write a sentence 0  Copy design 0  Total score 18     Orientation:  Alert and oriented to person, place and time. No aphasia or dysarthria. Fund of knowledge is appropriate. Recent memory impaired and remote memory intact.  Attention and concentration are normal.  Able to name objects and repeat phrases. Delayed recall   / Cranial nerves: There is good facial symmetry. Extraocular muscles are intact and visual fields are full to confrontational testing. Speech is fluent and clear. Soft palate rises symmetrically and there is no tongue deviation. Hearing is intact to conversational tone. Tone: Tone is good throughout. Sensation: Sensation is intact to light touch and pinprick throughout. Vibration is intact at the bilateral big toe.There is no extinction with double simultaneous stimulation. There is no sensory dermatomal level identified. Coordination: The patient has no difficulty with RAM's or FNF bilaterally. Normal finger to nose  Motor: Strength is 5/5 in the bilateral upper and lower extremities. There is no pronator drift. There are no fasciculations noted. DTR's: Deep tendon reflexes are 2/4 at the bilateral biceps, triceps, brachioradialis, patella and achilles.  Plantar responses are downgoing bilaterally. Gait and Station: The patient is able to ambulate without difficulty.The patient is able to heel toe walk without any difficulty.The patient is able to ambulate in a tandem fashion. The patient is able to stand in the Romberg position.     Thank you for allowing [place] the  opportunity to participate in the care of this nice patient. Please do not hesitate to contact us for any questions or concerns.   Total time spent on today's visit was 60 minutes dedicated to this patient today, preparing to see patient, examining the patient, ordering tests and/or medications and counseling the patient, documenting clinical information in the EHR or other health record, independently interpreting results and communicating results to the patient/family, discussing treatment and goals, answering patient's questions and coordinating care.  Cc:  Venia Carbon, MD  Sharene Butters 10/23/2021 7:21 AM

## 2021-10-23 NOTE — Telephone Encounter (Signed)
Spoke to pt

## 2021-10-23 NOTE — Telephone Encounter (Signed)
Patient and Museum/gallery conservator (patient daughter) state they went to see Neurologist patient was referred to by Dr. Silvio Pate, and they discussed her seeing a Psychiatrist. Patient walked in wanting to get a referral for a psychiatrist in [place]. Patient would like for one to be sent over, and for someone to call her once its done. Thank you.   Callback: [phone]

## 2021-10-23 NOTE — Patient Instructions (Signed)
It was a pleasure to see you today at our office.   Recommendations:  Neurocognitive evaluation at our office MRI of the brain, the radiology office will call you to arrange you appointment Recommend to establish care with a psychiatrist and behavioral therapist  [facility] Follow up in 1 month  Whom to call:  Memory  decline, memory medications: Call our office [phone]   For psychiatric meds, mood meds: Please have your primary care physician manage these medications.   Counseling regarding caregiver distress, including caregiver depression, anxiety and issues regarding community resources, adult day care programs, adult living facilities, or memory care questions:   Feel free to contact [place], Social Worker at [phone]   For assessment of decision of mental capacity and competency:  Call Dr. Anthoney Harada, geriatric psychiatrist at [phone]  For guidance in geriatric dementia issues please call [facility] [phone]  For guidance regarding [facility] and if placement were needed at the facility, contact Arnell Asal, Social Worker tel: [phone]  If you have any severe symptoms of a stroke, or other severe issues such as confusion,severe chills or fever, etc call 911 or go to the ER as you may need to be evaluated further   Feel free to visit Facebook page " [email or web address]" for tips of how to care for people with memory problems.   Consider [facility]  [place], [place] [address] [phone]  Hours of Operation Mondays to Thursdays: 8 am to 8 pm,Fridays: 9 am to 8 pm, Saturdays: 9 am to 1 pm Sundays: Closed  [email or web address]    RECOMMENDATIONS FOR ALL PATIENTS WITH MEMORY PROBLEMS: 1. Continue to exercise (Recommend 30 minutes of walking everyday, or 3 hours every week) 2. Increase social  interactions - continue going to [place] and enjoy social gatherings with friends and family 3. Eat healthy, avoid fried foods and eat more fruits and vegetables 4. Maintain adequate blood pressure, blood sugar, and blood cholesterol level. Reducing the risk of stroke and cardiovascular disease also helps promoting better memory. 5. Avoid stressful situations. Live a simple life and avoid aggravations. Organize your time and prepare for the next day in anticipation. 6. Sleep well, avoid any interruptions of sleep and avoid any distractions in the bedroom that may interfere with adequate sleep quality 7. Avoid sugar, avoid sweets as there is a strong link between excessive sugar intake, diabetes, and cognitive impairment We discussed the Mediterranean diet, which has been shown to help patients reduce the risk of progressive memory disorders and reduces cardiovascular risk. This includes eating fish, eat fruits and green leafy vegetables, nuts like almonds and hazelnuts, walnuts, and also use olive oil. Avoid fast foods and fried foods as much as possible. Avoid sweets and sugar as sugar use has been linked to worsening of memory function.  There is always a concern of gradual progression of memory problems. If this is the case, then we may need to adjust level of care according to patient needs. Support, both to the patient and caregiver, should then be put into place.      You have been referred for a neuropsychological evaluation (i.e., evaluation of memory and thinking abilities). Please bring someone with you to this appointment if possible, as it is helpful for the doctor to hear from both you and another adult who knows you well. Please bring eyeglasses and hearing aids if you wear them.    The evaluation will take approximately 3  hours and has two parts:   The first part is a clinical interview with the neuropsychologist (Dr. Melvyn Novas or Dr. Nicole Kindred). During the interview, the neuropsychologist  will speak with you and the individual you brought to the appointment.    The second part of the evaluation is testing with the doctor's technician Hinton Dyer or Maudie Mercury). During the testing, the technician will ask you to remember different types of material, solve problems, and answer some questionnaires. Your family member will not be present for this portion of the evaluation.   Please note: We must reserve several hours of the neuropsychologist's time and the psychometrician's time for your evaluation appointment. As such, there is a No-Show fee of $100. If you are unable to attend any of your appointments, please contact our office as soon as possible to reschedule.    FALL PRECAUTIONS: Be cautious when walking. Scan the area for obstacles that may increase the risk of trips and falls. When getting up in the mornings, sit up at the edge of the bed for a few minutes before getting out of bed. Consider elevating the bed at the head end to avoid drop of blood pressure when getting up. Walk always in a well-lit room (use night lights in the walls). Avoid area rugs or power cords from appliances in the middle of the walkways. Use a walker or a cane if necessary and consider physical therapy for balance exercise. Get your eyesight checked regularly.  FINANCIAL OVERSIGHT: Supervision, especially oversight when making financial decisions or transactions is also recommended.  HOME SAFETY: Consider the safety of the kitchen when operating appliances like stoves, microwave oven, and blender. Consider having supervision and share cooking responsibilities until no longer able to participate in those. Accidents with firearms and other hazards in the house should be identified and addressed as well.   ABILITY TO BE LEFT ALONE: If patient is unable to contact 911 operator, consider using LifeLine, or when the need is there, arrange for someone to stay with patients. Smoking is a fire hazard, consider supervision or  cessation. Risk of wandering should be assessed by caregiver and if detected at any point, supervision and safe proof recommendations should be instituted.  MEDICATION SUPERVISION: Inability to self-administer medication needs to be constantly addressed. Implement a mechanism to ensure safe administration of the medications.   DRIVING: Regarding driving, in patients with progressive memory problems, driving will be impaired. We advise to have someone else do the driving if trouble finding directions or if minor accidents are reported. Independent driving assessment is available to determine safety of driving.   If you are interested in the driving assessment, you can contact the following:  [employer] in [place]  [place] [phone] or [phone]    [place] refers to food and lifestyle choices that are based on the traditions of countries located on the [employer]. This way of eating has been shown to help prevent certain conditions and improve outcomes for people who have chronic diseases, like kidney disease and heart disease. What are tips for following this plan? Lifestyle  Cook and eat meals together with your family, when possible. Drink enough fluid to keep your urine clear or pale yellow. Be physically active every day. This includes: Aerobic exercise like running or swimming. Leisure activities like gardening, walking, or housework. Get 7-8 hours of sleep each night. If recommended by your health care provider,  drink red wine in moderation. This means 1 glass a day for nonpregnant women and 2 glasses a day for men. A glass of wine equals 5 oz (150 mL). Reading food labels  Check the serving size of packaged foods. For foods such as rice and pasta, the serving size refers to the amount of cooked product, not  dry. Check the total fat in packaged foods. Avoid foods that have saturated fat or trans fats. Check the ingredients list for added sugars, such as corn syrup. Shopping  At the grocery store, buy most of your food from the areas near the walls of the store. This includes: Fresh fruits and vegetables (produce). Grains, beans, nuts, and seeds. Some of these may be available in unpackaged forms or large amounts (in bulk). Fresh seafood. Poultry and eggs. Low-fat dairy products. Buy whole ingredients instead of prepackaged foods. Buy fresh fruits and vegetables in-season from local farmers markets. Buy frozen fruits and vegetables in resealable bags. If you do not have access to quality fresh seafood, buy precooked frozen shrimp or canned fish, such as tuna, salmon, or sardines. Buy small amounts of raw or cooked vegetables, salads, or olives from the deli or salad bar at your store. Stock your pantry so you always have certain foods on hand, such as olive oil, canned tuna, canned tomatoes, rice, pasta, and beans. Cooking  Cook foods with extra-virgin olive oil instead of using butter or other vegetable oils. Have meat as a side dish, and have vegetables or grains as your main dish. This means having meat in small portions or adding small amounts of meat to foods like pasta or stew. Use beans or vegetables instead of meat in common dishes like chili or lasagna. Experiment with different cooking methods. Try roasting or broiling vegetables instead of steaming or sauteing them. Add frozen vegetables to soups, stews, pasta, or rice. Add nuts or seeds for added healthy fat at each meal. You can add these to yogurt, salads, or vegetable dishes. Marinate fish or vegetables using olive oil, lemon juice, garlic, and fresh herbs. Meal planning  Plan to eat 1 vegetarian meal one day each week. Try to work up to 2 vegetarian meals, if possible. Eat seafood 2 or more times a week. Have healthy snacks  readily available, such as: Vegetable sticks with hummus. Greek yogurt. Fruit and nut trail mix. Eat balanced meals throughout the week. This includes: Fruit: 2-3 servings a day Vegetables: 4-5 servings a day Low-fat dairy: 2 servings a day Fish, poultry, or lean meat: 1 serving a day Beans and legumes: 2 or more servings a week Nuts and seeds: 1-2 servings a day Whole grains: 6-8 servings a day Extra-virgin olive oil: 3-4 servings a day Limit red meat and sweets to only a few servings a month What are my food choices? Mediterranean diet Recommended Grains: Whole-grain pasta. Brown rice. Bulgar wheat. Polenta. Couscous. Whole-wheat bread. [place]. Vegetables: Artichokes. Beets. Broccoli. Cabbage. Carrots. Eggplant. Green beans. Chard. Kale. Spinach. Onions. Leeks. Peas. Squash. Tomatoes. Peppers. Radishes. Fruits: Apples. Apricots. Avocado. Berries. Bananas. Cherries. Dates. Figs. Grapes. Lemons. Melon. Oranges. Peaches. Plums. Pomegranate. Meats and other protein foods: Beans. Almonds. Sunflower seeds. Pine nuts. Peanuts. [place]. Salmon. Scallops. Shrimp. [place]. Tilapia. Clams. Oysters. Eggs. Dairy: Low-fat milk. Cheese. Greek yogurt. Beverages: Water. Red wine. Herbal tea. Fats and oils: Extra virgin olive oil. Avocado oil. Grape seed oil. Sweets and desserts: [place] yogurt with honey. Baked apples. Poached pears. Trail mix. Seasoning and other foods: Basil. Cilantro.  Coriander. Cumin. Mint. Parsley. Sage. Rosemary. Tarragon. Garlic. Oregano. Thyme. Pepper. Balsalmic vinegar. Tahini. Hummus. Tomato sauce. Olives. Mushrooms. Limit these Grains: Prepackaged pasta or rice dishes. Prepackaged cereal with added sugar. Vegetables: Deep fried potatoes (french fries). Fruits: Fruit canned in syrup. Meats and other protein foods: Beef. Pork. Lamb. Poultry with skin. Hot dogs. [place] Salines. Dairy: Ice cream. Sour cream. Whole milk. Beverages: Juice. Sugar-sweetened soft drinks. Beer. Liquor and  spirits. Fats and oils: Butter. Canola oil. Vegetable oil. Beef fat (tallow). Lard. Sweets and desserts: Cookies. Cakes. Pies. Candy. Seasoning and other foods: Mayonnaise. Premade sauces and marinades. The items listed may not be a complete list. Talk with your dietitian about what dietary choices are right for you. Summary The Mediterranean diet includes both food and lifestyle choices. Eat a variety of fresh fruits and vegetables, beans, nuts, seeds, and whole grains. Limit the amount of red meat and sweets that you eat. Talk with your health care provider about whether it is safe for you to drink red wine in moderation. This means 1 glass a day for nonpregnant women and 2 glasses a day for men. A glass of wine equals 5 oz (150 mL). This information is not intended to replace advice given to you by your health care provider. Make sure you discuss any questions you have with your health care provider. Document Released: 11/29/2015 Document Revised: 01/01/2016 Document Reviewed: 11/29/2015 Elsevier Interactive Patient Education  2017 [employer].

## 2021-10-23 NOTE — Telephone Encounter (Signed)
Please let her know referral placed---it may take a while to get this set up

## 2021-10-24 ENCOUNTER — Encounter: Payer: Self-pay | Admitting: *Deleted

## 2021-11-01 ENCOUNTER — Ambulatory Visit
Admission: RE | Admit: 2021-11-01 | Discharge: 2021-11-01 | Disposition: A | Discharge status: Home / Self Care | Payer: PPO | Source: Ambulatory Visit | Attending: Physician Assistant | Admitting: Physician Assistant

## 2021-11-01 DIAGNOSIS — R413 Other amnesia: Secondary | ICD-10-CM | POA: Diagnosis not present

## 2021-11-01 DIAGNOSIS — I6381 Other cerebral infarction due to occlusion or stenosis of small artery: Secondary | ICD-10-CM | POA: Diagnosis not present

## 2021-11-01 DIAGNOSIS — I6782 Cerebral ischemia: Secondary | ICD-10-CM | POA: Diagnosis not present

## 2021-11-01 DIAGNOSIS — G319 Degenerative disease of nervous system, unspecified: Secondary | ICD-10-CM | POA: Diagnosis not present

## 2021-11-03 DIAGNOSIS — I6381 Other cerebral infarction due to occlusion or stenosis of small artery: Secondary | ICD-10-CM

## 2021-11-03 HISTORY — DX: Other cerebral infarction due to occlusion or stenosis of small artery: I63.81

## 2021-11-04 NOTE — Progress Notes (Signed)
I left message to return call, voicemail at 10:22am 11/04/2021

## 2021-11-04 NOTE — Progress Notes (Signed)
Please inform patient MRI brain is negative for acute findings, they are chronic changes in the vessels and also some atrophy in the brain that may explain her memory issues. Make sure to take care of all of the cardiovascular risk factors including a baby aspirin daily, thanks

## 2021-11-05 DIAGNOSIS — N281 Cyst of kidney, acquired: Secondary | ICD-10-CM | POA: Diagnosis not present

## 2021-11-05 DIAGNOSIS — E1122 Type 2 diabetes mellitus with diabetic chronic kidney disease: Secondary | ICD-10-CM | POA: Diagnosis not present

## 2021-11-05 DIAGNOSIS — N1832 Chronic kidney disease, stage 3b: Secondary | ICD-10-CM | POA: Diagnosis not present

## 2021-11-05 DIAGNOSIS — R8290 Unspecified abnormal findings in urine: Secondary | ICD-10-CM | POA: Diagnosis not present

## 2021-11-05 DIAGNOSIS — I10 Essential (primary) hypertension: Secondary | ICD-10-CM | POA: Diagnosis not present

## 2021-11-05 DIAGNOSIS — F319 Bipolar disorder, unspecified: Secondary | ICD-10-CM | POA: Diagnosis not present

## 2021-11-05 DIAGNOSIS — R809 Proteinuria, unspecified: Secondary | ICD-10-CM | POA: Diagnosis not present

## 2021-11-05 DIAGNOSIS — N200 Calculus of kidney: Secondary | ICD-10-CM | POA: Diagnosis not present

## 2021-11-05 HISTORY — DX: Cyst of kidney, acquired: N28.1

## 2021-11-15 ENCOUNTER — Other Ambulatory Visit: Payer: Self-pay | Admitting: Internal Medicine

## 2021-11-27 ENCOUNTER — Encounter: Payer: Self-pay | Admitting: Physician Assistant

## 2021-11-27 ENCOUNTER — Ambulatory Visit: Payer: PPO | Admitting: Physician Assistant

## 2021-11-27 VITALS — BP 181/80 | HR 64 | Resp 18 | Ht 66.0 in | Wt 134.0 lb

## 2021-11-27 DIAGNOSIS — G3184 Mild cognitive impairment, so stated: Secondary | ICD-10-CM

## 2021-11-27 MED ORDER — MEMANTINE HCL 5 MG PO TABS: ORAL_TABLET | ORAL | 11 refills | Status: DC

## 2021-11-27 NOTE — Progress Notes (Signed)
Assessment/Plan:   Mild cognitive impairment likely due to Alzheimer's disease  Lori Phillips is a very pleasant 66 y.o. RH female  with  a history of hypertension, hyperlipidemia, DM2, anxiety with panic attacks, bipolar disorder, depression, CAD, fibromyalgia, history of CKD, hypothyroidism, rheumatoid arthritis, B12 deficiency, hepatomegaly.  Seen today for evaluation of memory loss.  Prior MRI in February 2022 was remarkable for generalized advanced for age atrophy, and mild chronic small vessel disease.  Last MoCA was 23/30.   Recommendations:  Patient has an appointment for neurocognitive testing Continue B12 supplementation Recommend good control of cardiovascular risk factors Increase activity, consider attending senior centers Start memantine 5 mg, take 1 tab at night for 2 weeks, then increase to 1 tab twice daily if tolerated Follow-up in 6 months  Case discussed with Dr. Delice Lesch who agrees with the plan    Subjective:    This patient is accompanied in the office by her daughter and granddaughter who supplements the history.  Previous records as well as any outside records available were reviewed prior to todays visit.    Any changes in memory since last visit?  She denies any new changes. Patient lives with: Daughter and granddaughter repeats oneself?  Endorsed Disoriented when walking into a room?  Patient denies   Leaving objects in unusual places?  Patient denies   Ambulates  with difficulty?   Patient denies   Recent falls?  Patient denies   Any head injuries?  Patient denies   History of seizures?   Patient denies   Wandering behavior?  Patient denies   Patient drives?   Patient no longer drives  Any mood changes since last visit?  Patient denies.  "They may be having some problems with my mood "she is on Wellbutrin, which she seems to tolerate well. Any worsening depression?:  Patient denies   Hallucinations?  Patient denies   Paranoia?  She has a history  of borderline paranoid schizophrenic. Patient reports that female  sleeps well without vivid dreams, REM behavior or sleepwalking   History of sleep apnea?  Patient denies   Any hygiene concerns?  Patient denies   Independent of bathing and dressing?  Endorsed  Does the patient needs help with medications?  Denies Who is in charge of the finances?   is in charge    Any changes in appetite?  Patient denies   Patient have trouble swallowing? Patient denies   Does the patient cook?  Patient denies   Any kitchen accidents such as leaving the stove on? Patient denies   Any headaches?  Patient denies   Double vision? Patient denies   Any focal numbness or tingling?  Patient denies   Chronic back pain Patient denies   Unilateral weakness?  Patient denies   Any tremors?  "Only when nervous " Any history of anosmia?  Patient denies   Any incontinence of urine?  "Hard to hold if I have to go".  Needs pads at times Any bowel dysfunction?   Patient denies        Initial visit 10/23/2021  How long did patient have memory difficulties?  Patient reports that her memory changes have been present for about 1 year, getting harder to recall new information, new names.   Patient lives with: Daughter and granddaughter. repeats oneself?  "A lot "-repeats questions and comments. She is aware of what is going on. Daughter notes that she can asked the same questions 6 times in 2 hours.  Of  note, the daughter reports that the patient has been doing research on dementia,  since her past testing was negative for date.  She has been reading just about every pamphlet she can.  She feels like her mother is manipulating her diagnosis. Disoriented when walking into a room?  Patient denies   Leaving objects in unusual places?  Patient denies   Ambulates  with difficulty?  Her daughter states that the patient "may drag her feet to go to the grocery store, but if she has to take a check to the bank, then she runs ".   Recent  falls?  Patient denies   Any head injuries?  Patient denies   History of seizures?   Patient denies   Wandering behavior?  Patient denies   Patient drives?   Patient no longer drives. "I just don't drive" "she is not a good driver, she pays attention to everything but to the road "-daughter says    Any mood changes such irritability agitation?  "They may be having some problems with my mood "-daughter endorses " It is better with Wellbutrin, more alert and present ".  She has a history of bipolar disorder, but for the last 6 years she has not been on medication because she refuses to go to the doctor, and in the interim, "she expects people to do things for her at all times ", especially her daughter.  Any history of depression?:  Has a history of depression.  She does not like to interact with people, she has refused several times going to senior centers.  When the pamphlets arrive by mail, she hides them so no one can see them-daughter says. Hallucinations?  Patient denies   Paranoia?  The patient has had a diagnosis of borderline paranoid-schizophrenic (going to [place])  Patient reports that he sleeps well without vivid dreams, REM behavior or sleepwalking    History of sleep apnea?  In the past she used in the past her CPAP, but that since her sinus surgery, she does not need it anymore. Any hygiene concerns?  Endorsed. She doesn't shower enough.  She has bed sores.  She "fakes "her showers especially during the last year Independent of bathing and dressing?  She her daughter has to help her Does the patient needs help with medications?  Daughter is in charge "as she was not doing it right "-daughter said..  Who is in charge of the finances is in charge Any changes in appetite?  Patient denies.  She was losing weight to trulicity and gabapentin, she is off of those medications, and has increased her weight Patient have trouble swallowing? Patient denies  needs a new esophageal dilatation. Does the  patient cook?  Patient denies   Any kitchen accidents such as leaving the stove on? Patient denies   Any headaches?  Patient denies   The double vision? Patient denies   Any focal numbness or tingling?  Patient denies   Chronic back pain Patient denies   Unilateral weakness?  Patient denies   Any tremors?  Patient denies  when nervous or tired or sugars low  Any history of anosmia?  Patient denies   Any incontinence of urine?  Needs diapers "hard to hold it if I had to go ".  Her daughter states that she feels up to the point that she has to go to the bathroom. Any bowel dysfunction?   She has chronic diarrhea.  She also takes Alka-Seltzer daily. History of heavy alcohol intake?  Patient denies   History of heavy tobacco use?  Patient denies    Pertinent labs March 2023 TSH normal 1.22, T4 0.99, LFT normal, creatinine 1.4, CBC normal, LDL normal, triglycerides elevated 202   MRI of the brain February 2022 without acute or reversible finding, generalized brain atrophy, advanced for age, and further progressive since 2016.  Mild chronic small vessel change of the white matter.  No abnormalities in brainstem or cerebellum.  No hydrocephalus  PREVIOUS MEDICATIONS:   CURRENT MEDICATIONS:  Outpatient Encounter Medications as of 11/27/2021  Medication Sig   acetaminophen (TYLENOL) 650 MG CR tablet Take 650 mg by mouth 3 (three) times daily as needed for pain.   buPROPion (WELLBUTRIN XL) 300 MG 24 hr tablet Take 1 tablet (300 mg total) by mouth daily.   diltiazem (CARDIZEM CD) 180 MG 24 hr capsule TAKE 1 CAPSULE(180 MG) BY MOUTH DAILY   divalproex (DEPAKOTE) 500 MG DR tablet Take 1 tablet (500 mg total) by mouth 2 (two) times daily.   glipiZIDE (GLUCOTROL) 5 MG tablet TAKE 1 TABLET(5 MG) BY MOUTH DAILY BEFORE BREAKFAST   glucose blood (ONETOUCH VERIO) test strip Use to check blood sugar once a day. Dx Code E11.40   levothyroxine (SYNTHROID) 100 MCG tablet TAKE 1 TABLET BY MOUTH DAILY BEFORE  BREAKFAST   lisinopril (ZESTRIL) 5 MG tablet TAKE 1 TABLET(5 MG) BY MOUTH DAILY   metFORMIN (GLUCOPHAGE) 500 MG tablet TAKE 1 TABLET(500 MG) BY MOUTH TWICE DAILY WITH A MEAL   OneTouch Delica Lancets 85I MISC 1 each by Does not apply route daily. Use to obtain blood sample for blood sugar once a day. Dx Code E11.40   pantoprazole (PROTONIX) 40 MG tablet TAKE 1 TABLET(40 MG) BY MOUTH DAILY   rosuvastatin (CRESTOR) 20 MG tablet TAKE 1 TABLET BY MOUTH DAILY   vitamin B-12 (CYANOCOBALAMIN) 500 MCG tablet Take 500 mcg by mouth at bedtime.    VITAMIN D PO Take by mouth.   [DISCONTINUED] TAZTIA XT 180 MG 24 hr capsule TAKE ONE CAPSULE BY MOUTH EVERY DAY   No facility-administered encounter medications on file as of 11/27/2021.       10/02/2016   11:10 AM  MMSE - Mini Mental State Exam  Orientation to time 5  Orientation to Place 5  Registration 3  Attention/ Calculation 0  Recall 1  Recall-comments pt was unable to recall 2 of 3 words  Language- name 2 objects 0  Language- repeat 1  Language- follow 3 step command 3  Language- read & follow direction 0  Write a sentence 0  Copy design 0  Total score 18      10/23/2021   12:00 PM 12/15/2014   11:19 AM  Montreal Cognitive Assessment   Visuospatial/ Executive (0/5) 3 3  Naming (0/3) 3 3  Attention: Read list of digits (0/2) 2 2  Attention: Read list of letters (0/1) 1 1  Attention: Serial 7 subtraction starting at 100 (0/3) 3 3  Language: Repeat phrase (0/2) 2 2  Language : Fluency (0/1) 0 0  Abstraction (0/2) 2 0  Delayed Recall (0/5) 0 0  Orientation (0/6) 6 6  Total 22 20  Adjusted Score (based on education) 23 21    Objective:     PHYSICAL EXAMINATION:    VITALS:  There were no vitals filed for this visit.  GEN:  The patient appears stated age and is in NAD. HEENT:  Normocephalic, atraumatic.   Neurological examination:  General: NAD, well-groomed, appears  stated age. Orientation: The patient is alert. Oriented to  person, place and date Cranial nerves: There is good facial symmetry.The speech is fluent and clear. No aphasia or dysarthria. Fund of knowledge is appropriate. Recent and remote memory are impaired. Attention and concentration are normal.  Able to name objects and repeat phrases.  Hearing is intact to conversational tone.    Sensation: Sensation is intact to light touch throughout Motor: Strength is at least antigravity x4. Tremors: none  DTR's 2/4 in UE/LE     Movement examination: Tone: There is normal tone in the UE/LE Abnormal movements:  no tremor.  No myoclonus.  No asterixis.   Coordination:  There is no decremation with RAM's. Normal finger to nose  Gait and Station: The patient has no difficulty arising out of a deep-seated chair without the use of the hands. The patient's stride length is good.  Gait is cautious and narrow.    Thank you for allowing [place] the opportunity to participate in the care of this nice patient. Please do not hesitate to contact us for any questions or concerns.   Total time spent on today's visit was 28  minutes dedicated to this patient today, preparing to see patient, examining the patient, ordering tests and/or medications and counseling the patient, documenting clinical information in the EHR or other health record, independently interpreting results and communicating results to the patient/family, discussing treatment and goals, answering patient's questions and coordinating care.  Cc:  Venia Carbon, MD  Sharene Butters 11/27/2021 12:37 PM

## 2021-11-27 NOTE — Patient Instructions (Signed)
It was a pleasure to see you today at our office.   Recommendations:  Neurocognitive evaluation at our office Recommend to establish care with a psychiatrist and behavioral therapist  Attend senior center Start Memantine 5 mg tablets.  Take 1 tablet at bedtime for 2 weeks, then 1 tablet twice daily.  Follow up in 6 month  Whom to call:  Memory  decline, memory medications: Call our office [phone]   For psychiatric meds, mood meds: Please have your primary care physician manage these medications.   Counseling regarding caregiver distress, including caregiver depression, anxiety and issues regarding community resources, adult day care programs, adult living facilities, or memory care questions:   Feel free to contact [place], Social Worker at [phone]   For assessment of decision of mental capacity and competency:  Call Dr. Anthoney Harada, geriatric psychiatrist at [phone]  For guidance in geriatric dementia issues please call [facility] [phone]  For guidance regarding [facility] and if placement were needed at the facility, contact Arnell Asal, Social Worker tel: [phone]  If you have any severe symptoms of a stroke, or other severe issues such as confusion,severe chills or fever, etc call 911 or go to the ER as you may need to be evaluated further   Feel free to visit Facebook page " [facility]" for tips of how to care for people with memory problems.   Consider [facility]  [place], [place] [address] [phone]  Hours of Operation Mondays to Thursdays: 8 am to 8 pm,Fridays: 9 am to 8 pm, Saturdays: 9 am to 1 pm Sundays: Closed  [email or web address]    RECOMMENDATIONS FOR ALL PATIENTS WITH MEMORY PROBLEMS: 1. Continue to exercise (Recommend 30 minutes of walking everyday, or 3 hours every week) 2.  Increase social interactions - continue going to [employer] and enjoy social gatherings with friends and family 3. Eat healthy, avoid fried foods and eat more fruits and vegetables 4. Maintain adequate blood pressure, blood sugar, and blood cholesterol level. Reducing the risk of stroke and cardiovascular disease also helps promoting better memory. 5. Avoid stressful situations. Live a simple life and avoid aggravations. Organize your time and prepare for the next day in anticipation. 6. Sleep well, avoid any interruptions of sleep and avoid any distractions in the bedroom that may interfere with adequate sleep quality 7. Avoid sugar, avoid sweets as there is a strong link between excessive sugar intake, diabetes, and cognitive impairment We discussed the Mediterranean diet, which has been shown to help patients reduce the risk of progressive memory disorders and reduces cardiovascular risk. This includes eating fish, eat fruits and green leafy vegetables, nuts like almonds and hazelnuts, walnuts, and also use olive oil. Avoid fast foods and fried foods as much as possible. Avoid sweets and sugar as sugar use has been linked to worsening of memory function.  There is always a concern of gradual progression of memory problems. If this is the case, then we may need to adjust level of care according to patient needs. Support, both to the patient and caregiver, should then be put into place.      You have been referred for a neuropsychological evaluation (i.e., evaluation of memory and thinking abilities). Please bring someone with you to this appointment if possible, as it is helpful for the doctor to hear from both you and another adult who knows you well. Please bring eyeglasses and hearing aids if you wear them.  The evaluation will take approximately 3 hours and has two parts:   The first part is a clinical interview with the neuropsychologist (Dr. Melvyn Novas or Dr. Nicole Kindred). During the interview, the  neuropsychologist will speak with you and the individual you brought to the appointment.    The second part of the evaluation is testing with the doctor's technician Hinton Dyer or Maudie Mercury). During the testing, the technician will ask you to remember different types of material, solve problems, and answer some questionnaires. Your family member will not be present for this portion of the evaluation.   Please note: We must reserve several hours of the neuropsychologist's time and the psychometrician's time for your evaluation appointment. As such, there is a No-Show fee of $100. If you are unable to attend any of your appointments, please contact our office as soon as possible to reschedule.    FALL PRECAUTIONS: Be cautious when walking. Scan the area for obstacles that may increase the risk of trips and falls. When getting up in the mornings, sit up at the edge of the bed for a few minutes before getting out of bed. Consider elevating the bed at the head end to avoid drop of blood pressure when getting up. Walk always in a well-lit room (use night lights in the walls). Avoid area rugs or power cords from appliances in the middle of the walkways. Use a walker or a cane if necessary and consider physical therapy for balance exercise. Get your eyesight checked regularly.  FINANCIAL OVERSIGHT: Supervision, especially oversight when making financial decisions or transactions is also recommended.  HOME SAFETY: Consider the safety of the kitchen when operating appliances like stoves, microwave oven, and blender. Consider having supervision and share cooking responsibilities until no longer able to participate in those. Accidents with firearms and other hazards in the house should be identified and addressed as well.   ABILITY TO BE LEFT ALONE: If patient is unable to contact 911 operator, consider using LifeLine, or when the need is there, arrange for someone to stay with patients. Smoking is a fire hazard, consider  supervision or cessation. Risk of wandering should be assessed by caregiver and if detected at any point, supervision and safe proof recommendations should be instituted.  MEDICATION SUPERVISION: Inability to self-administer medication needs to be constantly addressed. Implement a mechanism to ensure safe administration of the medications.   DRIVING: Regarding driving, in patients with progressive memory problems, driving will be impaired. We advise to have someone else do the driving if trouble finding directions or if minor accidents are reported. Independent driving assessment is available to determine safety of driving.   If you are interested in the driving assessment, you can contact the following:  [employer] in [place]  [place] [phone] or [phone]    [place] refers to food and lifestyle choices that are based on the traditions of countries located on the [employer]. This way of eating has been shown to help prevent certain conditions and improve outcomes for people who have chronic diseases, like kidney disease and heart disease. What are tips for following this plan? Lifestyle  Cook and eat meals together with your family, when possible. Drink enough fluid to keep your urine clear or pale yellow. Be physically active every day. This includes: Aerobic exercise like running or swimming. Leisure activities like gardening, walking, or housework. Get 7-8 hours of sleep each night. If  recommended by your health care provider, drink red wine in moderation. This means 1 glass a day for nonpregnant women and 2 glasses a day for men. A glass of wine equals 5 oz (150 mL). Reading food labels  Check the serving size of packaged foods. For foods such as rice and pasta, the serving size refers to the amount of cooked  product, not dry. Check the total fat in packaged foods. Avoid foods that have saturated fat or trans fats. Check the ingredients list for added sugars, such as corn syrup. Shopping  At the grocery store, buy most of your food from the areas near the walls of the store. This includes: Fresh fruits and vegetables (produce). Grains, beans, nuts, and seeds. Some of these may be available in unpackaged forms or large amounts (in bulk). Fresh seafood. Poultry and eggs. Low-fat dairy products. Buy whole ingredients instead of prepackaged foods. Buy fresh fruits and vegetables in-season from local farmers markets. Buy frozen fruits and vegetables in resealable bags. If you do not have access to quality fresh seafood, buy precooked frozen shrimp or canned fish, such as tuna, salmon, or sardines. Buy small amounts of raw or cooked vegetables, salads, or olives from the deli or salad bar at your store. Stock your pantry so you always have certain foods on hand, such as olive oil, canned tuna, canned tomatoes, rice, pasta, and beans. Cooking  Cook foods with extra-virgin olive oil instead of using butter or other vegetable oils. Have meat as a side dish, and have vegetables or grains as your main dish. This means having meat in small portions or adding small amounts of meat to foods like pasta or stew. Use beans or vegetables instead of meat in common dishes like chili or lasagna. Experiment with different cooking methods. Try roasting or broiling vegetables instead of steaming or sauteing them. Add frozen vegetables to soups, stews, pasta, or rice. Add nuts or seeds for added healthy fat at each meal. You can add these to yogurt, salads, or vegetable dishes. Marinate fish or vegetables using olive oil, lemon juice, garlic, and fresh herbs. Meal planning  Plan to eat 1 vegetarian meal one day each week. Try to work up to 2 vegetarian meals, if possible. Eat seafood 2 or more times a week. Have  healthy snacks readily available, such as: Vegetable sticks with hummus. Greek yogurt. Fruit and nut trail mix. Eat balanced meals throughout the week. This includes: Fruit: 2-3 servings a day Vegetables: 4-5 servings a day Low-fat dairy: 2 servings a day Fish, poultry, or lean meat: 1 serving a day Beans and legumes: 2 or more servings a week Nuts and seeds: 1-2 servings a day Whole grains: 6-8 servings a day Extra-virgin olive oil: 3-4 servings a day Limit red meat and sweets to only a few servings a month What are my food choices? Mediterranean diet Recommended Grains: Whole-grain pasta. Brown rice. Bulgar wheat. Polenta. Couscous. Whole-wheat bread. [place]. Vegetables: Artichokes. Beets. Broccoli. Cabbage. Carrots. Eggplant. Green beans. Chard. Kale. Spinach. Onions. Leeks. Peas. Squash. Tomatoes. Peppers. Radishes. Fruits: Apples. Apricots. Avocado. Berries. Bananas. Cherries. Dates. Figs. Grapes. Lemons. Melon. Oranges. Peaches. Plums. Pomegranate. Meats and other protein foods: Beans. Almonds. Sunflower seeds. Pine nuts. Peanuts. [place]. Salmon. Scallops. Shrimp. [place]. Tilapia. Clams. Oysters. Eggs. Dairy: Low-fat milk. Cheese. Greek yogurt. Beverages: Water. Red wine. Herbal tea. Fats and oils: Extra virgin olive oil. Avocado oil. Grape seed oil. Sweets and desserts: [place] yogurt with honey. Baked apples. Poached pears. Trail mix.  Seasoning and other foods: Basil. Cilantro. Coriander. Cumin. Mint. Parsley. Sage. Rosemary. Tarragon. Garlic. Oregano. Thyme. Pepper. Balsalmic vinegar. Tahini. Hummus. Tomato sauce. Olives. Mushrooms. Limit these Grains: Prepackaged pasta or rice dishes. Prepackaged cereal with added sugar. Vegetables: Deep fried potatoes (french fries). Fruits: Fruit canned in syrup. Meats and other protein foods: Beef. Pork. Lamb. Poultry with skin. Hot dogs. [place] Salines. Dairy: Ice cream. Sour cream. Whole milk. Beverages: Juice. Sugar-sweetened soft drinks.  Beer. Liquor and spirits. Fats and oils: Butter. Canola oil. Vegetable oil. Beef fat (tallow). Lard. Sweets and desserts: Cookies. Cakes. Pies. Candy. Seasoning and other foods: Mayonnaise. Premade sauces and marinades. The items listed may not be a complete list. Talk with your dietitian about what dietary choices are right for you. Summary The Mediterranean diet includes both food and lifestyle choices. Eat a variety of fresh fruits and vegetables, beans, nuts, seeds, and whole grains. Limit the amount of red meat and sweets that you eat. Talk with your health care provider about whether it is safe for you to drink red wine in moderation. This means 1 glass a day for nonpregnant women and 2 glasses a day for men. A glass of wine equals 5 oz (150 mL). This information is not intended to replace advice given to you by your health care provider. Make sure you discuss any questions you have with your health care provider. Document Released: 11/29/2015 Document Revised: 01/01/2016 Document Reviewed: 11/29/2015 Elsevier Interactive Patient Education  2017 [employer].

## 2022-01-15 ENCOUNTER — Telehealth: Payer: Self-pay | Admitting: Internal Medicine

## 2022-01-15 NOTE — Telephone Encounter (Signed)
I will see her tomorrow as scheduled. We will need her consent though to do the urine drug testing

## 2022-01-15 NOTE — Telephone Encounter (Signed)
I spoke with  Museum/gallery conservator (DPR signed); Museum/gallery conservator and her daughter was out of town for wk or so and when returned [employer] found couple of bottles with white residue in them and when tested was + for amphetamines and opioids.at times pt appearing loopy and one and off violent but Lori Phillips not fearful.  Lori Phillips wants pt seen and tested to see if taking unprescribed drugs. Pt scheduled with Dr Silvio Pate on 01/16/22 at 2 pm. UC & ED precautions given and Lori Phillips voiced understanding.sending note to Dr Silvio Pate and Larene Beach CMA.

## 2022-01-15 NOTE — Telephone Encounter (Signed)
Pt's daughter, [name], called wanting to speak to shannon about her mother taking a drug test, to test for opioids & amphetamine. Spoke to Triage Nurse about what type of drug test do we do. Nurse is going to speak to Pt's daughter.

## 2022-01-16 ENCOUNTER — Ambulatory Visit (INDEPENDENT_AMBULATORY_CARE_PROVIDER_SITE_OTHER): Payer: PPO | Admitting: Internal Medicine

## 2022-01-16 ENCOUNTER — Encounter: Payer: Self-pay | Admitting: Internal Medicine

## 2022-01-16 VITALS — BP 138/84 | HR 68 | Temp 97.4°F | Ht 66.0 in | Wt 131.0 lb

## 2022-01-16 DIAGNOSIS — F310 Bipolar disorder, current episode hypomanic: Secondary | ICD-10-CM | POA: Diagnosis not present

## 2022-01-16 DIAGNOSIS — R4182 Altered mental status, unspecified: Secondary | ICD-10-CM | POA: Diagnosis not present

## 2022-01-16 NOTE — Assessment & Plan Note (Signed)
She denies this---and is willing to recheck urine drug screen Will do this now Discussed getting along, etc

## 2022-01-16 NOTE — Progress Notes (Signed)
Subjective:    Patient ID: Lori Phillips, female    DOB: 08-06-1955, 66 y.o.   MRN: [id number]  HPI Here with daughter due to concerns about drug abuse  Reviewed notes Daughter cleaned out everything while she was at [facility] for rehab 2016 (had been drug user before then) 3-4 nights ago--daughter found empty pill bottle stashed under papers in night stand Had residue---they tested with mobile kit and it was positive for amphetamines and opioids  Then got an over the counter drug test--and it was negative  Daughter is suspicious about her apparent high energy cleanups when they were out of town She has the money to get drugs----and does pull cash out at the store at times Daughter feels her behavior is erratic at times  Mood generally okay Some friction with 30 year old granddaughter  Current Outpatient Medications on File Prior to Visit  Medication Sig Dispense Refill   acetaminophen (TYLENOL) 650 MG CR tablet Take 650 mg by mouth 3 (three) times daily as needed for pain.     buPROPion (WELLBUTRIN XL) 300 MG 24 hr tablet Take 1 tablet (300 mg total) by mouth daily. 90 tablet 3   diltiazem (CARDIZEM CD) 180 MG 24 hr capsule TAKE 1 CAPSULE(180 MG) BY MOUTH DAILY 90 capsule 3   divalproex (DEPAKOTE) 500 MG DR tablet Take 1 tablet (500 mg total) by mouth 2 (two) times daily. 180 tablet 3   glipiZIDE (GLUCOTROL) 5 MG tablet TAKE 1 TABLET(5 MG) BY MOUTH DAILY BEFORE BREAKFAST 90 tablet 3   glucose blood (ONETOUCH VERIO) test strip Use to check blood sugar once a day. Dx Code E11.40 100 each 4   levothyroxine (SYNTHROID) 100 MCG tablet TAKE 1 TABLET BY MOUTH DAILY BEFORE BREAKFAST 90 tablet 3   lisinopril (ZESTRIL) 5 MG tablet TAKE 1 TABLET(5 MG) BY MOUTH DAILY 90 tablet 3   memantine (NAMENDA) 5 MG tablet Take 1 tablet (5 mg at night) for 2 weeks, then increase to 1 tablet (5 mg) twice a day 60 tablet 11   metFORMIN (GLUCOPHAGE) 500 MG tablet TAKE 1 TABLET(500 MG) BY MOUTH TWICE  DAILY WITH A MEAL 180 tablet 3   OneTouch Delica Lancets 78H MISC 1 each by Does not apply route daily. Use to obtain blood sample for blood sugar once a day. Dx Code E11.40 100 each 3   pantoprazole (PROTONIX) 40 MG tablet TAKE 1 TABLET(40 MG) BY MOUTH DAILY 90 tablet 3   rosuvastatin (CRESTOR) 20 MG tablet TAKE 1 TABLET BY MOUTH DAILY 90 tablet 3   vitamin B-12 (CYANOCOBALAMIN) 500 MCG tablet Take 500 mcg by mouth at bedtime.      VITAMIN D PO Take by mouth.     [DISCONTINUED] TAZTIA XT 180 MG 24 hr capsule TAKE ONE CAPSULE BY MOUTH EVERY DAY 90 capsule 0   No current facility-administered medications on file prior to visit.    Allergies  Allergen Reactions   Glyburide Other (See Comments)    Reaction:  Dizziness and confusion    Seroquel [Quetiapine Fumarate] Diarrhea, Nausea And Vomiting and Other (See Comments)    Reaction:  Confusion    Byetta 10 Mcg Pen [Exenatide] Nausea And Vomiting    Other reaction(s): Nausea And Vomiting    Past Medical History:  Diagnosis Date   Anxiety    with panic   Bipolar disorder ([place])    CAD (coronary artery disease)    multiple caths   Diabetes mellitus  Enlarged liver    Enlarged pituitary gland (HCC)    Fibromyalgia    GERD (gastroesophageal reflux disease)    Hyperlipidemia    Hypertension    Hypothyroidism    Kidney stones    Low back pain    Nephrolithiasis    Osteoarthritis    Osteoporosis    Parkinsonism ([place]) 2016   better after antipsychotics stopped   Rheumatoid arthritis ([place])    Scoliosis     Past Surgical History:  Procedure Laterality Date   BLADDER SURGERY     ?bladder sling    CARDIAC CATHETERIZATION     x3   CATARACT EXTRACTION, BILATERAL     COLONOSCOPY  multiple   2005 [place] not available-polyps x 3   COLONOSCOPY WITH PROPOFOL N/A 10/12/2015   Procedure: COLONOSCOPY WITH PROPOFOL;  Surgeon: Lollie Sails, MD;  Location: [facility] ENDOSCOPY;  Service: Endoscopy;  Laterality: N/A;    COLONOSCOPY WITH PROPOFOL N/A 04/29/2021   Procedure: COLONOSCOPY WITH PROPOFOL;  Surgeon: [name], MD;  Location: [facility] ENDOSCOPY;  Service: Endoscopy;  Laterality: N/A;  DM   ESOPHAGEAL DILATION     kidney stone removal     TONSILLECTOMY     VAGINAL HYSTERECTOMY      Family History  Problem Relation Age of Onset   Hypertension Mother    Diabetes Mother    Bipolar disorder Father    Hypertension Father    Colon cancer Paternal Aunt    Colon cancer Paternal Grandmother    Thyroid disease Neg Hx    Breast cancer Neg Hx     Social History   Socioeconomic History   Marital status: Widowed    Spouse name: Not on file   Number of children: 3   Years of education: 47   Highest education level: Not on file  Occupational History   Occupation: [place]: disabled  Tobacco Use   Smoking status: Never   Smokeless tobacco: Never  Vaping Use   Vaping Use: Never used  Substance and Sexual Activity   Alcohol use: Yes    Comment: very rare   Drug use: No   Sexual activity: Not on file  Other Topics Concern   Not on file  Social History Narrative   Daughter and granddaughter live with her      Has a living will- desires CPR but no prolonged life support if futile.   Daughter Museum/gallery conservator is health care POA   No tube feeds if cognitively unaware   Social Determinants of Health   Financial Resource Strain: Not on file  Food Insecurity: Not on file  Transportation Needs: Not on file  Physical Activity: Not on file  Stress: Not on file  Social Connections: Not on file  Intimate Partner Violence: Not on file   Review of Systems Sleeps okay--mostly Appetite is good Daughter and granddaughter live with her. Daughter does most of the upkeep    Objective:   Physical Exam Constitutional:      Appearance: Normal appearance.  Neurological:     Mental Status: She is alert.  Psychiatric:        Mood and Affect: Mood normal.         Behavior: Behavior normal.            Assessment & Plan:

## 2022-01-16 NOTE — Assessment & Plan Note (Signed)
Daughter is concerned that she "snaps"---and she notes this to some degree with granddaughter (at least provoked at times) Encouraged her to go ahead with the psychiatrist appointment

## 2022-01-17 LAB — DRUG MONITORING, PANEL 8 WITH CONFIRMATION, URINE
6 Acetylmorphine: NEGATIVE ng/mL (ref <10)
Alcohol Metabolites: NEGATIVE ng/mL (ref <500)
Amphetamines: NEGATIVE ng/mL (ref <500)
Benzodiazepines: NEGATIVE ng/mL (ref <100)
Buprenorphine, Urine: NEGATIVE ng/mL (ref <5)
Cocaine Metabolite: NEGATIVE ng/mL (ref <150)
Creatinine: 164.1 mg/dL (ref >=20.0)
MDMA: NEGATIVE ng/mL (ref <500)
Marijuana Metabolite: NEGATIVE ng/mL (ref <20)
Opiates: NEGATIVE ng/mL (ref <100)
Oxidant: NEGATIVE ug/mL (ref <200)
Oxycodone: NEGATIVE ng/mL (ref <100)
pH: 5.9 (ref 4.5–9.0)

## 2022-01-17 LAB — DM TEMPLATE

## 2022-01-20 ENCOUNTER — Encounter: Payer: Self-pay | Admitting: Internal Medicine

## 2022-01-20 ENCOUNTER — Ambulatory Visit (INDEPENDENT_AMBULATORY_CARE_PROVIDER_SITE_OTHER): Payer: PPO | Admitting: Internal Medicine

## 2022-01-20 VITALS — BP 130/84 | HR 60 | Temp 97.3°F | Ht 64.5 in | Wt 129.0 lb

## 2022-01-20 DIAGNOSIS — N1832 Chronic kidney disease, stage 3b: Secondary | ICD-10-CM

## 2022-01-20 DIAGNOSIS — E039 Hypothyroidism, unspecified: Secondary | ICD-10-CM

## 2022-01-20 DIAGNOSIS — Z0000 Encounter for general adult medical examination without abnormal findings: Secondary | ICD-10-CM | POA: Diagnosis not present

## 2022-01-20 DIAGNOSIS — E1140 Type 2 diabetes mellitus with diabetic neuropathy, unspecified: Secondary | ICD-10-CM

## 2022-01-20 DIAGNOSIS — F3131 Bipolar disorder, current episode depressed, mild: Secondary | ICD-10-CM

## 2022-01-20 DIAGNOSIS — I10 Essential (primary) hypertension: Secondary | ICD-10-CM | POA: Diagnosis not present

## 2022-01-20 NOTE — Progress Notes (Signed)
Subjective:    Patient ID: Lori Phillips, female    DOB: [birth year], 66 y.o.   MRN: [id number]  HPI Here for Medicare wellness visit and follow up of chronic health conditions Reviewed advanced directives Reviewed other doctors----Dr Locklear---GI, [facility]--neuro, Dr Gordy Savers, No hospitalizations or surgery this year Vision is a little blurry --especially left eye (has to set up with eye doctor) Hearing is okay No alcohol No tobacco Doesn't really exercise 1 fall---no injury Chronic mood issues Mild memory issues are stable  Having a hard time with daughter after last week's visit She states she is going to leave the house She does much of the instrumental ADLs--but has no way to get around  Feels depressed now due to problems with daughter No persistent manic or irritable symptoms (just gets fired up by granddaughter who knows how to rile her up)  Not checking sugars laterly Off injections--just metformin and glipizide No foot numbness or burning  No chest pain No SOB No dizziness or syncope No edema lately (since lost weight)  GFR 38   Current Outpatient Medications on File Prior to Visit  Medication Sig Dispense Refill   acetaminophen (TYLENOL) 650 MG CR tablet Take 650 mg by mouth 3 (three) times daily as needed for pain.     buPROPion (WELLBUTRIN XL) 300 MG 24 hr tablet Take 1 tablet (300 mg total) by mouth daily. 90 tablet 3   diltiazem (CARDIZEM CD) 180 MG 24 hr capsule TAKE 1 CAPSULE(180 MG) BY MOUTH DAILY 90 capsule 3   divalproex (DEPAKOTE) 500 MG DR tablet Take 1 tablet (500 mg total) by mouth 2 (two) times daily. 180 tablet 3   glipiZIDE (GLUCOTROL) 5 MG tablet TAKE 1 TABLET(5 MG) BY MOUTH DAILY BEFORE BREAKFAST 90 tablet 3   glucose blood (ONETOUCH VERIO) test strip Use to check blood sugar once a day. Dx Code E11.40 100 each 4   levothyroxine (SYNTHROID) 100 MCG tablet TAKE 1 TABLET BY MOUTH DAILY BEFORE BREAKFAST 90 tablet 3    lisinopril (ZESTRIL) 5 MG tablet TAKE 1 TABLET(5 MG) BY MOUTH DAILY 90 tablet 3   memantine (NAMENDA) 5 MG tablet Take 1 tablet (5 mg at night) for 2 weeks, then increase to 1 tablet (5 mg) twice a day 60 tablet 11   metFORMIN (GLUCOPHAGE) 500 MG tablet TAKE 1 TABLET(500 MG) BY MOUTH TWICE DAILY WITH A MEAL 180 tablet 3   OneTouch Delica Lancets 42V MISC 1 each by Does not apply route daily. Use to obtain blood sample for blood sugar once a day. Dx Code E11.40 100 each 3   pantoprazole (PROTONIX) 40 MG tablet TAKE 1 TABLET(40 MG) BY MOUTH DAILY 90 tablet 3   rosuvastatin (CRESTOR) 20 MG tablet TAKE 1 TABLET BY MOUTH DAILY 90 tablet 3   vitamin B-12 (CYANOCOBALAMIN) 500 MCG tablet Take 500 mcg by mouth at bedtime.      VITAMIN D PO Take by mouth.     [DISCONTINUED] TAZTIA XT 180 MG 24 hr capsule TAKE ONE CAPSULE BY MOUTH EVERY DAY 90 capsule 0   No current facility-administered medications on file prior to visit.    Allergies  Allergen Reactions   Glyburide Other (See Comments)    Reaction:  Dizziness and confusion    Seroquel [Quetiapine Fumarate] Diarrhea, Nausea And Vomiting and Other (See Comments)    Reaction:  Confusion    Byetta 10 Mcg Pen [Exenatide] Nausea And Vomiting    Other reaction(s): Nausea And Vomiting  Past Medical History:  Diagnosis Date   Anxiety    with panic   Bipolar disorder ([place])    CAD (coronary artery disease)    multiple caths   Diabetes mellitus    Enlarged liver    Enlarged pituitary gland (HCC)    Fibromyalgia    GERD (gastroesophageal reflux disease)    Hyperlipidemia    Hypertension    Hypothyroidism    Kidney stones    Low back pain    Nephrolithiasis    Osteoarthritis    Osteoporosis    Parkinsonism 2016   better after antipsychotics stopped   Rheumatoid arthritis ([place])    Scoliosis     Past Surgical History:  Procedure Laterality Date   BLADDER SURGERY     ?bladder sling    CARDIAC CATHETERIZATION     x3   CATARACT  EXTRACTION, BILATERAL     COLONOSCOPY  multiple   2005 [place] records not available-polyps x 3   COLONOSCOPY WITH PROPOFOL N/A 10/12/2015   Procedure: COLONOSCOPY WITH PROPOFOL;  Surgeon: Lollie Sails, MD;  Location: [facility] ENDOSCOPY;  Service: Endoscopy;  Laterality: N/A;   COLONOSCOPY WITH PROPOFOL N/A 04/29/2021   Procedure: COLONOSCOPY WITH PROPOFOL;  Surgeon: [name], MD;  Location: [facility] ENDOSCOPY;  Service: Endoscopy;  Laterality: N/A;  DM   ESOPHAGEAL DILATION     kidney stone removal     TONSILLECTOMY     VAGINAL HYSTERECTOMY      Family History  Problem Relation Age of Onset   Hypertension Mother    Diabetes Mother    Bipolar disorder Father    Hypertension Father    Colon cancer Paternal Aunt    Colon cancer Paternal Grandmother    Thyroid disease Neg Hx    Breast cancer Neg Hx     Social History   Socioeconomic History   Marital status: Widowed    Spouse name: Not on file   Number of children: 3   Years of education: 55   Highest education level: Not on file  Occupational History   Occupation: [place]: disabled  Tobacco Use   Smoking status: Never    Passive exposure: Current   Smokeless tobacco: Never  Vaping Use   Vaping Use: Never used  Substance and Sexual Activity   Alcohol use: Yes    Comment: very rare   Drug use: No   Sexual activity: Not on file  Other Topics Concern   Not on file  Social History Narrative   Daughter and granddaughter live with her      Has a living will- desires CPR but no prolonged life support if futile.   Daughter Museum/gallery conservator is health care POA   No tube feeds if cognitively unaware   Social Determinants of Health   Financial Resource Strain: Not on file  Food Insecurity: Not on file  Transportation Needs: Not on file  Physical Activity: Not on file  Stress: Not on file  Social Connections: Not on file  Intimate Partner Violence: Not on file   Review of  Systems Appetite is okay Weight is stable Trouble initiating sleep--but then does well Nocturia x 2. No trouble voiding in day. No dysuria or hematuria. Pad for some urge incontinence Wears seat belt No teeth--can't afford dentures No heartburn or dysphagia Bowels okay--no blood Chronic low back pain---uses tylenol prn    Objective:   Physical Exam Constitutional:      Appearance: Normal appearance.  HENT:     Mouth/Throat:     Comments: Edentulous No lesions Cardiovascular:     Rate and Rhythm: Normal rate and regular rhythm.     Pulses: Normal pulses.     Heart sounds: No murmur heard.    No gallop.  Pulmonary:     Effort: Pulmonary effort is normal.     Breath sounds: Normal breath sounds. No wheezing or rales.  Abdominal:     Palpations: Abdomen is soft.     Tenderness: There is no abdominal tenderness.  Musculoskeletal:     Cervical back: Neck supple.     Right lower leg: No edema.     Left lower leg: No edema.  Lymphadenopathy:     Cervical: No cervical adenopathy.  Skin:    Findings: No lesion or rash.     Comments: No foot lesions  Neurological:     General: No focal deficit present.     Mental Status: She is alert and oriented to person, place, and time.     Comments: Mini-cog---clock fine, recall 0/3 Sensation in feet normal  Psychiatric:        Mood and Affect: Mood normal.        Behavior: Behavior normal.            Assessment & Plan:

## 2022-01-20 NOTE — Assessment & Plan Note (Signed)
Having situational stress but no major exacerbation On bupropion 300 daily and depakote 500 bid

## 2022-01-20 NOTE — Assessment & Plan Note (Signed)
Hasn't been checking Hopefully still okay on metformin 500 bid, glipizide 5 daily Neuropathy seems better

## 2022-01-20 NOTE — Progress Notes (Signed)
Vision Screening   Right eye Left eye Both eyes  Without correction     With correction '20/20 20/30 20/20 '$  Hearing Screening - Comments:: Passed whisper test

## 2022-01-20 NOTE — Assessment & Plan Note (Signed)
Now seeing nephrologist On lisinopril

## 2022-01-20 NOTE — Assessment & Plan Note (Signed)
Will recheck labs on levothyroxine 100

## 2022-01-20 NOTE — Assessment & Plan Note (Addendum)
BP Readings from Last 3 Encounters:  01/20/22 130/84  01/16/22 138/84  11/27/21 (!) 181/80   Lisinopril '10mg'$  daily and diltiazem 180

## 2022-01-20 NOTE — Assessment & Plan Note (Signed)
I have personally reviewed the Medicare Annual Wellness questionnaire and have noted 1. The patient's medical and social history 2. Their use of alcohol, tobacco or illicit drugs 3. Their current medications and supplements 4. The patient's functional ability including ADL's, fall risks, home safety risks and hearing or visual             impairment. 5. Diet and physical activities 6. Evidence for depression or mood disorders  The patients weight, height, BMI and visual acuity have been recorded in the chart I have made referrals, counseling and provided education to the patient based review of the above and I have provided the pt with a written personalized care plan for preventive services.  I have provided you with a copy of your personalized plan for preventive services. Please take the time to review along with your updated medication list.  Discussed walking/exercise Mammogram due again by June Colon again 2030 No pap due to age Prefers no flu'COVID vaccines Td at pharmacy

## 2022-01-21 ENCOUNTER — Telehealth: Payer: Self-pay | Admitting: Internal Medicine

## 2022-01-21 ENCOUNTER — Telehealth: Payer: Self-pay | Admitting: Radiology

## 2022-01-21 LAB — CBC
HCT: 39.3 % (ref 36.0–46.0)
Hemoglobin: 13.1 g/dL (ref 12.0–15.0)
MCHC: 33.4 g/dL (ref 30.0–36.0)
MCV: 95.3 fl (ref 78.0–100.0)
Platelets: 246 10*3/uL (ref 150.0–400.0)
RBC: 4.12 Mil/uL (ref 3.87–5.11)
RDW: 14.4 % (ref 11.5–15.5)
WBC: 9.4 10*3/uL (ref 4.0–10.5)

## 2022-01-21 LAB — VITAMIN D 25 HYDROXY (VIT D DEFICIENCY, FRACTURES): VITD: 71.25 ng/mL (ref 30.00–100.00)

## 2022-01-21 LAB — RENAL FUNCTION PANEL
Albumin: 5 g/dL (ref 3.5–5.2)
BUN: 31 mg/dL — ABNORMAL HIGH (ref 6–23)
CO2: 27 mEq/L (ref 19–32)
Calcium: 10.4 mg/dL (ref 8.4–10.5)
Chloride: 101 mEq/L (ref 96–112)
Creatinine, Ser: 1.29 mg/dL — ABNORMAL HIGH (ref 0.40–1.20)
GFR: 42.61 mL/min — ABNORMAL LOW (ref >60.00)
Glucose, Bld: 136 mg/dL — ABNORMAL HIGH (ref 70–99)
Phosphorus: 3.1 mg/dL (ref 2.3–4.6)
Potassium: 4.3 mEq/L (ref 3.5–5.1)
Sodium: 139 mEq/L (ref 135–145)

## 2022-01-21 LAB — HEPATIC FUNCTION PANEL
ALT: 26 U/L (ref 0–35)
AST: 16 U/L (ref 0–37)
Albumin: 5 g/dL (ref 3.5–5.2)
Alkaline Phosphatase: 66 U/L (ref 39–117)
Bilirubin, Direct: 0.1 mg/dL (ref 0.0–0.3)
Total Bilirubin: 0.4 mg/dL (ref 0.2–1.2)
Total Protein: 7.5 g/dL (ref 6.0–8.3)

## 2022-01-21 LAB — TSH: TSH: 4.41 u[IU]/mL (ref 0.35–5.50)

## 2022-01-21 LAB — LIPID PANEL
Cholesterol: 142 mg/dL (ref 0–200)
HDL: 73.8 mg/dL (ref >39.00)
LDL Cholesterol: 34 mg/dL (ref 0–99)
NonHDL: 67.9
Total CHOL/HDL Ratio: 2
Triglycerides: 172 mg/dL — ABNORMAL HIGH (ref 0.0–149.0)
VLDL: 34.4 mg/dL (ref 0.0–40.0)

## 2022-01-21 LAB — MICROALBUMIN / CREATININE URINE RATIO
Creatinine,U: 105 mg/dL
Microalb Creat Ratio: 7.7 mg/g (ref 0.0–30.0)
Microalb, Ur: 8.1 mg/dL — ABNORMAL HIGH (ref 0.0–1.9)

## 2022-01-21 LAB — T4, FREE: Free T4: 1.35 ng/dL (ref 0.60–1.60)

## 2022-01-21 LAB — HEMOGLOBIN A1C: Hgb A1c MFr Bld: 7.3 % — ABNORMAL HIGH (ref 4.6–6.5)

## 2022-01-21 NOTE — Addendum Note (Signed)
Addended by: Ellamae Sia on: 01/21/2022 08:23 AM   Modules accepted: Orders

## 2022-01-21 NOTE — Telephone Encounter (Signed)
Lori Phillips Done from [facility] called stating they received a specimen of blood for pt, but they need confirmation of what type of specimen it is? Reference # - [id number] X. Call back # [phone]

## 2022-01-21 NOTE — Telephone Encounter (Signed)
Lori Phillips were you able to reach them about this?

## 2022-01-21 NOTE — Telephone Encounter (Signed)
Patient needs to come in for another lab test that was missed. Non fasting. LVM

## 2022-01-24 ENCOUNTER — Other Ambulatory Visit: Payer: PPO

## 2022-01-24 DIAGNOSIS — F3131 Bipolar disorder, current episode depressed, mild: Secondary | ICD-10-CM

## 2022-01-25 LAB — TIQ-MISC

## 2022-01-25 LAB — VALPROIC ACID LEVEL
Valproic Acid Lvl: 63.3 mg/L (ref 50.0–100.0)
Valproic Acid Lvl: 100.3 mg/L — ABNORMAL HIGH (ref 50.0–100.0)

## 2022-01-25 LAB — PARATHYROID HORMONE, INTACT (NO CA): PTH: 35 pg/mL (ref 16–77)

## 2022-02-14 ENCOUNTER — Other Ambulatory Visit: Payer: Self-pay | Admitting: Internal Medicine

## 2022-02-14 DIAGNOSIS — E1169 Type 2 diabetes mellitus with other specified complication: Secondary | ICD-10-CM

## 2022-02-17 ENCOUNTER — Ambulatory Visit: Payer: Self-pay

## 2022-02-17 DIAGNOSIS — I10 Essential (primary) hypertension: Secondary | ICD-10-CM

## 2022-02-17 NOTE — Chronic Care Management (AMB) (Signed)
   CCM RN Visit Note   February 17, 2022 Name: JURNEY OVERACKER MRN: [id number]      DOB: [birth year]  Subjective: BIONCA MCKEY is a 66 y.o. year old female who is a primary care patient of Venia Carbon, MD. The patient was referred to the Chronic Care Management team for assistance with care management needs subsequent to provider initiation of CCM services and plan of care.      An unsuccessful telephone outreach was attempted today to contact the patient about Chronic Care Management needs.     PLAN A HIPAA compliant phone message was left for the patient providing contact information and requesting a return call.  Horris Latino RN Care Manager/Chronic Care Management [phone]

## 2022-02-18 ENCOUNTER — Ambulatory Visit: Payer: Self-pay

## 2022-02-18 DIAGNOSIS — I10 Essential (primary) hypertension: Secondary | ICD-10-CM

## 2022-02-18 NOTE — Chronic Care Management (AMB) (Signed)
   CCM RN Visit Note   February 18, 2022 Name: Lori Phillips MRN: [id number]      DOB: 1955/10/17  Subjective: Lori Phillips is a 66 y.o. year old female who is a primary care patient of Venia Carbon, MD. The patient was referred to the Chronic Care Management team for assistance with care management needs subsequent to provider initiation of CCM services and plan of care.      An unsuccessful telephone outreach was attempted today to contact the patient about Chronic Care Management needs.    PLAN A HIPAA compliant phone message was left for the patient providing contact information and requesting a return call.   Horris Latino RN Care Manager/Chronic Care Management [phone]

## 2022-02-24 ENCOUNTER — Telehealth: Payer: Self-pay

## 2022-02-24 NOTE — Telephone Encounter (Signed)
   CCM RN Visit Note   02/24/22 Name: SOLAE NORLING MRN: [id number]      DOB: 1955-08-31  Subjective: Lori Phillips is a 66 y.o. year old female who is a primary care patient of Venia Carbon, MD. The patient was referred to the Chronic Care Management team for assistance with care management needs subsequent to provider initiation of CCM services and plan of care.      An unsuccessful telephone outreach was attempted today to contact the patient about Chronic Care Management needs.     PLAN: A HIPAA compliant phone message was left for the patient providing contact information and requesting a return call.  [place] Manager/Chronic Care Management [phone]

## 2022-03-04 ENCOUNTER — Telehealth: Payer: Self-pay

## 2022-03-04 NOTE — Progress Notes (Signed)
  Chronic Care Management   Note  03/04/2022 Name: Lori Phillips MRN: [id number] DOB: May 07, 1955  ELIZABETHANNE LUSHER is a 66 y.o. year old female who is a primary care patient of Venia Carbon, MD. I reached out to Hoy Morn by phone today in response to a referral sent by Ms. [place] PCP.  Ms. MAAT KAFER was not successfully contacted today. A HIPAA compliant voice message was left requesting a return call.   Follow up plan: Additional outreach attempts will be made.  Noreene Larsson, [place], [place] [address] Direct Dial: [phone] [email or web address]

## 2022-03-06 ENCOUNTER — Other Ambulatory Visit: Payer: Self-pay | Admitting: Internal Medicine

## 2022-03-12 NOTE — Progress Notes (Signed)
  Chronic Care Management   Note  03/12/2022 Name: Lori Phillips MRN: [id number] DOB: [birth year]  Lori Phillips is a 66 y.o. year old female who is a primary care patient of Venia Carbon, MD. I reached out to Lori Phillips by phone today in response to a referral sent by Lori Phillips PCP.  Lori Phillips was not successfully contacted today. A HIPAA compliant voice message was left requesting a return call.   Follow up plan: Additional outreach attempts will be made.  Noreene Larsson, [place], [place] [address] Direct Dial: [phone] [email or web address]

## 2022-03-21 NOTE — Progress Notes (Signed)
  Chronic Care Management   Note  03/21/2022 Name: Lori Phillips MRN: [id number] DOB: Mar 02, 1956  Lori Phillips is a 66 y.o. year old female who is a primary care patient of Lori Carbon, MD. I reached out to Lori Phillips by phone today in response to a referral sent by Lori Phillips PCP.  Lori Phillips was not successfully contacted today. A HIPAA compliant voice message was left requesting a return call.   Follow up plan: Unable to reach patient after 3 attempts. No further outreach attempts will be made pending additional provider engagement, patient request, or new provider order.   Lori Phillips, [place], [place] [address] Direct Dial: [phone] [email or web address]

## 2022-05-19 ENCOUNTER — Encounter: Payer: Self-pay | Admitting: Psychology

## 2022-05-19 DIAGNOSIS — M797 Fibromyalgia: Secondary | ICD-10-CM | POA: Insufficient documentation

## 2022-05-20 ENCOUNTER — Ambulatory Visit (INDEPENDENT_AMBULATORY_CARE_PROVIDER_SITE_OTHER): Payer: PPO | Admitting: Psychology

## 2022-05-20 ENCOUNTER — Encounter: Payer: Self-pay | Admitting: Psychology

## 2022-05-20 ENCOUNTER — Ambulatory Visit: Payer: PPO

## 2022-05-20 DIAGNOSIS — F3341 Major depressive disorder, recurrent, in partial remission: Secondary | ICD-10-CM | POA: Insufficient documentation

## 2022-05-20 DIAGNOSIS — I6381 Other cerebral infarction due to occlusion or stenosis of small artery: Secondary | ICD-10-CM | POA: Diagnosis not present

## 2022-05-20 DIAGNOSIS — F411 Generalized anxiety disorder: Secondary | ICD-10-CM | POA: Diagnosis not present

## 2022-05-20 DIAGNOSIS — G3184 Mild cognitive impairment, so stated: Secondary | ICD-10-CM | POA: Diagnosis not present

## 2022-05-20 DIAGNOSIS — F410 Panic disorder [episodic paroxysmal anxiety] without agoraphobia: Secondary | ICD-10-CM

## 2022-05-20 DIAGNOSIS — M797 Fibromyalgia: Secondary | ICD-10-CM | POA: Diagnosis not present

## 2022-05-20 DIAGNOSIS — F332 Major depressive disorder, recurrent severe without psychotic features: Secondary | ICD-10-CM | POA: Diagnosis not present

## 2022-05-20 DIAGNOSIS — F603 Borderline personality disorder: Secondary | ICD-10-CM | POA: Insufficient documentation

## 2022-05-20 DIAGNOSIS — F1111 Opioid abuse, in remission: Secondary | ICD-10-CM | POA: Insufficient documentation

## 2022-05-20 DIAGNOSIS — R4189 Other symptoms and signs involving cognitive functions and awareness: Secondary | ICD-10-CM

## 2022-05-20 DIAGNOSIS — F329 Major depressive disorder, single episode, unspecified: Secondary | ICD-10-CM | POA: Insufficient documentation

## 2022-05-20 HISTORY — DX: Mild cognitive impairment of uncertain or unknown etiology: G31.84

## 2022-05-20 NOTE — Progress Notes (Signed)
   Psychometrician Note   Cognitive testing was administered to Lori Phillips by Cruzita Lederer, B.S. (psychometrist) under the supervision of Dr. Christia Reading, Ph.D., licensed psychologist on 05/20/2022. Lori Phillips did not appear overtly distressed by the testing session per behavioral observation or responses across self-report questionnaires. Rest breaks were offered.    The battery of tests administered was selected by Dr. Christia Reading, Ph.D. with consideration to Lori Phillips's current level of functioning, the nature of her symptoms, emotional and behavioral responses during interview, level of literacy, observed level of motivation/effort, and the nature of the referral question. This battery was communicated to the psychometrist. Communication between Dr. Christia Reading, Ph.D. and the psychometrist was ongoing throughout the evaluation and Dr. Christia Reading, Ph.D. was immediately accessible at all times. Dr. Christia Reading, Ph.D. provided supervision to the psychometrist on the date of this service to the extent necessary to assure the quality of all services provided.    Lori Phillips will return within approximately 1-2 weeks for an interactive feedback session with Dr. Melvyn Novas at which time her test performances, clinical impressions, and treatment recommendations will be reviewed in detail. Lori Phillips understands she can contact our office should she require our assistance before this time.  A total of 135 minutes of billable time were spent face-to-face with Lori Phillips by the psychometrist. This includes both test administration and scoring time. Billing for these services is reflected in the clinical report generated by Dr. Christia Reading, Ph.D.  This note reflects time spent with the psychometrician and does not include test scores or any clinical interpretations made by Dr. Melvyn Novas. The full report will follow in a separate note.

## 2022-05-20 NOTE — Progress Notes (Signed)
NEUROPSYCHOLOGICAL EVALUATION [place]. [facility] Department of Neurology  Date of Evaluation: May 20, 2022  Reason for Referral:   Lori Phillips is a 67 y.o. right-handed Caucasian female referred by Sharene Butters, PA-C, to characterize her current cognitive functioning and assist with diagnostic clarity and treatment planning in the context of subjective cognitive decline.   Assessment and Plan:   Clinical Impression(s): Lori Phillips's pattern of performance is suggestive of a primary impairment surrounding both encoding (i.e., learning) and delayed retrieval aspects of verbal and visual memory. Additional weaknesses were exhibited across verbal fluency (i.e., both phonemic and semantic fluency), as well as recognition/consolidation aspects of memory. Notable performance variability was exhibited across processing speed, basic attention, and executive functioning. Performances were appropriate relative to age-matched peers across complex attention, safety/judgment, confrontation naming, and visuospatial abilities. Lori Phillips and her granddaughter generally denied prominent difficulties completing instrumental activities of daily living (ADLs) independently. As such, given evidence for cognitive dysfunction described above, she meets criteria for a Mild Neurocognitive Disorder ("mild cognitive impairment") at the present time.  The etiology for ongoing cognitive dysfunction is most likely multifactorial. The likelihood for a prominent psychiatric contribution appears high. Historically, Lori Phillips has several diagnoses, including major depressive disorder, generalized anxiety disorder with panic attacks, bipolar disorder, and borderline personality disorder. Across mood-related questionnaires, she reported acutely severe symptoms of both anxiety and depression. It is also worth noting that her current family dynamic may be exacerbating mood concerns. At the conclusion of  the testing session, Lori Phillips spontaneously reported to the psychometrist that she feels unable to say things and does not have an adequate voice when her daughter and granddaughter are around. She alluded to this negatively impacting her mood and being a primary reason for why she may stay in her room throughout the day and try to avoid interaction. Severe psychiatric distress will certainly worsen cognitive abilities and the areas in which Lori Phillips exhibited weakness and/or variability are certainly reasonable findings in this context. Remote opiate abuse and other substance use, as well as fibromyalgia and symptoms of chronic pain will also affect similar domains and may exacerbate difficulties.  Neurologically speaking, Lori Phillips's most recent brain MRIs have revealed age advanced atrophy, as well as cerebrovascular disease and very small infarcts involving the left corona radiata/internal capsule and left posterior frontal lobe. Given this, there is certainly the potential for a vascular contribution to her current presentation given these findings. Current testing patterns would align with this presentation well. While her current testing patterns also can be seen in Parkinson's disease, prior work-up has suggested that remote parkinsonian symptoms were medication induced. As a prior DaTscan was normal, underlying Parkinson's disease seems unlikely. She also does not exhibit behavioral symptoms of Lewy body disease or frontotemporal lobar degeneration.   I cannot rule out the very early stages of underlying Alzheimer's disease. Lori Phillips did not benefit from repetition across learning trials and retention rates after relatively brief delays ranged from 6% to 50%. Recognition performances as a whole were somewhat below expectation. Taken together, this could suggest rapid forgetting and an evolving storage impairment, which would be worrisome for this illness. However, while I cannot rule out this  condition presently, I also cannot rule it in with confidence given the numerous confounding variables described above. Continued monitoring will be important moving forward.   Overall, the combination of cerebrovascular disease, lacunar infarctions, pronounced psychiatric distress and daily stress, chronic pain, and history of substance abuse  represents a plausible explanation for ongoing deficits based upon current data. While her granddaughter alluded to the perception that Lori Phillips memory lapses and repetition in conversation is volitional, I do not agree with this assessment. Lori Phillips has true cognitive dysfunction that is very likely influencing the frequency of these behaviors (e.g., repetition in conversation) occurring. Improvements in her psychiatric status may yield improvements in day-to-day functioning moving forward.   Recommendations: A repeat neuropsychological evaluation in 18-24 months (or sooner if functional decline is noted) is recommended to assess the trajectory of future cognitive decline should it occur. This will also aid in future efforts towards improved diagnostic clarity.  Lori Phillips has already been prescribed a medication aimed to address memory loss and concerns surrounding cognitive decline (i.e., memantine/Namenda). She is encouraged to continue taking this medication as prescribed. It is important to highlight that this medication has been shown to slow functional decline in some individuals. There is no current treatment which can stop or reverse cognitive decline when caused by a neurodegenerative illness.   A combination of medication and psychotherapy has been shown to be most effective at treating symptoms of anxiety and depression. As such, Lori Phillips is encouraged to speak with her prescribing physician regarding medication adjustments to optimally manage these symptoms. She may benefit from being followed by a psychiatrist given her extensive psychiatric  history. She and her family would be encouraged to contact some of these resources to see if they are accepting new patients and to see if they accept her insurance:  Dr. Modena Morrow - [facility]) - [facility] [place]) - [phone] Dr. Chucky May [facility]) [phone] [facility] [place]) [phone] [place] ([place]) - [place]) - [place], [address], [place], [place] Dr. Garner Nash (neuropsychiatry); Mike Craze; [facility]; 9th Floor; [place], [place] Dr. Norman Clay; [place]; [place]; [place], [place]  Likewise, Ms. Moor is encouraged to consider engaging in short-term psychotherapy to address symptoms of psychiatric distress. She would benefit from an active and collaborative therapeutic environment, rather than one purely supportive in nature. Recommended treatment modalities include Cognitive Behavioral Therapy (CBT) or Acceptance and Commitment Therapy (ACT).  Performance across neurocognitive testing is not a strong predictor of an individual's safety operating a motor vehicle. Should her family wish to pursue a formalized driving evaluation, they could reach out to the following agencies: [employer] in [place]: [phone] [facility]: [facility]: [place]: [phone] or [phone]  Should there be a progression of current deficits over time, she is unlikely to regain any independent living skills lost. Therefore, it is recommended that she remain as involved as possible in all aspects of household chores, finances, and medication management, with supervision to  ensure adequate performance. She will likely benefit from the establishment and maintenance of a routine in order to maximize her functional abilities over time.  It will be important for Ms. Redler to have another person with her when in situations where she may need to process information, weigh the pros and cons of different options, and make decisions, in order to ensure that she fully understands and recalls all information to be considered.  Ms. Kutscher is encouraged to attend to lifestyle factors for brain health (e.g., regular physical exercise, good nutrition habits, regular participation in cognitively-stimulating activities, and general  stress management techniques), which are likely to have benefits for both emotional adjustment and cognition. In fact, in addition to promoting good general health, regular exercise incorporating aerobic activities (e.g., brisk walking, jogging, cycling, etc.) has been demonstrated to be a very effective treatment for depression and stress, with similar efficacy rates to both antidepressant medication and psychotherapy. Optimal control of vascular risk factors (including safe cardiovascular exercise and adherence to dietary recommendations) is encouraged. Continued participation in activities which provide mental stimulation and social interaction is also recommended.  Memory can be improved using internal strategies such as rehearsal, repetition, chunking, mnemonics, association, and imagery. External strategies such as written notes in a consistently used memory journal, visual and nonverbal auditory cues such as a calendar on the refrigerator or appointments with alarm, such as on a cell phone, can also help maximize recall.    To address problems with processing speed, she may wish to consider:   -Ensuring that she is alerted when essential material or instructions are being presented   -Adjusting the speed at which new information is presented   -Allowing  for more time in comprehending, processing, and responding in conversation  To address problems with fluctuating attention, she may wish to consider:   -Avoiding external distractions when needing to concentrate   -Limiting exposure to fast paced environments with multiple sensory demands   -Writing down complicated information and using checklists   -Attempting and completing one task at a time (i.e., no multi-tasking)   -Verbalizing aloud each step of a task to maintain focus   -Taking frequent breaks during the completion of steps/tasks to avoid fatigue   -Reducing the amount of information considered at one time  Review of Records:   Ms. Persico was seen by [facility] Sarina Ill, M.D.) on 12/15/2014 for a second opinion regarding ongoing parkinsonism. She had been previously evaluated by several neurologists at [facility] and diagnosed with parkinsonism. There is a lengthy psychiatric history, including bipolar disorder with a prior suicide attempt. At that time, her daughter reported that parkinsonian symptoms seemed to start around the time of psychiatric medication (i.e., Abilify). She was followed by Dr. Jaynee Eagles over time and had discontinued Abilify at the time of her 02/22/2015 follow-up. A brain MRI on 03/02/2015 revealed moderately advanced generalized cerebral atrophy but no other abnormalities. A DaTscan on 07/28/2015 was normal and not suggestive of an underlying parkinsonian condition. She last met with Dr. Jaynee Eagles on 06/10/2016. At that time, the assumption for parkinsonian symptoms was that these were medication induced. Since she discontinued said medications, her tremors resolved and her balance had improved.   Brain MRI on 06/03/2020 revealed age advanced generalized atrophy, said to have progressed since her 2016 scan. It also revealed mild microvascular ischemic disease.   She was initially seen by [facility] Sharene Butters, PA-C) on 10/23/2021 to evaluation ongoing  memory loss. At that time, memory concerns were said to be present for the past year. Examples included trouble recalling recent information and names of individuals. She also described repetitive conversation. Her family expressed some concerns surrounding hypochondriasis and that Ms. Seng has been extensively reading about dementia presentations and may be conforming to some of these symptoms. Some ongoing balance difficulties were noted; however, her daughter noted that her mother has no issues in certain situations or with certain motivations. There is a fairly significant psychiatric history, including bipolar disorder, borderline personality disorder, major depressive disorder, and generalized anxiety disorder with prior panic attacks. There is also a  history of fibromyalgia. Sleep dysfunction was denied. She no longer drives and there was some suggestion that her family was involved in ADLs. Performance on a brief cognitive screening instrument (MOCA) was 23/30. Ultimately, Ms. Gatchel was referred for a comprehensive neuropsychological evaluation to characterize her cognitive abilities and to assist with diagnostic clarity and treatment planning.  Brain MRI on 11/03/2021 was stable atrophy-wise relative to her prior scan. It did reveal a "redemonstrated" chronic lacunar infarct in the left corona radiata/internal capsule, as well as a new lacunar infarct within the posterior left frontal lobe.   Past Medical History:  Diagnosis Date   Abnormal urine 10/02/2021   Acquired hypothyroidism 11/26/2009   Last Assessment & Plan: TSH low when last checked. Advised follow up labs and to follow up with Dr. Cruzita Lederer. The patient indicates understanding of these issues and agrees with the plan.   Atherosclerotic heart disease of native coronary artery without angina pectoris 11/26/2009   Last Assessment & Plan: Quiet. On ASA, betablocker and statin.   Bipolar disorder, unspecified 11/26/2009   Last Assessment  & Plan: Stable on current rx.   Borderline personality disorder    CAD (coronary artery disease)    multiple caths   Chronic pain syndrome 02/21/2010   Formatting of this note might be different from the original. Last Assessment & Plan: Followed by Dr. Primus Bravo. Yes, I feel she can stop ASA 5 days prior if necessary to procedure. Form will be faxed back to him.   DDD (degenerative disc disease), lumbar 09/28/2014   Enlarged liver    Enlarged pituitary gland    Essential hypertension 11/26/2009   Fibromyalgia    Generalized anxiety disorder with panic attacks 11/26/2009   GERD (gastroesophageal reflux disease)    History of opioid abuse    Sober since 2016   History of suburethral sling procedure 12/13/2015   Hyperlipidemia due to type 2 diabetes mellitus 06/17/2017   Incomplete emptying of bladder 01/31/2016   Low back pain    Major depressive disorder    MCI (mild cognitive impairment) 05/23/2020   Multiple lacunar infarcts 11/03/2021   left corona radiata/internal capsule; posterior left frontal lobe.    Neck pain 08/14/2014   Nephrolithiasis    Osteoarthritis    Osteoporosis    Overactive detrusor 01/31/2016   Parkinsonism 2016   tremor; resolved after antipsychotics were discontinued; normal DaTscan   Personal history of colonic polyps 10/03/2011   2005 3 polyps - no records  2007 no polyps  10/03/2011-5 diminutive polyps, 4 recovered - adenomas      Proteinuria, unspecified 10/02/2021   Rheumatoid arthritis    Sacroiliac joint disease 09/24/2014   Scoliosis    Simple renal cyst 11/05/2021   Stage 3b chronic kidney disease 07/11/2020   SUI (stress urinary incontinence, female) 01/31/2016   Type 2 diabetes mellitus with diabetic polyneuropathy, without long-term current use of insulin 10/01/2011   Urinary incontinence 12/13/2015   Venous insufficiency 06/03/2018    Past Surgical History:  Procedure Laterality Date   BLADDER SURGERY     ?bladder sling    CARDIAC  CATHETERIZATION     x3   CATARACT EXTRACTION, BILATERAL     COLONOSCOPY  multiple   2005 [place] not available-polyps x 3   COLONOSCOPY WITH PROPOFOL N/A 10/12/2015   Procedure: COLONOSCOPY WITH PROPOFOL;  Surgeon: Lollie Sails, MD;  Location: [facility] ENDOSCOPY;  Service: Endoscopy;  Laterality: N/A;   COLONOSCOPY WITH PROPOFOL N/A 04/29/2021   Procedure: COLONOSCOPY  WITH PROPOFOL;  Surgeon: [name], MD;  Location: [facility] ENDOSCOPY;  Service: Endoscopy;  Laterality: N/A;  DM   ESOPHAGEAL DILATION     kidney stone removal     TONSILLECTOMY     VAGINAL HYSTERECTOMY      Current Outpatient Medications:    acetaminophen (TYLENOL) 650 MG CR tablet, Take 650 mg by mouth 3 (three) times daily as needed for pain., Disp: , Rfl:    buPROPion (WELLBUTRIN XL) 300 MG 24 hr tablet, Take 1 tablet (300 mg total) by mouth daily., Disp: 90 tablet, Rfl: 3   diltiazem (CARDIZEM CD) 180 MG 24 hr capsule, TAKE 1 CAPSULE(180 MG) BY MOUTH DAILY, Disp: 90 capsule, Rfl: 3   divalproex (DEPAKOTE) 500 MG DR tablet, TAKE 1 TABLET(500 MG) BY MOUTH TWICE DAILY, Disp: 180 tablet, Rfl: 3   glipiZIDE (GLUCOTROL) 5 MG tablet, TAKE 1 TABLET(5 MG) BY MOUTH DAILY BEFORE BREAKFAST, Disp: 90 tablet, Rfl: 3   glucose blood (ONETOUCH VERIO) test strip, Use to check blood sugar once a day. Dx Code E11.40, Disp: 100 each, Rfl: 4   levothyroxine (SYNTHROID) 100 MCG tablet, TAKE 1 TABLET BY MOUTH DAILY BEFORE BREAKFAST, Disp: 90 tablet, Rfl: 3   lisinopril (ZESTRIL) 5 MG tablet, TAKE 1 TABLET(5 MG) BY MOUTH DAILY, Disp: 90 tablet, Rfl: 3   memantine (NAMENDA) 5 MG tablet, Take 1 tablet (5 mg at night) for 2 weeks, then increase to 1 tablet (5 mg) twice a day, Disp: 60 tablet, Rfl: 11   metFORMIN (GLUCOPHAGE) 500 MG tablet, TAKE 1 TABLET(500 MG) BY MOUTH TWICE DAILY WITH A MEAL, Disp: 180 tablet, Rfl: 3   OneTouch Delica Lancets 63O MISC, 1 each by Does not apply route daily. Use to obtain blood sample for blood  sugar once a day. Dx Code E11.40, Disp: 100 each, Rfl: 3   pantoprazole (PROTONIX) 40 MG tablet, TAKE 1 TABLET(40 MG) BY MOUTH DAILY, Disp: 90 tablet, Rfl: 3   rosuvastatin (CRESTOR) 20 MG tablet, TAKE 1 TABLET BY MOUTH DAILY, Disp: 90 tablet, Rfl: 3   vitamin B-12 (CYANOCOBALAMIN) 500 MCG tablet, Take 500 mcg by mouth at bedtime. , Disp: , Rfl:    VITAMIN D PO, Take by mouth., Disp: , Rfl:   Clinical Interview:   The following information was obtained during a clinical interview with Ms. Brendlinger and her granddaughter prior to cognitive testing.  Cognitive Symptoms: Decreased short-term memory: Endorsed. She described "some" difficulties recalling details of recently provided information, trouble recalling names of certain individuals, and being quite repetitive in conversation. She was unclear on the overall timeline of memory dysfunction but estimated progressive decline during the past year. Her granddaughter was in agreement with the report of repetitive conversation, noting that Ms. Cadena may ask the same question within minutes of previously being provided the answer. However, her granddaughter described a potential functional aspect to this, holding the perception that repetitive questions allow Ms. Schoch to engage her and her mother in direct conversation. Her granddaughter noted that this behavior does not seem to be a consistent behavior and rather reflects behaviors based upon the current situation.  Decreased long-term memory: Denied. Decreased attention/concentration: Endorsed. She reported "some" greater difficulty with sustained attention and distractibility. Her granddaughter also described this as potentially being functional and/or selective as she does not seem to have trouble paying attention to individuals outside of her and her mother.  Reduced processing speed: Endorsed. Difficulties with executive functions: Denied. They also denied any trouble with impulsivity  or any  significant personality changes. Her granddaughter described a long history of Ms. Rippeon "lashing out" at others, largely related to her history of bipolar disorder and borderline personality disorder. She described a potentially elevated mood state over the past few weeks, potentially triggered by them getting a new puppy. Her granddaughter noted that Ms. Poster has been seen holding the puppy by its collar and potentially abusing the shock collar feature.  Difficulties with emotion regulation: Denied outside what is described above.  Difficulties with receptive language: Denied. Difficulties with word finding: Endorsed. Decreased visuoperceptual ability: Denied. However, her granddaughter noted some difficulties, noting that Ms. Ferryman will bend over to open animal cages and hit her head on the open door or frame of the cage.   Difficulties completing ADLs: Largely denied. Since seeing Ms. Wertman in July 2023, Ms. Werts's mother and granddaughter have been working on having her regain her functional independence. This has reportedly gone reasonably well and Ms. Murry is generally independent with medication and financial management. Her family will look over her shoulder surrounding the former, especially given Ms. Chopra's history of opiate abuse. She continues to not drive at the advice of her medical team.   Additional Medical History: History of traumatic brain injury/concussion: Denied. History of stroke: Prior MRIs revealed tiny lacunar infarcts involving the left corona radiata/internal capsule and posterior left frontal lobe. These appear to have been asymptomatic.  History of seizure activity: Denied. History of known exposure to toxins: Denied. Symptoms of chronic pain: Endorsed. She described diffuse pain attributed to her history of fibromyalgia.  Experience of frequent headaches/migraines: Denied. Frequent instances of dizziness/vertigo: Denied.  Sensory changes: She wears glasses  with some benefit. Other changes/difficulties (e.g., hearing, taste, smell) were denied.  Balance/coordination difficulties: Endorsed. She described her balance as "not good," noting that she has slipped and fallen several times. She was unsure when her most recent fall occurred, estimating that it was 1-2 months prior. One side of the body was not said to be less stable than the other. Per her granddaughter, members of Ms. Guidotti's medical team have suggested that generalized weakness is most directly related to a sedentary lifestyle.  Other motor difficulties: Denied (see history of parkinsonian symptoms above).   Sleep History: Estimated hours obtained each night: 7-8 hours.  Difficulties falling asleep: Endorsed. Difficulties staying asleep: Endorsed. Difficulties were said to occur intermittently. Feels rested and refreshed upon awakening: Endorsed "for the most part."  History of snoring: Endorsed. History of waking up gasping for air: Denied. Witnessed breath cessation while asleep: Denied. She was previously diagnosed with mild obstructive sleep apnea and prescribed a CPAP machine. However, her granddaughter noted that Ms. Games's sinuses were "cleaned out" in the past and these symptoms appeared much improved. She does not currently use a CPAP machine regularly.   History of vivid dreaming: Denied. Excessive movement while asleep: Denied. Instances of acting out her dreams: Denied.  Psychiatric/Behavioral Health History: Depression: Endorsed. She described a long history of depressive symptoms, exacerbated by her history of bipolar disorder and, at times, severe depressive episodes. Medical records suggest one remote suicide attempt in the context of a depressive episode. Ms. Shackleford did not describe any current suicidal ideation, intent, or plan. She described her current mood as "okay" and did suggest that current psychiatric medications were at least somewhat helpful.  Anxiety:  Endorsed. She acknowledged a long history of generalized anxious distress. Medical records also suggest prior panic attacks.  Mania: Endorsed. She has been diagnosed  with bipolar disorder in the past. As stated above, her granddaughter wondered about a recent elevated mood state triggered by her family getting a new puppy. It was unclear if this truly constituted a hypomanic episode or if this simply reflected frustration in dealing with the energy and transition that comes with a new puppy.  Trauma History: Endorsed. There is no formal diagnosis of PTSD in her medical records which I am able to see. However, her granddaughter did describe the passing of Ms. Raider's husband in the late 90s as sudden and particular "traumatic" for her. Visual/auditory hallucinations: Denied. Delusional thoughts: Denied.  Tobacco: Denied. Alcohol: She denied current alcohol abuse as well as a history of problematic alcohol abuse or dependence.  Recreational drugs: Her granddaughter reported a history of opiate abuse dating back many years. With the help of her family, she was able to receive treatment and has more or less maintained sobriety since 2016. Her granddaughter alluded to a potential mishap here and there but denied her knowledge of any current use/abuse. Her granddaughter also described a remote history of cocaine and marijuana use/abuse. No current use was reported.   Family History: Problem Relation Age of Onset   Hypertension Mother    Diabetes Mother    Bipolar disorder Father    Hypertension Father    Colon cancer Paternal Aunt    Colon cancer Paternal Grandmother    Thyroid disease Neg Hx    Breast cancer Neg Hx    This information was confirmed by Ms. Ozga.  Academic/Vocational History: Highest level of educational attainment: 11 years. She reported leaving high school while in the 12th grade but did go back to earn her GED. She generally described herself as a good (A/B) student in  academic settings. Chemistry perhaps represented a relative weakness.  History of developmental delay: Denied. History of grade repetition: Denied. Enrollment in special education courses: Denied. History of LD/ADHD: Denied.  Employment: She has received disability benefits for many years stemming from her complex medical and psychiatric history. Prior to this, she worked as a Product manager and labels.   Evaluation Results:   Behavioral Observations: Ms. Woods was accompanied by her granddaughter and was appropriately dressed and groomed. They were 25 minutes late due to reported traffic difficulties en route. Ms. Schamberger appeared alert and. She ambulated slowly and was fairly unsteady, often reaching out to the wall or other things in her environment for added stability. Gross motor functioning appeared intact upon informal observation and no abnormal movements (e.g., tremors) were noted. Her affect was fairly flat throughout the interview. This did not seem to change when her granddaughter provided contradicting information or more expansive history. Spontaneous speech was fluent and word finding difficulties were not observed during the clinical interview. Thought processes were coherent, organized, and normal in content. Insight into her cognitive difficulties appeared adequate.   During testing, Ms. Mishkin expressed anxiety. She also appeared quite anxious and exhibited some shakiness in her hands. Sustained attention was appropriate. Task engagement was adequate and she persisted when challenged. Overall, Ms. Bena was cooperative with the clinical interview and subsequent testing procedures. At the conclusion of the testing session, Ms. Montella reported to the psychometrist that she feels unable to say things and does not have an adequate voice when her daughter and granddaughter are around. She alluded to this negatively impacting her mood and being a primary  reason for why she may stay in her room throughout the day and try  to avoid interaction. She provided this information spontaneously as they left the examination room.   Adequacy of Effort: The validity of neuropsychological testing is limited by the extent to which the individual being tested may be assumed to have exerted adequate effort during testing. Ms. Neumeyer expressed her intention to perform to the best of her abilities and exhibited adequate task engagement and persistence. Scores across stand-alone and embedded performance validity measures were within expectation. As such, the results of the current evaluation are believed to be a valid representation of Ms. Hodge's current cognitive functioning.  Test Results: Ms. Reas was oriented at the time of the current evaluation. She incorrectly stated her age ("73") and was one day off when stating the current day of the week.  Intellectual abilities based upon educational and vocational attainment were estimated to be in the below average to average range. Premorbid abilities were estimated to be within the below average range based upon a single-word reading test.   Processing speed was variable, ranging from the well below average to average normative ranges. Basic attention was variable, ranging from the well below average to average normative ranges. More complex attention (e.g., working memory) was average. Executive functioning was variable, ranging from the exceptionally low to average normative ranges. She performed in the average range across a task assessing safety and judgment.  Assessed receptive language abilities were above average. Likewise, Ms. Reha did not exhibit any difficulties comprehending task instructions and answered all questions asked of her appropriately. Assessed expressive language was variable. Both phonemic and semantic fluency were well below average to below average while confrontation naming was average to  above average.       Assessed visuospatial/visuoconstructional abilities were below average to average.    Learning (i.e., encoding) of novel verbal information was well below average. Spontaneous delayed recall (i.e., retrieval) of previously learned information was exceptionally low to well below average. Retention rates were 50% across a story learning task, 29% across a list learning task, and 6% across a figure drawing task. Performance across recognition tasks was well below average to below average, suggesting limited evidence for information consolidation.   Results of emotional screening instruments suggested that recent symptoms of generalized anxiety were in the severe range, while symptoms of depression were also within the severe range. A screening instrument assessing recent sleep quality suggested the presence of mild sleep dysfunction.  Tables of Scores:   Note: This summary of test scores accompanies the interpretive report and should not be considered in isolation without reference to the appropriate sections in the text. Descriptors are based on appropriate normative data and may be adjusted based on clinical judgment. Terms such as "Within Normal Limits" and "Outside Normal Limits" are used when a more specific description of the test score cannot be determined.       Percentile - Normative Descriptor > 98 - Exceptionally High 91-97 - Well Above Average 75-90 - Above Average 25-74 - Average 9-24 - Below Average 2-8 - Well Below Average < 2 - Exceptionally Low       Orientation:      Raw Score Percentile   NAB Orientation, Form 1 27/29 --- ---       Cognitive Screening:      Raw Score Percentile   SLUMS: 17/30 --- ---       RBANS, Form A: Standard Score/ Scaled Score Percentile   Total Score 66 1 Exceptionally Low  Immediate Memory 65 1 Exceptionally Low  List Learning 4 2 Well Below Average    Story Memory 4 2 Well Below Average  Visuospatial/Constructional 89  23 Below Average    Figure Copy 10 50 Average    Line Orientation 13/20 10-16 Below Average  Language 92 30 Average    Picture Naming 10/10 51-75 Average    Semantic Fluency 7 16 Below Average  Attention 64 1 Exceptionally Low    Digit Span 4 2 Well Below Average    Coding 5 5 Well Below Average  Delayed Memory 56 <1 Exceptionally Low    List Recall 2/10 3-9 Well Below Average    List Recognition 17/20 3-9 Well Below Average    Story Recall 3 1 Exceptionally Low    Story Recognition 8/12 8-15 Below Average    Figure Recall 1 <1 Exceptionally Low    Figure Recognition 4/8 9-20 Below Average        Intellectual Functioning:      Standard Score Percentile   Test of Premorbid Functioning: 85 16 Below Average       Attention/Executive Function:     Trail Making Test (TMT): Raw Score (T Score) Percentile     Part A 57 secs.,  2 errors (38) 12 Below Average    Part B 85 secs.,  0 errors (55) 69 Average         Scaled Score Percentile   WAIS-IV Digit Span: 8 25 Average    Forward 8 25 Average    Backward 10 50 Average    Sequencing 8 25 Average        Scaled Score Percentile   WAIS-IV Similarities: 8 25 Average       D-KEFS Color-Word Interference Test: Raw Score (Scaled Score) Percentile     Color Naming 31 secs. (11) 63 Average    Word Reading 24 secs. (10) 50 Average    Inhibition 73 secs. (9) 37 Average      Total Errors 7 errors (4) 2 Well Below Average    Inhibition/Switching 121 secs. (4) 2 Well Below Average      Total Errors 13 errors (1) <1 Exceptionally Low       D-KEFS Verbal Fluency Test: Raw Score (Scaled Score) Percentile     Letter Total Correct 21 (6) 9 Below Average    Category Total Correct 24 (6) 9 Below Average    Category Switching Total Correct 9 (6) 9 Below Average    Category Switching Accuracy 8 (7) 16 Below Average      Total Set Loss Errors 0 (13) 84 Above Average      Total Repetition Errors 6 (7) 16 Below Average       NAB Executive  Functions Module, Form 1: T Score Percentile     Judgment 52 58 Average       Language:     Verbal Fluency Test: Raw Score (T Score) Percentile     Phonemic Fluency (FAS) 21 (33) 5 Well Below Average    Animal Fluency 10 (28) 2 Well Below Average        NAB Language Module, Form 1: T Score Percentile     Auditory Comprehension 58 79 Above Average    Naming 31/31 (58) 79 Above Average       Visuospatial/Visuoconstruction:      Raw Score Percentile   Clock Drawing: 8/10 --- Within Normal Limits        Scaled Score Percentile   WAIS-IV Block Design: 9 37 Average  Mood and Personality:      Raw Score Percentile   Beck Depression Inventory - II: 36 --- Severe  PROMIS Anxiety Questionnaire: 30 --- Severe       Additional Questionnaires:      Raw Score Percentile   PROMIS Sleep Disturbance Questionnaire: 26 --- Mild   Informed Consent and Coding/Compliance:   The current evaluation represents a clinical evaluation for the purposes previously outlined by the referral source and is in no way reflective of a forensic evaluation.   Ms. Maslin was provided with a verbal description of the nature and purpose of the present neuropsychological evaluation. Also reviewed were the foreseeable risks and/or discomforts and benefits of the procedure, limits of confidentiality, and mandatory reporting requirements of this provider. The patient was given the opportunity to ask questions and receive answers about the evaluation. Oral consent to participate was provided by the patient.   This evaluation was conducted by Christia Reading, Ph.D., ABPP-CN, board certified clinical neuropsychologist. Ms. Christian completed a clinical interview with Dr. Melvyn Novas, billed as one unit [phone], and 135 minutes of cognitive testing and scoring, billed as one unit [phone] and four additional units 96139. Psychometrist Cruzita Lederer, B.S., assisted Dr. Melvyn Novas with test administration and scoring procedures. As a separate  and discrete service, Dr. Melvyn Novas spent a total of 160 minutes in interpretation and report writing billed as one unit [phone] and two units 96133.

## 2022-05-22 ENCOUNTER — Other Ambulatory Visit: Payer: Self-pay | Admitting: Internal Medicine

## 2022-05-23 ENCOUNTER — Telehealth: Payer: Self-pay | Admitting: Internal Medicine

## 2022-05-23 NOTE — Telephone Encounter (Signed)
Already done at the request of the pharmacy.

## 2022-05-23 NOTE — Telephone Encounter (Signed)
Prescription Request  05/23/2022  Is this a "Controlled Substance" medicine? No  LOV: 01/20/2022  What is the name of the medication or equipment? pantoprazole (PROTONIX) 40 MG tablet   Have you contacted your pharmacy to request a refill? Yes   Which pharmacy would you like this sent to?  [id number] [address], [place] AT [facility] OF [address] [place] [address] Phone: [phone] Fax: [phone]    Patient notified that their request is being sent to the clinical staff for review and that they should receive a response within 2 business days.   Please advise at Mobile [phone] (mobile)

## 2022-05-27 ENCOUNTER — Ambulatory Visit (INDEPENDENT_AMBULATORY_CARE_PROVIDER_SITE_OTHER): Payer: PPO | Admitting: Psychology

## 2022-05-27 DIAGNOSIS — F411 Generalized anxiety disorder: Secondary | ICD-10-CM | POA: Diagnosis not present

## 2022-05-27 DIAGNOSIS — F332 Major depressive disorder, recurrent severe without psychotic features: Secondary | ICD-10-CM

## 2022-05-27 DIAGNOSIS — G3184 Mild cognitive impairment, so stated: Secondary | ICD-10-CM | POA: Diagnosis not present

## 2022-05-27 DIAGNOSIS — F410 Panic disorder [episodic paroxysmal anxiety] without agoraphobia: Secondary | ICD-10-CM

## 2022-05-27 DIAGNOSIS — I6381 Other cerebral infarction due to occlusion or stenosis of small artery: Secondary | ICD-10-CM | POA: Diagnosis not present

## 2022-05-27 NOTE — Progress Notes (Signed)
   Neuropsychology Feedback Session [name]. [place] Department of Neurology  Reason for Referral:   Lori Phillips is a 67 y.o. right-handed Caucasian female referred by Sharene Butters, PA-C, to characterize her current cognitive functioning and assist with diagnostic clarity and treatment planning in the context of subjective cognitive decline.   Feedback:   [name] completed a comprehensive neuropsychological evaluation on 05/20/2022. Please refer to that encounter for the full report and recommendations. Briefly, results suggested a primary impairment surrounding both encoding (i.e., learning) and delayed retrieval aspects of verbal and visual memory. Additional weaknesses were exhibited across verbal fluency (i.e., both phonemic and semantic fluency), as well as recognition/consolidation aspects of memory. Notable performance variability was exhibited across processing speed, basic attention, and executive functioning. The etiology for ongoing cognitive dysfunction is most likely multifactorial. The likelihood for a prominent psychiatric contribution appears high. Historically, [name] has several diagnoses, including major depressive disorder, generalized anxiety disorder with panic attacks, bipolar disorder, and borderline personality disorder. Across mood-related questionnaires, she reported acutely severe symptoms of both anxiety and depression. It is also worth noting that her current family dynamic may be exacerbating mood concerns. At the conclusion of the testing session, [name] spontaneously reported to the psychometrist that she feels unable to say things and does not have an adequate voice when her daughter and granddaughter are around. She alluded to this negatively impacting her mood and being a primary reason for why she may stay in her room throughout the day and try to avoid interaction. Severe psychiatric distress will certainly worsen cognitive  abilities and the areas in which [name] exhibited weakness and/or variability are certainly reasonable findings in this context. Remote opiate abuse and other substance use, as well as fibromyalgia and symptoms of chronic pain will also affect similar domains and may exacerbate difficulties. Neurologically speaking, 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 Given this, there is certainly the potential for a vascular contribution to her current presentation given these findings. Current testing patterns would align with this presentation well.  [name] was accompanied by her daughter and granddaughter during the current feedback session. Content of the current session focused on the results of her neuropsychological evaluation. [name] was given the opportunity to ask questions and her questions were answered. She was encouraged to reach out should additional questions arise. A copy of her report was provided at the conclusion of the visit.      30 minutes were spent conducting the current feedback session with [name], billed as one unit [phone].

## 2022-05-28 ENCOUNTER — Ambulatory Visit: Payer: PPO | Admitting: Physician Assistant

## 2022-06-01 NOTE — Progress Notes (Signed)
Assessment/Plan:   Mild cognitive impairment of multiple etiologies, with a vascular contribution  Lori Phillips is a very pleasant 67 y.o. RH female  with  a history of hypertension, hyperlipidemia, DM2, anxiety with panic attacks, bipolar disorder, depression, CAD, fibromyalgia, history of CKD, hypothyroidism, rheumatoid arthritis, B12 deficiency, hepatomegaly presenting today in follow-up for evaluation of memory loss. Patient is on memantine 5 mg twice daily tolerating well.  Personally reviewed of the brain 10/2021 without  intracranial abnormality, tiny chronic cortical infarct within the posterior left frontal  lobe, new from the prior MRI of 06/02/2020., redemonstrated chronic lacunar infarct within the left corona radiata/internal capsule, chronic small vessel ischemic disease, stable and age advanced generalized parenchymal atrophy, also stable. Neuropsych evaluation 05/20/22 yielded a diagnosis of Mild Cognitive Impairment, likely of multiple etiologies, with prominent psychiatric contribution (including GAD with panic attacks, BP disorder, BL personality disorder, depression, psychiatric stress) , remote opiate, fibromyalgia, and also a vascular contribution(lacunar infarction) . Neurodegenerative disease cannot be ruled out at this time, however Neuropsych evaluation recommends repeating her neuro cognitive testing in the future.  At this time, the patient has not sought psychiatric evaluation to help her in the management of bipolar disorder and borderline personality disorder, depression and psychiatric stress, and we discussed its importance.    Recommendations:   Follow up in 6 months. Continue Memantine increased to 10 mg twice daily. Side effects were discussed  Recommend good control of cardiovascular risk factors.  Patient was informed of her very elevated blood pressure, she will discuss with her PCP   Continue to control mood as per PCP Recommend psychiatry and psychotherapy,  daughter agreed to follow-up on this issue. Repeat neuropsychological evaluation in 18-24 months for clarity of diagnosis and disease trajectory Continue B12 supplementation     Subjective:   This patient is accompanied in the office by daughter and granddaughter who supplements the history. Previous records as well as any outside records available were reviewed prior to todays visit.   Patient was last seen on 11/2021 at which time her MoCA was 23/30      Any changes in memory since last visit? Not improved, "may be getting worse "-daughter says.  Patient has some difficulty remembering recent conversations and people names  repeats oneself?  Endorsed Disoriented when walking into a room?  Patient denies   Leaving objects in unusual places?  Patient denies   Wandering behavior?   denies   Any personality changes since last visit?   denies   Any worsening depression?: Endorsed   Hallucinations or paranoia?  She has a history of borderline paranoid schizophrenia.  Has not found yet a psychiatrist and psychotherapist. Seizures?   denies    Any sleep changes?  Denies  vivid dreams, REM behavior or sleepwalking   Sleep apnea?   denies   Any hygiene concerns?   denies   Independent of bathing and dressing?  Endorsed  Does the patient needs help with medications? Patient  is in charge Who is in charge of the finances?  Patient is in charge Any changes in appetite?  denies Patient have trouble swallowing?  denies   Does the patient cook?  Any kitchen accidents such as leaving the stove on?   denies   Any headaches?    denies   Vision changes? denies Chronic back pain  denies   Ambulates with difficulty?    denies   Recent falls or head injuries?    denies  Unilateral weakness, numbness or tingling?   denies   Any tremors? "Only when nervous" Any anosmia?    denies   Any incontinence of urine?  History of urge incontinence, wears pads Any bowel dysfunction?  denies      Patient  lives   with daughter and granddaughter Does the patient drive? No longer drives   Neuropsychological evaluation 05/27/22  Briefly, results suggested a primary impairment surrounding both encoding (i.e., learning) and delayed retrieval aspects of verbal and visual memory. Additional weaknesses were exhibited across verbal fluency (i.e., both phonemic and semantic fluency), as well as recognition/consolidation aspects of memory. Notable performance variability was exhibited across processing speed, basic attention, and executive functioning. The etiology for ongoing cognitive dysfunction is most likely multifactorial. The likelihood for a prominent psychiatric contribution appears high. Historically, Lori Phillips has several diagnoses, including major depressive disorder, generalized anxiety disorder with panic attacks, bipolar disorder, and borderline personality disorder. Across mood-related questionnaires, she reported acutely severe symptoms of both anxiety and depression. It is also worth noting that her current family dynamic may be exacerbating mood concerns. At the conclusion of the testing session, Lori Phillips spontaneously reported to the psychometrist that she feels unable to say things and does not have an adequate voice when her daughter and granddaughter are around. She alluded to this negatively impacting her mood and being a primary reason for why she may stay in her room throughout the day and try to avoid interaction. Severe psychiatric distress will certainly worsen cognitive abilities and the areas in which Lori Phillips exhibited weakness and/or variability are certainly reasonable findings in this context. Remote opiate abuse and other substance use, as well as fibromyalgia and symptoms of chronic pain will also affect similar domains and may exacerbate difficulties. Neurologically speaking, Lori Phillips's most recent brain MRIs have revealed age advanced atrophy, as well as cerebrovascular disease  and very small infarcts involving the left corona radiata/internal capsule and left posterior frontal lobe. Given this, there is certainly the potential for a vascular contribution to her current presentation given these findings. Current testing patterns would align with this presentation well.    Initial visit 10/23/2021  How long did patient have memory difficulties?  Patient reports that her memory changes have been present for about 1 year, getting harder to recall new information, new names.   Patient lives with: Daughter and granddaughter. repeats oneself?  "A lot "-repeats questions and comments. She is aware of what is going on. Daughter notes that she can asked the same questions 6 times in 2 hours.  Of note, the daughter reports that the patient has been doing research on dementia,  since her past testing was negative for date.  She has been reading just about every pamphlet she can.  She feels like her mother is manipulating her diagnosis. Disoriented when walking into a room?  Patient denies   Leaving objects in unusual places?  Patient denies   Ambulates  with difficulty?  Her daughter states that the patient "may drag her feet to go to the grocery store, but if she has to take a check to the bank, then she runs ".   Recent falls?  Patient denies   Any head injuries?  Patient denies   History of seizures?   Patient denies   Wandering behavior?  Patient denies   Patient drives?   Patient no longer drives. "I just don't drive" "she is not a good driver, she pays attention to everything but to the  road "-daughter says    Any mood changes such irritability agitation?  "They may be having some problems with my mood "-daughter endorses " It is better with Wellbutrin, more alert and present ".  She has a history of bipolar disorder, but for the last 6 years she has not been on medication because she refuses to go to the doctor, and in the interim, "she expects people to do things for her at all  times ", especially her daughter.  Any history of depression?:  Has a history of depression.  She does not like to interact with people, she has refused several times going to senior centers.  When the pamphlets arrive by mail, she hides them so no one can see them-daughter says. Hallucinations?  Patient denies   Paranoia?  The patient has had a diagnosis of borderline paranoid-schizophrenic (going to [place])  Patient reports that he sleeps well without vivid dreams, REM behavior or sleepwalking    History of sleep apnea?  In the past she used in the past her CPAP, but that since her sinus surgery, she does not need it anymore. Any hygiene concerns?  Endorsed. She doesn't shower enough.  She has bed sores.  She "fakes "her showers especially during the last year Independent of bathing and dressing?  She her daughter has to help her Does the patient needs help with medications?  Daughter is in charge "as she was not doing it right "-daughter said..  Who is in charge of the finances is in charge Any changes in appetite?  Patient denies.  She was losing weight to Trulicity and gabapentin, she is off of those medications, and has increased her weight Patient have trouble swallowing? Patient denies  needs a new esophageal dilatation. Does the patient cook?  Patient denies   Any kitchen accidents such as leaving the stove on? Patient denies   Any headaches?  Patient denies   The double vision? Patient denies   Any focal numbness or tingling?  Patient denies   Chronic back pain Patient denies   Unilateral weakness?  Patient denies   Any tremors?  Patient denies  when nervous or tired or sugars low  Any history of anosmia?  Patient denies   Any incontinence of urine?  Needs diapers "hard to hold it if I had to go ".  Her daughter states that she feels up to the point that she has to go to the bathroom. Any bowel dysfunction?   She has chronic diarrhea.  She also takes Alka-Seltzer daily. History of heavy  alcohol intake?  Patient denies   History of heavy tobacco use?  Patient denies     Pertinent labs March 2023 TSH normal 1.22, T4 0.99, LFT normal, creatinine 1.4, CBC normal, LDL normal, triglycerides elevated 202   MRI of the brain February 2022 without acute or reversible finding, generalized brain atrophy, advanced for age, and further progressive since 2016.  Mild chronic small vessel change of the white matter.  No abnormalities in brainstem or cerebellum.  No hydrocephalus  MRI brain  10/2021 personally reviewed without  intracranial abnormality. 2. Tiny chronic cortical infarct within the posterior left frontal lobe, new from the prior MRI of 06/02/2020. 3. Redemonstrated chronic lacunar infarct within the left corona radiata/internal capsule. 4. Background mild cerebral white matter chronic small vessel ischemic disease, stable. 5. Age advanced generalized parenchymal atrophy, also stable.     Past Medical History:  Diagnosis Date   Abnormal urine 10/02/2021   Acquired  hypothyroidism 11/26/2009   Last Assessment & Plan: TSH low when last checked. Advised follow up labs and to follow up with Dr. Cruzita Lederer. The patient indicates understanding of these issues and agrees with the plan.   Atherosclerotic heart disease of native coronary artery without angina pectoris 11/26/2009   Last Assessment & Plan: Quiet. On ASA, betablocker and statin.   Bipolar disorder, unspecified 11/26/2009   Last Assessment & Plan: Stable on current rx.   Borderline personality disorder    CAD (coronary artery disease)    multiple caths   Chronic pain syndrome 02/21/2010   Formatting of this note might be different from the original. Last Assessment & Plan: Followed by Dr. Primus Bravo. Yes, I feel she can stop ASA 5 days prior if necessary to procedure. Form will be faxed back to him.   DDD (degenerative disc disease), lumbar 09/28/2014   Enlarged liver    Enlarged pituitary gland    Essential hypertension  11/26/2009   Fibromyalgia    Generalized anxiety disorder with panic attacks 11/26/2009   GERD (gastroesophageal reflux disease)    History of opioid abuse    Sober since 2016   History of suburethral sling procedure 12/13/2015   Hyperlipidemia due to type 2 diabetes mellitus 06/17/2017   Incomplete emptying of bladder 01/31/2016   Low back pain    Major depressive disorder    Mild cognitive impairment, multifactorial 05/20/2022   Multiple lacunar infarcts 11/03/2021   left corona radiata/internal capsule; posterior left frontal lobe.    Neck pain 08/14/2014   Nephrolithiasis    Osteoarthritis    Osteoporosis    Overactive detrusor 01/31/2016   Parkinsonism 2016   tremor; resolved after antipsychotics were discontinued; normal DaTscan   Personal history of colonic polyps 10/03/2011   2005 3 polyps - no records  2007 no polyps  10/03/2011-5 diminutive polyps, 4 recovered - adenomas      Proteinuria, unspecified 10/02/2021   Rheumatoid arthritis    Sacroiliac joint disease 09/24/2014   Scoliosis    Simple renal cyst 11/05/2021   Stage 3b chronic kidney disease 07/11/2020   SUI (stress urinary incontinence, female) 01/31/2016   Type 2 diabetes mellitus with diabetic polyneuropathy, without long-term current use of insulin 10/01/2011   Urinary incontinence 12/13/2015   Venous insufficiency 06/03/2018     Past Surgical History:  Procedure Laterality Date   BLADDER SURGERY     ?bladder sling    CARDIAC CATHETERIZATION     x3   CATARACT EXTRACTION, BILATERAL     COLONOSCOPY  multiple   2005 [place] records not available-polyps x 3   COLONOSCOPY WITH PROPOFOL N/A 10/12/2015   Procedure: COLONOSCOPY WITH PROPOFOL;  Surgeon: Lollie Sails, MD;  Location[facility] ENDOSCOPY;  Service: Endoscopy;  Laterality: N/A;   COLONOSCOPY WITH PROPOFOL N/A 04/29/2021   Procedure: COLONOSCOPY WITH PROPOFOL;  Surgeon: [name], MD;  Location: [facility] ENDOSCOPY;  Service: Endoscopy;   Laterality: N/A;  DM   ESOPHAGEAL DILATION     kidney stone removal     TONSILLECTOMY     VAGINAL HYSTERECTOMY       PREVIOUS MEDICATIONS:   CURRENT MEDICATIONS:  Outpatient Encounter Medications as of 06/02/2022  Medication Sig   acetaminophen (TYLENOL) 650 MG CR tablet Take 650 mg by mouth 3 (three) times daily as needed for pain.   buPROPion (WELLBUTRIN XL) 300 MG 24 hr tablet Take 1 tablet (300 mg total) by mouth daily.   diltiazem (CARDIZEM CD) 180 MG 24  hr capsule TAKE 1 CAPSULE(180 MG) BY MOUTH DAILY   divalproex (DEPAKOTE) 500 MG DR tablet TAKE 1 TABLET(500 MG) BY MOUTH TWICE DAILY   glipiZIDE (GLUCOTROL) 5 MG tablet TAKE 1 TABLET(5 MG) BY MOUTH DAILY BEFORE BREAKFAST   glucose blood (ONETOUCH VERIO) test strip Use to check blood sugar once a day. Dx Code E11.40   levothyroxine (SYNTHROID) 100 MCG tablet TAKE 1 TABLET BY MOUTH DAILY BEFORE BREAKFAST   lisinopril (ZESTRIL) 5 MG tablet TAKE 1 TABLET(5 MG) BY MOUTH DAILY   metFORMIN (GLUCOPHAGE) 500 MG tablet TAKE 1 TABLET(500 MG) BY MOUTH TWICE DAILY WITH A MEAL   OneTouch Delica Lancets 99991111 MISC 1 each by Does not apply route daily. Use to obtain blood sample for blood sugar once a day. Dx Code E11.40   pantoprazole (PROTONIX) 40 MG tablet TAKE 1 TABLET(40 MG) BY MOUTH DAILY   rosuvastatin (CRESTOR) 20 MG tablet TAKE 1 TABLET BY MOUTH DAILY   vitamin B-12 (CYANOCOBALAMIN) 500 MCG tablet Take 500 mcg by mouth at bedtime.    VITAMIN D PO Take by mouth.   [DISCONTINUED] memantine (NAMENDA) 5 MG tablet Take 1 tablet (5 mg at night) for 2 weeks, then increase to 1 tablet (5 mg) twice a day   memantine (NAMENDA) 10 MG tablet Take 1 tablet  twice a day   [DISCONTINUED] TAZTIA XT 180 MG 24 hr capsule TAKE ONE CAPSULE BY MOUTH EVERY DAY   No facility-administered encounter medications on file as of 06/02/2022.     Objective:     PHYSICAL EXAMINATION:    VITALS:   Vitals:   06/02/22 1426 06/02/22 1505  BP: (!) 186/72 (!) 193/69   Pulse: (!) 114   Resp: 18   SpO2: 100%   Weight: 130 lb (59 kg)   Height: 5' 4.5" (1.638 m)     GEN:  The patient appears stated age and is in NAD. HEENT:  Normocephalic, atraumatic.   Neurological examination:  General: NAD, well-groomed, appears stated age. Orientation: The patient is alert. Oriented to person, place and date Cranial nerves: There is good facial symmetry.The speech is fluent and clear. No aphasia or dysarthria. Fund of knowledge is appropriate. Recent memory impaired and remote memory is normal.  Attention and concentration are normal.  Able to name objects and repeat phrases.  Hearing is intact to conversational tone.    Sensation: Sensation is intact to light touch throughout Motor: Strength is at least antigravity x4. Tremors: minimal DTR's 2/4 in UE/LE      10/23/2021   12:00 PM 12/15/2014   11:19 AM  Montreal Cognitive Assessment   Visuospatial/ Executive (0/5) 3 3  Naming (0/3) 3 3  Attention: Read list of digits (0/2) 2 2  Attention: Read list of letters (0/1) 1 1  Attention: Serial 7 subtraction starting at 100 (0/3) 3 3  Language: Repeat phrase (0/2) 2 2  Language : Fluency (0/1) 0 0  Abstraction (0/2) 2 0  Delayed Recall (0/5) 0 0  Orientation (0/6) 6 6  Total 22 20  Adjusted Score (based on education) 23 21       10/02/2016   11:10 AM  MMSE - Mini Mental State Exam  Orientation to time 5  Orientation to Place 5  Registration 3  Attention/ Calculation 0  Recall 1  Recall-comments pt was unable to recall 2 of 3 words  Language- name 2 objects 0  Language- repeat 1  Language- follow 3 step command 3  Language- read &  follow direction 0  Write a sentence 0  Copy design 0  Total score 18       Movement examination: Tone: There is normal tone in the UE/LE Abnormal movements:  no tremor.  No myoclonus.  No asterixis.   Coordination:  There is no decremation with RAM's. Normal finger to nose  Gait and Station: The patient has no  difficulty arising out of a deep-seated chair without the use of the hands. The patient's stride length is good.  Gait is cautious and narrow.   Thank you for allowing [place] the opportunity to participate in the care of this nice patient. Please do not hesitate to contact us for any questions or concerns.   Total time spent on today's visit was 30 minutes dedicated to this patient today, preparing to see patient, examining the patient, ordering tests and/or medications and counseling the patient, documenting clinical information in the EHR or other health record, independently interpreting results and communicating results to the patient/family, discussing treatment and goals, answering patient's questions and coordinating care.  Cc:  Venia Carbon, MD  Sharene Butters 06/02/2022 3:36 PM

## 2022-06-02 ENCOUNTER — Emergency Department: Payer: PPO

## 2022-06-02 ENCOUNTER — Ambulatory Visit (INDEPENDENT_AMBULATORY_CARE_PROVIDER_SITE_OTHER): Payer: PPO | Admitting: Physician Assistant

## 2022-06-02 ENCOUNTER — Other Ambulatory Visit: Payer: Self-pay

## 2022-06-02 ENCOUNTER — Emergency Department
Admission: EM | Admit: 2022-06-02 | Discharge: 2022-06-03 | Disposition: A | Discharge status: Home / Self Care | Payer: PPO | Attending: Emergency Medicine | Admitting: Emergency Medicine

## 2022-06-02 ENCOUNTER — Encounter: Payer: Self-pay | Admitting: Physician Assistant

## 2022-06-02 VITALS — BP 193/69 | HR 114 | Resp 18 | Ht 64.5 in | Wt 130.0 lb

## 2022-06-02 DIAGNOSIS — E039 Hypothyroidism, unspecified: Secondary | ICD-10-CM | POA: Insufficient documentation

## 2022-06-02 DIAGNOSIS — R0789 Other chest pain: Secondary | ICD-10-CM | POA: Diagnosis not present

## 2022-06-02 DIAGNOSIS — I10 Essential (primary) hypertension: Secondary | ICD-10-CM | POA: Diagnosis not present

## 2022-06-02 DIAGNOSIS — R918 Other nonspecific abnormal finding of lung field: Secondary | ICD-10-CM | POA: Diagnosis not present

## 2022-06-02 DIAGNOSIS — I6381 Other cerebral infarction due to occlusion or stenosis of small artery: Secondary | ICD-10-CM

## 2022-06-02 DIAGNOSIS — R9389 Abnormal findings on diagnostic imaging of other specified body structures: Secondary | ICD-10-CM

## 2022-06-02 DIAGNOSIS — N281 Cyst of kidney, acquired: Secondary | ICD-10-CM | POA: Diagnosis not present

## 2022-06-02 DIAGNOSIS — I700 Atherosclerosis of aorta: Secondary | ICD-10-CM | POA: Diagnosis not present

## 2022-06-02 DIAGNOSIS — G3184 Mild cognitive impairment, so stated: Secondary | ICD-10-CM | POA: Diagnosis not present

## 2022-06-02 DIAGNOSIS — J8410 Pulmonary fibrosis, unspecified: Secondary | ICD-10-CM | POA: Diagnosis not present

## 2022-06-02 DIAGNOSIS — R519 Headache, unspecified: Secondary | ICD-10-CM | POA: Diagnosis not present

## 2022-06-02 DIAGNOSIS — I2782 Chronic pulmonary embolism: Secondary | ICD-10-CM | POA: Diagnosis not present

## 2022-06-02 DIAGNOSIS — E119 Type 2 diabetes mellitus without complications: Secondary | ICD-10-CM | POA: Insufficient documentation

## 2022-06-02 DIAGNOSIS — R911 Solitary pulmonary nodule: Secondary | ICD-10-CM | POA: Diagnosis not present

## 2022-06-02 DIAGNOSIS — R079 Chest pain, unspecified: Secondary | ICD-10-CM | POA: Diagnosis not present

## 2022-06-02 DIAGNOSIS — R1013 Epigastric pain: Secondary | ICD-10-CM | POA: Diagnosis not present

## 2022-06-02 LAB — BASIC METABOLIC PANEL
Anion gap: 10 (ref 5–15)
BUN: 31 mg/dL — ABNORMAL HIGH (ref 8–23)
CO2: 26 mmol/L (ref 22–32)
Calcium: 9.6 mg/dL (ref 8.9–10.3)
Chloride: 103 mmol/L (ref 98–111)
Creatinine, Ser: 1.07 mg/dL — ABNORMAL HIGH (ref 0.44–1.00)
GFR, Estimated: 57 mL/min — ABNORMAL LOW (ref >60)
Glucose, Bld: 114 mg/dL — ABNORMAL HIGH (ref 70–99)
Potassium: 4 mmol/L (ref 3.5–5.1)
Sodium: 139 mmol/L (ref 135–145)

## 2022-06-02 LAB — HEPATIC FUNCTION PANEL
ALT: 14 U/L (ref 0–44)
AST: 16 U/L (ref 15–41)
Albumin: 4 g/dL (ref 3.5–5.0)
Alkaline Phosphatase: 52 U/L (ref 38–126)
Bilirubin, Direct: <0.1 mg/dL (ref 0.0–0.2)
Total Bilirubin: 0.6 mg/dL (ref 0.3–1.2)
Total Protein: 6.8 g/dL (ref 6.5–8.1)

## 2022-06-02 LAB — CBC
HCT: 37.2 % (ref 36.0–46.0)
Hemoglobin: 12 g/dL (ref 12.0–15.0)
MCH: 30.6 pg (ref 26.0–34.0)
MCHC: 32.3 g/dL (ref 30.0–36.0)
MCV: 94.9 fL (ref 80.0–100.0)
Platelets: 206 10*3/uL (ref 150–400)
RBC: 3.92 MIL/uL (ref 3.87–5.11)
RDW: 12.7 % (ref 11.5–15.5)
WBC: 6.5 10*3/uL (ref 4.0–10.5)
nRBC: 0 % (ref 0.0–0.2)

## 2022-06-02 LAB — TROPONIN I (HIGH SENSITIVITY)
Troponin I (High Sensitivity): 6 ng/L (ref <18)
Troponin I (High Sensitivity): 6 ng/L (ref <18)

## 2022-06-02 LAB — LIPASE, BLOOD: Lipase: 38 U/L (ref 11–51)

## 2022-06-02 MED ORDER — PANTOPRAZOLE SODIUM 40 MG IV SOLR
40.0000 mg | Freq: Once | INTRAVENOUS | Status: AC
  Administered 2022-06-02: 40 mg via INTRAVENOUS

## 2022-06-02 MED ORDER — MEMANTINE HCL 10 MG PO TABS: ORAL_TABLET | ORAL | 11 refills | Status: DC

## 2022-06-02 MED ORDER — IOHEXOL 350 MG/ML SOLN
100.0000 mL | Freq: Once | INTRAVENOUS | Status: AC | PRN
  Administered 2022-06-02: 100 mL via INTRAVENOUS

## 2022-06-02 MED ORDER — ONDANSETRON HCL 4 MG/2ML IJ SOLN
4.0000 mg | Freq: Once | INTRAMUSCULAR | Status: AC
  Administered 2022-06-02: 4 mg via INTRAVENOUS
  Filled 2022-06-02: qty 2

## 2022-06-02 MED ORDER — MORPHINE SULFATE (PF) 4 MG/ML IV SOLN
4.0000 mg | Freq: Once | INTRAVENOUS | Status: AC
  Administered 2022-06-02: 4 mg via INTRAVENOUS
  Filled 2022-06-02: qty 1

## 2022-06-02 NOTE — Patient Instructions (Signed)
It was a pleasure to see you today at our office.   Recommendations:   Recommend to establish care with a psychiatrist and behavioral therapist  Attend [facility] Memantine will be increased to 10 mg twice daily.  Continue B12  Follow up in 6 months  Whom to call:  Memory  decline, memory medications: Call our office [phone]   For psychiatric meds, mood meds: Please have your primary care physician manage these medications.   Counseling regarding caregiver distress, including caregiver depression, anxiety and issues regarding community resources, adult day care programs, adult living facilities, or memory care questions:   Feel free to contact Leon, Social Worker at [phone]   For assessment of decision of mental capacity and competency:  Call Dr. Anthoney Harada, geriatric psychiatrist at [phone]  For guidance in geriatric dementia issues please call [facility] [phone]  For guidance regarding [facility] and if placement were needed at the facility, contact Arnell Asal, Social Worker tel: [phone]  If you have any severe symptoms of a stroke, or other severe issues such as confusion,severe chills or fever, etc call 911 or go to the ER as you may need to be evaluated further     Consider [facility]  [place], [place] [id number] [phone]  Hours of Operation Mondays to Thursdays: 8 am to 8 pm,Fridays: 9 am to 8 pm, Saturdays: 9 am to 1 pm Sundays: Closed  [email or web address]    RECOMMENDATIONS FOR ALL PATIENTS WITH MEMORY PROBLEMS: 1. Continue to exercise (Recommend 30 minutes of walking everyday, or 3 hours every week) 2. Increase social interactions - continue going to [place] and enjoy social gatherings with friends and family 3. Eat healthy, avoid fried foods and eat more fruits and  vegetables 4. Maintain adequate blood pressure, blood sugar, and blood cholesterol level. Reducing the risk of stroke and cardiovascular disease also helps promoting better memory. 5. Avoid stressful situations. Live a simple life and avoid aggravations. Organize your time and prepare for the next day in anticipation. 6. Sleep well, avoid any interruptions of sleep and avoid any distractions in the bedroom that may interfere with adequate sleep quality 7. Avoid sugar, avoid sweets as there is a strong link between excessive sugar intake, diabetes, and cognitive impairment We discussed the Mediterranean diet, which has been shown to help patients reduce the risk of progressive memory disorders and reduces cardiovascular risk. This includes eating fish, eat fruits and green leafy vegetables, nuts like almonds and hazelnuts, walnuts, and also use olive oil. Avoid fast foods and fried foods as much as possible. Avoid sweets and sugar as sugar use has been linked to worsening of memory function.  There is always a concern of gradual progression of memory problems. If this is the case, then we may need to adjust level of care according to patient needs. Support, both to the patient and caregiver, should then be put into place.        FALL PRECAUTIONS: Be cautious when walking. Scan the area for obstacles that may increase the risk of trips and falls. When getting up in the mornings, sit up at the edge of the bed for a few minutes before getting out of bed. Consider elevating the bed at the head end to avoid drop of blood pressure when getting up. Walk always in a well-lit room (use night lights in the walls). Avoid area rugs or power cords from appliances in the middle of the  walkways. Use a walker or a cane if necessary and consider physical therapy for balance exercise. Get your eyesight checked regularly.  FINANCIAL OVERSIGHT: Supervision, especially oversight when making financial decisions or  transactions is also recommended.  HOME SAFETY: Consider the safety of the kitchen when operating appliances like stoves, microwave oven, and blender. Consider having supervision and share cooking responsibilities until no longer able to participate in those. Accidents with firearms and other hazards in the house should be identified and addressed as well.   ABILITY TO BE LEFT ALONE: If patient is unable to contact 911 operator, consider using LifeLine, or when the need is there, arrange for someone to stay with patients. Smoking is a fire hazard, consider supervision or cessation. Risk of wandering should be assessed by caregiver and if detected at any point, supervision and safe proof recommendations should be instituted.  MEDICATION SUPERVISION: Inability to self-administer medication needs to be constantly addressed. Implement a mechanism to ensure safe administration of the medications.   DRIVING: Regarding driving, in patients with progressive memory problems, driving will be impaired. We advise to have someone else do the driving if trouble finding directions or if minor accidents are reported. Independent driving assessment is available to determine safety of driving.   If you are interested in the driving assessment, you can contact the following:  [employer] in [place]  [place] [phone] or [phone]    [place] refers to food and lifestyle choices that are based on the traditions of countries located on the [employer]. This way of eating has been shown to help prevent certain conditions and improve outcomes for people who have chronic diseases, like kidney disease and heart disease. What are tips for following this plan? Lifestyle  Cook and eat meals together with your family, when possible. Drink enough fluid to  keep your urine clear or pale yellow. Be physically active every day. This includes: Aerobic exercise like running or swimming. Leisure activities like gardening, walking, or housework. Get 7-8 hours of sleep each night. If recommended by your health care provider, drink red wine in moderation. This means 1 glass a day for nonpregnant women and 2 glasses a day for men. A glass of wine equals 5 oz (150 mL). Reading food labels  Check the serving size of packaged foods. For foods such as rice and pasta, the serving size refers to the amount of cooked product, not dry. Check the total fat in packaged foods. Avoid foods that have saturated fat or trans fats. Check the ingredients list for added sugars, such as corn syrup. Shopping  At the grocery store, buy most of your food from the areas near the walls of the store. This includes: Fresh fruits and vegetables (produce). Grains, beans, nuts, and seeds. Some of these may be available in unpackaged forms or large amounts (in bulk). Fresh seafood. Poultry and eggs. Low-fat dairy products. Buy whole ingredients instead of prepackaged foods. Buy fresh fruits and vegetables in-season from local farmers markets. Buy frozen fruits and vegetables in resealable bags. If you do not have access to quality fresh seafood, buy precooked frozen shrimp or canned fish, such as tuna, salmon, or sardines. Buy small amounts of raw or cooked vegetables, salads, or olives from the deli or salad bar at your store. Stock your pantry so you always have certain foods on hand, such as olive oil, canned tuna, canned tomatoes,  rice, pasta, and beans. Cooking  Cook foods with extra-virgin olive oil instead of using butter or other vegetable oils. Have meat as a side dish, and have vegetables or grains as your main dish. This means having meat in small portions or adding small amounts of meat to foods like pasta or stew. Use beans or vegetables instead of meat in common  dishes like chili or lasagna. Experiment with different cooking methods. Try roasting or broiling vegetables instead of steaming or sauteing them. Add frozen vegetables to soups, stews, pasta, or rice. Add nuts or seeds for added healthy fat at each meal. You can add these to yogurt, salads, or vegetable dishes. Marinate fish or vegetables using olive oil, lemon juice, garlic, and fresh herbs. Meal planning  Plan to eat 1 vegetarian meal one day each week. Try to work up to 2 vegetarian meals, if possible. Eat seafood 2 or more times a week. Have healthy snacks readily available, such as: Vegetable sticks with hummus. Greek yogurt. Fruit and nut trail mix. Eat balanced meals throughout the week. This includes: Fruit: 2-3 servings a day Vegetables: 4-5 servings a day Low-fat dairy: 2 servings a day Fish, poultry, or lean meat: 1 serving a day Beans and legumes: 2 or more servings a week Nuts and seeds: 1-2 servings a day Whole grains: 6-8 servings a day Extra-virgin olive oil: 3-4 servings a day Limit red meat and sweets to only a few servings a month What are my food choices? Mediterranean diet Recommended Grains: Whole-grain pasta. Brown rice. Bulgar wheat. Polenta. Couscous. Whole-wheat bread. [place]. Vegetables: Artichokes. Beets. Broccoli. Cabbage. Carrots. Eggplant. Green beans. Chard. Kale. Spinach. Onions. Leeks. Peas. Squash. Tomatoes. Peppers. Radishes. Fruits: Apples. Apricots. Avocado. Berries. Bananas. Cherries. Dates. Figs. Grapes. Lemons. Melon. Oranges. Peaches. Plums. Pomegranate. Meats and other protein foods: Beans. Almonds. Sunflower seeds. Pine nuts. Peanuts. [place]. Salmon. Scallops. Shrimp. [place]. Tilapia. Clams. Oysters. Eggs. Dairy: Low-fat milk. Cheese. Greek yogurt. Beverages: Water. Red wine. Herbal tea. Fats and oils: Extra virgin olive oil. Avocado oil. Grape seed oil. Sweets and desserts: [place] yogurt with honey. Baked apples. Poached pears. Trail  mix. Seasoning and other foods: Basil. Cilantro. Coriander. Cumin. Mint. Parsley. Sage. Rosemary. Tarragon. Garlic. Oregano. Thyme. Pepper. Balsalmic vinegar. Tahini. Hummus. Tomato sauce. Olives. Mushrooms. Limit these Grains: Prepackaged pasta or rice dishes. Prepackaged cereal with added sugar. Vegetables: Deep fried potatoes (french fries). Fruits: Fruit canned in syrup. Meats and other protein foods: Beef. Pork. Lamb. Poultry with skin. Hot dogs. Berniece Salines. Dairy: Ice cream. Sour cream. Whole milk. Beverages: Juice. Sugar-sweetened soft drinks. Beer. Liquor and spirits. Fats and oils: Butter. Canola oil. Vegetable oil. Beef fat (tallow). Lard. Sweets and desserts: Cookies. Cakes. Pies. Candy. Seasoning and other foods: Mayonnaise. Premade sauces and marinades. The items listed may not be a complete list. Talk with your dietitian about what dietary choices are right for you. Summary The Mediterranean diet includes both food and lifestyle choices. Eat a variety of fresh fruits and vegetables, beans, nuts, seeds, and whole grains. Limit the amount of red meat and sweets that you eat. Talk with your health care provider about whether it is safe for you to drink red wine in moderation. This means 1 glass a day for nonpregnant women and 2 glasses a day for men. A glass of wine equals 5 oz (150 mL). This information is not intended to replace advice given to you by your health care provider. Make sure you discuss any questions you have with your health  care provider. Document Released: 11/29/2015 Document Revised: 01/01/2016 Document Reviewed: 11/29/2015 Elsevier Interactive Patient Education  2017 [employer].

## 2022-06-02 NOTE — ED Triage Notes (Signed)
Pt to ED from home c/o HTN and central chest tightness today.  States had follow up with neurologist today and found to be Hypertensive, told to go home and keep an eye on it, BP was 181/110.  Pt A&Ox4, chest rise even and unlabored, skin WNL and in NAD at this time.  Daughter states hx of 2 mini strokes in the past.

## 2022-06-02 NOTE — ED Notes (Signed)
Patient transported to CT 

## 2022-06-02 NOTE — ED Provider Notes (Signed)
[facility] Provider Note    Event Date/Time   First MD Initiated Contact with Patient 06/02/22 2141     (approximate)   History   Hypertension   HPI  Lori Phillips is a 67 y.o. female with hypertension, hyperlipidemia, diabetes, anxiety with panic attacks, bipolar depression fibromyalgia hypothyroidism rheumatoid arthritis B12 deficiency hepatomegaly who comes in with concerns for elevated blood pressure.  On review of records patient was seen by neurology has some known chronic infarcts.  Her blood pressure was noted to be elevated at the neurology office and they told her to followed up with her primary care doctor.  I reviewed a blood pressure from 11/27/2021 where her blood pressure was 181/80.  She is on Cardizem 180 lisinopril 5.  Patient reports that she has been taking her blood pressure medication.  She does report developing some chest pain after she saw her neurologist.  She reports is a 5 out of 10 no severe headaches but she reports the pain, radiates into her upper abdomen.  She reports it is a pressure feeling denies ever having anything like this previously which is why she came into the emergency room to be evaluated.  Physical Exam   Triage Vital Signs: ED Triage Vitals [06/02/22 1953]  Enc Vitals Group     BP (!) 215/80     Pulse Rate 84     Resp 16     Temp 98.4 F (36.9 C)     Temp Source Oral     SpO2 98 %     Weight 130 lb (59 kg)     Height 5' 6"$  (1.676 m)     Head Circumference      Peak Flow      Pain Score 4     Pain Loc      Pain Edu?      Excl. in [place]?     Most recent vital signs: Vitals:   06/02/22 2106 06/02/22 2107  BP:  (!) 198/80  Pulse:  60  Resp:  20  Temp:    SpO2: 100% 100%     General: Awake, no distress.  CV:  Good peripheral perfusion.  Resp:  Normal effort.  Abd:  No distention.  Other:  Mild epigastric tenderness.  No rebound or guarding.   ED Results / Procedures / Treatments   Labs (all  labs ordered are listed, but only abnormal results are displayed) Labs Reviewed  BASIC METABOLIC PANEL - Abnormal; Notable for the following components:      Result Value   Glucose, Bld 114 (*)    BUN 31 (*)    Creatinine, Ser 1.07 (*)    GFR, Estimated 57 (*)    All other components within normal limits  CBC  TROPONIN I (HIGH SENSITIVITY)  TROPONIN I (HIGH SENSITIVITY)     EKG  My interpretation of EKG:  Normal sinus rate 62 without any ST elevation or T wave inversions, normal intervals  RADIOLOGY I have reviewed the xray personally and interpreted and no pneumonia   PROCEDURES:  Critical Care performed: No  .1-3 Lead EKG Interpretation  Performed by: Vanessa [place], MD Authorized by: Vanessa [place], MD     Interpretation: normal     ECG rate:  60   ECG rate assessment: normal     Rhythm: sinus rhythm     Ectopy: none      MEDICATIONS ORDERED IN ED: Medications  iohexol (OMNIPAQUE) 350 MG/ML  injection 100 mL (has no administration in time range)  morphine (PF) 4 MG/ML injection 4 mg (4 mg Intravenous Given 06/02/22 2227)  ondansetron (ZOFRAN) injection 4 mg (4 mg Intravenous Given 06/02/22 2225)  pantoprazole (PROTONIX) injection 40 mg (40 mg Intravenous Given 06/02/22 2227)     IMPRESSION / MDM / ASSESSMENT AND PLAN / ED COURSE  I reviewed the triage vital signs and the nursing notes.   Patient's presentation is most consistent with acute presentation with potential threat to life or bodily function.   Patient comes in hypertensive with chest pain.  Cardiac markers ordered evaluate for ACS CT dissection evaluate for dissection, CT head to make sure no intercranial hemorrhage.  Patient given some morphine, Zofran to help with symptoms.  Also added on ultrasound to make sure is not her gallbladder.  Will hold off on lowering blood pressure we discussed the risk of acutely lowering blood pressure they expressed understanding.    BMP shows creatinine around  baseline CBC normal initial troponin negative  Patient added off to oncoming team pending CT ultrasound and reassessment  The patient is on the cardiac monitor to evaluate for evidence of arrhythmia and/or significant heart rate changes.  Clinical Course as of 06/03/22 0003  Mon Jun 02, 2022  2347 CT HEAD WO CONTRAST (5MM) I viewed and interpreted the patient's CT head without contrast.  I see no evidence of acute bleed or mass.  I also read the radiologist's report, which confirmed no acute findings. [CF]  2348 Troponins within normal limits x 2.  The rest of the lab results are all reassuring [CF]  Tue Jun 03, 2022  0002 CT HEAD WO CONTRAST (5MM) [MF]    Clinical Course User Index [CF] Hinda Kehr, MD [MF] Vanessa Beaulieu, MD     FINAL CLINICAL IMPRESSION(S) / ED DIAGNOSES   Final diagnoses:  Hypertension, unspecified type  Chest pain, unspecified type     Rx / DC Orders   ED Discharge Orders     None        Note:  This document was prepared using Dragon voice recognition software and may include unintentional dictation errors.   Vanessa , MD 06/02/22 2300

## 2022-06-02 NOTE — ED Notes (Signed)
Pt returned from CT °

## 2022-06-02 NOTE — ED Notes (Signed)
Pt ambulatory to in-room toilet with steady gait.

## 2022-06-02 NOTE — ED Provider Notes (Signed)
----------------------------------------- 11:10 PM on 06/02/2022 -----------------------------------------  Assuming care from Dr. Jari Pigg.  In short, Lori Phillips is a 67 y.o. female with a chief complaint of hypertension with some atypical chest pain.  Refer to the original H&P for additional details.  The current plan of care is to follow-up on CT scans and ultrasound (patient also had some upper abdominal pain) and reassess.  If workup is reassuring, anticipate outpatient follow-up with PCP to discuss uncontrolled hypertension.   Clinical Course as of 06/03/22 0128  Mon Jun 02, 2022  2347 CT HEAD WO CONTRAST (5MM) I viewed and interpreted the patient's CT head without contrast.  I see no evidence of acute bleed or mass.  I also read the radiologist's report, which confirmed no acute findings. [CF]  2348 Troponins within normal limits x 2.  The rest of the lab results are all reassuring [CF]  Tue Jun 03, 2022  0002 CT HEAD WO CONTRAST (5MM) [MF]  0009 US ABDOMEN LIMITED RUQ (LIVER/GB) I viewed and interpreted the patient's abdominal ultrasound I see no evidence of cholelithiasis nor cholecystitis.  I also read the radiologist's report, which confirmed no acute findings. [CF]  0121 I viewed and interpreted the patient's CTA chest.  I could see no evidence of dissection.  The radiologist mentioned a chronic pulmonary embolism of the right lower lobe, which I do not see listed anywhere in her history.  I went back and reassessed the patient.  She feels fine at this time and is asymptomatic.  She reiterated that she had some discomfort in the center of her chest previously.  Neither she nor her daughter remember being told that she ever had a pulmonary embolism and she is not on anticoagulation.  We talked about her blood pressure, her generally reassuring workup, the abnormal CT scan, etc.  I explained that I could start her on anticoagulation but I do not feel it is indicated and is certainly  not without risk.  She has a good relationship with her PCP and think she can follow-up in a short period of time.  I encouraged her to follow-up and discuss the abnormal CT scan with him and talk about the pros and cons (risk/benefits) of starting anticoagulation for questionable PE of unclear chronicity.  She will also talk with Dr. Silvio Pate about blood pressure control if she continues to be hypertensive; she and her daughter said that she has been well-controlled for a long time and this is a relatively new issue.  Additionally, I will put in the outpatient referral to cardiology to facilitate close follow-up.  I gave my usual and customary return precautions and she and her daughter are comfortable with the plan for discharge.  I initially considered hospitalization given the patient's age and constellation of multiple concerns, but she seems hemodynamically stable, has no ongoing pain, and has had a reassuring exam and period of observation over about the last 5 and half hours and I feel she is appropriate for outpatient follow-up. [CF]    Clinical Course User Index [CF] Hinda Kehr, MD [MF] Vanessa [place], MD     Medications  morphine (PF) 4 MG/ML injection 4 mg (4 mg Intravenous Given 06/02/22 2227)  ondansetron [facility]) injection 4 mg (4 mg Intravenous Given 06/02/22 2225)  pantoprazole (PROTONIX) injection 40 mg (40 mg Intravenous Given 06/02/22 2227)  iohexol (OMNIPAQUE) 350 MG/ML injection 100 mL (100 mLs Intravenous Contrast Given 06/02/22 2314)     ED Discharge Orders  Ordered    Ambulatory referral to Cardiology       Comments: If you have not heard from the Cardiology office within the next 72 hours please call [phone].   06/03/22 0125           Final diagnoses:  Uncontrolled hypertension  Atypical chest pain  Abnormal chest CT     Hinda Kehr, MD 06/03/22 0128

## 2022-06-03 ENCOUNTER — Telehealth: Payer: Self-pay

## 2022-06-03 ENCOUNTER — Telehealth: Payer: Self-pay | Admitting: Internal Medicine

## 2022-06-03 DIAGNOSIS — D509 Iron deficiency anemia, unspecified: Secondary | ICD-10-CM

## 2022-06-03 NOTE — Telephone Encounter (Signed)
Left message on VM checking to see how she is doing after her recent ER visit on 06-02-22 for elevated BP. I asked that she call me with an update if she could and Dr Silvio Pate wants her to schedule an OV with him soon for a routine follow-up.

## 2022-06-03 NOTE — Telephone Encounter (Signed)
Patient returned phone call, I went ahead and scheduled he follow up appointment with DR Marcelyn Bruins she will be waiting on a phone call to check on her after her ER visit.

## 2022-06-03 NOTE — Telephone Encounter (Signed)
Pt is asking if she should refill the memantine or wait until she sees Dr Silvio Pate on 06-06-22.

## 2022-06-03 NOTE — Telephone Encounter (Signed)
Patient called in and stated that her Psychiatry referral need to be sent over to [facility] and there phone number is [phone]. Thank you!

## 2022-06-03 NOTE — Telephone Encounter (Signed)
Transition Care Management Follow-up Telephone Call Date of discharge and from where: 06/02/22 How have you been since you were released from the hospital? She said her chest is not hurting like it was yesterday. Any questions or concerns? Yes. She is seeing a neurologist who has put her on memantine 67m. Wanting to increase it. ER thinks that could have been one of the issues that sent her to the ER.  Items Reviewed: Did the pt receive and understand the discharge instructions provided? Yes  Medications obtained and verified? No  Other? No  Any new allergies since your discharge? No  Dietary orders reviewed? No Do you have support at home? Yes   Home Care and Equipment/Supplies: Were home health services ordered? not applicable If so, what is the name of the agency?   Has the agency set up a time to come to the patient's home? not applicable Were any new equipment or medical supplies ordered?  No What is the name of the medical supply agency?  Were you able to get the supplies/equipment? not applicable Do you have any questions related to the use of the equipment or supplies? No  Functional Questionnaire: (I = Independent and D = Dependent) ADLs: I  Bathing/Dressing- I  Meal Prep- I  Eating- I  Maintaining continence- I  Transferring/Ambulation- I  Managing Meds- I  Follow up appointments reviewed:  PCP Hospital f/u appt confirmed? Yes  Scheduled to see Dr LSilvio Pateon 06-06-22 @ 930. [facility]   Are transportation arrangements needed? No  If their condition worsens, is the pt aware to call PCP or go to the Emergency Dept.? Yes Was the patient provided with contact information for the PCP's office or ED? Yes Was to pt encouraged to call back with questions or concerns? Yes

## 2022-06-03 NOTE — Discharge Instructions (Signed)
As we discussed, your evaluation was generally reassuring.  Your blood pressure has been high but it is also quite variable, and it is safer for you to follow-up with your primary care doctor and see if your blood pressure is still elevated and work with him regarding your medications and how to best control it then for me to try to adjust her medications in the emergency department tonight.  We put in a consult to cardiology and a representative of the cardiologist's office should call you within the next 1 to 2 days to schedule a follow-up appointment.  Additionally, as we discussed, the radiologist identified an area in the right lower lobe that they feel represents a chronic pulmonary embolism.  This does not seem to correlate clinically with your symptoms tonight and it seems to have been present for an extended period of time.  We discussed the possibility of starting anticoagulation (blood thinners), but given the potential danger of starting blood thinners, especially when they may not be necessary, it is also better for you to talk with Dr. Silvio Pate about this.  He should have access to the radiology report and even the images.  You can also discuss these findings with the cardiologist when he follow-up.    Return to the emergency department if you develop new or worsening symptoms that concern you.

## 2022-06-04 NOTE — Telephone Encounter (Signed)
Spoke to pt. She will wait until her appointment 2-16.

## 2022-06-04 NOTE — Addendum Note (Signed)
Addended by: Viviana Simpler I on: 06/04/2022 07:57 AM   Modules accepted: Orders

## 2022-06-06 ENCOUNTER — Encounter: Payer: Self-pay | Admitting: Internal Medicine

## 2022-06-06 ENCOUNTER — Ambulatory Visit (INDEPENDENT_AMBULATORY_CARE_PROVIDER_SITE_OTHER): Payer: PPO | Admitting: Internal Medicine

## 2022-06-06 VITALS — BP 124/74 | HR 60 | Temp 97.2°F | Ht 64.5 in | Wt 126.0 lb

## 2022-06-06 DIAGNOSIS — I10 Essential (primary) hypertension: Secondary | ICD-10-CM

## 2022-06-06 DIAGNOSIS — N1832 Chronic kidney disease, stage 3b: Secondary | ICD-10-CM

## 2022-06-06 DIAGNOSIS — G3184 Mild cognitive impairment, so stated: Secondary | ICD-10-CM

## 2022-06-06 DIAGNOSIS — F3162 Bipolar disorder, current episode mixed, moderate: Secondary | ICD-10-CM

## 2022-06-06 DIAGNOSIS — R0789 Other chest pain: Secondary | ICD-10-CM

## 2022-06-06 NOTE — Assessment & Plan Note (Signed)
BP Readings from Last 3 Encounters:  06/06/22 124/74  06/03/22 (!) 175/80  06/02/22 (!) 193/69   Elevated in ER but okay here Will not change her meds---diltiazem 180, lisinopril 5 daily

## 2022-06-06 NOTE — Assessment & Plan Note (Signed)
Started on memantine which she thinks didn't help Neurologist wrote for her to increase--but she is reluctant Discussed this---okay to try off it to see if it makes any difference

## 2022-06-06 NOTE — Progress Notes (Signed)
Subjective:    Patient ID: Lori Phillips, female    DOB: 05/25/1955, 67 y.o.   MRN: [id number]  HPI Here with for ER follow up Granddaughter still in the car  4 days ago--got pain in the center of her chest Was just sitting Felt "like a big weight in the center of my chest" Doesn't remember nausea Did note SOB Daughter brought her to ER Results reviewed---troponins negative. Nothing worrisome on EKG CT angio showed non occlusive RLL embolism but no acute findings (just aortic atherosclerosis) Pain did resolve--she doesn't remember how long it lasted  Has had some "pressure" since then--but not severe Some SOB at times No leg swelling now--might have had some last week Does get sense of heart racing at times  Current Outpatient Medications on File Prior to Visit  Medication Sig Dispense Refill   acetaminophen (TYLENOL) 650 MG CR tablet Take 650 mg by mouth 3 (three) times daily as needed for pain.     buPROPion (WELLBUTRIN XL) 300 MG 24 hr tablet Take 1 tablet (300 mg total) by mouth daily. 90 tablet 3   diltiazem (CARDIZEM CD) 180 MG 24 hr capsule TAKE 1 CAPSULE(180 MG) BY MOUTH DAILY 90 capsule 3   divalproex (DEPAKOTE) 500 MG DR tablet TAKE 1 TABLET(500 MG) BY MOUTH TWICE DAILY 180 tablet 3   glipiZIDE (GLUCOTROL) 5 MG tablet TAKE 1 TABLET(5 MG) BY MOUTH DAILY BEFORE BREAKFAST 90 tablet 3   glucose blood (ONETOUCH VERIO) test strip Use to check blood sugar once a day. Dx Code E11.40 100 each 4   levothyroxine (SYNTHROID) 100 MCG tablet TAKE 1 TABLET BY MOUTH DAILY BEFORE BREAKFAST 90 tablet 3   lisinopril (ZESTRIL) 5 MG tablet TAKE 1 TABLET(5 MG) BY MOUTH DAILY 90 tablet 3   memantine (NAMENDA) 10 MG tablet Take 1 tablet  twice a day 60 tablet 11   metFORMIN (GLUCOPHAGE) 500 MG tablet TAKE 1 TABLET(500 MG) BY MOUTH TWICE DAILY WITH A MEAL 180 tablet 3   OneTouch Delica Lancets 99991111 MISC 1 each by Does not apply route daily. Use to obtain blood sample for blood sugar once a day.  Dx Code E11.40 100 each 3   pantoprazole (PROTONIX) 40 MG tablet TAKE 1 TABLET(40 MG) BY MOUTH DAILY 90 tablet 3   rosuvastatin (CRESTOR) 20 MG tablet TAKE 1 TABLET BY MOUTH DAILY 90 tablet 3   vitamin B-12 (CYANOCOBALAMIN) 500 MCG tablet Take 500 mcg by mouth at bedtime.      VITAMIN D PO Take by mouth.     [DISCONTINUED] TAZTIA XT 180 MG 24 hr capsule TAKE ONE CAPSULE BY MOUTH EVERY DAY 90 capsule 0   No current facility-administered medications on file prior to visit.    Allergies  Allergen Reactions   Glyburide Other (See Comments)    Reaction:  Dizziness and confusion    Seroquel [Quetiapine Fumarate] Diarrhea, Nausea And Vomiting and Other (See Comments)    Reaction:  Confusion    Byetta 10 Mcg Pen [Exenatide] Nausea And Vomiting    Other reaction(s): Nausea And Vomiting    Past Medical History:  Diagnosis Date   Abnormal urine 10/02/2021   Acquired hypothyroidism 11/26/2009   Last Assessment & Plan: TSH low when last checked. Advised follow up labs and to follow up with Dr. Cruzita Lederer. The patient indicates understanding of these issues and agrees with the plan.   Atherosclerotic heart disease of native coronary artery without angina pectoris 11/26/2009   Last Assessment & Plan:  Quiet. On ASA, betablocker and statin.   Bipolar disorder, unspecified 11/26/2009   Last Assessment & Plan: Stable on current rx.   Borderline personality disorder    CAD (coronary artery disease)    multiple caths   Chronic pain syndrome 02/21/2010   Formatting of this note might be different from the original. Last Assessment & Plan: Followed by Dr. Primus Bravo. Yes, I feel she can stop ASA 5 days prior if necessary to procedure. Form will be faxed back to him.   DDD (degenerative disc disease), lumbar 09/28/2014   Enlarged liver    Enlarged pituitary gland    Essential hypertension 11/26/2009   Fibromyalgia    Generalized anxiety disorder with panic attacks 11/26/2009   GERD (gastroesophageal reflux  disease)    History of opioid abuse    Sober since 2016   History of suburethral sling procedure 12/13/2015   Hyperlipidemia due to type 2 diabetes mellitus 06/17/2017   Incomplete emptying of bladder 01/31/2016   Low back pain    Major depressive disorder    Mild cognitive impairment, multifactorial 05/20/2022   Multiple lacunar infarcts 11/03/2021   left corona radiata/internal capsule; posterior left frontal lobe.    Neck pain 08/14/2014   Nephrolithiasis    Osteoarthritis    Osteoporosis    Overactive detrusor 01/31/2016   Parkinsonism 2016   tremor; resolved after antipsychotics were discontinued; normal DaTscan   Personal history of colonic polyps 10/03/2011   2005 3 polyps - no records  2007 no polyps  10/03/2011-5 diminutive polyps, 4 recovered - adenomas      Proteinuria, unspecified 10/02/2021   Rheumatoid arthritis    Sacroiliac joint disease 09/24/2014   Scoliosis    Simple renal cyst 11/05/2021   Stage 3b chronic kidney disease 07/11/2020   SUI (stress urinary incontinence, female) 01/31/2016   Type 2 diabetes mellitus with diabetic polyneuropathy, without long-term current use of insulin 10/01/2011   Urinary incontinence 12/13/2015   Venous insufficiency 06/03/2018    Past Surgical History:  Procedure Laterality Date   BLADDER SURGERY     ?bladder sling    CARDIAC CATHETERIZATION     x3   CATARACT EXTRACTION, BILATERAL     COLONOSCOPY  multiple   2005 [place] records not available-polyps x 3   COLONOSCOPY WITH PROPOFOL N/A 10/12/2015   Procedure: COLONOSCOPY WITH PROPOFOL;  Surgeon: Lollie Sails, MD;  Location: [facility] ENDOSCOPY;  Service: Endoscopy;  Laterality: N/A;   COLONOSCOPY WITH PROPOFOL N/A 04/29/2021   Procedure: COLONOSCOPY WITH PROPOFOL;  Surgeon: [name], MD;  Location: [facility] ENDOSCOPY;  Service: Endoscopy;  Laterality: N/A;  DM   ESOPHAGEAL DILATION     kidney stone removal     TONSILLECTOMY     VAGINAL HYSTERECTOMY       Family History  Problem Relation Age of Onset   Hypertension Mother    Diabetes Mother    Bipolar disorder Father    Hypertension Father    Colon cancer Paternal Aunt    Colon cancer Paternal Grandmother    Thyroid disease Neg Hx    Breast cancer Neg Hx     Social History   Socioeconomic History   Marital status: Widowed    Spouse name: Not on file   Number of children: 3   Years of education: 12   Highest education level: High school graduate  Occupational History   Occupation: Disability    Comment: [employer]  Tobacco Use   Smoking status: Never  Passive exposure: Current   Smokeless tobacco: Never  Vaping Use   Vaping Use: Never used  Substance and Sexual Activity   Alcohol use: Yes    Comment: very rare   Drug use: Not Currently    Types: Cocaine, Marijuana    Comment: hx of opiate abuse until 2016. Prior use of cocaine and MJ over years   Sexual activity: Not on file  Other Topics Concern   Not on file  Social History Narrative   Daughter and granddaughter live with her      Has a living will- desires CPR but no prolonged life support if futile.   Daughter Museum/gallery conservator is health care POA   No tube feeds if cognitively unaware   Social Determinants of Health   Financial Resource Strain: Not on file  Food Insecurity: Not on file  Transportation Needs: Not on file  Physical Activity: Not on file  Stress: Not on file  Social Connections: Not on file  Intimate Partner Violence: Not on file   Review of Systems Eating okay No trouble sleeping---sometimes takes a while to initiate     Objective:   Physical Exam Constitutional:      Appearance: Normal appearance.  Cardiovascular:     Rate and Rhythm: Normal rate and regular rhythm.     Heart sounds: No murmur heard.    No gallop.  Pulmonary:     Effort: Pulmonary effort is normal.     Breath sounds: Normal breath sounds. No wheezing or rales.  Musculoskeletal:     Cervical  back: Neck supple.     Right lower leg: No edema.     Left lower leg: No edema.  Lymphadenopathy:     Cervical: No cervical adenopathy.  Neurological:     Mental Status: She is alert.            Assessment & Plan:

## 2022-06-06 NOTE — Assessment & Plan Note (Signed)
Certainly worrisome for angina--though ER evaluation negative for acute ischemia Will go ahead with cardiology referral for testing

## 2022-06-06 NOTE — Assessment & Plan Note (Signed)
GFR actually increased to 57 in ER

## 2022-06-06 NOTE — Assessment & Plan Note (Signed)
Wants to go to [facility]--will put in referral

## 2022-06-16 ENCOUNTER — Other Ambulatory Visit
Admission: RE | Admit: 2022-06-16 | Discharge: 2022-06-16 | Disposition: A | Discharge status: Home / Self Care | Payer: PPO | Attending: Cardiology | Admitting: Cardiology

## 2022-06-16 ENCOUNTER — Ambulatory Visit: Payer: PPO | Attending: Cardiology | Admitting: Cardiology

## 2022-06-16 ENCOUNTER — Encounter: Payer: Self-pay | Admitting: Cardiology

## 2022-06-16 VITALS — BP 170/84 | HR 52 | Ht 66.0 in | Wt 130.4 lb

## 2022-06-16 DIAGNOSIS — E7800 Pure hypercholesterolemia, unspecified: Secondary | ICD-10-CM | POA: Diagnosis not present

## 2022-06-16 DIAGNOSIS — I10 Essential (primary) hypertension: Secondary | ICD-10-CM | POA: Diagnosis not present

## 2022-06-16 DIAGNOSIS — R072 Precordial pain: Secondary | ICD-10-CM

## 2022-06-16 DIAGNOSIS — R079 Chest pain, unspecified: Secondary | ICD-10-CM | POA: Insufficient documentation

## 2022-06-16 LAB — BASIC METABOLIC PANEL
Anion gap: 8 (ref 5–15)
BUN: 29 mg/dL — ABNORMAL HIGH (ref 8–23)
CO2: 26 mmol/L (ref 22–32)
Calcium: 9.9 mg/dL (ref 8.9–10.3)
Chloride: 105 mmol/L (ref 98–111)
Creatinine, Ser: 1.07 mg/dL — ABNORMAL HIGH (ref 0.44–1.00)
GFR, Estimated: 56 mL/min — ABNORMAL LOW (ref >60)
Glucose, Bld: 147 mg/dL — ABNORMAL HIGH (ref 70–99)
Potassium: 4.5 mmol/L (ref 3.5–5.1)
Sodium: 139 mmol/L (ref 135–145)

## 2022-06-16 MED ORDER — METOPROLOL TARTRATE 25 MG PO TABS: 25.0000 mg | ORAL_TABLET | Freq: Once | ORAL | 0 refills | Status: DC

## 2022-06-16 NOTE — Progress Notes (Signed)
Cardiology Office Note:    Date:  06/16/2022   ID:  Lori Phillips, DOB 10-25-1955, MRN [id number]  PCP:  Venia Carbon, MD   [place] [facility] Cardiologist:  Kate Sable, MD     Referring MD: Hinda Kehr, MD   Chief Complaint  Patient presents with   New Patient (Initial Visit)    Chest Pain, Abnormal C, No Hx, no Family Hx   Lori Phillips is a 67 y.o. female who is being seen today for the evaluation of chest pain at the request of Hinda Kehr, MD.   History of Present Illness:    Lori Phillips is a 67 y.o. female with a hx of hypertension, diabetes, hyperlipidemia, anxiety who presents due to chest pain.  Was at home when she had an episode of chest pain causing her to go to the emergency room.  That was the first occurrence, has not had any episodes since.  She is not sure if that was secondary to anxiety.  Denies smoking, denies any family history of heart disease.  States blood pressure usually controlled when she follows up with PCP.  Past Medical History:  Diagnosis Date   Abnormal urine 10/02/2021   Acquired hypothyroidism 11/26/2009   Last Assessment & Plan: TSH low when last checked. Advised follow up labs and to follow up with Dr. Cruzita Lederer. The patient indicates understanding of these issues and agrees with the plan.   Atherosclerotic heart disease of native coronary artery without angina pectoris 11/26/2009   Last Assessment & Plan: Quiet. On ASA, betablocker and statin.   Bipolar disorder, unspecified 11/26/2009   Last Assessment & Plan: Stable on current rx.   Borderline personality disorder    CAD (coronary artery disease)    multiple caths   Chronic pain syndrome 02/21/2010   Formatting of this note might be different from the original. Last Assessment & Plan: Followed by Dr. Primus Bravo. Yes, I feel she can stop ASA 5 days prior if necessary to procedure. Form will be faxed back to him.   DDD (degenerative disc disease), lumbar  09/28/2014   Enlarged liver    Enlarged pituitary gland    Essential hypertension 11/26/2009   Fibromyalgia    Generalized anxiety disorder with panic attacks 11/26/2009   GERD (gastroesophageal reflux disease)    History of opioid abuse    Sober since 2016   History of suburethral sling procedure 12/13/2015   Hyperlipidemia due to type 2 diabetes mellitus 06/17/2017   Incomplete emptying of bladder 01/31/2016   Low back pain    Major depressive disorder    Mild cognitive impairment, multifactorial 05/20/2022   Multiple lacunar infarcts 11/03/2021   left corona radiata/internal capsule; posterior left frontal lobe.    Neck pain 08/14/2014   Nephrolithiasis    Osteoarthritis    Osteoporosis    Overactive detrusor 01/31/2016   Parkinsonism 2016   tremor; resolved after antipsychotics were discontinued; normal DaTscan   Personal history of colonic polyps 10/03/2011   2005 3 polyps - no records  2007 no polyps  10/03/2011-5 diminutive polyps, 4 recovered - adenomas      Proteinuria, unspecified 10/02/2021   Rheumatoid arthritis    Sacroiliac joint disease 09/24/2014   Scoliosis    Simple renal cyst 11/05/2021   Stage 3b chronic kidney disease 07/11/2020   SUI (stress urinary incontinence, female) 01/31/2016   Type 2 diabetes mellitus with diabetic polyneuropathy, without long-term current use of insulin 10/01/2011   Urinary incontinence  12/13/2015   Venous insufficiency 06/03/2018    Past Surgical History:  Procedure Laterality Date   BLADDER SURGERY     ?bladder sling    CARDIAC CATHETERIZATION     x3   CATARACT EXTRACTION, BILATERAL     COLONOSCOPY  multiple   2005 [place] not available-polyps x 3   COLONOSCOPY WITH PROPOFOL N/A 10/12/2015   Procedure: COLONOSCOPY WITH PROPOFOL;  Surgeon: Lollie Sails, MD;  Location: [facility] ENDOSCOPY;  Service: Endoscopy;  Laterality: N/A;   COLONOSCOPY WITH PROPOFOL N/A 04/29/2021   Procedure: COLONOSCOPY WITH PROPOFOL;   Surgeon: [name], MD;  Location: [facility] ENDOSCOPY;  Service: Endoscopy;  Laterality: N/A;  DM   ESOPHAGEAL DILATION     kidney stone removal     TONSILLECTOMY     VAGINAL HYSTERECTOMY      Current Medications: Current Meds  Medication Sig   acetaminophen (TYLENOL) 650 MG CR tablet Take 650 mg by mouth 3 (three) times daily as needed for pain.   buPROPion (WELLBUTRIN XL) 300 MG 24 hr tablet Take 1 tablet (300 mg total) by mouth daily.   diltiazem (CARDIZEM CD) 180 MG 24 hr capsule TAKE 1 CAPSULE(180 MG) BY MOUTH DAILY   divalproex (DEPAKOTE) 500 MG DR tablet TAKE 1 TABLET(500 MG) BY MOUTH TWICE DAILY   glipiZIDE (GLUCOTROL) 5 MG tablet TAKE 1 TABLET(5 MG) BY MOUTH DAILY BEFORE BREAKFAST   glucose blood (ONETOUCH VERIO) test strip Use to check blood sugar once a day. Dx Code E11.40   levothyroxine (SYNTHROID) 100 MCG tablet TAKE 1 TABLET BY MOUTH DAILY BEFORE BREAKFAST   lisinopril (ZESTRIL) 5 MG tablet TAKE 1 TABLET(5 MG) BY MOUTH DAILY   metFORMIN (GLUCOPHAGE) 500 MG tablet TAKE 1 TABLET(500 MG) BY MOUTH TWICE DAILY WITH A MEAL   metoprolol tartrate (LOPRESSOR) 25 MG tablet Take 1 tablet (25 mg total) by mouth once for 1 dose. TWO HOURS PRIOR TO CARDIAC CTA   OneTouch Delica Lancets [id number] MISC 1 each by Does not apply route daily. Use to obtain blood sample for blood sugar once a day. Dx Code E11.40   pantoprazole (PROTONIX) 40 MG tablet TAKE 1 TABLET(40 MG) BY MOUTH DAILY   rosuvastatin (CRESTOR) 20 MG tablet TAKE 1 TABLET BY MOUTH DAILY   vitamin B-12 (CYANOCOBALAMIN) 500 MCG tablet Take 500 mcg by mouth at bedtime.    VITAMIN D PO Take by mouth.     Allergies:   Glyburide, Seroquel [quetiapine fumarate], and Byetta 10 mcg pen [exenatide]   Social History   Socioeconomic History   Marital status: Widowed    Spouse name: Not on file   Number of children: 3   Years of education: 12   Highest education level: High school graduate  Occupational History   Occupation:  Disability    Comment: Research officer, trade union company  Tobacco Use   Smoking status: Never    Passive exposure: Current   Smokeless tobacco: Never  Vaping Use   Vaping Use: Never used  Substance and Sexual Activity   Alcohol use: Yes    Comment: very rare   Drug use: Not Currently    Types: Cocaine, Marijuana    Comment: hx of opiate abuse until 2016. Prior use of cocaine and MJ over years   Sexual activity: Not on file  Other Topics Concern   Not on file  Social History Narrative   Daughter and granddaughter live with her      Has a living will- desires CPR  but no prolonged life support if futile.   Daughter Museum/gallery conservator is health care POA   No tube feeds if cognitively unaware   Social Determinants of Health   Financial Resource Strain: Not on file  Food Insecurity: Not on file  Transportation Needs: Not on file  Physical Activity: Not on file  Stress: Not on file  Social Connections: Not on file     Family History: The patient's family history includes Bipolar disorder in her father; Colon cancer in her paternal aunt and paternal grandmother; Diabetes in her mother; Hypertension in her father and mother. There is no history of Thyroid disease or Breast cancer.  ROS:   Please see the history of present illness.     All other systems reviewed and are negative.  EKGs/Labs/Other Studies Reviewed:    The following studies were reviewed today:   EKG:  EKG is  ordered today.  The ekg ordered today demonstrates sinus bradycardia, heart rate 52, otherwise normal ECG  Recent Labs: 01/20/2022: TSH 4.41 06/02/2022: ALT 14; BUN 31; Creatinine, Ser 1.07; Hemoglobin 12.0; Platelets 206; Potassium 4.0; Sodium 139  Recent Lipid Panel    Component Value Date/Time   CHOL 142 01/20/2022 1548   TRIG 172.0 (H) 01/20/2022 1548   HDL 73.80 01/20/2022 1548   CHOLHDL 2 01/20/2022 1548   VLDL 34.4 01/20/2022 1548   LDLCALC 34 01/20/2022 1548   LDLDIRECT 59.0 07/18/2021 1521      Risk Assessment/Calculations:      HYPERTENSION CONTROL Vitals:   06/16/22 1026 06/16/22 1032  BP: (!) 170/80 (!) 170/84    The patient's blood pressure is elevated above target today.  In order to address the patient's elevated BP: Blood pressure will be monitored at home to determine if medication changes need to be made.            Physical Exam:    VS:  BP (!) 170/84 (BP Location: Right Arm)   Pulse (!) 52   Ht '5\' 6"'$  (1.676 m)   Wt 130 lb 6.4 oz (59.1 kg)   SpO2 100%   BMI 21.05 kg/m     Wt Readings from Last 3 Encounters:  06/16/22 130 lb 6.4 oz (59.1 kg)  06/06/22 126 lb (57.2 kg)  06/02/22 130 lb (59 kg)     GEN:  Well nourished, well developed in no acute distress HEENT: Normal NECK: No JVD; No carotid bruits CARDIAC: RRR, no murmurs, rubs, gallops RESPIRATORY:  Clear to auscultation without rales, wheezing or rhonchi  ABDOMEN: Soft, non-tender, non-distended MUSCULOSKELETAL:  No edema; No deformity  SKIN: Warm and dry NEUROLOGIC:  Alert and oriented x 3 PSYCHIATRIC:  Normal affect   ASSESSMENT:    1. Precordial pain   2. Primary hypertension   3. Pure hypercholesterolemia   4. Chest pain, unspecified type    PLAN:    In order of problems listed above:  Chest pain, has several risk factors, get echo, get coronary CTA. Hypertension, BP elevated.  Continue Cardizem, lisinopril.  Titrate lisinopril at follow-up visit if BP stays elevated. Hyperlipidemia, cholesterol controlled.  Continue Crestor.  Follow-up after cardiac testing.     Medication Adjustments/Labs and Tests Ordered: Current medicines are reviewed at length with the patient today.  Concerns regarding medicines are outlined above.  Orders Placed This Encounter  Procedures   CT CORONARY MORPH W/CTA COR W/SCORE W/CA W/CM &/OR WO/CM   Basic Metabolic Panel (BMET)   ECHOCARDIOGRAM COMPLETE   Meds ordered this encounter  Medications  metoprolol tartrate (LOPRESSOR) 25 MG  tablet    Sig: Take 1 tablet (25 mg total) by mouth once for 1 dose. TWO HOURS PRIOR TO CARDIAC CTA    Dispense:  1 tablet    Refill:  0    Patient Instructions  Medication Instructions:   Your physician recommends that you continue on your current medications as directed. Please refer to the Current Medication list given to you today.  *If you need a refill on your cardiac medications before your next appointment, please call your pharmacy*   Lab Work:  Your physician recommends you go to the medical mall for labs.   If you have labs (blood work) drawn today and your tests are completely normal, you will receive your results only by: [place] (if you have MyChart) OR A paper copy in the mail If you have any lab test that is abnormal or we need to change your treatment, we will call you to review the results.   Testing/Procedures:  Your physician has requested that you have an echocardiogram. Echocardiography is a painless test that uses sound waves to create images of your heart. It provides your doctor with information about the size and shape of your heart and how well your heart's chambers and valves are working. This procedure takes approximately one hour. There are no restrictions for this procedure. Please do NOT wear cologne, perfume, aftershave, or lotions (deodorant is allowed). Please arrive 15 minutes prior to your appointment time.    Your cardiac CT will be scheduled at one of the below locations:   [facility] [address] [place], [place] [address] [phone]  [facility] [place],  [address] [phone]  If scheduled at [facility] or [facility], please arrive 15 mins early for check-in and test prep.   Please follow these instructions carefully (unless otherwise directed):   On the Night Before the  Test: Be sure to Drink plenty of water. Do not consume any caffeinated/decaffeinated beverages or chocolate 12 hours prior to your test. Do not take any antihistamines 12 hours prior to your test.   On the Day of the Test: Drink plenty of water until 1 hour prior to the test. Do not eat any food 1 hour prior to test. HOLD Glipizide/Metformin the morning of and 48 hours after the test You may take your regular medications prior to the test.  Take metoprolol (Lopressor) two hours prior to test. FEMALES- please wear underwire-free bra if available, avoid dresses & tight clothing       After the Test: Drink plenty of water. After receiving IV contrast, you may experience a mild flushed feeling. This is normal. On occasion, you may experience a mild rash up to 24 hours after the test. This is not dangerous. If this occurs, you can take Benadryl 25 mg and increase your fluid intake. If you experience trouble breathing, this can be serious. If it is severe call 911 IMMEDIATELY. If it is mild, please call our office. If you take any of these medications: Glipizide/Metformin, Avandament, Glucavance, please do not take 48 hours after completing test unless otherwise instructed.    Follow-Up: At [facility], you and your health needs are our priority.  As part of our continuing mission to provide you with exceptional heart care, we have created designated Provider Care Teams.  These Care Teams include your primary Cardiologist (physician)  and Advanced Practice Providers (APPs -  Physician Assistants and Nurse Practitioners) who all work together to provide you with the care you need, when you need it.  We recommend signing up for the patient portal called "MyChart".  Sign up information is provided on this After Visit Summary.  MyChart is used to connect with patients for Virtual Visits (Telemedicine).  Patients are able to view lab/test results, encounter notes, upcoming appointments, etc.   Non-urgent messages can be sent to your provider as well.   To learn more about what you can do with MyChart, go to [email or web address].    Your next appointment:    After Echocardiogram and Cardiac CTA  Provider:   You may see Kate Sable, MD or one of the following Advanced Practice Providers on your designated Care Team:   Murray Hodgkins, NP Christell Faith, PA-C Cadence Kathlen Mody, PA-C Gerrie Nordmann, NP   Signed, Kate Sable, MD  06/16/2022 11:24 AM    [place]

## 2022-06-16 NOTE — Patient Instructions (Addendum)
Medication Instructions:   Your physician recommends that you continue on your current medications as directed. Please refer to the Current Medication list given to you today.  *If you need a refill on your cardiac medications before your next appointment, please call your pharmacy*   Lab Work:  Your physician recommends you go to the medical mall for labs.   If you have labs (blood work) drawn today and your tests are completely normal, you will receive your results only by: [place] (if you have MyChart) OR A paper copy in the mail If you have any lab test that is abnormal or we need to change your treatment, we will call you to review the results.   Testing/Procedures:  Your physician has requested that you have an echocardiogram. Echocardiography is a painless test that uses sound waves to create images of your heart. It provides your doctor with information about the size and shape of your heart and how well your heart's chambers and valves are working. This procedure takes approximately one hour. There are no restrictions for this procedure. Please do NOT wear cologne, perfume, aftershave, or lotions (deodorant is allowed). Please arrive 15 minutes prior to your appointment time.    Your cardiac CT will be scheduled at one of the below locations:   [facility] [facility] [address] [place], [place] [address] [phone]  [facility] [place], [place] [address] [phone]  If scheduled at [facility] or [facility], please arrive 15 mins early for check-in and test prep.   Please follow these instructions carefully (unless otherwise directed):   On the Night Before the Test: Be sure to Drink plenty of water. Do not consume any caffeinated/decaffeinated beverages or chocolate 12 hours prior to your test. Do not take any antihistamines 12  hours prior to your test.   On the Day of the Test: Drink plenty of water until 1 hour prior to the test. Do not eat any food 1 hour prior to test. HOLD Glipizide/Metformin the morning of and 48 hours after the test You may take your regular medications prior to the test.  Take metoprolol (Lopressor) two hours prior to test. FEMALES- please wear underwire-free bra if available, avoid dresses & tight clothing       After the Test: Drink plenty of water. After receiving IV contrast, you may experience a mild flushed feeling. This is normal. On occasion, you may experience a mild rash up to 24 hours after the test. This is not dangerous. If this occurs, you can take Benadryl 25 mg and increase your fluid intake. If you experience trouble breathing, this can be serious. If it is severe call 911 IMMEDIATELY. If it is mild, please call our office. If you take any of these medications: Glipizide/Metformin, Avandament, Glucavance, please do not take 48 hours after completing test unless otherwise instructed.    Follow-Up: At [facility], you and your health needs are our priority.  As part of our continuing mission to provide you with exceptional heart care, we have created designated Provider Care Teams.  These Care Teams include your primary Cardiologist (physician) and Advanced Practice Providers (APPs -  Physician Assistants and Nurse Practitioners) who all work together to provide you with the care you need, when you need it.  We recommend signing up for the patient portal called "MyChart".  Sign up information is provided on this After  Visit Summary.  MyChart is used to connect with patients for Virtual Visits (Telemedicine).  Patients are able to view lab/test results, encounter notes, upcoming appointments, etc.  Non-urgent messages can be sent to your provider as well.   To learn more about what you can do with MyChart, go to [email or web address].    Your next appointment:     After Echocardiogram and Cardiac CTA  Provider:   You may see Kate Sable, MD or one of the following Advanced Practice Providers on your designated Care Team:   Murray Hodgkins, NP Christell Faith, PA-C Cadence Kathlen Mody, PA-C Gerrie Nordmann, NP

## 2022-06-17 NOTE — Addendum Note (Signed)
Addended by: Nestor Ramp on: 06/17/2022 04:27 PM   Modules accepted: Orders

## 2022-06-18 ENCOUNTER — Ambulatory Visit: Payer: PPO | Admitting: Behavioral Health

## 2022-06-30 ENCOUNTER — Other Ambulatory Visit: Payer: Self-pay

## 2022-06-30 MED ORDER — METOPROLOL TARTRATE 25 MG PO TABS: 25.0000 mg | ORAL_TABLET | Freq: Once | ORAL | 0 refills | Status: DC

## 2022-07-08 ENCOUNTER — Telehealth (HOSPITAL_COMMUNITY): Payer: Self-pay | Admitting: Emergency Medicine

## 2022-07-08 NOTE — Telephone Encounter (Signed)
Attempted to call patient regarding upcoming cardiac CT appointment. °Left message on voicemail with name and callback number °Mallie Linnemann RN Navigator Cardiac Imaging °[facility] °[phone] Office °[phone] Cell ° °

## 2022-07-10 ENCOUNTER — Ambulatory Visit: Admission: RE | Admit: 2022-07-10 | Payer: PPO | Source: Ambulatory Visit

## 2022-07-29 ENCOUNTER — Ambulatory Visit: Payer: PPO

## 2022-08-05 ENCOUNTER — Telehealth (HOSPITAL_COMMUNITY): Payer: Self-pay | Admitting: Emergency Medicine

## 2022-08-05 ENCOUNTER — Ambulatory Visit: Payer: PPO | Admitting: Internal Medicine

## 2022-08-05 NOTE — Telephone Encounter (Signed)
Attempted to call patient regarding upcoming cardiac CT appointment. °Left message on voicemail with name and callback number °Neema Barreira RN Navigator Cardiac Imaging °[facility] °[phone] Office °[phone] Cell ° °

## 2022-08-07 ENCOUNTER — Ambulatory Visit
Admission: RE | Admit: 2022-08-07 | Discharge: 2022-08-07 | Disposition: A | Discharge status: Home / Self Care | Payer: PPO | Source: Ambulatory Visit | Attending: Cardiology | Admitting: Cardiology

## 2022-08-07 DIAGNOSIS — I2510 Atherosclerotic heart disease of native coronary artery without angina pectoris: Secondary | ICD-10-CM | POA: Insufficient documentation

## 2022-08-07 DIAGNOSIS — R072 Precordial pain: Secondary | ICD-10-CM

## 2022-08-07 DIAGNOSIS — R079 Chest pain, unspecified: Secondary | ICD-10-CM | POA: Insufficient documentation

## 2022-08-07 LAB — POCT I-STAT CREATININE: Creatinine, Ser: 1.4 mg/dL — ABNORMAL HIGH (ref 0.44–1.00)

## 2022-08-07 MED ORDER — NITROGLYCERIN 0.4 MG SL SUBL
0.8000 mg | SUBLINGUAL_TABLET | Freq: Once | SUBLINGUAL | Status: AC
  Administered 2022-08-07: 0.8 mg via SUBLINGUAL

## 2022-08-07 MED ORDER — IOHEXOL 350 MG/ML SOLN
75.0000 mL | Freq: Once | INTRAVENOUS | Status: AC | PRN
  Administered 2022-08-07: 75 mL via INTRAVENOUS

## 2022-08-07 NOTE — Progress Notes (Signed)
Patient tolerated procedure well. Ambulate w/o difficulty. Denies light headedness or being dizzy. Sitting in chair drinking water provided. Encouraged to drink extra water today and reasoning explained. Verbalized understanding. All questions answered. ABC intact. No further needs. Discharge from procedure area w/o issues.   °

## 2022-08-08 ENCOUNTER — Ambulatory Visit: Payer: Medicaid Other | Admitting: Cardiology

## 2022-08-13 ENCOUNTER — Telehealth: Payer: Self-pay | Admitting: Internal Medicine

## 2022-08-13 NOTE — Telephone Encounter (Signed)
Spoke to pt. Advised her that her B12 level has not been checked in 2 years and it was high then. She will have to wait and discuss it with Dr Alphonsus Sias at the visit.

## 2022-08-13 NOTE — Telephone Encounter (Signed)
Patient called in inquiring about receiving a b12 shot when she come in for her appointment on 08/25/2022. She said that her daughter wanted her to ask about receiving one.

## 2022-08-18 ENCOUNTER — Other Ambulatory Visit: Payer: Self-pay | Admitting: Internal Medicine

## 2022-08-18 ENCOUNTER — Ambulatory Visit: Payer: PPO | Admitting: Internal Medicine

## 2022-08-18 DIAGNOSIS — I10 Essential (primary) hypertension: Secondary | ICD-10-CM

## 2022-08-20 HISTORY — PX: OTHER SURGICAL HISTORY: SHX169

## 2022-08-22 ENCOUNTER — Ambulatory Visit: Payer: Medicaid Other | Admitting: Cardiology

## 2022-08-25 ENCOUNTER — Telehealth: Payer: Self-pay

## 2022-08-25 ENCOUNTER — Encounter: Payer: Self-pay | Admitting: Internal Medicine

## 2022-08-25 ENCOUNTER — Ambulatory Visit (INDEPENDENT_AMBULATORY_CARE_PROVIDER_SITE_OTHER): Payer: PPO | Admitting: Internal Medicine

## 2022-08-25 VITALS — BP 122/80 | HR 56 | Temp 97.2°F | Ht 64.5 in | Wt 123.0 lb

## 2022-08-25 DIAGNOSIS — Z7984 Long term (current) use of oral hypoglycemic drugs: Secondary | ICD-10-CM

## 2022-08-25 DIAGNOSIS — E1140 Type 2 diabetes mellitus with diabetic neuropathy, unspecified: Secondary | ICD-10-CM

## 2022-08-25 DIAGNOSIS — E1142 Type 2 diabetes mellitus with diabetic polyneuropathy: Secondary | ICD-10-CM

## 2022-08-25 DIAGNOSIS — R0789 Other chest pain: Secondary | ICD-10-CM

## 2022-08-25 DIAGNOSIS — F3341 Major depressive disorder, recurrent, in partial remission: Secondary | ICD-10-CM | POA: Diagnosis not present

## 2022-08-25 LAB — POCT GLYCOSYLATED HEMOGLOBIN (HGB A1C): Hemoglobin A1C: 7.2 % — AB (ref 4.0–5.6)

## 2022-08-25 NOTE — Patient Outreach (Signed)
  Care Coordination   08/25/2022 Name: Lori Phillips MRN: [id number] DOB: 07/26/1955   Care Coordination Outreach Attempts:  An unsuccessful telephone outreach was attempted today to offer the patient information about available care coordination services. HIPAA compliant voice message left.   Follow Up Plan:  Additional outreach attempts will be made to offer the patient care coordination information and services.   Encounter Outcome:  No Answer   Care Coordination Interventions:  No, not indicated    [facility] Care Coordination [phone] direct line

## 2022-08-25 NOTE — Progress Notes (Signed)
Subjective:    Patient ID: Lori Phillips, female    DOB: Apr 24, 1955, 67 y.o.   MRN: [id number]  HPI Here for follow up of multiple medical issues  Did see the cardiologist Had CT calcium score--no major blockages Now has echo this week and going back to Dr Azucena Cecil next week No recurrence of chest pain  Not checking sugars Does take the meds Fairly careful with her eating No problems with foot pain or burning  Has seen the kidney specialist Is on low dose lisinopril  Mood ---still depressed fairly consistently Trouble daughter Ezequiel Essex---- and stress with granddaughter AMber as well (they live with her) No mania Has thought about dying--but not considering suicide  Current Outpatient Medications on File Prior to Visit  Medication Sig Dispense Refill   acetaminophen (TYLENOL) 650 MG CR tablet Take 650 mg by mouth 3 (three) times daily as needed for pain.     buPROPion (WELLBUTRIN XL) 300 MG 24 hr tablet TAKE 1 TABLET(300 MG) BY MOUTH DAILY 90 tablet 3   diltiazem (CARDIZEM CD) 180 MG 24 hr capsule TAKE 1 CAPSULE(180 MG) BY MOUTH DAILY 90 capsule 3   divalproex (DEPAKOTE) 500 MG DR tablet TAKE 1 TABLET(500 MG) BY MOUTH TWICE DAILY 180 tablet 3   glipiZIDE (GLUCOTROL) 5 MG tablet TAKE 1 TABLET(5 MG) BY MOUTH DAILY BEFORE BREAKFAST 90 tablet 3   glucose blood (ONETOUCH VERIO) test strip Use to check blood sugar once a day. Dx Code E11.40 100 each 4   levothyroxine (SYNTHROID) 100 MCG tablet TAKE 1 TABLET BY MOUTH DAILY BEFORE BREAKFAST 90 tablet 3   lisinopril (ZESTRIL) 5 MG tablet TAKE 1 TABLET(5 MG) BY MOUTH DAILY 90 tablet 3   metFORMIN (GLUCOPHAGE) 500 MG tablet TAKE 1 TABLET(500 MG) BY MOUTH TWICE DAILY WITH A MEAL 180 tablet 3   OneTouch Delica Lancets 33G MISC 1 each by Does not apply route daily. Use to obtain blood sample for blood sugar once a day. Dx Code E11.40 100 each 3   pantoprazole (PROTONIX) 40 MG tablet TAKE 1 TABLET(40 MG) BY MOUTH DAILY 90 tablet 3    rosuvastatin (CRESTOR) 20 MG tablet TAKE 1 TABLET BY MOUTH DAILY 90 tablet 3   vitamin B-12 (CYANOCOBALAMIN) 500 MCG tablet Take 500 mcg by mouth at bedtime.      VITAMIN D PO Take by mouth.     metoprolol tartrate (LOPRESSOR) 25 MG tablet Take 1 tablet (25 mg total) by mouth once for 1 dose. TWO HOURS PRIOR TO CARDIAC CTA 1 tablet 0   [DISCONTINUED] TAZTIA XT 180 MG 24 hr capsule TAKE ONE CAPSULE BY MOUTH EVERY DAY 90 capsule 0   No current facility-administered medications on file prior to visit.    Allergies  Allergen Reactions   Glyburide Other (See Comments)    Reaction:  Dizziness and confusion    Seroquel [Quetiapine Fumarate] Diarrhea, Nausea And Vomiting and Other (See Comments)    Reaction:  Confusion    Byetta 10 Mcg Pen [Exenatide] Nausea And Vomiting    Other reaction(s): Nausea And Vomiting    Past Medical History:  Diagnosis Date   Abnormal urine 10/02/2021   Acquired hypothyroidism 11/26/2009   Last Assessment & Plan: TSH low when last checked. Advised follow up labs and to follow up with Dr. Elvera Lennox. The patient indicates understanding of these issues and agrees with the plan.   Atherosclerotic heart disease of native coronary artery without angina pectoris 11/26/2009   Last Assessment & Plan:  Quiet. On ASA, betablocker and statin.   Bipolar disorder, unspecified 11/26/2009   Last Assessment & Plan: Stable on current rx.   Borderline personality disorder    CAD (coronary artery disease)    multiple caths   Chronic pain syndrome 02/21/2010   Formatting of this note might be different from the original. Last Assessment & Plan: Followed by Dr. Metta Clines. Yes, I feel she can stop ASA 5 days prior if necessary to procedure. Form will be faxed back to him.   DDD (degenerative disc disease), lumbar 09/28/2014   Enlarged liver    Enlarged pituitary gland    Essential hypertension 11/26/2009   Fibromyalgia    Generalized anxiety disorder with panic attacks 11/26/2009   GERD  (gastroesophageal reflux disease)    History of opioid abuse    Sober since 2016   History of suburethral sling procedure 12/13/2015   Hyperlipidemia due to type 2 diabetes mellitus 06/17/2017   Incomplete emptying of bladder 01/31/2016   Low back pain    Major depressive disorder    Mild cognitive impairment, multifactorial 05/20/2022   Multiple lacunar infarcts 11/03/2021   left corona radiata/internal capsule; posterior left frontal lobe.    Neck pain 08/14/2014   Nephrolithiasis    Osteoarthritis    Osteoporosis    Overactive detrusor 01/31/2016   Parkinsonism 2016   tremor; resolved after antipsychotics were discontinued; normal DaTscan   Personal history of colonic polyps 10/03/2011   2005 3 polyps - no records  2007 no polyps  10/03/2011-5 diminutive polyps, 4 recovered - adenomas      Proteinuria, unspecified 10/02/2021   Rheumatoid arthritis    Sacroiliac joint disease 09/24/2014   Scoliosis    Simple renal cyst 11/05/2021   Stage 3b chronic kidney disease 07/11/2020   SUI (stress urinary incontinence, female) 01/31/2016   Type 2 diabetes mellitus with diabetic polyneuropathy, without long-term current use of insulin 10/01/2011   Urinary incontinence 12/13/2015   Venous insufficiency 06/03/2018    Past Surgical History:  Procedure Laterality Date   BLADDER SURGERY     ?bladder sling    CARDIAC CATHETERIZATION     x3   CATARACT EXTRACTION, BILATERAL     COLONOSCOPY  multiple   2005 [place] records not available-polyps x 3   COLONOSCOPY WITH PROPOFOL N/A 10/12/2015   Procedure: COLONOSCOPY WITH PROPOFOL;  Surgeon: Christena Deem, MD;  Location: [facility] ENDOSCOPY;  Service: Endoscopy;  Laterality: N/A;   COLONOSCOPY WITH PROPOFOL N/A 04/29/2021   Procedure: COLONOSCOPY WITH PROPOFOL;  Surgeon: Regis Bill, MD;  Location: [facility] ENDOSCOPY;  Service: Endoscopy;  Laterality: N/A;  DM   ESOPHAGEAL DILATION     kidney stone removal     TONSILLECTOMY     VAGINAL  HYSTERECTOMY      Family History  Problem Relation Age of Onset   Hypertension Mother    Diabetes Mother    Bipolar disorder Father    Hypertension Father    Colon cancer Paternal Aunt    Colon cancer Paternal Grandmother    Thyroid disease Neg Hx    Breast cancer Neg Hx     Social History   Socioeconomic History   Marital status: Widowed    Spouse name: Not on file   Number of children: 3   Years of education: 12   Highest education level: High school graduate  Occupational History   Occupation: Disability    Comment: Personnel officer [employer]  Tobacco Use   Smoking status: Never  Passive exposure: Current   Smokeless tobacco: Never  Vaping Use   Vaping Use: Never used  Substance and Sexual Activity   Alcohol use: Yes    Comment: very rare   Drug use: Not Currently    Types: Cocaine, Marijuana    Comment: hx of opiate abuse until 2016. Prior use of cocaine and MJ over years   Sexual activity: Not on file  Other Topics Concern   Not on file  Social History Narrative   Daughter and granddaughter live with her      Has a living will- desires CPR but no prolonged life support if futile.   Daughter Hospital doctor is health care POA   No tube feeds if cognitively unaware   Social Determinants of Health   Financial Resource Strain: Not on file  Food Insecurity: Not on file  Transportation Needs: Not on file  Physical Activity: Not on file  Stress: Not on file  Social Connections: Not on file  Intimate Partner Violence: Not on file   Review of Systems No heartburn with protonix Sleeps okay    Objective:   Physical Exam Constitutional:      Appearance: Normal appearance.  Cardiovascular:     Rate and Rhythm: Normal rate and regular rhythm.     Pulses: Normal pulses.     Heart sounds: No murmur heard.    No gallop.  Pulmonary:     Effort: Pulmonary effort is normal.     Breath sounds: Normal breath sounds. No wheezing or rales.  Skin:    Comments:  No foot lesions  Neurological:     Mental Status: She is alert.     Comments: Fairly normal sensation in feet            Assessment & Plan:

## 2022-08-25 NOTE — Assessment & Plan Note (Signed)
Lab Results  Component Value Date   HGBA1C 7.2 (A) 08/25/2022   Control is still good--eating healthy Continue metformin 500 bid and glipizide 5mg  daily

## 2022-08-25 NOTE — Assessment & Plan Note (Signed)
Better Coronary calcium CT not worrisome Having echo now

## 2022-08-25 NOTE — Assessment & Plan Note (Signed)
No recent mania Chronic but not severe now On bupropion 300mg  daily, depakote 500 bid

## 2022-08-28 ENCOUNTER — Ambulatory Visit: Payer: PPO | Attending: Cardiology

## 2022-08-28 DIAGNOSIS — R079 Chest pain, unspecified: Secondary | ICD-10-CM

## 2022-08-28 LAB — ECHOCARDIOGRAM COMPLETE
AR max vel: 2.5 cm2
AV Area VTI: 3.16 cm2
AV Area mean vel: 2.9 cm2
AV Mean grad: 2 mmHg
AV Peak grad: 3.8 mmHg
Ao pk vel: 0.98 m/s
Area-P 1/2: 2.76 cm2
S' Lateral: 2.7 cm

## 2022-09-02 ENCOUNTER — Encounter: Payer: Self-pay | Admitting: *Deleted

## 2022-09-02 ENCOUNTER — Emergency Department: Payer: PPO

## 2022-09-02 ENCOUNTER — Other Ambulatory Visit: Payer: Self-pay

## 2022-09-02 ENCOUNTER — Emergency Department
Admission: EM | Admit: 2022-09-02 | Discharge: 2022-09-02 | Discharge status: Against Medical Advice | Payer: PPO | Attending: Emergency Medicine | Admitting: Emergency Medicine

## 2022-09-02 DIAGNOSIS — W010XXA Fall on same level from slipping, tripping and stumbling without subsequent striking against object, initial encounter: Secondary | ICD-10-CM | POA: Diagnosis not present

## 2022-09-02 DIAGNOSIS — Y92007 Garden or yard of unspecified non-institutional (private) residence as the place of occurrence of the external cause: Secondary | ICD-10-CM | POA: Diagnosis not present

## 2022-09-02 DIAGNOSIS — I10 Essential (primary) hypertension: Secondary | ICD-10-CM | POA: Diagnosis not present

## 2022-09-02 DIAGNOSIS — E039 Hypothyroidism, unspecified: Secondary | ICD-10-CM | POA: Diagnosis not present

## 2022-09-02 DIAGNOSIS — M25511 Pain in right shoulder: Secondary | ICD-10-CM | POA: Diagnosis not present

## 2022-09-02 DIAGNOSIS — M1990 Unspecified osteoarthritis, unspecified site: Secondary | ICD-10-CM | POA: Diagnosis not present

## 2022-09-02 DIAGNOSIS — Z5321 Procedure and treatment not carried out due to patient leaving prior to being seen by health care provider: Secondary | ICD-10-CM | POA: Insufficient documentation

## 2022-09-02 DIAGNOSIS — Y93H2 Activity, gardening and landscaping: Secondary | ICD-10-CM | POA: Insufficient documentation

## 2022-09-02 DIAGNOSIS — K219 Gastro-esophageal reflux disease without esophagitis: Secondary | ICD-10-CM | POA: Diagnosis not present

## 2022-09-02 DIAGNOSIS — E119 Type 2 diabetes mellitus without complications: Secondary | ICD-10-CM | POA: Diagnosis not present

## 2022-09-02 DIAGNOSIS — S42031A Displaced fracture of lateral end of right clavicle, initial encounter for closed fracture: Secondary | ICD-10-CM | POA: Diagnosis not present

## 2022-09-02 DIAGNOSIS — Z7984 Long term (current) use of oral hypoglycemic drugs: Secondary | ICD-10-CM | POA: Diagnosis not present

## 2022-09-02 DIAGNOSIS — M0680 Other specified rheumatoid arthritis, unspecified site: Secondary | ICD-10-CM | POA: Diagnosis not present

## 2022-09-02 DIAGNOSIS — Z888 Allergy status to other drugs, medicaments and biological substances status: Secondary | ICD-10-CM | POA: Diagnosis not present

## 2022-09-02 DIAGNOSIS — Z79899 Other long term (current) drug therapy: Secondary | ICD-10-CM | POA: Diagnosis not present

## 2022-09-02 DIAGNOSIS — Z79890 Hormone replacement therapy (postmenopausal): Secondary | ICD-10-CM | POA: Diagnosis not present

## 2022-09-02 DIAGNOSIS — E785 Hyperlipidemia, unspecified: Secondary | ICD-10-CM | POA: Diagnosis not present

## 2022-09-02 NOTE — ED Triage Notes (Signed)
Pt brought in by family in a wheelchair.  Pt fell today outside while cutting bushes.  Pt slipped and fell in the yard on a hill  pt has right shoulder pain.   Limited rom   pt alert.

## 2022-09-03 ENCOUNTER — Ambulatory Visit: Payer: PPO | Admitting: Cardiology

## 2022-09-03 DIAGNOSIS — E1122 Type 2 diabetes mellitus with diabetic chronic kidney disease: Secondary | ICD-10-CM | POA: Diagnosis not present

## 2022-09-03 DIAGNOSIS — M79601 Pain in right arm: Secondary | ICD-10-CM | POA: Diagnosis not present

## 2022-09-03 DIAGNOSIS — S42031A Displaced fracture of lateral end of right clavicle, initial encounter for closed fracture: Secondary | ICD-10-CM | POA: Diagnosis not present

## 2022-09-03 DIAGNOSIS — Z79890 Hormone replacement therapy (postmenopausal): Secondary | ICD-10-CM | POA: Diagnosis not present

## 2022-09-03 DIAGNOSIS — Z7984 Long term (current) use of oral hypoglycemic drugs: Secondary | ICD-10-CM | POA: Diagnosis not present

## 2022-09-03 DIAGNOSIS — F0390 Unspecified dementia without behavioral disturbance: Secondary | ICD-10-CM | POA: Diagnosis not present

## 2022-09-03 DIAGNOSIS — I129 Hypertensive chronic kidney disease with stage 1 through stage 4 chronic kidney disease, or unspecified chronic kidney disease: Secondary | ICD-10-CM | POA: Diagnosis not present

## 2022-09-03 DIAGNOSIS — M069 Rheumatoid arthritis, unspecified: Secondary | ICD-10-CM | POA: Diagnosis not present

## 2022-09-03 DIAGNOSIS — Z79899 Other long term (current) drug therapy: Secondary | ICD-10-CM | POA: Diagnosis not present

## 2022-09-03 DIAGNOSIS — E785 Hyperlipidemia, unspecified: Secondary | ICD-10-CM | POA: Diagnosis not present

## 2022-09-03 DIAGNOSIS — E039 Hypothyroidism, unspecified: Secondary | ICD-10-CM | POA: Diagnosis not present

## 2022-09-03 DIAGNOSIS — S42031D Displaced fracture of lateral end of right clavicle, subsequent encounter for fracture with routine healing: Secondary | ICD-10-CM | POA: Diagnosis not present

## 2022-09-03 DIAGNOSIS — N183 Chronic kidney disease, stage 3 unspecified: Secondary | ICD-10-CM | POA: Diagnosis not present

## 2022-09-03 DIAGNOSIS — G4730 Sleep apnea, unspecified: Secondary | ICD-10-CM | POA: Diagnosis not present

## 2022-09-04 DIAGNOSIS — S42031A Displaced fracture of lateral end of right clavicle, initial encounter for closed fracture: Secondary | ICD-10-CM | POA: Diagnosis not present

## 2022-09-10 DIAGNOSIS — E039 Hypothyroidism, unspecified: Secondary | ICD-10-CM | POA: Diagnosis not present

## 2022-09-10 DIAGNOSIS — S42031A Displaced fracture of lateral end of right clavicle, initial encounter for closed fracture: Secondary | ICD-10-CM | POA: Diagnosis not present

## 2022-09-10 DIAGNOSIS — Z8673 Personal history of transient ischemic attack (TIA), and cerebral infarction without residual deficits: Secondary | ICD-10-CM | POA: Diagnosis not present

## 2022-09-10 DIAGNOSIS — E1122 Type 2 diabetes mellitus with diabetic chronic kidney disease: Secondary | ICD-10-CM | POA: Diagnosis not present

## 2022-09-10 DIAGNOSIS — S43421A Sprain of right rotator cuff capsule, initial encounter: Secondary | ICD-10-CM | POA: Diagnosis not present

## 2022-09-10 DIAGNOSIS — E785 Hyperlipidemia, unspecified: Secondary | ICD-10-CM | POA: Diagnosis not present

## 2022-09-10 DIAGNOSIS — G8918 Other acute postprocedural pain: Secondary | ICD-10-CM | POA: Diagnosis not present

## 2022-09-10 DIAGNOSIS — F319 Bipolar disorder, unspecified: Secondary | ICD-10-CM | POA: Diagnosis not present

## 2022-09-10 DIAGNOSIS — I129 Hypertensive chronic kidney disease with stage 1 through stage 4 chronic kidney disease, or unspecified chronic kidney disease: Secondary | ICD-10-CM | POA: Diagnosis not present

## 2022-09-10 DIAGNOSIS — S46219A Strain of muscle, fascia and tendon of other parts of biceps, unspecified arm, initial encounter: Secondary | ICD-10-CM | POA: Diagnosis not present

## 2022-09-10 DIAGNOSIS — K219 Gastro-esophageal reflux disease without esophagitis: Secondary | ICD-10-CM | POA: Diagnosis not present

## 2022-09-10 DIAGNOSIS — I2510 Atherosclerotic heart disease of native coronary artery without angina pectoris: Secondary | ICD-10-CM | POA: Diagnosis not present

## 2022-09-10 DIAGNOSIS — Z7984 Long term (current) use of oral hypoglycemic drugs: Secondary | ICD-10-CM | POA: Diagnosis not present

## 2022-09-10 DIAGNOSIS — N189 Chronic kidney disease, unspecified: Secondary | ICD-10-CM | POA: Diagnosis not present

## 2022-09-10 DIAGNOSIS — Z79899 Other long term (current) drug therapy: Secondary | ICD-10-CM | POA: Diagnosis not present

## 2022-09-10 DIAGNOSIS — G8929 Other chronic pain: Secondary | ICD-10-CM | POA: Diagnosis not present

## 2022-09-10 DIAGNOSIS — S42001A Fracture of unspecified part of right clavicle, initial encounter for closed fracture: Secondary | ICD-10-CM | POA: Diagnosis not present

## 2022-09-19 ENCOUNTER — Other Ambulatory Visit: Payer: Self-pay | Admitting: Internal Medicine

## 2022-09-25 DIAGNOSIS — S42031D Displaced fracture of lateral end of right clavicle, subsequent encounter for fracture with routine healing: Secondary | ICD-10-CM | POA: Diagnosis not present

## 2022-09-25 DIAGNOSIS — Z9889 Other specified postprocedural states: Secondary | ICD-10-CM | POA: Diagnosis not present

## 2022-10-15 ENCOUNTER — Telehealth: Payer: Self-pay | Admitting: Internal Medicine

## 2022-10-15 DIAGNOSIS — F3110 Bipolar disorder, current episode manic without psychotic features, unspecified: Secondary | ICD-10-CM

## 2022-10-15 NOTE — Telephone Encounter (Signed)
Patient contacted the office regarding psychiatry referral placed in February. Patient tried calling psychiatry office recently to get appt, was told that they did not have her referral. Likely because it had been so long since it was placed. Patient asked for this referral to be re submitted and was told by the office that Dr. Alphonsus Sias will need to specify that this referral is to help manage bipolar disorder. Please advise, thank you.

## 2022-10-17 ENCOUNTER — Encounter: Payer: Self-pay | Admitting: *Deleted

## 2022-10-17 NOTE — Telephone Encounter (Signed)
Referral sent to [facility] in [place].   Pt made aware via Mychart Letter and Mychart Messages

## 2022-11-04 NOTE — Telephone Encounter (Signed)
Patient has been trying to get in contact with his office and she's not been successful.She said that she has called over a dozen times and has gotten no answer.

## 2022-11-18 ENCOUNTER — Other Ambulatory Visit: Payer: Self-pay | Admitting: Internal Medicine

## 2022-12-01 ENCOUNTER — Encounter: Payer: Self-pay | Admitting: Physician Assistant

## 2022-12-01 ENCOUNTER — Ambulatory Visit (INDEPENDENT_AMBULATORY_CARE_PROVIDER_SITE_OTHER): Payer: Medicare HMO | Admitting: Physician Assistant

## 2022-12-01 VITALS — BP 170/67 | HR 59 | Ht 66.0 in | Wt 126.0 lb

## 2022-12-01 DIAGNOSIS — G3184 Mild cognitive impairment, so stated: Secondary | ICD-10-CM

## 2022-12-01 MED ORDER — MEMANTINE HCL 5 MG PO TABS: 5.0000 mg | ORAL_TABLET | Freq: Two times a day (BID) | ORAL | 11 refills | Status: DC

## 2022-12-01 NOTE — Progress Notes (Signed)
Assessment/Plan:   Mild cognitive impairment of multiple etiologies with vascular contribution  ZSOFIA TKACHENKO is a very pleasant 67 y.o. RH female with a history of hypertension, hyperlipidemia, DM2, anxiety with panic attacks, bipolar disorder, depression, CAD, fibromyalgia, history of CKD, hypothyroidism, rheumatoid arthritis, B12 deficiency, hepatomegaly and a diagnosis of MCI likely due to multiple etiologies with prominent psychiatric contribution (including GAD with panic attacks, BPD disorder, BL personality disorder, depression and psychiatric distress), remote opiate, presenting today in follow-up for evaluation of memory complaints. Patient was on memantine for a brief period of time, but has been discontinued upon discharge form hospital after her L clavicular fracture.  Her memory is stable according to family. MMSE today is 18/30. Family is concerned that she is "self destructive, and tries to behave that she has worse dementia that she has for attention" "She manipulates her own scores". I did ask the patient about this issue, she acknowledged that it is true. She has yet to see psychiatry, that certainly could be beneficial.   Follow up in  6 months. Resume memantine  at 5 mg twice daily Continue B12 supplementation Recommend obtaining psychiatric evaluation for the management of BP disorder MBL personality disorder, depression, psychiatric distress  Recommend good control of cardiovascular risk factors Consider increasing social activities     Subjective:   This patient is accompanied in the office by her daughter and granddaughter  who supplements the history. Previous records as well as any outside records available were reviewed prior to todays visit.   Patient was last seen on 06/02/2022.     Any changes in memory since last visit? " It is worse"-patient says, daughter reports that" is about the same".  She continues to have some difficulty remembering recent  information on normal conversations or names of people. Daughter reports that she does not do anything that is recommended to her, including psychiatry." She has been calling one place that she knows is not going to get an answer because the place is closed. She tells everyone that  "I got Alzheimer's dementia" -daughter says .  repeats oneself?  Endorsed Disoriented when walking into a room?  Patient denies except occasionally not remembering what patient came to the room for   Leaving objects in unusual places?  Her daughter reports that "she purposely will place for example, ice-cream in the fridge purposely to make it look like she has dementia ", to which patient agrees she did  Wandering behavior?   denies   Any personality changes since last visit?   denies   Any worsening depression?: denies   Hallucinations or paranoia?  She has a history of borderline paranoid schizophrenia.  She has been referred to a new psychiatrist and psychotherapist by her PCP.   Seizures?   denies    Any sleep changes?" Sleeps well till noon because she goes to sleep at 3 am".  Denies vivid dreams, REM behavior or sleepwalking   Sleep apnea? In the past she used CPAP, not currently   Any hygiene concerns? "He hygiene is poor" ." "I have to remind her to change her pads or she will let it get wet" Independent of bathing and dressing?  Endorsed.   Does the patient needs help with medications? Patient is in charge   Who is in charge of the finances?  Patient is in charge     Any changes in appetite?  denies     Patient have trouble swallowing?  denies   Does  the patient cook?  Any kitchen accidents such as leaving the stove on?   denies   Any headaches?    denies   Vision changes? denies Chronic pain?  denies   Ambulates with difficulty?    denies    Recent falls or head injuries?  She has a close displaced fracture at the clavicle on May 2024 requiring surgery (ORIF) after a mechanical fall, recuperating well  with PT.  Unilateral weakness, numbness or tingling?   denies   Any tremors?  "Only when nervous " Any anosmia?    denies   Any incontinence of urine?  History of urge incontinence, wears pads. Any bowel dysfunction? She has issues with chronic diarrhea Patient lives with daughter and granddaughter. Does the patient drive?  No longer drives   Personally reviewed the MRI of the brain July 2023 without  intracranial abnormality, tiny chronic cortical infarct within the posterior left frontal  lobe, new from the prior MRI of 06/02/2020., redemonstrated chronic lacunar infarct within the left corona radiata/internal capsule, chronic small vessel ischemic disease, stable and age advanced generalized parenchymal atrophy, also stable.    Neuropsych evaluation 05/20/2022 Briefly, results suggested a primary impairment surrounding both encoding (i.e., learning) and delayed retrieval aspects of verbal and visual memory. Additional weaknesses were exhibited across verbal fluency (i.e., both phonemic and semantic fluency), as well as recognition/consolidation aspects of memory. Notable performance variability was exhibited across processing speed, basic attention, and executive functioning. The etiology for ongoing cognitive dysfunction is most likely multifactorial. The likelihood for a prominent psychiatric contribution appears high. Historically, Ms. Bahar has several diagnoses, including major depressive disorder, generalized anxiety disorder with panic attacks, bipolar disorder, and borderline personality disorder. Across mood-related questionnaires, she reported acutely severe symptoms of both anxiety and depression. It is also worth noting that her current family dynamic may be exacerbating mood concerns. At the conclusion of the testing session, Ms. Markwell spontaneously reported to the psychometrist that she feels unable to say things and does not have an adequate voice when her daughter and granddaughter  are around. She alluded to this negatively impacting her mood and being a primary reason for why she may stay in her room throughout the day and try to avoid interaction. Severe psychiatric distress will certainly worsen cognitive abilities and the areas in which Ms. Deoliveira exhibited weakness and/or variability are certainly reasonable findings in this context. Remote opiate abuse and other substance use, as well as fibromyalgia and symptoms of chronic pain will also affect similar domains and may exacerbate difficulties. Neurologically speaking, Ms. Gudiel's most recent brain MRIs have revealed age advanced atrophy, as well as cerebrovascular disease and very small infarcts involving the left corona radiata/internal capsule and left posterior frontal lobe. Given this, there is certainly the potential for a vascular contribution to her current presentation given these findings. Current testing patterns would align with this presentation well.     Initial visit 10/23/2021  How long did patient have memory difficulties?  Patient reports that her memory changes have been present for about 1 year, getting harder to recall new information, new names.   Patient lives with: Daughter and granddaughter. repeats oneself?  "A lot "-repeats questions and comments. She is aware of what is going on. Daughter notes that she can asked the same questions 6 times in 2 hours.  Of note, the daughter reports that the patient has been doing research on dementia,  since her past testing was negative for date.  She has been  reading just about every pamphlet she can.  She feels like her mother is manipulating her diagnosis. Disoriented when walking into a room?  Patient denies   Leaving objects in unusual places?  Patient denies   Ambulates  with difficulty?  Her daughter states that the patient "may drag her feet to go to the grocery store, but if she has to take a check to the bank, then she runs ".   Recent falls?  Patient  denies   Any head injuries?  Patient denies   History of seizures?   Patient denies   Wandering behavior?  Patient denies   Patient drives?   Patient no longer drives. "I just don't drive" "she is not a good driver, she pays attention to everything but to the road "-daughter says    Any mood changes such irritability agitation?  "They may be having some problems with my mood "-daughter endorses " It is better with Wellbutrin, more alert and present ".  She has a history of bipolar disorder, but for the last 6 years she has not been on medication because she refuses to go to the doctor, and in the interim, "she expects people to do things for her at all times ", especially her daughter.  Any history of depression?:  Has a history of depression.  She does not like to interact with people, she has refused several times going to senior centers.  When the pamphlets arrive by mail, she hides them so no one can see them-daughter says. Hallucinations?  Patient denies   Paranoia?  The patient has had a diagnosis of borderline paranoid-schizophrenic (going to [facility])  Patient reports that he sleeps well without vivid dreams, REM behavior or sleepwalking    History of sleep apnea?  In the past she used in the past her CPAP, but that since her sinus surgery, she does not need it anymore. Any hygiene concerns?  Endorsed. She doesn't shower enough.  She has bed sores.  She "fakes "her showers especially during the last year Independent of bathing and dressing?  She her daughter has to help her Does the patient needs help with medications?  Daughter is in charge "as she was not doing it right "-daughter said..  Who is in charge of the finances is in charge Any changes in appetite?  Patient denies.  She was losing weight to Trulicity and gabapentin, she is off of those medications, and has increased her weight Patient have trouble swallowing? Patient denies  needs a new esophageal dilatation. Does the patient cook?   Patient denies   Any kitchen accidents such as leaving the stove on? Patient denies   Any headaches?  Patient denies   The double vision? Patient denies   Any focal numbness or tingling?  Patient denies   Chronic back pain Patient denies   Unilateral weakness?  Patient denies   Any tremors?  Patient denies  when nervous or tired or sugars low  Any history of anosmia?  Patient denies   Any incontinence of urine?  Needs diapers "hard to hold it if I had to go ".  Her daughter states that she feels up to the point that she has to go to the bathroom. Any bowel dysfunction?   She has chronic diarrhea.  She also takes Alka-Seltzer daily. History of heavy alcohol intake?  Patient denies   History of heavy tobacco use?  Patient denies     Pertinent labs March 2023 TSH normal 1.22, T4 0.99,  LFT normal, creatinine 1.4, CBC normal, LDL normal, triglycerides elevated 202   MRI of the brain February 2022 without acute or reversible finding, generalized brain atrophy, advanced for age, and further progressive since 2016.  Mild chronic small vessel change of the white matter.  No abnormalities in brainstem or cerebellum.  No hydrocephalus   MRI brain  10/2021 personally reviewed without  intracranial abnormality. 2. Tiny chronic cortical infarct within the posterior left frontal lobe, new from the prior MRI of 06/02/2020. 3. Redemonstrated chronic lacunar infarct within the left corona radiata/internal capsule. 4. Background mild cerebral white matter chronic small vessel ischemic disease, stable. 5. Age advanced generalized parenchymal atrophy, also stable.  Past Medical History:  Diagnosis Date   Abnormal urine 10/02/2021   Acquired hypothyroidism 11/26/2009   Last Assessment & Plan: TSH low when last checked. Advised follow up labs and to follow up with Dr. Elvera Lennox. The patient indicates understanding of these issues and agrees with the plan.   Atherosclerotic heart disease of native coronary artery  without angina pectoris 11/26/2009   Last Assessment & Plan: Quiet. On ASA, betablocker and statin.   Bipolar disorder, unspecified 11/26/2009   Last Assessment & Plan: Stable on current rx.   Borderline personality disorder    CAD (coronary artery disease)    multiple caths   Chronic pain syndrome 02/21/2010   Formatting of this note might be different from the original. Last Assessment & Plan: Followed by Dr. Metta Clines. Yes, I feel she can stop ASA 5 days prior if necessary to procedure. Form will be faxed back to him.   DDD (degenerative disc disease), lumbar 09/28/2014   Enlarged liver    Enlarged pituitary gland    Essential hypertension 11/26/2009   Fibromyalgia    Generalized anxiety disorder with panic attacks 11/26/2009   GERD (gastroesophageal reflux disease)    History of opioid abuse    Sober since 2016   History of suburethral sling procedure 12/13/2015   Hyperlipidemia due to type 2 diabetes mellitus 06/17/2017   Incomplete emptying of bladder 01/31/2016   Low back pain    Major depressive disorder    Mild cognitive impairment, multifactorial 05/20/2022   Multiple lacunar infarcts 11/03/2021   left corona radiata/internal capsule; posterior left frontal lobe.    Neck pain 08/14/2014   Nephrolithiasis    Osteoarthritis    Osteoporosis    Overactive detrusor 01/31/2016   Parkinsonism 2016   tremor; resolved after antipsychotics were discontinued; normal DaTscan   Personal history of colonic polyps 10/03/2011   2005 3 polyps - no records  2007 no polyps  10/03/2011-5 diminutive polyps, 4 recovered - adenomas      Proteinuria, unspecified 10/02/2021   Rheumatoid arthritis    Sacroiliac joint disease 09/24/2014   Scoliosis    Simple renal cyst 11/05/2021   Stage 3b chronic kidney disease 07/11/2020   SUI (stress urinary incontinence, female) 01/31/2016   Type 2 diabetes mellitus with diabetic polyneuropathy, without long-term current use of insulin 10/01/2011   Urinary  incontinence 12/13/2015   Venous insufficiency 06/03/2018     Past Surgical History:  Procedure Laterality Date   BLADDER SURGERY     ?bladder sling    CARDIAC CATHETERIZATION     x3   CATARACT EXTRACTION, BILATERAL     COLONOSCOPY  multiple   2005 [place] records not available-polyps x 3   COLONOSCOPY WITH PROPOFOL N/A 10/12/2015   Procedure: COLONOSCOPY WITH PROPOFOL;  Surgeon: Christena Deem, MD;  Location:  [facility] ENDOSCOPY;  Service: Endoscopy;  Laterality: N/A;   COLONOSCOPY WITH PROPOFOL N/A 04/29/2021   Procedure: COLONOSCOPY WITH PROPOFOL;  Surgeon: Regis Bill, MD;  Location: [facility] ENDOSCOPY;  Service: Endoscopy;  Laterality: N/A;  DM   ESOPHAGEAL DILATION     kidney stone removal     TONSILLECTOMY     VAGINAL HYSTERECTOMY       PREVIOUS MEDICATIONS:   CURRENT MEDICATIONS:  Outpatient Encounter Medications as of 12/01/2022  Medication Sig   acetaminophen (TYLENOL) 650 MG CR tablet Take 650 mg by mouth 3 (three) times daily as needed for pain.   buPROPion (WELLBUTRIN XL) 300 MG 24 hr tablet TAKE 1 TABLET(300 MG) BY MOUTH DAILY   diltiazem (CARDIZEM CD) 180 MG 24 hr capsule TAKE 1 CAPSULE(180 MG) BY MOUTH DAILY   divalproex (DEPAKOTE) 500 MG DR tablet TAKE 1 TABLET(500 MG) BY MOUTH TWICE DAILY   glipiZIDE (GLUCOTROL) 5 MG tablet TAKE 1 TABLET(5 MG) BY MOUTH DAILY BEFORE BREAKFAST   glucose blood (ONETOUCH VERIO) test strip Use to check blood sugar once a day. Dx Code E11.40   levothyroxine (SYNTHROID) 100 MCG tablet TAKE 1 TABLET BY MOUTH DAILY BEFORE BREAKFAST   lisinopril (ZESTRIL) 5 MG tablet TAKE 1 TABLET(5 MG) BY MOUTH DAILY   memantine (NAMENDA) 5 MG tablet Take 1 tablet (5 mg total) by mouth 2 (two) times daily.   metFORMIN (GLUCOPHAGE) 500 MG tablet TAKE 1 TABLET(500 MG) BY MOUTH TWICE DAILY WITH A MEAL   metoprolol tartrate (LOPRESSOR) 25 MG tablet Take 1 tablet (25 mg total) by mouth once for 1 dose. TWO HOURS PRIOR TO CARDIAC CTA   OneTouch Delica  Lancets 33G MISC 1 each by Does not apply route daily. Use to obtain blood sample for blood sugar once a day. Dx Code E11.40   pantoprazole (PROTONIX) 40 MG tablet TAKE 1 TABLET(40 MG) BY MOUTH DAILY   rosuvastatin (CRESTOR) 20 MG tablet TAKE 1 TABLET BY MOUTH DAILY   vitamin B-12 (CYANOCOBALAMIN) 500 MCG tablet Take 500 mcg by mouth at bedtime.    VITAMIN D PO Take by mouth.   [DISCONTINUED] TAZTIA XT 180 MG 24 hr capsule TAKE ONE CAPSULE BY MOUTH EVERY DAY   No facility-administered encounter medications on file as of 12/01/2022.     Objective:     PHYSICAL EXAMINATION:    VITALS:   Vitals:   12/01/22 1330 12/01/22 1440  BP: (!) 185/71 (!) 170/67  Pulse: (!) 59   SpO2: 100%   Weight: 126 lb (57.2 kg)   Height: 5\' 6"  (1.676 m)     GEN:  The patient appears stated age and is in NAD. HEENT:  Normocephalic, atraumatic.   Neurological examination:  General: NAD, disheveled appearance appears stated age. Orientation: The patient is alert. Oriented to person, place and not date Cranial nerves: There is good facial symmetry.The speech is fluent and clear. No aphasia or dysarthria. Fund of knowledge is appropriate. Recent memory impaired and remote memory is normal.  Attention and concentration are normal.  Able to name objects and repeat phrases.  Hearing is intact to conversational tone. Delayed recall 3/3 Sensation: Sensation is intact to light touch throughout Motor: Strength is at least antigravity x4. DTR's 2/4 in UE/LE      10/23/2021   12:00 PM 12/15/2014   11:19 AM  Montreal Cognitive Assessment   Visuospatial/ Executive (0/5) 3 3  Naming (0/3) 3 3  Attention: Read list of digits (0/2) 2 2  Attention: Read  list of letters (0/1) 1 1  Attention: Serial 7 subtraction starting at 100 (0/3) 3 3  Language: Repeat phrase (0/2) 2 2  Language : Fluency (0/1) 0 0  Abstraction (0/2) 2 0  Delayed Recall (0/5) 0 0  Orientation (0/6) 6 6  Total 22 20  Adjusted Score (based on  education) 23 21       12/01/2022    1:00 PM 10/02/2016   11:10 AM  MMSE - Mini Mental State Exam  Orientation to time 1 5  Orientation to Place 5 5  Registration 3 3  Attention/ Calculation 0 0  Recall 3 1  Recall-comments  pt was unable to recall 2 of 3 words  Language- name 2 objects 2 0  Language- repeat 1 1  Language- follow 3 step command 1 3  Language- read & follow direction 1 0  Write a sentence 1 0  Copy design 0 0  Total score 18 18       Movement examination: Tone: There is normal tone in the UE/LE Abnormal movements:  no tremor.  No myoclonus.  No asterixis.   Coordination:  There is no decremation with RAM's. Normal finger to nose  Gait and Station: The patient has no difficulty arising out of a deep-seated chair without the use of the hands. The patient's stride length is good.  Gait is cautious and narrow.   Thank you for allowing [place] the opportunity to participate in the care of this nice patient. Please do not hesitate to contact us for any questions or concerns.   Total time spent on today's visit was 34 minutes dedicated to this patient today, preparing to see patient, examining the patient, ordering tests and/or medications and counseling the patient, documenting clinical information in the EHR or other health record, independently interpreting results and communicating results to the patient/family, discussing treatment and goals, answering patient's questions and coordinating care.  Cc:  Karie Schwalbe, MD  Marlowe Kays 12/01/2022 4:39 PM

## 2022-12-01 NOTE — Patient Instructions (Signed)
It was a pleasure to see you today at our office.   Recommendations:   Recommend to establish care with a psychiatrist and behavioral therapist  [facility]end [facility] Memantine 5 mg twice daily.  Continue B12  Follow up in 6 months  Whom to call:  Memory  decline, memory medications: Call our office [phone]   For psychiatric meds, mood meds: Please have your primary care physician manage these medications.   Counseling regarding caregiver distress, including caregiver depression, anxiety and issues regarding community resources, adult day care programs, adult living facilities, or memory care questions:   Feel free to contact Misty Lisabeth Register, Social Worker at [phone]   For assessment of decision of mental capacity and competency:  Call Dr. Erick Blinks, geriatric psychiatrist at [phone]  For guidance in geriatric dementia issues please call [facility] [phone]  For guidance regarding [facility] and if placement were needed at the facility, contact Sidney Ace, Social Worker tel: [phone]  If you have any severe symptoms of a stroke, or other severe issues such as confusion,severe chills or fever, etc call 911 or go to the ER as you may need to be evaluated further     Consider [facility]  [address], [place] [address] [phone]  Hours of Operation Mondays to Thursdays: 8 am to 8 pm,Fridays: 9 am to 8 pm, Saturdays: 9 am to 1 pm Sundays: Closed  [email or web address]-[email or web address]    RECOMMENDATIONS FOR ALL PATIENTS WITH MEMORY PROBLEMS: 1. Continue to exercise (Recommend 30 minutes of walking everyday, or 3 hours every week) 2. Increase social interactions - continue going to [place] and enjoy social gatherings with friends and family 3. Eat healthy, avoid fried foods and eat more fruits and vegetables 4. Maintain  adequate blood pressure, blood sugar, and blood cholesterol level. Reducing the risk of stroke and cardiovascular disease also helps promoting better memory. 5. Avoid stressful situations. Live a simple life and avoid aggravations. Organize your time and prepare for the next day in anticipation. 6. Sleep well, avoid any interruptions of sleep and avoid any distractions in the bedroom that may interfere with adequate sleep quality 7. Avoid sugar, avoid sweets as there is a strong link between excessive sugar intake, diabetes, and cognitive impairment We discussed the Mediterranean diet, which has been shown to help patients reduce the risk of progressive memory disorders and reduces cardiovascular risk. This includes eating fish, eat fruits and green leafy vegetables, nuts like almonds and hazelnuts, walnuts, and also use olive oil. Avoid fast foods and fried foods as much as possible. Avoid sweets and sugar as sugar use has been linked to worsening of memory function.  There is always a concern of gradual progression of memory problems. If this is the case, then we may need to adjust level of care according to patient needs. Support, both to the patient and caregiver, should then be put into place.        FALL PRECAUTIONS: Be cautious when walking. Scan the area for obstacles that may increase the risk of trips and falls. When getting up in the mornings, sit up at the edge of the bed for a few minutes before getting out of bed. Consider elevating the bed at the head end to avoid drop of blood pressure when getting up. Walk always in a well-lit room (use night lights in the walls). Avoid area rugs or power cords from appliances in the middle of the walkways. Use a walker  or a cane if necessary and consider physical therapy for balance exercise. Get your eyesight checked regularly.  FINANCIAL OVERSIGHT: Supervision, especially oversight when making financial decisions or transactions is also  recommended.  HOME SAFETY: Consider the safety of the kitchen when operating appliances like stoves, microwave oven, and blender. Consider having supervision and share cooking responsibilities until no longer able to participate in those. Accidents with firearms and other hazards in the house should be identified and addressed as well.   ABILITY TO BE LEFT ALONE: If patient is unable to contact 911 operator, consider using LifeLine, or when the need is there, arrange for someone to stay with patients. Smoking is a fire hazard, consider supervision or cessation. Risk of wandering should be assessed by caregiver and if detected at any point, supervision and safe proof recommendations should be instituted.  MEDICATION SUPERVISION: Inability to self-administer medication needs to be constantly addressed. Implement a mechanism to ensure safe administration of the medications.   DRIVING: Regarding driving, in patients with progressive memory problems, driving will be impaired. We advise to have someone else do the driving if trouble finding directions or if minor accidents are reported. Independent driving assessment is available to determine safety of driving.   If you are interested in the driving assessment, you can contact the following:  [employer] in [place] [phone]  [facility] [phone]  [facility] [phone] or [phone]    Mediterranean Diet A Mediterranean diet refers to food and lifestyle choices that are based on the traditions of countries located on the [employer]. This way of eating has been shown to help prevent certain conditions and improve outcomes for people who have chronic diseases, like kidney disease and heart disease. What are tips for following this plan? Lifestyle  Cook and eat meals together with your family, when possible. Drink enough fluid to keep your urine clear  or pale yellow. Be physically active every day. This includes: Aerobic exercise like running or swimming. Leisure activities like gardening, walking, or housework. Get 7-8 hours of sleep each night. If recommended by your health care provider, drink red wine in moderation. This means 1 glass a day for nonpregnant women and 2 glasses a day for men. A glass of wine equals 5 oz (150 mL). Reading food labels  Check the serving size of packaged foods. For foods such as rice and pasta, the serving size refers to the amount of cooked product, not dry. Check the total fat in packaged foods. Avoid foods that have saturated fat or trans fats. Check the ingredients list for added sugars, such as corn syrup. Shopping  At the grocery store, buy most of your food from the areas near the walls of the store. This includes: Fresh fruits and vegetables (produce). Grains, beans, nuts, and seeds. Some of these may be available in unpackaged forms or large amounts (in bulk). Fresh seafood. Poultry and eggs. Low-fat dairy products. Buy whole ingredients instead of prepackaged foods. Buy fresh fruits and vegetables in-season from local farmers markets. Buy frozen fruits and vegetables in resealable bags. If you do not have access to quality fresh seafood, buy precooked frozen shrimp or canned fish, such as tuna, salmon, or sardines. Buy small amounts of raw or cooked vegetables, salads, or olives from the deli or salad bar at your store. Stock your pantry so you always have certain foods on hand, such as olive oil, canned tuna, canned tomatoes, rice, pasta, and beans.  Cooking  Cook foods with extra-virgin olive oil instead of using butter or other vegetable oils. Have meat as a side dish, and have vegetables or grains as your main dish. This means having meat in small portions or adding small amounts of meat to foods like pasta or stew. Use beans or vegetables instead of meat in common dishes like chili or  lasagna. Experiment with different cooking methods. Try roasting or broiling vegetables instead of steaming or sauteing them. Add frozen vegetables to soups, stews, pasta, or rice. Add nuts or seeds for added healthy fat at each meal. You can add these to yogurt, salads, or vegetable dishes. Marinate fish or vegetables using olive oil, lemon juice, garlic, and fresh herbs. Meal planning  Plan to eat 1 vegetarian meal one day each week. Try to work up to 2 vegetarian meals, if possible. Eat seafood 2 or more times a week. Have healthy snacks readily available, such as: Vegetable sticks with hummus. Greek yogurt. Fruit and nut trail mix. Eat balanced meals throughout the week. This includes: Fruit: 2-3 servings a day Vegetables: 4-5 servings a day Low-fat dairy: 2 servings a day Fish, poultry, or lean meat: 1 serving a day Beans and legumes: 2 or more servings a week Nuts and seeds: 1-2 servings a day Whole grains: 6-8 servings a day Extra-virgin olive oil: 3-4 servings a day Limit red meat and sweets to only a few servings a month What are my food choices? Mediterranean diet Recommended Grains: Whole-grain pasta. Brown rice. Bulgar wheat. Polenta. Couscous. Whole-wheat bread. Orpah Cobb. Vegetables: Artichokes. Beets. Broccoli. Cabbage. Carrots. Eggplant. Green beans. Chard. Kale. Spinach. Onions. Leeks. Peas. Squash. Tomatoes. Peppers. Radishes. Fruits: Apples. Apricots. Avocado. Berries. Bananas. Cherries. Dates. Figs. Grapes. Lemons. Melon. Oranges. Peaches. Plums. Pomegranate. Meats and other protein foods: Beans. Almonds. Sunflower seeds. Pine nuts. Peanuts. Cod. Salmon. Scallops. Shrimp. Tuna. Tilapia. Clams. Oysters. Eggs. Dairy: Low-fat milk. Cheese. Greek yogurt. Beverages: Water. Red wine. Herbal tea. Fats and oils: Extra virgin olive oil. Avocado oil. Grape seed oil. Sweets and desserts: [place] yogurt with honey. Baked apples. Poached pears. Trail mix. Seasoning and  other foods: Basil. Cilantro. Coriander. Cumin. Mint. Parsley. Sage. Rosemary. Tarragon. Garlic. Oregano. Thyme. Pepper. Balsalmic vinegar. Tahini. Hummus. Tomato sauce. Olives. Mushrooms. Limit these Grains: Prepackaged pasta or rice dishes. Prepackaged cereal with added sugar. Vegetables: Deep fried potatoes (french fries). Fruits: Fruit canned in syrup. Meats and other protein foods: Beef. Pork. Lamb. Poultry with skin. Hot dogs. Tomasa Blase. Dairy: Ice cream. Sour cream. Whole milk. Beverages: Juice. Sugar-sweetened soft drinks. Beer. Liquor and spirits. Fats and oils: Butter. Canola oil. Vegetable oil. Beef fat (tallow). Lard. Sweets and desserts: Cookies. Cakes. Pies. Candy. Seasoning and other foods: Mayonnaise. Premade sauces and marinades. The items listed may not be a complete list. Talk with your dietitian about what dietary choices are right for you. Summary The Mediterranean diet includes both food and lifestyle choices. Eat a variety of fresh fruits and vegetables, beans, nuts, seeds, and whole grains. Limit the amount of red meat and sweets that you eat. Talk with your health care provider about whether it is safe for you to drink red wine in moderation. This means 1 glass a day for nonpregnant women and 2 glasses a day for men. A glass of wine equals 5 oz (150 mL). This information is not intended to replace advice given to you by your health care provider. Make sure you discuss any questions you have with your health care provider. Document Released:  11/29/2015 Document Revised: 01/01/2016 Document Reviewed: 11/29/2015 Elsevier Interactive Patient Education  2017 [employer].

## 2022-12-04 ENCOUNTER — Ambulatory Visit: Payer: Medicare HMO | Admitting: Internal Medicine

## 2022-12-05 ENCOUNTER — Encounter: Payer: Self-pay | Admitting: Internal Medicine

## 2022-12-09 ENCOUNTER — Ambulatory Visit: Payer: Medicare HMO | Admitting: Internal Medicine

## 2022-12-19 NOTE — Telephone Encounter (Signed)
Pt is scheduled 02/10/2023 [facility]
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
## 2023-02-10 ENCOUNTER — Ambulatory Visit: Payer: Medicare HMO | Admitting: Psychiatry

## 2023-02-10 ENCOUNTER — Encounter: Payer: Self-pay | Admitting: Psychiatry

## 2023-02-10 VITALS — BP 161/72 | HR 53 | Temp 96.9°F | Ht 66.0 in | Wt 123.6 lb

## 2023-02-10 DIAGNOSIS — G3184 Mild cognitive impairment, so stated: Secondary | ICD-10-CM

## 2023-02-10 DIAGNOSIS — Z79899 Other long term (current) drug therapy: Secondary | ICD-10-CM | POA: Diagnosis not present

## 2023-02-10 DIAGNOSIS — F4310 Post-traumatic stress disorder, unspecified: Secondary | ICD-10-CM

## 2023-02-10 DIAGNOSIS — F3131 Bipolar disorder, current episode depressed, mild: Secondary | ICD-10-CM

## 2023-02-10 NOTE — Patient Instructions (Signed)
Continue Depakote 1500 mg at night  Continue bupropion 300 mg daily  Obtain lab (CBC, CMP, VPA, TSH, fT4) Referral for OT Next appointment- 12/30 at 1:30

## 2023-02-11 ENCOUNTER — Other Ambulatory Visit: Payer: Self-pay | Admitting: Psychiatry

## 2023-02-11 ENCOUNTER — Telehealth: Payer: Self-pay

## 2023-02-11 ENCOUNTER — Encounter: Payer: Self-pay | Admitting: Psychiatry

## 2023-02-11 LAB — COMPREHENSIVE METABOLIC PANEL
ALT: 15 [IU]/L (ref 0–32)
AST: 15 [IU]/L (ref 0–40)
Albumin: 4.5 g/dL (ref 3.9–4.9)
Alkaline Phosphatase: 78 [IU]/L (ref 44–121)
BUN/Creatinine Ratio: 27 (ref 12–28)
BUN: 34 mg/dL — ABNORMAL HIGH (ref 8–27)
Bilirubin Total: 0.2 mg/dL (ref 0.0–1.2)
CO2: 19 mmol/L — ABNORMAL LOW (ref 20–29)
Calcium: 10.4 mg/dL — ABNORMAL HIGH (ref 8.7–10.3)
Chloride: 107 mmol/L — ABNORMAL HIGH (ref 96–106)
Creatinine, Ser: 1.26 mg/dL — ABNORMAL HIGH (ref 0.57–1.00)
Globulin, Total: 2.3 g/dL (ref 1.5–4.5)
Glucose: 118 mg/dL — ABNORMAL HIGH (ref 70–99)
Potassium: 5.3 mmol/L — ABNORMAL HIGH (ref 3.5–5.2)
Sodium: 144 mmol/L (ref 134–144)
Total Protein: 6.8 g/dL (ref 6.0–8.5)
eGFR: 46 mL/min/{1.73_m2} — ABNORMAL LOW (ref >59)

## 2023-02-11 LAB — CBC
Hematocrit: 40.5 % (ref 34.0–46.6)
Hemoglobin: 12.9 g/dL (ref 11.1–15.9)
MCH: 30.9 pg (ref 26.6–33.0)
MCHC: 31.9 g/dL (ref 31.5–35.7)
MCV: 97 fL (ref 79–97)
Platelets: 214 10*3/uL (ref 150–450)
RBC: 4.18 x10E6/uL (ref 3.77–5.28)
RDW: 12.4 % (ref 11.7–15.4)
WBC: 7.3 10*3/uL (ref 3.4–10.8)

## 2023-02-11 LAB — TSH: TSH: 0.481 u[IU]/mL (ref 0.450–4.500)

## 2023-02-11 LAB — VALPROIC ACID LEVEL: Valproic Acid Lvl: 85 ug/mL (ref 50–100)

## 2023-02-11 LAB — T4, FREE: Free T4: 1.93 ng/dL — ABNORMAL HIGH (ref 0.82–1.77)

## 2023-02-11 MED ORDER — SERTRALINE HCL 25 MG PO TABS: 25.0000 mg | ORAL_TABLET | Freq: Every day | ORAL | 1 refills | Status: AC

## 2023-02-11 NOTE — Telephone Encounter (Signed)
left message requesting patient call office back in regards to her labwork

## 2023-02-11 NOTE — Telephone Encounter (Signed)
-----   Message from Elliott sent at 02/11/2023 10:17 AM EDT ----- Could you please contact the patient regarding her blood test results?  - Depakote levels are within a good range, so I would advise staying on the current dose. - BUN/creatinine and potassium are higher than normal. I recommend she follow up with her primary care provider for further evaluation. - Free T4 is also higher than normal, and she should follow up with her primary care provider regarding this as well. - I've sent a prescription for sertraline 25 mg at night to the pharmacy. Please advise her to start this medication as prescribed.

## 2023-02-11 NOTE — Progress Notes (Signed)
Called patient as instructed by provider no answer left voicemail for patient to return call to office

## 2023-02-11 NOTE — Progress Notes (Signed)
Could you please contact the patient regarding her blood test results?  - Depakote levels are within a good range, so I would advise staying on the current dose. - BUN/creatinine and potassium are higher than normal. I recommend she follow up with her primary care provider for further evaluation. - Free T4 is also higher than normal, and she should follow up with her primary care provider regarding this as well. - I've sent a prescription for sertraline 25 mg at night to the pharmacy. Please advise her to start this medication as prescribed.

## 2023-02-12 NOTE — Progress Notes (Signed)
Called patient as instructed by provider to make aware of test results no answer left voicemail for patient to return call to office

## 2023-02-13 NOTE — Progress Notes (Signed)
Called patient again after several attempts no answer left voicemail for patient to return call to office

## 2023-02-18 ENCOUNTER — Other Ambulatory Visit: Payer: Self-pay | Admitting: Internal Medicine

## 2023-02-26 ENCOUNTER — Ambulatory Visit: Payer: Medicare HMO | Admitting: Internal Medicine

## 2023-02-26 ENCOUNTER — Encounter: Payer: Self-pay | Admitting: Internal Medicine

## 2023-02-26 VITALS — BP 102/70 | HR 57 | Temp 98.3°F | Ht 65.0 in | Wt 124.0 lb

## 2023-02-26 DIAGNOSIS — E1142 Type 2 diabetes mellitus with diabetic polyneuropathy: Secondary | ICD-10-CM | POA: Diagnosis not present

## 2023-02-26 DIAGNOSIS — F3341 Major depressive disorder, recurrent, in partial remission: Secondary | ICD-10-CM

## 2023-02-26 DIAGNOSIS — N1832 Chronic kidney disease, stage 3b: Secondary | ICD-10-CM | POA: Diagnosis not present

## 2023-02-26 DIAGNOSIS — I2510 Atherosclerotic heart disease of native coronary artery without angina pectoris: Secondary | ICD-10-CM

## 2023-02-26 DIAGNOSIS — Z7984 Long term (current) use of oral hypoglycemic drugs: Secondary | ICD-10-CM

## 2023-02-26 DIAGNOSIS — G3184 Mild cognitive impairment, so stated: Secondary | ICD-10-CM

## 2023-02-26 DIAGNOSIS — Z0000 Encounter for general adult medical examination without abnormal findings: Secondary | ICD-10-CM | POA: Diagnosis not present

## 2023-02-26 LAB — POCT GLYCOSYLATED HEMOGLOBIN (HGB A1C): Hemoglobin A1C: 7.3 % — AB (ref 4.0–5.6)

## 2023-02-26 NOTE — Assessment & Plan Note (Signed)
I have personally reviewed the Medicare Annual Wellness questionnaire and have noted 1. The patient's medical and social history 2. Their use of alcohol, tobacco or illicit drugs 3. Their current medications and supplements 4. The patient's functional ability including ADL's, fall risks, home safety risks and hearing or visual             impairment. 5. Diet and physical activities 6. Evidence for depression or mood disorders  The patients weight, height, BMI and visual acuity have been recorded in the chart I have made referrals, counseling and provided education to the patient based review of the above and I have provided the pt with a written personalized care plan for preventive services.  I have provided you with a copy of your personalized plan for preventive services. Please take the time to review along with your updated medication list.  Overdue for mammogram--she needs to set this up Colon due 2030 No pap due to age Prefers no flu/COVID vaccines Td at pharmacy Needs to try at least some resistance exercise

## 2023-02-26 NOTE — Assessment & Plan Note (Signed)
No angina On diltiazem 180, lisinopriol 5, metoprolol 25, rosuvastatin

## 2023-02-26 NOTE — Assessment & Plan Note (Signed)
Last GFR slightly better Is on low dose lisinopril

## 2023-02-26 NOTE — Addendum Note (Signed)
Addended by: Eual Fines on: 02/26/2023 03:09 PM   Modules accepted: Orders

## 2023-02-26 NOTE — Assessment & Plan Note (Signed)
Mood and cognition better since on sertraline 25. Bupropion 300 daily and depakote 500 bid (as mood stabilizer)

## 2023-02-26 NOTE — Assessment & Plan Note (Addendum)
A1c stable at 7.3% On metformin 500 bid Neuropathy seems better

## 2023-02-26 NOTE — Progress Notes (Signed)
Vision Screening   Right eye Left eye Both eyes  Without correction 20/13 20/25 20/13   With correction     Hearing Screening - Comments:: Passed whisper test

## 2023-02-26 NOTE — Progress Notes (Signed)
Subjective:    Patient ID: Lori Phillips, female    DOB: Jul 31, 1955, 67 y.o.   MRN: [id number]  HPI Here with daughter for Medicare wellness visit and follow up of chronic health condtions Reviewed advanced directives Reviewed other doctors--Ms Wertman--neurology, Dr Hisada---psychiatry, Dr Martyn Malay, Dr AGbor-Etang--cardiology, Dr Merz--neuropsych, Dr Scharlene Gloss, Dr Stormy Card Has surgical repair of fractured right clavicle. No other surgery. No hospitalizations Vision is not great--going for exam Hearing is okay Rare alcohol--like glass of wine No tobacco Several falls--had the fractured clavicle once Not really exercising. Does go to chicken coop and up some stairs---not much. Walks with cane except in house  Daughter and granddaughter live with her She doesn't drive Does laundry and dishes.  Feeds the animals--1 dog, 1 rabbit, 3 gineau pids, 1 ferret and lots of cats (21?)---mostly outside Independent with instrumental ADLs  Not checking sugars No sig burning or tingling in feet recently  Has mild cognitive impairment Recently started with psychiatrist Daughter thinks she is better and more engaged since starting sertraline Mood is better No persistent depression and not anhedonic  No chest pain No SOB No dizziness or syncope No sig edema--but does have some blue discoloration in feet  Reviewed recent labs GFR 46  Current Outpatient Medications on File Prior to Visit  Medication Sig Dispense Refill   acetaminophen (TYLENOL) 650 MG CR tablet Take 650 mg by mouth 3 (three) times daily as needed for pain.     buPROPion (WELLBUTRIN XL) 300 MG 24 hr tablet TAKE 1 TABLET(300 MG) BY MOUTH DAILY 90 tablet 3   diltiazem (CARDIZEM CD) 180 MG 24 hr capsule TAKE 1 CAPSULE(180 MG) BY MOUTH DAILY 90 capsule 3   divalproex (DEPAKOTE) 500 MG DR tablet TAKE 1 TABLET(500 MG) BY MOUTH TWICE DAILY 180 tablet 3   glipiZIDE (GLUCOTROL) 5 MG tablet TAKE 1 TABLET(5 MG) BY  MOUTH DAILY BEFORE BREAKFAST 90 tablet 3   glucose blood (ONETOUCH VERIO) test strip Use to check blood sugar once a day. Dx Code E11.40 100 each 4   levothyroxine (SYNTHROID) 100 MCG tablet TAKE 1 TABLET BY MOUTH DAILY BEFORE BREAKFAST 90 tablet 3   lisinopril (ZESTRIL) 5 MG tablet TAKE 1 TABLET(5 MG) BY MOUTH DAILY 90 tablet 3   memantine (NAMENDA) 5 MG tablet Take 1 tablet (5 mg total) by mouth 2 (two) times daily. 60 tablet 11   metFORMIN (GLUCOPHAGE) 500 MG tablet TAKE 1 TABLET(500 MG) BY MOUTH TWICE DAILY WITH A MEAL 180 tablet 3   OneTouch Delica Lancets 33G MISC 1 each by Does not apply route daily. Use to obtain blood sample for blood sugar once a day. Dx Code E11.40 100 each 3   pantoprazole (PROTONIX) 40 MG tablet TAKE 1 TABLET(40 MG) BY MOUTH DAILY 90 tablet 3   rosuvastatin (CRESTOR) 20 MG tablet TAKE 1 TABLET BY MOUTH DAILY 90 tablet 3   sertraline (ZOLOFT) 25 MG tablet Take 1 tablet (25 mg total) by mouth at bedtime. 30 tablet 1   vitamin B-12 (CYANOCOBALAMIN) 500 MCG tablet Take 500 mcg by mouth at bedtime.      VITAMIN D PO Take by mouth.     [DISCONTINUED] TAZTIA XT 180 MG 24 hr capsule TAKE ONE CAPSULE BY MOUTH EVERY DAY 90 capsule 0   No current facility-administered medications on file prior to visit.    Allergies  Allergen Reactions   Glyburide Other (See Comments)    Reaction:  Dizziness and confusion    Seroquel [Quetiapine Fumarate]  Diarrhea, Nausea And Vomiting and Other (See Comments)    Reaction:  Confusion    Byetta 10 Mcg Pen [Exenatide] Nausea And Vomiting    Other reaction(s): Nausea And Vomiting    Past Medical History:  Diagnosis Date   Abnormal urine 10/02/2021   Acquired hypothyroidism 11/26/2009   Last Assessment & Plan: TSH low when last checked. Advised follow up labs and to follow up with Dr. Elvera Lennox. The patient indicates understanding of these issues and agrees with the plan.   Atherosclerotic heart disease of native coronary artery without  angina pectoris 11/26/2009   Last Assessment & Plan: Quiet. On ASA, betablocker and statin.   Bipolar disorder, unspecified 11/26/2009   Last Assessment & Plan: Stable on current rx.   Borderline personality disorder    CAD (coronary artery disease)    multiple caths   Chronic pain syndrome 02/21/2010   Formatting of this note might be different from the original. Last Assessment & Plan: Followed by Dr. Metta Clines. Yes, I feel she can stop ASA 5 days prior if necessary to procedure. Form will be faxed back to him.   DDD (degenerative disc disease), lumbar 09/28/2014   Enlarged liver    Enlarged pituitary gland    Essential hypertension 11/26/2009   Fibromyalgia    Generalized anxiety disorder with panic attacks 11/26/2009   GERD (gastroesophageal reflux disease)    History of opioid abuse    Sober since 2016   History of suburethral sling procedure 12/13/2015   Hyperlipidemia due to type 2 diabetes mellitus 06/17/2017   Incomplete emptying of bladder 01/31/2016   Low back pain    Major depressive disorder    Mild cognitive impairment, multifactorial 05/20/2022   Multiple lacunar infarcts 11/03/2021   left corona radiata/internal capsule; posterior left frontal lobe.    Neck pain 08/14/2014   Nephrolithiasis    Osteoarthritis    Osteoporosis    Overactive detrusor 01/31/2016   Parkinsonism (HCC) 2016   tremor; resolved after antipsychotics were discontinued; normal DaTscan   Personal history of colonic polyps 10/03/2011   2005 3 polyps - no records  2007 no polyps  10/03/2011-5 diminutive polyps, 4 recovered - adenomas      Proteinuria, unspecified 10/02/2021   Rheumatoid arthritis    Sacroiliac joint disease 09/24/2014   Scoliosis    Simple renal cyst 11/05/2021   Stage 3b chronic kidney disease 07/11/2020   SUI (stress urinary incontinence, female) 01/31/2016   Type 2 diabetes mellitus with diabetic polyneuropathy, without long-term current use of insulin 10/01/2011   Urinary  incontinence 12/13/2015   Venous insufficiency 06/03/2018    Past Surgical History:  Procedure Laterality Date   BLADDER SURGERY     ?bladder sling    CARDIAC CATHETERIZATION     x3   CATARACT EXTRACTION, BILATERAL     COLONOSCOPY  multiple   2005 [place] not available-polyps x 3   COLONOSCOPY WITH PROPOFOL N/A 10/12/2015   Procedure: COLONOSCOPY WITH PROPOFOL;  Surgeon: Christena Deem, MD;  Location: [facility] ENDOSCOPY;  Service: Endoscopy;  Laterality: N/A;   COLONOSCOPY WITH PROPOFOL N/A 04/29/2021   Procedure: COLONOSCOPY WITH PROPOFOL;  Surgeon: Regis Bill, MD;  Location: [facility] ENDOSCOPY;  Service: Endoscopy;  Laterality: N/A;  DM   ESOPHAGEAL DILATION     kidney stone removal     Right Clavicle repair  08/20/2022   TONSILLECTOMY     VAGINAL HYSTERECTOMY      Family History  Problem Relation Age of Onset  Alcohol abuse Mother    Depression Mother    Hypertension Mother    Diabetes Mother    Hypertension Father    Colon cancer Paternal Aunt    Colon cancer Paternal Grandmother    Thyroid disease Neg Hx    Breast cancer Neg Hx     Social History   Socioeconomic History   Marital status: Widowed    Spouse name: Not on file   Number of children: 3   Years of education: 12   Highest education level: High school graduate  Occupational History   Occupation: Disability    Comment: Personnel officer [employer]  Tobacco Use   Smoking status: Never    Passive exposure: Current   Smokeless tobacco: Never  Vaping Use   Vaping status: Never Used  Substance and Sexual Activity   Alcohol use: Yes    Comment: very rare   Drug use: Not Currently    Types: Cocaine, Marijuana    Comment: hx of opiate abuse until 2016. Prior use of cocaine and MJ over years   Sexual activity: Not Currently  Other Topics Concern   Not on file  Social History Narrative   Daughter and granddaughter live with her      Has a living will- desires CPR but no  prolonged life support if futile.   Daughter Hospital doctor is health care POA   No tube feeds if cognitively unaware   Right handed   Drinks caffein prn   Social Determinants of Health   Financial Resource Strain: Not on file  Food Insecurity: Not on file  Transportation Needs: Not on file  Physical Activity: Not on file  Stress: Not on file  Social Connections: Not on file  Intimate Partner Violence: Not on file   Review of Systems Appetite is good Weight is up slightly Sleeps okay Wears seat belt No teeth No heartburn or dysphagia Bowels move okay---loose at times. No blood Chronic urinary incontinence---wears pad Has fairly continuous back pain--tylenol not much help    Objective:   Physical Exam Constitutional:      Appearance: Normal appearance.  HENT:     Mouth/Throat:     Pharynx: No oropharyngeal exudate or posterior oropharyngeal erythema.  Eyes:     Conjunctiva/sclera: Conjunctivae normal.     Pupils: Pupils are equal, round, and reactive to light.  Cardiovascular:     Rate and Rhythm: Normal rate and regular rhythm.     Pulses: Normal pulses.     Heart sounds: No murmur heard.    No gallop.  Pulmonary:     Effort: Pulmonary effort is normal.     Breath sounds: Normal breath sounds. No wheezing or rales.  Abdominal:     Palpations: Abdomen is soft.     Tenderness: There is no abdominal tenderness.  Musculoskeletal:     Cervical back: Neck supple.     Right lower leg: No edema.     Left lower leg: No edema.  Lymphadenopathy:     Cervical: No cervical adenopathy.  Skin:    Findings: No rash.     Comments: Mild reddish discoloration in feet/toes---but circulation is fine  Neurological:     General: No focal deficit present.     Mental Status: She is alert and oriented to person, place, and time.     Comments: Fairly normal sensation in feet  Psychiatric:        Mood and Affect: Mood normal.        Behavior:  Behavior normal.            Assessment &  Plan:

## 2023-02-26 NOTE — Assessment & Plan Note (Signed)
Seeing neurology about this Is on memantine 5 bid

## 2023-03-13 ENCOUNTER — Other Ambulatory Visit: Payer: Self-pay | Admitting: Internal Medicine

## 2023-03-13 DIAGNOSIS — E1169 Type 2 diabetes mellitus with other specified complication: Secondary | ICD-10-CM

## 2023-03-13 DIAGNOSIS — E785 Hyperlipidemia, unspecified: Secondary | ICD-10-CM

## 2023-04-18 ENCOUNTER — Encounter: Payer: Self-pay | Admitting: Physician Assistant

## 2023-04-20 ENCOUNTER — Ambulatory Visit (INDEPENDENT_AMBULATORY_CARE_PROVIDER_SITE_OTHER): Payer: Medicare HMO | Admitting: Psychiatry

## 2023-04-20 DIAGNOSIS — Z91199 Patient's noncompliance with other medical treatment and regimen due to unspecified reason: Secondary | ICD-10-CM
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
## 2023-04-22 NOTE — Telephone Encounter (Signed)
If she has a stroke she has to go to the ER, we do not order scans per request. Thanks

## 2023-04-22 DEATH — deceased

## 2023-04-23 ENCOUNTER — Telehealth: Payer: Self-pay | Admitting: Internal Medicine

## 2023-04-23 NOTE — Telephone Encounter (Signed)
 Copied from CRM [id number]. Topic: General - Deceased Patient >> May 04, 2023 12:51 PM Rolin D wrote: Name of caller: Jeoffrey Chiles  Date of death: 05/01/2023   Name of funeral home: n/a  Phone number of funeral home: n/a  Provider that needs to sign form: n/a  Timeline for signing: n/a

## 2023-04-23 NOTE — Telephone Encounter (Signed)
 I had been notified by an ER nurse that she committed suicide. I will try to touch base with her daughter next week

## 2023-04-27 ENCOUNTER — Telehealth: Payer: Self-pay | Admitting: *Deleted

## 2023-04-27 NOTE — Telephone Encounter (Deleted)
 Copied from CRM [id number]. Topic: General - Other >> Apr 27, 2023  2:03 PM Denese Killings wrote: Reason for CRM: Patient daughter is returning a call from Dr. Alphonsus Sias to confirm mother's passing.

## 2023-04-27 NOTE — Telephone Encounter (Signed)
 Copied from CRM [id number]. Topic: General - Other >> Apr 27, 2023  2:03 PM Denese Killings wrote: Reason for CRM: Patient daughter is returning a call from Dr. Alphonsus Sias to confirm mother's passing.
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
## 2023-06-01 ENCOUNTER — Telehealth: Payer: Self-pay | Admitting: Physician Assistant

## 2023-06-01 ENCOUNTER — Telehealth: Payer: Self-pay | Admitting: Internal Medicine

## 2023-06-01 NOTE — Telephone Encounter (Signed)
 Amber notified of Dr. Forbes Ida response.
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
## 2023-06-01 NOTE — Telephone Encounter (Signed)
 Daughter of previously seen Pt. (Who is deceased) has questions about her previous care and conversations had with Oakley Bellman. Call her back at [phone]

## 2023-06-03 NOTE — Telephone Encounter (Signed)
I left message to call office, tried to call as well.

## 2023-06-04 ENCOUNTER — Ambulatory Visit: Payer: PPO | Admitting: Physician Assistant

## 2023-06-04 NOTE — Telephone Encounter (Signed)
I have tried to call and left message again, if she calls back. I can talk to her.

## 2023-08-27 ENCOUNTER — Ambulatory Visit: Payer: Medicare HMO | Admitting: Internal Medicine
